# Patient Record
Sex: Male | Born: 1935 | Race: White | Hispanic: No | Marital: Married | Smoking: Former smoker
Health system: Southern US, Community
[De-identification: ages and names within clinical notes are randomized; demographics above are authoritative.]

## PROBLEM LIST (undated history)

## (undated) DIAGNOSIS — F418 Other specified anxiety disorders: Secondary | ICD-10-CM

## (undated) DIAGNOSIS — H9190 Unspecified hearing loss, unspecified ear: Secondary | ICD-10-CM

## (undated) DIAGNOSIS — F410 Panic disorder [episodic paroxysmal anxiety] without agoraphobia: Secondary | ICD-10-CM

## (undated) DIAGNOSIS — K922 Gastrointestinal hemorrhage, unspecified: Secondary | ICD-10-CM

## (undated) DIAGNOSIS — F1020 Alcohol dependence, uncomplicated: Secondary | ICD-10-CM

## (undated) DIAGNOSIS — I2510 Atherosclerotic heart disease of native coronary artery without angina pectoris: Secondary | ICD-10-CM

## (undated) DIAGNOSIS — K315 Obstruction of duodenum: Secondary | ICD-10-CM

## (undated) DIAGNOSIS — I719 Aortic aneurysm of unspecified site, without rupture: Secondary | ICD-10-CM

## (undated) DIAGNOSIS — K5730 Diverticulosis of large intestine without perforation or abscess without bleeding: Secondary | ICD-10-CM

## (undated) DIAGNOSIS — M1990 Unspecified osteoarthritis, unspecified site: Secondary | ICD-10-CM

## (undated) DIAGNOSIS — K219 Gastro-esophageal reflux disease without esophagitis: Secondary | ICD-10-CM

## (undated) DIAGNOSIS — L570 Actinic keratosis: Secondary | ICD-10-CM

## (undated) DIAGNOSIS — N200 Calculus of kidney: Secondary | ICD-10-CM

## (undated) DIAGNOSIS — D126 Benign neoplasm of colon, unspecified: Secondary | ICD-10-CM

## (undated) DIAGNOSIS — F419 Anxiety disorder, unspecified: Secondary | ICD-10-CM

## (undated) DIAGNOSIS — I714 Abdominal aortic aneurysm, without rupture, unspecified: Secondary | ICD-10-CM

## (undated) DIAGNOSIS — I219 Acute myocardial infarction, unspecified: Secondary | ICD-10-CM

## (undated) DIAGNOSIS — R760 Raised antibody titer: Secondary | ICD-10-CM

## (undated) DIAGNOSIS — R571 Hypovolemic shock: Secondary | ICD-10-CM

## (undated) DIAGNOSIS — I2109 ST elevation (STEMI) myocardial infarction involving other coronary artery of anterior wall: Secondary | ICD-10-CM

## (undated) DIAGNOSIS — I5020 Unspecified systolic (congestive) heart failure: Secondary | ICD-10-CM

## (undated) DIAGNOSIS — K2970 Gastritis, unspecified, without bleeding: Secondary | ICD-10-CM

## (undated) DIAGNOSIS — E785 Hyperlipidemia, unspecified: Secondary | ICD-10-CM

## (undated) DIAGNOSIS — I10 Essential (primary) hypertension: Secondary | ICD-10-CM

## (undated) HISTORY — DX: Obstruction of duodenum: K31.5

## (undated) HISTORY — DX: Essential (primary) hypertension: I10

## (undated) HISTORY — PX: CORONARY ANGIOPLASTY: SHX604

## (undated) HISTORY — DX: Gastritis, unspecified, without bleeding: K29.70

## (undated) HISTORY — DX: ST elevation (STEMI) myocardial infarction involving other coronary artery of anterior wall: I21.09

## (undated) HISTORY — DX: Alcohol dependence, uncomplicated: F10.20

## (undated) HISTORY — DX: Unspecified osteoarthritis, unspecified site: M19.90

## (undated) HISTORY — DX: Acute myocardial infarction, unspecified: I21.9

## (undated) HISTORY — DX: Anxiety disorder, unspecified: F41.9

## (undated) HISTORY — DX: Actinic keratosis: L57.0

## (undated) HISTORY — DX: Hyperlipidemia, unspecified: E78.5

## (undated) HISTORY — DX: Abdominal aortic aneurysm, without rupture: I71.4

## (undated) HISTORY — DX: Hypovolemic shock: R57.1

## (undated) HISTORY — DX: Calculus of kidney: N20.0

## (undated) HISTORY — DX: Gastrointestinal hemorrhage, unspecified: K92.2

## (undated) HISTORY — DX: Abdominal aortic aneurysm, without rupture, unspecified: I71.40

---

## 2006-07-24 ENCOUNTER — Ambulatory Visit: Payer: Self-pay | Admitting: Gastroenterology

## 2007-10-13 ENCOUNTER — Other Ambulatory Visit: Payer: Self-pay

## 2007-10-13 ENCOUNTER — Inpatient Hospital Stay: Payer: Self-pay | Admitting: Internal Medicine

## 2008-08-20 ENCOUNTER — Inpatient Hospital Stay: Payer: Self-pay | Admitting: Psychiatry

## 2009-10-13 DIAGNOSIS — K5730 Diverticulosis of large intestine without perforation or abscess without bleeding: Secondary | ICD-10-CM

## 2009-10-13 HISTORY — DX: Diverticulosis of large intestine without perforation or abscess without bleeding: K57.30

## 2009-10-18 ENCOUNTER — Ambulatory Visit: Payer: Self-pay | Admitting: Gastroenterology

## 2011-04-13 ENCOUNTER — Inpatient Hospital Stay: Payer: Self-pay | Admitting: Psychiatry

## 2011-08-09 ENCOUNTER — Inpatient Hospital Stay: Payer: Self-pay | Admitting: Psychiatry

## 2011-08-15 ENCOUNTER — Ambulatory Visit: Payer: Self-pay | Admitting: Unknown Physician Specialty

## 2011-09-14 ENCOUNTER — Ambulatory Visit: Payer: Self-pay | Admitting: Unknown Physician Specialty

## 2011-12-13 ENCOUNTER — Ambulatory Visit: Payer: Self-pay

## 2012-01-30 ENCOUNTER — Encounter: Payer: Self-pay | Admitting: Internal Medicine

## 2012-02-15 ENCOUNTER — Encounter: Payer: Self-pay | Admitting: Internal Medicine

## 2012-02-16 ENCOUNTER — Encounter: Payer: Self-pay | Admitting: Internal Medicine

## 2012-02-21 ENCOUNTER — Ambulatory Visit (INDEPENDENT_AMBULATORY_CARE_PROVIDER_SITE_OTHER): Payer: Medicare Other | Admitting: Internal Medicine

## 2012-02-21 ENCOUNTER — Encounter: Payer: Self-pay | Admitting: Internal Medicine

## 2012-02-21 VITALS — BP 110/72 | HR 110 | Ht 68.0 in | Wt 149.0 lb

## 2012-02-21 DIAGNOSIS — I129 Hypertensive chronic kidney disease with stage 1 through stage 4 chronic kidney disease, or unspecified chronic kidney disease: Secondary | ICD-10-CM | POA: Insufficient documentation

## 2012-02-21 DIAGNOSIS — E7800 Pure hypercholesterolemia, unspecified: Secondary | ICD-10-CM | POA: Insufficient documentation

## 2012-02-21 DIAGNOSIS — R1013 Epigastric pain: Secondary | ICD-10-CM

## 2012-02-21 DIAGNOSIS — Z86010 Personal history of colonic polyps: Secondary | ICD-10-CM

## 2012-02-21 DIAGNOSIS — N200 Calculus of kidney: Secondary | ICD-10-CM | POA: Insufficient documentation

## 2012-02-21 DIAGNOSIS — F329 Major depressive disorder, single episode, unspecified: Secondary | ICD-10-CM | POA: Insufficient documentation

## 2012-02-21 DIAGNOSIS — F419 Anxiety disorder, unspecified: Secondary | ICD-10-CM | POA: Insufficient documentation

## 2012-02-21 DIAGNOSIS — F32A Depression, unspecified: Secondary | ICD-10-CM | POA: Insufficient documentation

## 2012-02-21 DIAGNOSIS — K3189 Other diseases of stomach and duodenum: Secondary | ICD-10-CM

## 2012-02-21 DIAGNOSIS — K219 Gastro-esophageal reflux disease without esophagitis: Secondary | ICD-10-CM | POA: Insufficient documentation

## 2012-02-21 DIAGNOSIS — A048 Other specified bacterial intestinal infections: Secondary | ICD-10-CM | POA: Insufficient documentation

## 2012-02-21 MED ORDER — OMEPRAZOLE 20 MG PO CPDR: 20.0000 mg | DELAYED_RELEASE_CAPSULE | Freq: Every day | ORAL | Status: DC

## 2012-02-21 NOTE — Patient Instructions (Signed)
You have been scheduled for a colonoscopy with propofol. Please follow written instructions given to you at your visit today.  Please pick up your prep kit at the pharmacy within the next 1-3 days.  Once you have completed your H.Pylori therapy, start taking Omepraole 20mg . Daily.  Which a prescription was sent into your pharmacy.

## 2012-02-21 NOTE — Progress Notes (Addendum)
Subjective:    Patient ID: Brian Brian [name], Brian [name]    DOB: 1933/01/04, 78 y.o.   MRN: [id number]  HPI Brian Brian [name] is a 78 yo Brian [name] with PMH of hypertension, hyperlipidemia, adenomatous colon polyps, depression, anxiety, and renal stones who is seen in consultation at the request of Dr. Vear Clock for evaluation of dyspepsia. The patient reports about 4 months of burning epigastric pain. This pain has been intermittent. It hurts most of the day, but seems to resolve with sleep. It seems to be worse in the a.m. It has led to anorexia and he has continued to have a poor appetite. He is trying to drink Ensure to increase his calorie intake. He has tried Alka-Seltzer with some benefit as well as baking soda. He denies heartburn. No dysphagia or odynophagia. Overall weight is stable. He saw his primary care provider for these symptoms, and blood tests revealed elevated Helicobacter pylori antibody. He was started on triple therapy yesterday. He is currently taking omeprazole 20 mg twice a day, clarithromycin 500 twice a day, and metronidazole 500 twice a day. No fevers or chills. He does note increased upper GI belching. He's had some nausea. No vomiting.  No melena or rectal bleeding.  He has had screening colonoscopy twice in the past. Last was approximately 3 years ago. He remembers having several polyps removed on first exam, colonoscopy was repeated 3 years later were no polyps were found. We have requested these records  Review of Systems As per history of present illness  Past Medical History  Diagnosis Date  . Alcoholism   . Anxiety   . Arthritis   . Colon polyps   . Depression   . Hyperlipemia   . Hypertension   . Kidney stones    Past Surgical History  Procedure Date  . None    Current Outpatient Prescriptions  Medication Sig Dispense Refill  . clarithromycin (BIAXIN) 250 MG tablet Take 250 mg by mouth 2 (two) times daily. X 14 days      . diazepam (VALIUM) 5 MG tablet Take 5 mg by mouth  every 8 (eight) hours as needed.      . hydrochlorothiazide (MICROZIDE) 12.5 MG capsule Take 12.5 mg by mouth daily.      . metroNIDAZOLE (FLAGYL) 500 MG tablet Take 500 mg by mouth 3 (three) times daily. X 14 days      . omeprazole (PRILOSEC) 20 MG capsule Take 1 capsule (20 mg total) by mouth daily.  30 capsule  4  . pravastatin (PRAVACHOL) 20 MG tablet Take 20 mg by mouth daily.      Marland Kitchen DISCONTD: omeprazole (PRILOSEC) 20 MG capsule Take 20 mg by mouth daily.       All: NKDA  History  Substance Use Topics  . Smoking status: Current Everyday Smoker    Types: Cigarettes  . Smokeless tobacco: Never Used  . Alcohol Use: No   Family History  Problem Relation Age of Onset  . Ulcers Father     stomach        Objective:   Physical Exam BP 110/72  Pulse 110  Ht 5\' 8"  (1.727 m)  Wt 149 lb (67.586 kg)  BMI 22.66 kg/m2  SpO2 95% Constitutional: Well-developed and well-nourished. No distress. HEENT: Normocephalic and atraumatic. Oropharynx is clear and moist. No oropharyngeal exudate. Conjunctivae are normal. Pupils are equal round and reactive to light. No scleral icterus. Neck: Neck supple. Trachea midline. Cardiovascular: Normal rate, regular rhythm and intact distal pulses. No  M/R/G Pulmonary/chest: Effort normal and breath sounds normal. No wheezing, rales or rhonchi. Abdominal: Soft, epigastric tenderness without rebound or guarding, nondistended. Bowel sounds active throughout. There are no masses palpable. No hepatosplenomegaly. Extremities: no clubbing, cyanosis, or edema Lymphadenopathy: No cervical adenopathy noted. Neurological: Alert and oriented to person place and time. Skin: Skin is warm and dry. No rashes noted. Psychiatric: Normal mood and affect. Behavior is normal.  Labs obtained 02/16/2012 Helicobacter pylori antibody, IgG > 8.0  Labs obtained 11/16/2011 WBC 8.0, hemoglobin 16.5, hematocrit 47.4, MCV 94.0, platelet 2:15 Total bili 0.6, AST 22, ALT 14, albumin  4.4, alkaline phosphatase 58    Assessment & Plan:  78 yo Brian [name] with PMH of hypertension, hyperlipidemia, adenomatous colon polyps, depression, anxiety, and renal stones who is seen in consultation at the request of Dr. Vear Clock for evaluation of dyspepsia  1. H. pylori associated dyspepsia -- the patient's symptoms are very consistent with ulcer-like dyspepsia. His Helicobacter pylori antibody was extremely high, which indicates infection. He has started triple therapy, which is very appropriate. Given his age and symptoms, I would like for him to complete a toy therapy and then undergo upper endoscopy. EGD will be done in about one month, at which time biopsies to be performed to confirm eradication. I would like for him to remain on omeprazole 20 mg after completing triple therapy. We discussed EGD today including the risks and benefits and he is agreeable to proceed.  2. history of adenomatous colon polyp -- we will request the records of his prior colonoscopies. He would like to repeat surveillance colonoscopy here in our practice. We will use his old records to determine when he is due for surveillance  Addendum, records received: Colonoscopy, 10/18/2009, Dr. Lutricia Feil. -- Multiple small mouth diverticula were found in the sigmoid, and descending colon, and the transverse colon and ascending colon. Preparation was fair. Examination was otherwise normal. Recommend repeat December 2015  Colonoscopy, 07/24/2006, Dr. Sharyne Peach -- diverticulosis, one 7 mm sigmoid polyp, one 10 mm sigmoid polyp. Pathology: 7 mm polyp = normal colonic mucosa, negative for dysplasia and malignancy. 10 mm polyp = tubular adenoma.  --I recommend repeat colonoscopy December 2015

## 2012-02-26 ENCOUNTER — Telehealth: Payer: Self-pay | Admitting: Internal Medicine

## 2012-02-26 NOTE — Telephone Encounter (Signed)
Received copies from  [facility].,on 02/26/12. Forwarded  7 pages to Dr. Rhea Belton ,for review.

## 2012-02-27 ENCOUNTER — Encounter: Payer: Self-pay | Admitting: Internal Medicine

## 2012-03-13 DIAGNOSIS — K2970 Gastritis, unspecified, without bleeding: Secondary | ICD-10-CM

## 2012-03-13 DIAGNOSIS — K315 Obstruction of duodenum: Secondary | ICD-10-CM

## 2012-03-13 HISTORY — DX: Gastritis, unspecified, without bleeding: K29.70

## 2012-03-13 HISTORY — DX: Obstruction of duodenum: K31.5

## 2012-03-26 ENCOUNTER — Ambulatory Visit (AMBULATORY_SURGERY_CENTER): Payer: Medicare Other | Admitting: Internal Medicine

## 2012-03-26 ENCOUNTER — Encounter: Payer: Self-pay | Admitting: Internal Medicine

## 2012-03-26 VITALS — BP 158/79 | HR 68 | Temp 97.4°F | Resp 16 | Ht 68.0 in | Wt 149.0 lb

## 2012-03-26 DIAGNOSIS — R1013 Epigastric pain: Secondary | ICD-10-CM

## 2012-03-26 DIAGNOSIS — A048 Other specified bacterial intestinal infections: Secondary | ICD-10-CM

## 2012-03-26 DIAGNOSIS — K2970 Gastritis, unspecified, without bleeding: Secondary | ICD-10-CM

## 2012-03-26 DIAGNOSIS — D1330 Benign neoplasm of unspecified part of small intestine: Secondary | ICD-10-CM

## 2012-03-26 DIAGNOSIS — K315 Obstruction of duodenum: Secondary | ICD-10-CM

## 2012-03-26 DIAGNOSIS — K3189 Other diseases of stomach and duodenum: Secondary | ICD-10-CM

## 2012-03-26 DIAGNOSIS — K2990 Gastroduodenitis, unspecified, without bleeding: Secondary | ICD-10-CM

## 2012-03-26 MED ORDER — SODIUM CHLORIDE 0.9 % IV SOLN: 500.0000 mL | INTRAVENOUS | Status: DC

## 2012-03-26 NOTE — Op Note (Addendum)
[facility] [address] [place], [place]  [address]  ENDOSCOPY PROCEDURE REPORT  PATIENT:  Brian [name], Brian [name]  MR#:  [id number] BIRTHDATE:  06/10/1933, 78 yrs. old  GENDER:  male ENDOSCOPIST:  Carie Caddy. Breea Loncar, MD Referred by:  Loma Sender, M.D. PROCEDURE DATE:  03/26/2012 PROCEDURE:  EGD with biopsy for H. pylori 19147, EGD with biopsy, 43239 ASA CLASS:  Class III INDICATIONS:  dyspepsia, history of treated h. pylori infection (diagnosis by serology) MEDICATIONS:   MAC sedation, administered by CRNA, propofol (Diprivan) 230 mg IV TOPICAL ANESTHETIC:  none  DESCRIPTION OF PROCEDURE:   After the risks benefits and alternatives of the procedure were thoroughly explained, informed consent was obtained.  The LB GIF-H180 [id number] endoscope was introduced through the mouth and advanced to the second portion of the duodenum, without limitations.  The instrument was slowly withdrawn as the mucosa was fully examined. <<PROCEDUREIMAGES>>  The esophagus and gastroesophageal junction were completely normal in appearance.  A small hiatal hernia was found.  Mild gastritis characterized by erythema was found in the body and the antrum of the stomach. Biopsies of the antrum and body of the stomach were obtained and sent to pathology.  A stenosis was found at the junction of the first and second portion of the duodenum. There was mild resistance to passage of the adult upper endoscopy, but this area was traversed. This appeared benign and is likely peptic in nature. Multiple biopsies were obtained from the stenotic area and sent to pathology.  Otherwise normal duodenum.    Retroflexed views revealed findings as previously described.    The scope was then withdrawn from the patient and the procedure completed.  COMPLICATIONS:  None  ENDOSCOPIC IMPRESSION: 1) Normal esophagus 2) Hiatal hernia 3) Mild gastritis in the body and the antrum of the stomach. Multiple biopsies performed  to confirm eradication. 4) Stenosis (felt benign and peptic in nature) at the junction of the first and second portion duodenum, which was able to be traversed.  Multiple biopsies taken at this site. 5) Otherwise normal duodenum  RECOMMENDATIONS: 1) Await pathology results 2) Continue taking your PPI (antiacid medicine) once daily. It is best to be taken 20-30 minutes prior to breakfast meal. 3) You will be contacted by my office once pathology results are available. 4) If symptoms warrant (nausea, vomiting, epigastric pain) then would consider dilation of duodenal stricture.  Carie Caddy. Rhea Belton, MD  CC:  The Patient Loma Sender, MD  n. REVISED:  03/26/2012 09:31 AM eSIGNED:   Carie Caddy. Kahlani Graber at 03/26/2012 09:31 AM  Isidoro Donning, [phone]

## 2012-03-26 NOTE — Patient Instructions (Addendum)
See Endoscopy report for findings and recommendations.  Soft diet given and explained for today.  YOU HAD AN ENDOSCOPIC PROCEDURE TODAY AT THE [facility]: Refer to the procedure report that was given to you for any specific questions about what was found during the examination.  If the procedure report does not answer your questions, please call your gastroenterologist to clarify.  If you requested that your care partner not be given the details of your procedure findings, then the procedure report has been included in a sealed envelope for you to review at your convenience later.  YOU SHOULD EXPECT: Some feelings of bloating in the abdomen. Passage of more gas than usual.  Walking can help get rid of the air that was put into your GI tract during the procedure and reduce the bloating. If you had a lower endoscopy (such as a colonoscopy or flexible sigmoidoscopy) you may notice spotting of blood in your stool or on the toilet paper. If you underwent a bowel prep for your procedure, then you may not have a normal bowel movement for a few days.  DIET: Your first meal following the procedure should be a light meal and then it is ok to progress to your normal diet.  A half-sandwich or bowl of soup is an example of a good first meal.  Heavy or fried foods are harder to digest and may make you feel nauseous or bloated.  Likewise meals heavy in dairy and vegetables can cause extra gas to form and this can also increase the bloating.  Drink plenty of fluids but you should avoid alcoholic beverages for 24 hours.  ACTIVITY: Your care partner should take you home directly after the procedure.  You should plan to take it easy, moving slowly for the rest of the day.  You can resume normal activity the day after the procedure however you should NOT DRIVE or use heavy machinery for 24 hours (because of the sedation medicines used during the test).    SYMPTOMS TO REPORT IMMEDIATELY: A gastroenterologist  can be reached at any hour.  During normal business hours, 8:30 AM to 5:00 PM Monday through Friday, call [phone].  After hours and on weekends, please call the GI answering service at [phone] who will take a message and have the physician on call contact you.   Following lower endoscopy (colonoscopy or flexible sigmoidoscopy):  Excessive amounts of blood in the stool  Significant tenderness or worsening of abdominal pains  Swelling of the abdomen that is new, acute  Fever of 100F or higher  Following upper endoscopy (EGD)  Vomiting of blood or coffee ground material  New chest pain or pain under the shoulder blades  Painful or persistently difficult swallowing  New shortness of breath  Fever of 100F or higher  Black, tarry-looking stools  FOLLOW UP: If any biopsies were taken you will be contacted by phone or by letter within the next 1-3 weeks.  Call your gastroenterologist if you have not heard about the biopsies in 3 weeks.  Our staff will call the home number listed on your records the next business day following your procedure to check on you and address any questions or concerns that you may have at that time regarding the information given to you following your procedure. This is a courtesy call and so if there is no answer at the home number and we have not heard from you through the emergency physician on call, we will assume that  you have returned to your regular daily activities without incident.  SIGNATURES/CONFIDENTIALITY: You and/or your care partner have signed paperwork which will be entered into your electronic medical record.  These signatures attest to the fact that that the information above on your After Visit Summary has been reviewed and is understood.  Full responsibility of the confidentiality of this discharge information lies with you and/or your care-partner.   Please follow all discharge instructions given to you by the recovery room nurse. If you  have any questions or problems after discharge please call one of the numbers listed above. You will receive a phone call in the am to see how you are doing and answer any questions you may have. Thank you for choosing [facility] for your health care needs.

## 2012-03-26 NOTE — Progress Notes (Signed)
Patient did not experience any of the following events: a burn prior to discharge; a fall within the facility; wrong site/side/patient/procedure/implant event; or a hospital transfer or hospital admission upon discharge from the facility. (G8907) Patient did not have preoperative order for IV antibiotic SSI prophylaxis. (G8918)  

## 2012-03-27 ENCOUNTER — Telehealth: Payer: Self-pay | Admitting: *Deleted

## 2012-03-27 NOTE — Telephone Encounter (Signed)
Left message with spouse to have pt. Call office if questions or concerns.  She advised that pt. Had left for work and was having no problems at all.

## 2012-04-03 ENCOUNTER — Encounter: Payer: Self-pay | Admitting: Internal Medicine

## 2012-04-15 ENCOUNTER — Inpatient Hospital Stay: Payer: Self-pay | Admitting: Psychiatry

## 2012-04-15 LAB — COMPREHENSIVE METABOLIC PANEL
Albumin: 3.3 g/dL — ABNORMAL LOW (ref 3.4–5.0)
Alkaline Phosphatase: 59 U/L (ref 50–136)
Anion Gap: 14 (ref 7–16)
BUN: 14 mg/dL (ref 7–18)
Bilirubin,Total: 0.5 mg/dL (ref 0.2–1.0)
Chloride: 105 mmol/L (ref 98–107)
Co2: 24 mmol/L (ref 21–32)
EGFR (African American): >60
EGFR (Non-African Amer.): >60
Osmolality: 284 (ref 275–301)
SGOT(AST): 49 U/L — ABNORMAL HIGH (ref 15–37)
SGPT (ALT): 55 U/L
Sodium: 143 mmol/L (ref 136–145)
Total Protein: 6.7 g/dL (ref 6.4–8.2)

## 2012-04-15 LAB — DRUG SCREEN, URINE
Amphetamines, Ur Screen: NEGATIVE (ref ?–1000)
Benzodiazepine, Ur Scrn: POSITIVE (ref ?–200)
Cannabinoid 50 Ng, Ur ~~LOC~~: NEGATIVE (ref ?–50)
MDMA (Ecstasy)Ur Screen: NEGATIVE (ref ?–500)
Methadone, Ur Screen: NEGATIVE (ref ?–300)
Opiate, Ur Screen: NEGATIVE (ref ?–300)
Phencyclidine (PCP) Ur S: NEGATIVE (ref ?–25)

## 2012-04-15 LAB — CBC
HGB: 15.2 g/dL (ref 13.0–18.0)
MCH: 32.2 pg (ref 26.0–34.0)
MCHC: 33.7 g/dL (ref 32.0–36.0)
MCV: 96 fL (ref 80–100)
RBC: 4.74 10*6/uL (ref 4.40–5.90)
RDW: 13.5 % (ref 11.5–14.5)

## 2012-04-15 LAB — TSH: Thyroid Stimulating Horm: 2.32 u[IU]/mL

## 2012-04-15 LAB — ACETAMINOPHEN LEVEL: Acetaminophen: 8 ug/mL — ABNORMAL LOW

## 2012-04-15 LAB — ETHANOL: Ethanol %: 0.287 % — ABNORMAL HIGH (ref 0.000–0.080)

## 2012-04-15 LAB — SALICYLATE LEVEL: Salicylates, Serum: <1.7 mg/dL

## 2012-04-18 LAB — LIPID PANEL
Triglycerides: 149 mg/dL (ref 0–200)
VLDL Cholesterol, Calc: 30 mg/dL (ref 5–40)

## 2012-04-22 LAB — URINALYSIS, COMPLETE
Bacteria: NONE SEEN
Bilirubin,UR: NEGATIVE
Glucose,UR: NEGATIVE mg/dL (ref 0–75)
Ketone: NEGATIVE
Leukocyte Esterase: NEGATIVE
Nitrite: NEGATIVE
Protein: NEGATIVE
RBC,UR: 5 /HPF (ref 0–5)
Specific Gravity: 1.013 (ref 1.003–1.030)
Squamous Epithelial: NONE SEEN

## 2012-04-23 ENCOUNTER — Ambulatory Visit: Payer: Self-pay | Admitting: Unknown Physician Specialty

## 2012-05-13 ENCOUNTER — Ambulatory Visit: Payer: Self-pay | Admitting: Unknown Physician Specialty

## 2012-11-15 ENCOUNTER — Encounter: Payer: Self-pay | Admitting: Internal Medicine

## 2012-11-15 ENCOUNTER — Ambulatory Visit (INDEPENDENT_AMBULATORY_CARE_PROVIDER_SITE_OTHER): Payer: Medicare Other | Admitting: Internal Medicine

## 2012-11-15 VITALS — BP 120/82 | HR 70 | Ht 68.0 in | Wt 156.8 lb

## 2012-11-15 DIAGNOSIS — K3189 Other diseases of stomach and duodenum: Secondary | ICD-10-CM

## 2012-11-15 DIAGNOSIS — K2990 Gastroduodenitis, unspecified, without bleeding: Secondary | ICD-10-CM

## 2012-11-15 DIAGNOSIS — R1013 Epigastric pain: Secondary | ICD-10-CM

## 2012-11-15 DIAGNOSIS — R110 Nausea: Secondary | ICD-10-CM

## 2012-11-15 DIAGNOSIS — K219 Gastro-esophageal reflux disease without esophagitis: Secondary | ICD-10-CM

## 2012-11-15 DIAGNOSIS — K2970 Gastritis, unspecified, without bleeding: Secondary | ICD-10-CM

## 2012-11-15 MED ORDER — PANTOPRAZOLE SODIUM 40 MG PO TBEC: 40.0000 mg | DELAYED_RELEASE_TABLET | Freq: Two times a day (BID) | ORAL | Status: DC

## 2012-11-15 MED ORDER — OMEPRAZOLE 40 MG PO CPDR: 40.0000 mg | DELAYED_RELEASE_CAPSULE | Freq: Every day | ORAL | Status: DC

## 2012-11-15 MED ORDER — SUCRALFATE 1 G PO TABS: 1.0000 g | ORAL_TABLET | Freq: Four times a day (QID) | ORAL | Status: DC

## 2012-11-15 NOTE — Patient Instructions (Addendum)
We have sent the following medications to your pharmacy for you to pick up at your convenience: Protonix  Twice a day Carafate 1 tablet before every meal and before bedtime  Follow up with Dr. Rhea Belton in 6 weeks

## 2012-11-15 NOTE — Progress Notes (Signed)
Patient ID: Brian [name], male   DOB: [birth year], 79 y.o.   MRN: [id number]  SUBJECTIVE: HPI Brian [name] is a 79 year old male with a past medical history of alcohol use/abuse, colon polyps, depression, hypertension, hyperlipidemia, AAA, H. 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 He was initially seen around May 2013 when he was found to have a very high H. pylori serum antibody. He was treated with triple therapy which helped his epigastric abdominal pain and nausea greatly. He underwent upper endoscopy after completion of therapy on 03/26/2012 which revealed mild antral gastritis and a duodenal stricture which was able to be traversed at the D1-D2 junction.  Biopsies of the duodenal stricture were benign, and gastric biopsies revealed scattered metaplasia without dysplasia or H. Pylori.  Since last being seen at the time of his endoscopy he's had on and off return of his nausea and decreased appetite. He has been maintained on PPI, and is now on Prilosec 20 mg daily. It also sounds like he was retreated for H. pylori empirically. He continues to report on and off "trouble with his stomach". He reports nausea with a decreased appetite. He does not have vomiting. He's been eating very bland foods such as bananas and fruit, and supplementing his diet with ensure. He reports his energy levels wax and wane. He denies chest pain or dyspnea. He reports normal bowel movements which occasionally are dark which she relates to Pepto-Bismol. He is still using Pepto-Bismol, occasional Alka-Seltzer, and sodium bicarbonate for his "stomach symptoms".  No fevers or chills.  He does try to avoid NSAIDs and alcohol  Review of Systems  As per history of present illness, otherwise negative   Past Medical History  Diagnosis Date  . Alcoholism   . Anxiety   . Arthritis   . Colon polyps   . Depression   . Hyperlipemia   . Hypertension   . Kidney stones     . Diverticulosis   . AAA (abdominal aortic aneurysm)   . Gastritis   . Hiatal hernia   . Duodenal stenosis     Current Outpatient Prescriptions  Medication Sig Dispense Refill  . clarithromycin (BIAXIN) 250 MG tablet Take 250 mg by mouth 2 (two) times daily. X 14 days      . diazepam (VALIUM) 5 MG tablet Take 5 mg by mouth every 8 (eight) hours as needed.      . hydrochlorothiazide (MICROZIDE) 12.5 MG capsule Take 12.5 mg by mouth daily.      . metroNIDAZOLE (FLAGYL) 500 MG tablet Take 500 mg by mouth 3 (three) times daily. X 14 days      . pravastatin (PRAVACHOL) 20 MG tablet Take 20 mg by mouth daily.      . pantoprazole (PROTONIX) 40 MG tablet Take 1 tablet (40 mg total) by mouth 2 (two) times daily.  60 tablet  3  . sucralfate (CARAFATE) 1 G tablet Take 1 tablet (1 g total) by mouth 4 (four) times daily.  120 tablet  1    No Known Allergies  Family History  Problem Relation Age of Onset  . Ulcers Father     stomach   . Colon cancer Neg Hx     History  Substance Use Topics  . Smoking status: Current Every Day Smoker -- 0.5 packs/day for 60 years    Types: Cigarettes  . Smokeless tobacco: Never Used  . Alcohol Use: No    OBJECTIVE:  BP 120/82  Pulse 70  Ht 5\' 8"  (1.727 m)  Wt 156 lb 12.8 oz (71.124 kg)  BMI 23.84 kg/m2 Constitutional: Well-developed and well-nourished. No distress. HEENT: Normocephalic and atraumatic. Oropharynx is clear and moist. No oropharyngeal exudate. Conjunctivae are normal. No scleral icterus. Neck: Neck supple. Trachea midline. Cardiovascular: Normal rate, regular rhythm and intact distal pulses. No M/R/G Pulmonary/chest: Effort normal and breath sounds normal. No wheezing, rales or rhonchi. Abdominal: Soft, nontender, nondistended. Bowel sounds active throughout. There are no masses palpable. No hepatosplenomegaly. Extremities: no clubbing, cyanosis, or edema Lymphadenopathy: No cervical adenopathy noted. Neurological: Alert and oriented  to person place and time. Skin: Skin is warm and dry. No rashes noted. Psychiatric: Normal mood and affect. Behavior is normal.  Labs and Imaging -- Stool guaiac test documented in PCP office note , 08/27/2012 = negative  CT scan abdomen and pelvis with contrast dated 12/13/2011 -- impression: Diverticulosis of the visualized colon, borderline infrarenal abdominal aortic aneurysm, slightly increased from prior. Left-sided nephrolithiasis without hydronephrosis  ASSESSMENT AND PLAN:  79 year old male with a past medical history of alcohol use/abuse, colon polyps, depression, hypertension, hyperlipidemia, AAA, H. 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  1. Nausea/dyspepsia -- the patient has 2 potential etiologies to his dyspeptic/upper abdominal symptoms. The most likely is felt to be gastritis, likely H. pylori negative since he has been treated and biopsies confirm eradication as recently as May 2013.  He also was empirically retreated in the fall of 2013 without improvement in symptoms. The other possible etiology is partial outlet obstruction secondary to known benign, peptic duodenal stricture. I think that if the latter were the biggest issue, his symptoms would be more constant/reproducible every day, rather than intermittent. I will try to treat him for a flare of gastritis by increasing and changing his PPI to pantoprazole 40 mg twice daily. He is instructed to take this 30 minutes before his first and last meal of the day. Also will add sucralfate 1 g 3 times a day a.c. and at bedtime.  I will see him back in 6 weeks, and if he is not improved I likely will perform an upper GI series to better evaluate his stricture and consider upper endoscopy for possible dilation of said stricture. He is happy with this plan. I've asked that he continue to avoid alcohol and NSAIDs.

## 2012-11-25 ENCOUNTER — Telehealth: Payer: Self-pay | Admitting: Internal Medicine

## 2012-11-26 ENCOUNTER — Other Ambulatory Visit: Payer: Self-pay | Admitting: Gastroenterology

## 2012-11-26 MED ORDER — ESOMEPRAZOLE MAGNESIUM 40 MG PO PACK: 40.0000 mg | PACK | Freq: Two times a day (BID) | ORAL | Status: DC

## 2012-11-26 NOTE — Telephone Encounter (Signed)
Spoek to pt's wife, I told her we will send in something else for [place], she said he was taking Omeprazole before Dr. Rhea Belton changed him to pantoprazole.  I told her I will talk to Dr. Rhea Belton and see what he suggest, and send that in. She stated understanding

## 2012-11-28 ENCOUNTER — Telehealth: Payer: Self-pay | Admitting: Internal Medicine

## 2012-11-28 MED ORDER — OMEPRAZOLE 40 MG PO CPDR: 40.0000 mg | DELAYED_RELEASE_CAPSULE | Freq: Two times a day (BID) | ORAL | Status: DC

## 2012-11-28 NOTE — Telephone Encounter (Signed)
Informed wife that we ordered another med that should be cheaper.

## 2012-11-28 NOTE — Telephone Encounter (Signed)
Nexium is too expensive, can pt change to Omeprazole 20 mg or 40 mg BID? Thanks.

## 2012-11-28 NOTE — Telephone Encounter (Signed)
Omeprazole 40 mg BID

## 2012-12-17 ENCOUNTER — Ambulatory Visit: Payer: Self-pay

## 2012-12-18 ENCOUNTER — Telehealth: Payer: Self-pay | Admitting: Internal Medicine

## 2012-12-18 DIAGNOSIS — R109 Unspecified abdominal pain: Secondary | ICD-10-CM

## 2012-12-18 NOTE — Telephone Encounter (Signed)
Lanora Manis asked that I call her mom with the appt. Informed wife, Aurea Graff of [facility] at [facility] on 12/20/12, arrive at 0815am and NPO after midnight. Wife stated understanding. He is to continue with the carafate and PPI.

## 2012-12-18 NOTE — Telephone Encounter (Signed)
UGI series 1st to evaluate possible duodenal stenosis/stricture.   EGD likely thereafter. Continue BID PPI and QID sucralfate

## 2012-12-18 NOTE — Telephone Encounter (Signed)
Pt's daughter reports pt is still being bothered by pain in his stomach; when I asked her to be more specific she states he says just his stomach. He was sent home on carafate and a PPI and she thinks he's taking those. Due to the pain, pt has resorted to drinking again. She called pt's family doc and he deferred to [place]. Pt had stopped drinking on 04/12/12 until yesterday ; she doesn't think he's had anything today. Per your last note, you mentioned repeating the EGD; please advise thanks.

## 2012-12-19 ENCOUNTER — Encounter: Payer: Self-pay | Admitting: Internal Medicine

## 2012-12-20 ENCOUNTER — Ambulatory Visit (HOSPITAL_COMMUNITY)
Admission: RE | Admit: 2012-12-20 | Discharge: 2012-12-20 | Disposition: A | Discharge status: Home / Self Care | Payer: Medicare Other | Source: Ambulatory Visit | Attending: Internal Medicine | Admitting: Internal Medicine

## 2012-12-20 DIAGNOSIS — M4120 Other idiopathic scoliosis, site unspecified: Secondary | ICD-10-CM | POA: Insufficient documentation

## 2012-12-20 DIAGNOSIS — R109 Unspecified abdominal pain: Secondary | ICD-10-CM | POA: Insufficient documentation

## 2012-12-24 ENCOUNTER — Ambulatory Visit: Payer: Medicare Other | Admitting: Internal Medicine

## 2012-12-24 ENCOUNTER — Ambulatory Visit (AMBULATORY_SURGERY_CENTER): Payer: Medicare Other | Admitting: *Deleted

## 2012-12-24 VITALS — Ht 68.0 in | Wt 154.2 lb

## 2012-12-24 DIAGNOSIS — K219 Gastro-esophageal reflux disease without esophagitis: Secondary | ICD-10-CM

## 2012-12-24 DIAGNOSIS — R110 Nausea: Secondary | ICD-10-CM

## 2013-01-01 ENCOUNTER — Encounter: Payer: Self-pay | Admitting: Internal Medicine

## 2013-01-01 ENCOUNTER — Ambulatory Visit (AMBULATORY_SURGERY_CENTER): Payer: Medicare Other | Admitting: Internal Medicine

## 2013-01-01 VITALS — BP 120/90 | HR 85 | Temp 97.0°F | Resp 17 | Ht 68.0 in | Wt 154.0 lb

## 2013-01-01 DIAGNOSIS — R1013 Epigastric pain: Secondary | ICD-10-CM

## 2013-01-01 DIAGNOSIS — R110 Nausea: Secondary | ICD-10-CM

## 2013-01-01 DIAGNOSIS — K2940 Chronic atrophic gastritis without bleeding: Secondary | ICD-10-CM

## 2013-01-01 DIAGNOSIS — K219 Gastro-esophageal reflux disease without esophagitis: Secondary | ICD-10-CM

## 2013-01-01 DIAGNOSIS — A048 Other specified bacterial intestinal infections: Secondary | ICD-10-CM

## 2013-01-01 DIAGNOSIS — K2970 Gastritis, unspecified, without bleeding: Secondary | ICD-10-CM

## 2013-01-01 DIAGNOSIS — K3189 Other diseases of stomach and duodenum: Secondary | ICD-10-CM

## 2013-01-01 MED ORDER — SODIUM CHLORIDE 0.9 % IV SOLN: 500.0000 mL | INTRAVENOUS | Status: DC

## 2013-01-01 NOTE — Progress Notes (Signed)
Patient did not experience any of the following events: a burn prior to discharge; a fall within the facility; wrong site/side/patient/procedure/implant event; or a hospital transfer or hospital admission upon discharge from the facility. (G8907) Patient did not have preoperative order for IV antibiotic SSI prophylaxis. (G8918)  

## 2013-01-01 NOTE — Op Note (Signed)
 [facility] [address] [place], [address]   ENDOSCOPY PROCEDURE REPORT  PATIENT: Brian [name], Brian [name]  MR#: [id number] BIRTHDATE: 12/01/1932 , 79  yrs. old GENDER: Male ENDOSCOPIST: Beverley Fiedler, MD PROCEDURE DATE:  01/01/2013 PROCEDURE:  EGD w/ biopsy ASA CLASS:     Class III INDICATIONS:  Epigastric pain.   Nausea. MEDICATIONS: MAC sedation, administered by CRNA and propofol (Diprivan) 100mg  IV TOPICAL ANESTHETIC: Cetacaine Spray  DESCRIPTION OF PROCEDURE: After the risks benefits and alternatives of the procedure were thoroughly explained, informed consent was obtained.  The LB GIF-H180 [id number] endoscope was introduced through the mouth and advanced to the second portion of the duodenum. Without limitations.  The instrument was slowly withdrawn as the mucosa was fully examined.     ESOPHAGUS: There was mild, LA Class A, reflux esophagitis noted. The esophagus was otherwise normal.  STOMACH: Moderate and congested gastritis (inflammation) with scattered adherent heme was found in the entire examined stomach. Multiple biopsies were performed using cold forceps.  Sample sent for histology.  Sample obtained for helicobacter pylori testing.  DUODENUM: Mild duodenal erythema/inflammation was found in the duodenal bulb, significantly improved from previous examination. The previous seen peptic narrowing at the D1 D2 junction was easily passable with significant reduction in inflammation. The duodenal mucosa showed no abnormalities in the 2nd part of the duodenum.  Retroflexed views revealed no abnormalities.     The scope was then withdrawn from the patient and the procedure completed.  COMPLICATIONS: There were no complications.  ENDOSCOPIC IMPRESSION: 1.   There was mild esophagitis noted 2.   The esophagus was otherwise normal. 3.   Gastritis (inflammation) was found in the entire examined stomach; multiple biopsies 4.   Mild duodenal inflammation  was found in the duodenal bulb 5.   The duodenal mucosa showed no abnormalities in the 2nd part of the duodenum     RECOMMENDATIONS: 1.  Await pathology results 2.  Continue current medications, with omeprazole 40 mg twice daily (best taken 30 minutes to one hour before the first and last meal of the day) 3.  Follow-up of helicobacter pylori status, treat if indicated 4.  Avoid NSAIDS and alcohol 5.  Office follow-up in about 1 month  eSigned:  Beverley Fiedler, MD 01/01/2013 10:42 AM   QI:[id number] Vear Clock, MD and The Patient  PATIENT NAME:  Brian [name], Brian [name] MR#: [id number]

## 2013-01-01 NOTE — Progress Notes (Signed)
Called to room to assist during endoscopic procedure.  Patient ID and intended procedure confirmed with present staff. Received instructions for my participation in the procedure from the performing physician.  

## 2013-01-01 NOTE — Patient Instructions (Addendum)

## 2013-01-01 NOTE — Progress Notes (Signed)
Lidocaine-40mg IV prior to Propofol InductionPropofol given over incremental dosages 

## 2013-01-02 ENCOUNTER — Telehealth: Payer: Self-pay | Admitting: *Deleted

## 2013-01-02 NOTE — Telephone Encounter (Signed)
  Follow up Call-  Call back number 01/01/2013 03/26/2012  Post procedure Call Back phone  # [phone]  Permission to leave phone message Yes Yes     No answer and answering machine did not pick up to leave message

## 2013-01-09 ENCOUNTER — Encounter: Payer: Self-pay | Admitting: Internal Medicine

## 2013-01-10 ENCOUNTER — Other Ambulatory Visit: Payer: Self-pay | Admitting: *Deleted

## 2013-01-29 ENCOUNTER — Encounter: Payer: Self-pay | Admitting: Internal Medicine

## 2013-02-03 ENCOUNTER — Encounter: Payer: Self-pay | Admitting: Internal Medicine

## 2013-02-04 ENCOUNTER — Encounter: Payer: Self-pay | Admitting: Internal Medicine

## 2013-02-04 ENCOUNTER — Ambulatory Visit (INDEPENDENT_AMBULATORY_CARE_PROVIDER_SITE_OTHER): Payer: Medicare Other | Admitting: Internal Medicine

## 2013-02-04 ENCOUNTER — Ambulatory Visit: Payer: Medicare Other | Admitting: Internal Medicine

## 2013-02-04 VITALS — BP 124/80 | HR 75 | Ht 68.0 in | Wt 151.0 lb

## 2013-02-04 DIAGNOSIS — K3189 Other diseases of stomach and duodenum: Secondary | ICD-10-CM

## 2013-02-04 DIAGNOSIS — K2990 Gastroduodenitis, unspecified, without bleeding: Secondary | ICD-10-CM

## 2013-02-04 DIAGNOSIS — R1013 Epigastric pain: Secondary | ICD-10-CM

## 2013-02-04 DIAGNOSIS — K2970 Gastritis, unspecified, without bleeding: Secondary | ICD-10-CM

## 2013-02-04 MED ORDER — OMEPRAZOLE 40 MG PO CPDR: 40.0000 mg | DELAYED_RELEASE_CAPSULE | Freq: Every day | ORAL | Status: DC

## 2013-02-04 NOTE — Progress Notes (Signed)
  Subjective:    Patient ID: Brian [name], male    DOB: 11-Apr-1933, 79 y.o.   MRN: [id number]  HPI Brian [name] is a 79 year old male with a past medical history of alcohol use/abuse, colon polyps, depression, hypertension, hyperlipidemia, AAA, H. 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 I saw him last in the office in January 2014 and then he came for upper endoscopy on 01/01/2013.  This endoscopy showed mild esophagitis in the distal esophagus, moderate and congested gastritis with adherent heme in the stomach and mild duodenal inflammation in the bulb which was significantly improved from previous examination. The biopsies taken from the stomach revealed mild chronic inactive atrophic gastritis with intestinal metaplasia, no dysplasia or malignancy. No evidence for H. pylori. The previously seen peptic stricture at the D1 D2 junction was easily passable with dramatically reduced inflammation.  He returns today and is taking omeprazole 40 mg once daily. He reports improvement in his epigastric abdominal pain and nausea. He is eating well without early satiety. He is drinking Ensure once to twice daily to help supplement his diet but is eating a normal size dinner. He denies early satiety. No significant heartburn. No dysphagia or odynophagia. He has avoided alcohol of late. No melena. He reports bowel habits regular without diarrhea or constipation   Review of Systems As per history of present illness, otherwise negative  Current Medications, Allergies, Past Medical History, Past Surgical History, Family History and Social History were reviewed in [employer] record.     Objective:   Physical Exam BP 124/80  Pulse 75  Ht 5\' 8"  (1.727 m)  Wt 151 lb (68.493 kg)  BMI 22.96 kg/m2 Constitutional: Well-developed and well-nourished. No distress.  HEENT: Normocephalic and atraumatic. Oropharynx is clear and moist. No  oropharyngeal exudate. Conjunctivae are normal. No scleral icterus.  Neck: Neck supple. Trachea midline.  Cardiovascular: Normal rate, regular rhythm and intact distal pulses. No M/R/G  Pulmonary/chest: Effort normal and breath sounds normal. No wheezing, rales or rhonchi.  Abdominal: Soft, nontender, nondistended. Bowel sounds active throughout.  Extremities: no clubbing, cyanosis, or edema  Neurological: Alert and oriented to person place and time.  Skin: Skin is warm and dry. No rashes noted.  Psychiatric: Normal mood and affect. Behavior is normal.    Assessment & Plan:  79 year old male with a past medical history of alcohol use/abuse, colon polyps, depression, hypertension, hyperlipidemia, AAA, H. 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  Dyspepsia/gastritis -- his H. pylori has resolved and this is been documented by biopsy. He does have atrophic gastritis and I think some part of his previous gastric inflammation was alcohol-related. He has discontinued alcohol altogether and is eating well without symptoms. I would like for him to continue omeprazole 40 mg daily as it is helping his symptoms. He has discontinued sucralfate and this is okay with me.  I've asked that he continue to avoid NSAIDs and alcohol.  He can be seen on an as-needed basis for this issue.

## 2013-02-04 NOTE — Patient Instructions (Addendum)
We have sent the following medications to your pharmacy for you to pick up at your convenience: Omeprazole, take 40 mg daily  Avoid alcohol  Follow up with Dr.Pyrtle as needed                                               We are excited to introduce MyChart, a new best-in-class service that provides you online access to important information in your electronic medical record. We want to make it easier for you to view your health information - all in one secure location - when and where you need it. We expect MyChart will enhance the quality of care and service we provide.  When you register for MyChart, you can:    View your test results.    Request appointments and receive appointment reminders via email.    Request medication renewals.    View your medical history, allergies, medications and immunizations.    Communicate with your physician's office through a password-protected site.    Conveniently print information such as your medication lists.  To find out if MyChart is right for you, please talk to a member of our clinical staff today. We will gladly answer your questions about this free health and wellness tool.  If you are age 77 or older and want a member of your family to have access to your record, you must provide written consent by completing a proxy form available at our office. Please speak to our clinical staff about guidelines regarding accounts for patients younger than age 63.  As you activate your MyChart account and need any technical assistance, please call the MyChart technical support line at [phone] 4048375372) or email your question to [email or web address]. If you email your question(s), please include your name, a return phone number and the best time to reach you.  If you have non-urgent health-related questions, you can send a message to our office through MyChart at [email or web address]. If you have a medical emergency, call 911.  Thank  you for using MyChart as your new health and wellness resource!   MyChart licensed from [employer],  4540-9811. Patents Pending.

## 2013-11-13 DIAGNOSIS — I219 Acute myocardial infarction, unspecified: Secondary | ICD-10-CM

## 2013-11-13 HISTORY — DX: Acute myocardial infarction, unspecified: I21.9

## 2013-11-13 HISTORY — PX: CARDIAC CATHETERIZATION: SHX172

## 2013-12-16 DIAGNOSIS — I10 Essential (primary) hypertension: Secondary | ICD-10-CM | POA: Diagnosis not present

## 2014-01-02 ENCOUNTER — Ambulatory Visit: Payer: Self-pay

## 2014-01-02 DIAGNOSIS — I5189 Other ill-defined heart diseases: Secondary | ICD-10-CM | POA: Diagnosis not present

## 2014-01-02 DIAGNOSIS — Q619 Cystic kidney disease, unspecified: Secondary | ICD-10-CM | POA: Diagnosis not present

## 2014-01-02 DIAGNOSIS — I714 Abdominal aortic aneurysm, without rupture, unspecified: Secondary | ICD-10-CM | POA: Diagnosis not present

## 2014-02-23 DIAGNOSIS — L821 Other seborrheic keratosis: Secondary | ICD-10-CM | POA: Diagnosis not present

## 2014-02-23 DIAGNOSIS — L723 Sebaceous cyst: Secondary | ICD-10-CM | POA: Diagnosis not present

## 2014-02-23 DIAGNOSIS — Q828 Other specified congenital malformations of skin: Secondary | ICD-10-CM | POA: Diagnosis not present

## 2014-03-24 DIAGNOSIS — K208 Other esophagitis without bleeding: Secondary | ICD-10-CM | POA: Diagnosis not present

## 2014-03-24 DIAGNOSIS — I10 Essential (primary) hypertension: Secondary | ICD-10-CM | POA: Diagnosis not present

## 2014-04-07 DIAGNOSIS — I10 Essential (primary) hypertension: Secondary | ICD-10-CM | POA: Diagnosis not present

## 2014-04-07 DIAGNOSIS — K219 Gastro-esophageal reflux disease without esophagitis: Secondary | ICD-10-CM | POA: Diagnosis not present

## 2014-04-07 DIAGNOSIS — E785 Hyperlipidemia, unspecified: Secondary | ICD-10-CM | POA: Diagnosis not present

## 2014-05-07 DIAGNOSIS — R112 Nausea with vomiting, unspecified: Secondary | ICD-10-CM | POA: Diagnosis not present

## 2014-05-07 DIAGNOSIS — R1084 Generalized abdominal pain: Secondary | ICD-10-CM | POA: Diagnosis not present

## 2014-05-26 DIAGNOSIS — R1084 Generalized abdominal pain: Secondary | ICD-10-CM | POA: Diagnosis not present

## 2014-05-26 DIAGNOSIS — K3184 Gastroparesis: Secondary | ICD-10-CM | POA: Diagnosis not present

## 2014-05-26 DIAGNOSIS — K208 Other esophagitis without bleeding: Secondary | ICD-10-CM | POA: Diagnosis not present

## 2014-05-29 ENCOUNTER — Encounter (HOSPITAL_COMMUNITY): Payer: Self-pay | Admitting: Anesthesiology

## 2014-05-29 ENCOUNTER — Encounter (HOSPITAL_COMMUNITY): Payer: Self-pay | Admitting: Emergency Medicine

## 2014-05-29 ENCOUNTER — Ambulatory Visit (HOSPITAL_COMMUNITY): Admit: 2014-05-29 | Payer: Self-pay | Admitting: Cardiology

## 2014-05-29 ENCOUNTER — Encounter (HOSPITAL_COMMUNITY)
Admission: EM | Disposition: A | Discharge status: Home / Self Care | Payer: Self-pay | Source: Home / Self Care | Attending: Cardiology

## 2014-05-29 ENCOUNTER — Inpatient Hospital Stay (HOSPITAL_COMMUNITY)
Admission: EM | Admit: 2014-05-29 | Discharge: 2014-06-02 | DRG: 246 | Disposition: A | Discharge status: Home / Self Care | Payer: Medicare Other | Attending: Cardiology | Admitting: Cardiology

## 2014-05-29 DIAGNOSIS — F17200 Nicotine dependence, unspecified, uncomplicated: Secondary | ICD-10-CM | POA: Diagnosis present

## 2014-05-29 DIAGNOSIS — I5041 Acute combined systolic (congestive) and diastolic (congestive) heart failure: Secondary | ICD-10-CM | POA: Diagnosis not present

## 2014-05-29 DIAGNOSIS — I10 Essential (primary) hypertension: Secondary | ICD-10-CM | POA: Diagnosis not present

## 2014-05-29 DIAGNOSIS — F411 Generalized anxiety disorder: Secondary | ICD-10-CM | POA: Diagnosis present

## 2014-05-29 DIAGNOSIS — K5730 Diverticulosis of large intestine without perforation or abscess without bleeding: Secondary | ICD-10-CM | POA: Diagnosis present

## 2014-05-29 DIAGNOSIS — I2109 ST elevation (STEMI) myocardial infarction involving other coronary artery of anterior wall: Secondary | ICD-10-CM | POA: Diagnosis present

## 2014-05-29 DIAGNOSIS — I219 Acute myocardial infarction, unspecified: Secondary | ICD-10-CM | POA: Diagnosis not present

## 2014-05-29 DIAGNOSIS — I714 Abdominal aortic aneurysm, without rupture, unspecified: Secondary | ICD-10-CM | POA: Diagnosis present

## 2014-05-29 DIAGNOSIS — F05 Delirium due to known physiological condition: Secondary | ICD-10-CM | POA: Diagnosis not present

## 2014-05-29 DIAGNOSIS — I509 Heart failure, unspecified: Secondary | ICD-10-CM | POA: Diagnosis present

## 2014-05-29 DIAGNOSIS — R079 Chest pain, unspecified: Secondary | ICD-10-CM | POA: Diagnosis not present

## 2014-05-29 DIAGNOSIS — Z860100 Personal history of colon polyps, unspecified: Secondary | ICD-10-CM

## 2014-05-29 DIAGNOSIS — K449 Diaphragmatic hernia without obstruction or gangrene: Secondary | ICD-10-CM | POA: Diagnosis present

## 2014-05-29 DIAGNOSIS — Z955 Presence of coronary angioplasty implant and graft: Secondary | ICD-10-CM

## 2014-05-29 DIAGNOSIS — I5042 Chronic combined systolic (congestive) and diastolic (congestive) heart failure: Secondary | ICD-10-CM | POA: Diagnosis not present

## 2014-05-29 DIAGNOSIS — F1021 Alcohol dependence, in remission: Secondary | ICD-10-CM

## 2014-05-29 DIAGNOSIS — E785 Hyperlipidemia, unspecified: Secondary | ICD-10-CM | POA: Diagnosis present

## 2014-05-29 DIAGNOSIS — I2510 Atherosclerotic heart disease of native coronary artery without angina pectoris: Secondary | ICD-10-CM | POA: Diagnosis not present

## 2014-05-29 DIAGNOSIS — I369 Nonrheumatic tricuspid valve disorder, unspecified: Secondary | ICD-10-CM | POA: Diagnosis not present

## 2014-05-29 DIAGNOSIS — I213 ST elevation (STEMI) myocardial infarction of unspecified site: Secondary | ICD-10-CM

## 2014-05-29 HISTORY — DX: Benign neoplasm of colon, unspecified: D12.6

## 2014-05-29 HISTORY — PX: LEFT HEART CATHETERIZATION WITH CORONARY ANGIOGRAM: SHX5451

## 2014-05-29 HISTORY — PX: PERCUTANEOUS CORONARY STENT INTERVENTION (PCI-S): SHX5485

## 2014-05-29 HISTORY — DX: ST elevation (STEMI) myocardial infarction involving other coronary artery of anterior wall: I21.09

## 2014-05-29 HISTORY — DX: Other specified anxiety disorders: F41.8

## 2014-05-29 HISTORY — DX: Diverticulosis of large intestine without perforation or abscess without bleeding: K57.30

## 2014-05-29 LAB — CBC
HEMATOCRIT: 43.2 % (ref 39.0–52.0)
HEMOGLOBIN: 14.8 g/dL (ref 13.0–17.0)
MCH: 32.4 pg (ref 26.0–34.0)
MCHC: 34.3 g/dL (ref 30.0–36.0)
MCV: 94.5 fL (ref 78.0–100.0)
Platelets: 138 10*3/uL — ABNORMAL LOW (ref 150–400)
RBC: 4.57 MIL/uL (ref 4.22–5.81)
RDW: 13.8 % (ref 11.5–15.5)
WBC: 9.2 10*3/uL (ref 4.0–10.5)

## 2014-05-29 LAB — COMPREHENSIVE METABOLIC PANEL
ALT: 14 U/L (ref 0–53)
AST: 18 U/L (ref 0–37)
Albumin: 3.7 g/dL (ref 3.5–5.2)
Alkaline Phosphatase: 58 U/L (ref 39–117)
Anion gap: 14 (ref 5–15)
BUN: 13 mg/dL (ref 6–23)
CHLORIDE: 100 meq/L (ref 96–112)
CO2: 27 mEq/L (ref 19–32)
CREATININE: 0.66 mg/dL (ref 0.50–1.35)
Calcium: 8.9 mg/dL (ref 8.4–10.5)
GFR calc Af Amer: >90 mL/min (ref >90)
GFR, EST NON AFRICAN AMERICAN: 90 mL/min — AB (ref >90)
GLUCOSE: 129 mg/dL — AB (ref 70–99)
Potassium: 3.7 mEq/L (ref 3.7–5.3)
Sodium: 141 mEq/L (ref 137–147)
Total Bilirubin: 0.3 mg/dL (ref 0.3–1.2)
Total Protein: 7 g/dL (ref 6.0–8.3)

## 2014-05-29 LAB — I-STAT TROPONIN, ED: Troponin i, poc: 0.19 ng/mL (ref 0.00–0.08)

## 2014-05-29 LAB — PROTIME-INR
INR: 1 (ref 0.00–1.49)
Prothrombin Time: 13.2 seconds (ref 11.6–15.2)

## 2014-05-29 LAB — APTT: aPTT: 33 seconds (ref 24–37)

## 2014-05-29 SURGERY — LEFT HEART CATHETERIZATION WITH CORONARY ANGIOGRAM
Anesthesia: Choice | Laterality: Bilateral

## 2014-05-29 MED ORDER — SODIUM CHLORIDE 0.9 % IV SOLN
1.7500 mg/kg/h | INTRAVENOUS | Status: DC
  Filled 2014-05-29: qty 250

## 2014-05-29 MED ORDER — ASPIRIN 81 MG PO CHEW
81.0000 mg | CHEWABLE_TABLET | Freq: Every day | ORAL | Status: DC
  Administered 2014-05-30 – 2014-06-02 (×4): 81 mg via ORAL
  Filled 2014-05-29 (×3): qty 1

## 2014-05-29 MED ORDER — TIROFIBAN HCL IV 5 MG/100ML
INTRAVENOUS | Status: AC
  Filled 2014-05-29: qty 100

## 2014-05-29 MED ORDER — TIROFIBAN HCL IV 12.5 MG/250 ML
0.1500 ug/kg/min | INTRAVENOUS | Status: AC
  Administered 2014-05-30: 0.15 ug/kg/min via INTRAVENOUS
  Filled 2014-05-29 (×2): qty 250

## 2014-05-29 MED ORDER — SODIUM CHLORIDE 0.9 % IV SOLN
INTRAVENOUS | Status: DC
  Administered 2014-05-29: 20 mL/h via INTRAVENOUS

## 2014-05-29 MED ORDER — SODIUM CHLORIDE 0.9 % IJ SOLN
3.0000 mL | Freq: Two times a day (BID) | INTRAMUSCULAR | Status: DC
  Administered 2014-05-30 – 2014-06-01 (×5): 3 mL via INTRAVENOUS

## 2014-05-29 MED ORDER — HEPARIN SODIUM (PORCINE) 5000 UNIT/ML IJ SOLN
5000.0000 [IU] | Freq: Three times a day (TID) | INTRAMUSCULAR | Status: DC
  Administered 2014-05-30 – 2014-06-02 (×11): 5000 [IU] via SUBCUTANEOUS
  Filled 2014-05-29 (×15): qty 1

## 2014-05-29 MED ORDER — PANTOPRAZOLE SODIUM 40 MG PO TBEC
40.0000 mg | DELAYED_RELEASE_TABLET | Freq: Every day | ORAL | Status: DC
  Administered 2014-05-30: 40 mg via ORAL
  Filled 2014-05-29: qty 1

## 2014-05-29 MED ORDER — NITROGLYCERIN 1 MG/10 ML FOR IR/CATH LAB
INTRA_ARTERIAL | Status: AC
  Filled 2014-05-29: qty 10

## 2014-05-29 MED ORDER — BIVALIRUDIN BOLUS VIA INFUSION
0.1000 mg/kg | Freq: Once | INTRAVENOUS | Status: DC
  Filled 2014-05-29: qty 9

## 2014-05-29 MED ORDER — VERAPAMIL HCL 2.5 MG/ML IV SOLN
INTRAVENOUS | Status: AC
  Filled 2014-05-29: qty 2

## 2014-05-29 MED ORDER — ASPIRIN 81 MG PO CHEW
CHEWABLE_TABLET | ORAL | Status: AC
  Filled 2014-05-29: qty 4

## 2014-05-29 MED ORDER — SODIUM CHLORIDE 0.9 % IJ SOLN: 3.0000 mL | INTRAMUSCULAR | Status: DC | PRN

## 2014-05-29 MED ORDER — ATORVASTATIN CALCIUM 80 MG PO TABS
80.0000 mg | ORAL_TABLET | Freq: Every day | ORAL | Status: DC
Start: 2014-05-30 — End: 2014-06-02
  Administered 2014-05-30 – 2014-06-01 (×3): 80 mg via ORAL
  Filled 2014-05-29 (×4): qty 1

## 2014-05-29 MED ORDER — SODIUM CHLORIDE 0.9 % IV SOLN: 250.0000 mL | INTRAVENOUS | Status: DC | PRN

## 2014-05-29 MED ORDER — SUCRALFATE 1 G PO TABS
1.0000 g | ORAL_TABLET | Freq: Four times a day (QID) | ORAL | Status: DC
  Administered 2014-05-30 – 2014-06-02 (×14): 1 g via ORAL
  Filled 2014-05-29 (×17): qty 1

## 2014-05-29 MED ORDER — FENTANYL CITRATE 0.05 MG/ML IJ SOLN
INTRAMUSCULAR | Status: AC
  Filled 2014-05-29: qty 2

## 2014-05-29 MED ORDER — ACETAMINOPHEN 325 MG PO TABS: 650.0000 mg | ORAL_TABLET | ORAL | Status: DC | PRN

## 2014-05-29 MED ORDER — HEPARIN SODIUM (PORCINE) 5000 UNIT/ML IJ SOLN: 60.0000 [IU]/kg | INTRAMUSCULAR | Status: DC

## 2014-05-29 MED ORDER — MORPHINE SULFATE 4 MG/ML IJ SOLN: 4.0000 mg | INTRAMUSCULAR | Status: DC | PRN

## 2014-05-29 MED ORDER — TICAGRELOR 90 MG PO TABS
90.0000 mg | ORAL_TABLET | Freq: Two times a day (BID) | ORAL | Status: DC
  Administered 2014-05-30 – 2014-06-02 (×8): 90 mg via ORAL
  Filled 2014-05-29 (×10): qty 1

## 2014-05-29 MED ORDER — DIAZEPAM 5 MG PO TABS
5.0000 mg | ORAL_TABLET | ORAL | Status: DC | PRN
  Administered 2014-05-30 – 2014-06-02 (×10): 5 mg via ORAL
  Filled 2014-05-29 (×10): qty 1

## 2014-05-29 MED ORDER — LIDOCAINE HCL (PF) 1 % IJ SOLN
INTRAMUSCULAR | Status: AC
  Filled 2014-05-29: qty 30

## 2014-05-29 MED ORDER — HEPARIN SODIUM (PORCINE) 5000 UNIT/ML IJ SOLN
4000.0000 [IU] | INTRAMUSCULAR | Status: AC
Start: 2014-05-29 — End: 2014-05-29
  Administered 2014-05-29: 4000 [IU] via INTRAVENOUS
  Filled 2014-05-29: qty 1

## 2014-05-29 MED ORDER — ZOLPIDEM TARTRATE 5 MG PO TABS
5.0000 mg | ORAL_TABLET | Freq: Every evening | ORAL | Status: DC | PRN
  Administered 2014-06-01: 5 mg via ORAL
  Filled 2014-05-29: qty 1

## 2014-05-29 MED ORDER — ONDANSETRON HCL 4 MG/2ML IJ SOLN: 4.0000 mg | Freq: Four times a day (QID) | INTRAMUSCULAR | Status: DC | PRN

## 2014-05-29 MED ORDER — SODIUM CHLORIDE 0.9 % IV SOLN: 1.0000 mL/kg/h | INTRAVENOUS | Status: AC

## 2014-05-29 MED ORDER — HEPARIN (PORCINE) IN NACL 2-0.9 UNIT/ML-% IJ SOLN
INTRAMUSCULAR | Status: AC
  Filled 2014-05-29: qty 1000

## 2014-05-29 MED ORDER — METOPROLOL TARTRATE 25 MG PO TABS
25.0000 mg | ORAL_TABLET | Freq: Two times a day (BID) | ORAL | Status: DC
  Administered 2014-05-30 – 2014-06-02 (×8): 25 mg via ORAL
  Filled 2014-05-29 (×9): qty 1

## 2014-05-29 MED ORDER — ASPIRIN 81 MG PO CHEW
324.0000 mg | CHEWABLE_TABLET | Freq: Once | ORAL | Status: AC
  Administered 2014-05-29: 324 mg via ORAL

## 2014-05-29 MED ORDER — HEPARIN (PORCINE) IN NACL 2-0.9 UNIT/ML-% IJ SOLN
INTRAMUSCULAR | Status: AC
  Filled 2014-05-29: qty 500

## 2014-05-29 NOTE — ED Notes (Signed)
Per medics pt took 4 baby aspirin for total 324 mg pta and medics gave ntg x3 subl [pta

## 2014-05-29 NOTE — H&P (Signed)
Brian [name] is an 80 y.o. male.   Chief Complaint: Chest pain HPI: Patient is a 80 year old Caucasian male with history of hypertension, hyperlipidemia, chronic tobacco use disorder, history of alcohol abuse, abstinent for the past 2 years who presents to [place] for evaluation of chest pain he still works Geneticist, molecular, after he returned home from work, he had chest tightness on 12:30 PM.  This was associated with nausea and also diaphoresis. Chest pain lingered on and got again worse at 3:30. Eventually he Waxing Waning and He Decided to Call the EMS. He was found to have ST elevation anteriorly with ongoing chest pain hence he'll be taken emergently to the cardiac catheterization lab.  He denies any symptoms to suggest TIA or claudication. No prior history of stroke. He denies symptoms of PAD. There is prior history of GI bleed when he was alcoholic but he remained and for the past 2 years.  Past Medical History  Diagnosis Date  . Alcoholism   . Anxiety   . Arthritis   . Colon polyps   . Depression   . Hyperlipemia   . Hypertension   . Kidney stones   . Diverticulosis   . AAA (abdominal aortic aneurysm)   . Gastritis   . Hiatal hernia   . Duodenal stenosis     Past Surgical History  Procedure Laterality Date  . None      Family History  Problem Relation Age of Onset  . Ulcers Father     stomach   . Colon cancer Neg Hx    Social History:  reports that he has been smoking Cigarettes.  He has a 30 pack-year smoking history. He has never used smokeless tobacco. He reports that he does not drink alcohol or use illicit drugs.  Allergies: No Known Allergies   (Not in a hospital admission)    Review of Systems - no recent weight changes, no palpitations, no dizziness or syncope. No dark stools or bloody stools. No history to suggest stroke or neurologic deficits not a diabetic. Smokes about half a pack of cigarettes other systems negative.   Blood pressure 135/86, pulse  60, temperature 98.4 F (36.9 C), temperature source Oral, resp. rate 19, height 5\' 8"  (1.727 m), weight 83.915 kg (185 lb), SpO2 94.00%. General appearance: alert, cooperative, appears stated age and no distress Eyes: negative findings: lids and lashes normal and conjunctivae and sclerae normal Neck: no carotid bruit, no JVD, supple, symmetrical, trachea midline and thyroid not enlarged, symmetric, no tenderness/mass/nodules Neck: JVP - normal, carotids 2+= without bruits Resp: rhonchi bibasilar Chest wall: no tenderness Cardio: regular rate and rhythm, S1, S2 normal, no murmur, click, rub or gallop and Distant heart sounds. GI: soft, non-tender; bowel sounds normal; no masses,  no organomegaly Extremities: extremities normal, atraumatic, no cyanosis or edema Pulses: 2+ and symmetric Skin: Skin color, texture, turgor normal. No rashes or lesions Neurologic: Grossly normal  Results for orders placed during the hospital encounter of 05/29/14 (from the past 48 hour(s))  CBC     Status: Abnormal   Collection Time    05/29/14  8:04 PM      Result Value Ref Range   WBC 9.2  4.0 - 10.5 K/uL   RBC 4.57  4.22 - 5.81 MIL/uL   Hemoglobin 14.8  13.0 - 17.0 g/dL   HCT 43.2  39.0 - 52.0 %   MCV 94.5  78.0 - 100.0 fL   MCH 32.4  26.0 - 34.0 pg  MCHC 34.3  30.0 - 36.0 g/dL   RDW 13.8  11.5 - 15.5 %   Platelets 138 (*) 150 - 400 K/uL   No results found.  Labs:   Lab Results  Component Value Date   WBC 9.2 05/29/2014   HGB 14.8 05/29/2014   HCT 43.2 05/29/2014   MCV 94.5 05/29/2014   PLT 138* 05/29/2014   No results found for this basename: NA, K, CL, CO2, BUN, CREATININE, CALCIUM, LABALBU, PROT, BILITOT, ALKPHOS, ALT, AST, GLUCOSE,  in the last 168 hours No results found for this basename: CKTOTAL, CKMB, CKMBINDEX, TROPONINI    Lipid Panel  No results found for this basename: chol, trig, hdl, cholhdl, vldl, ldlcalc    EKG: Normal sinus rhythm, normal intervals, anterior infarct, with Q  waves in V1 to V3 with ST elevations in V1 to V4 suggestive of acute anterior injury pattern.  Assessment/Plan 1. Acute anterior ST elevation myocardial infarction, probably late presentation with onset of chest pain at 12:30 PM 2. Hypertension 3. Hyperlipidemia 4. Tobacco use disorder. 5. History of abdominal aortic aneurysm, told to be small, recent aneurysm screening a month ago 6. Peptic ulcer disease, resolved, no further GI issues, patient abstinence from tobacco use. Patient was made p.m. and by GI in March of 2014.  Recommendation: Patient will be taken emergently to the cardiac catheterization lab to evaluate his coronary anatomy. Patient is of risks, benefits and alternatives.   Laverda Page, MD 05/29/2014, 8:18 PM [facility]. [place] Pager: [phone] Office: [phone] If no answer: Cell:  [phone]

## 2014-05-29 NOTE — ED Provider Notes (Addendum)
CSN: [id number]     Arrival date & time 05/29/14  1955 History   None    Chief Complaint  Patient presents with  . Chest Pain      The history is provided by the patient.  presents to ER with waxing and waning chest and back pain since noon today. ASA pta, nitro x 3 in route. Still with pain and pressure in his back and chest at this time. No syncope. No palpitations. No hx of CAD. +HTN, +HLD, +Tobacco, -DM. Pain is moderate in severity at this time. No fever or chills.    Past Medical History  Diagnosis Date  . Alcoholism   . Anxiety   . Arthritis   . Colon polyps   . Depression   . Hyperlipemia   . Hypertension   . Kidney stones   . Diverticulosis   . AAA (abdominal aortic aneurysm)   . Gastritis   . Hiatal hernia   . Duodenal stenosis    Past Surgical History  Procedure Laterality Date  . None     Family History  Problem Relation Age of Onset  . Ulcers Father     stomach   . Colon cancer Neg Hx    History  Substance Use Topics  . Smoking status: Current Every Day Smoker -- 0.50 packs/day for 60 years    Types: Cigarettes  . Smokeless tobacco: Never Used  . Alcohol Use: No    Review of Systems  Cardiovascular: Positive for chest pain.  All other systems reviewed and are negative.     Allergies  Review of patient's allergies indicates no known allergies.  Home Medications   Prior to Admission medications   Medication Sig Start Date End Date Taking? Authorizing Provider  diazepam (VALIUM) 5 MG tablet Take 5 mg by mouth every 8 (eight) hours as needed.    Historical Provider, MD  hydrochlorothiazide (MICROZIDE) 12.5 MG capsule Take 12.5 mg by mouth daily.    Historical Provider, MD  omeprazole (PRILOSEC) 40 MG capsule Take 1 capsule (40 mg total) by mouth daily. 02/04/13   Jerene Bears, MD  pravastatin (PRAVACHOL) 20 MG tablet Take 20 mg by mouth daily.    Historical Provider, MD  sucralfate (CARAFATE) 1 G tablet Take 1 tablet (1 g total) by mouth 4  (four) times daily. 11/15/12 11/15/13  Jerene Bears, MD   BP 143/77  Pulse 70  Temp(Src) 97.8 F (36.6 C) (Oral)  Resp 17  Ht 5\' 8"  (1.727 m)  Wt 185 lb (83.915 kg)  BMI 28.14 kg/m2  SpO2 96% Physical Exam  Nursing note and vitals reviewed. Constitutional: He is oriented to person, place, and time. He appears well-developed and well-nourished.  HENT:  Head: Normocephalic and atraumatic.  Eyes: EOM are normal.  Neck: Normal range of motion.  Cardiovascular: Normal rate, regular rhythm, normal heart sounds and intact distal pulses.   Pulmonary/Chest: Effort normal and breath sounds normal. No respiratory distress.  Abdominal: Soft. He exhibits no distension. There is no tenderness.  Musculoskeletal: Normal range of motion.  Neurological: He is alert and oriented to person, place, and time.  Skin: Skin is warm and dry.  Psychiatric: He has a normal mood and affect. Judgment normal.    ED Course  Procedures (including critical care time)  CRITICAL CARE Performed by: Hoy Morn Total critical care time: 32 Critical care time was exclusive of separately billable procedures and treating other patients. Critical care was necessary to treat or prevent  imminent or life-threatening deterioration. Critical care was time spent personally by me on the following activities: development of treatment plan with patient and/or surrogate as well as nursing, discussions with consultants, evaluation of patient's response to treatment, examination of patient, obtaining history from patient or surrogate, ordering and performing treatments and interventions, ordering and review of laboratory studies, ordering and review of radiographic studies, pulse oximetry and re-evaluation of patient's condition.  Labs Review Labs Reviewed  APTT  CBC  COMPREHENSIVE METABOLIC PANEL  PROTIME-INR  I-STAT TROPOININ, ED    Imaging Review No results found.   EKG Interpretation   Date/Time:  Friday May 29 2014 20:02:11 EDT Ventricular Rate:  63 PR Interval:  174 QRS Duration: 89 QT Interval:  444 QTC Calculation: 454 R Axis:   -56 Text Interpretation:  Sinus rhythm anterior STEMI with questionable q  waves Lateral leads are also involved no prior ecg available Confirmed by  Firman Petrow  MD, Lennette Bihari ([id number]) on 05/29/2014 8:11:58 PM      MDM   Final diagnoses:  None   To the cath lab for acute anterior STEMI. Heparin bolus now. Cardiology asking for 3 more 81mg  chewable aspirins now. Will initiate angiomax per cardiology  Cards: Dr Wylene Simmer, MD 05/29/14 2014  Hoy Morn, MD 05/29/14 2015  Hoy Morn, MD 05/29/14 2016

## 2014-05-29 NOTE — Progress Notes (Signed)
Chaplain responded to Code Stemi, escorted pt's wife to catch lab waiting area and notified physician and medical team of her location. Pt's daughter, Suanne Marker, also arrived. 3 other daughters are out of town. Chaplain provided emotional support, empathic listening, pastoral presence, hospitality, and prayer. Family aware of chaplain services and availability. Please page if needed.   Ethelene Browns [phone]

## 2014-05-29 NOTE — ED Notes (Signed)
Sudden onset pain between scapula then began sweating pain pain to chest into both shoulders since noon today

## 2014-05-29 NOTE — ED Notes (Signed)
Belonging went with pt to cath lab pt agreed all belongings there family not present

## 2014-05-29 NOTE — ED Notes (Signed)
Assisted in patient care and packaging to go to the cath lab.

## 2014-05-29 NOTE — CV Procedure (Signed)
Procedure performed:  Left heart catheterization including hemodynamic monitoring of the left ventricle, LV gram, selective right and left coronary arteriography.   Performed: Femoral arteriogram and closure of the femoral arterial access with   Perclose.  Indication patient is a 80 year-old Caucasian male with history of hypertension (), hyperlipidemia (),   who presents with anterior wall ST elevation myocardial infarction with ongoing chest discomfort.  Hence is brought to the cardiac catheterization lab to evaluate his coronary anatomy in an emergent fashion. Patient has been having ongoing chest pain since 12:30 PM this morning.  Hemodynamic data:  Left ventricular pressure was 121/15with LVEDP of 23 mm mercury. Aortic pressure was 141/89 with a mean of 113 mm mercury. There was no pressure gradient across the aortic valve.   Left ventricle: Performed in the RAO projection revealed LVEF of 30%. Mid to distal anterior anteroapical and inferoapical akinesis, mitral regurgitation could not be evaluated due to hand contrast injection.  Right coronary artery: Dominant vessel, mild diffuse disease, mild ectasia evident in the mid to distal segments. Large PL branch and a moderate size PDA. No high-grade lesion evident. He'll branch also has mild luminal irregularity. Midsegment of the right carotid has 20-30% stenosis in the PL branch has 10-15% stenosis. Left main coronary artery is moderate caliber vessel and bifurcates into circumflex and LAD.  Circumflex coronary artery: Moderate caliber vessel with midsegment again showing the anterior cruciate ligament similar to RCA. Proximal circumflex shows a 30-40% stenosis, midsegment after the ectatic segment shows a 30-40% stenosis. Small AV groove branch present. No high-grade stenosis was evident.  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 Several small septal perforators,  midsegment of the LAD has a large septal perforator which is subtotally occluded. There is a small to moderate-sized D1 and a moderate-sized D2. D2 arises from the aneurysmal dilatation in the midsegment, and has thrombus in the proximal segment in the ostium and as a 2 through 3 flow. Its a small calibered vessel. The ostial LAD has a high-grade 80% stenosis followed by aneurysm formation, diffuse decreased in the midsegment followed by a tandem aneurysm formation. After the aneurysm formation in the midsegment, the LAD subtotally occluded with a string sign and TIMI 1 flow distally.    Interventional data: Successful but suboptimal PTCA and stenting of the ostium of the LAD with implantation of a 3.0 x 12 mm promos premier deployed at 12 atmospheric pressure for 45 seconds. The proximal end and the midsegment was dilated with the same balloon at 14 atmospheric pressure to inflate and flair the distal end into the aneurysmal sac.  PTCA and stenting of the mid LAD with implantation of a 2.5 x 28 mm promos premier DES deployed at 11 atmospheric pressure for 50 seconds followed by dilating the inflow at the aneurysmal sac at 14 atmospheric pressure for 30 seconds. TIMI 3 flow was established however the TIMI-3 flow was phasic.  The tirofiban bolus was administered intracoronary due to thrombus burden in the aneurysmal sac. Small portion of the distal/Apical LAD showed distal embolization with TIMI 1 flow.  Technique: Initial attempt at obtaining arterial access through radial artery approach was extremely difficult due to severe tortuosity and inability to manipulate the diagnostic catheters in the ascending aorta. Hence the procedure was abandoned and right femoral arterial access procedure was utilized.  Under sterile precautions using a 6 French right femoral arterial access, a 6 French sheath was introduced into  the right femoral artery under fluoroscopy guidance. A 5 [place] multipurpose B2 catheter was  advanced into the ascending aorta and then into the left ventricle. Hemodynamics are analysed. LV angiogram   performed in RAO projection. Catheter pulled into the ascending aorta and right coronary artery was cannulated, then left coronary artery was cannulated using a 5 [place] JL 4 diagnostic catheter.   Catheter exchanged out of the body over J-Wire. NO immediate complications noted. Patient tolerated the procedure well.   Technique of intervention: Using a 6 [place] XB LAD 3.5 guide catheter, cougar XT 0.014 by 190 cm guidewire was utilized to cross the LAD with difficulty. A 2.5 x 12 mm Emerge balloon was utilized for backup and with moderate amount of difficulty and was able to cross the subtotally occluded mid LAD. Balloon angioplasty was performed at 14 atmospheric pressure, multiple balloon inflations were performed throughout the midsegment and also in the ostium between 30-50 seconds. After multiple inflations due to thrombus and also ulceration in the midsegment felt that the best option would be to stent this.  The anatomy was extremely difficult and aware of potential suboptimal results. We then stented the midsegment and the ostium of the LAD with implantation of the above-stated stents. Due to clot burden and significant aneurysm formation the midsegment of the LAD which could potentially have adverse outcome including acute and/or subacute stent thrombosis, I utilized Aggrastat intravenous bolus dose was changed to intracoronary bolus dose. Patient also received BRILINTA bolus dosing. Right femoral arterial access was closed with Perclose, there was evidence of small hematoma, hence prophylactically utilized a from stop that'll be used for 2 hours. 205 cc of contrast was utilized for diagnostic and interventional procedure.

## 2014-05-30 DIAGNOSIS — I369 Nonrheumatic tricuspid valve disorder, unspecified: Secondary | ICD-10-CM

## 2014-05-30 LAB — CBC
HCT: 43.5 % (ref 39.0–52.0)
Hemoglobin: 14.6 g/dL (ref 13.0–17.0)
MCH: 31.9 pg (ref 26.0–34.0)
MCHC: 33.6 g/dL (ref 30.0–36.0)
MCV: 95.2 fL (ref 78.0–100.0)
PLATELETS: 146 10*3/uL — AB (ref 150–400)
RBC: 4.57 MIL/uL (ref 4.22–5.81)
RDW: 13.7 % (ref 11.5–15.5)
WBC: 10.8 10*3/uL — AB (ref 4.0–10.5)

## 2014-05-30 LAB — BASIC METABOLIC PANEL
Anion gap: 13 (ref 5–15)
BUN: 11 mg/dL (ref 6–23)
CHLORIDE: 104 meq/L (ref 96–112)
CO2: 27 meq/L (ref 19–32)
CREATININE: 0.61 mg/dL (ref 0.50–1.35)
Calcium: 9 mg/dL (ref 8.4–10.5)
GFR calc Af Amer: >90 mL/min (ref >90)
GFR calc non Af Amer: >90 mL/min (ref >90)
GLUCOSE: 107 mg/dL — AB (ref 70–99)
Potassium: 4 mEq/L (ref 3.7–5.3)
Sodium: 144 mEq/L (ref 137–147)

## 2014-05-30 LAB — TROPONIN I
TROPONIN I: 11.05 ng/mL — AB (ref <0.30)
Troponin I: 14.01 ng/mL (ref <0.30)
Troponin I: >20 ng/mL (ref <0.30)
Troponin I: 10.79 ng/mL (ref <0.30)

## 2014-05-30 MED ORDER — BISMUTH SUBSALICYLATE 262 MG/15ML PO SUSP
30.0000 mL | ORAL | Status: DC | PRN
Start: 2014-05-30 — End: 2014-06-02
  Administered 2014-05-30: 30 mL via ORAL
  Filled 2014-05-30: qty 236

## 2014-05-30 MED ORDER — PNEUMOCOCCAL VAC POLYVALENT 25 MCG/0.5ML IJ INJ: 0.5000 mL | INJECTION | INTRAMUSCULAR | Status: DC | PRN

## 2014-05-30 MED ORDER — BIOTENE DRY MOUTH MT LIQD
15.0000 mL | Freq: Two times a day (BID) | OROMUCOSAL | Status: DC
  Administered 2014-05-30: 15 mL via OROMUCOSAL

## 2014-05-30 MED ORDER — PANTOPRAZOLE SODIUM 40 MG PO TBEC
80.0000 mg | DELAYED_RELEASE_TABLET | Freq: Every day | ORAL | Status: DC
  Administered 2014-05-31 – 2014-06-02 (×3): 80 mg via ORAL
  Filled 2014-05-30 (×3): qty 2

## 2014-05-30 NOTE — Progress Notes (Signed)
Echocardiogram 2D Echocardiogram has been performed.  Brian [name] M 05/30/2014, 4:12 PM

## 2014-05-30 NOTE — Progress Notes (Signed)
Pt is c/o upset stomach and requesting pepto. Also pt is becoming increasingly more confused and trying to get OOB. Bed alarm is set and sitter requested. MD Einar Gip made aware. New orders received.   Lorre Munroe

## 2014-05-30 NOTE — Progress Notes (Signed)
CRITICAL VALUE ALERT  Critical value received:  Troponin i = 14.01  Date of notification:  05/30/2014  Time of notification:  2:12 AM  Critical value read back:Yes.    Nurse who received alert:  Cassell Smiles, RN  MD notified (1st page):  Dr. Einar Gip  Time of first page:  2:15 AM  MD notified (2nd page):  Time of second page:  Responding MD:  [id number]  Time MD responded:  [id number]  No orders at this time.  Value expected for patient status post PCI.

## 2014-05-30 NOTE — Progress Notes (Signed)
Patient sat on side of bed because he "just had to pee" and fully empty his bladder.  Patient instructed repeatedly prior to this event to notify RN staff if he had needs.  RN in room 10 minutes prior to event and specifically asked about patient toileting.  Bed alarm on, but did not trigger sound with patient movement.   R groin vascular access site level 1 without evidence of bleeding, or new ecchymosis or hematoma.  Patient assisted back to supine position with HOB flat.  Instructed again on importance of staying in bed until bedrest complete at 0745 when he may have bathroom privileges.  Bed alarm on.   Patient expressed his fear with the situation stating, "I'm just afraid to die and leave my family."  Emotional support given.  Diazepam prn given (see MAR) per pt request and MD order.  Patient currently resting in bed and apologetic.

## 2014-05-31 LAB — LIPID PANEL
Cholesterol: 201 mg/dL — ABNORMAL HIGH (ref 0–200)
HDL: 70 mg/dL (ref >39)
LDL Cholesterol: 104 mg/dL — ABNORMAL HIGH (ref 0–99)
Total CHOL/HDL Ratio: 2.9 RATIO
Triglycerides: 134 mg/dL (ref <150)
VLDL: 27 mg/dL (ref 0–40)

## 2014-05-31 MED ORDER — ISOSORBIDE MONONITRATE ER 30 MG PO TB24
30.0000 mg | ORAL_TABLET | Freq: Every day | ORAL | Status: DC
  Administered 2014-05-31 – 2014-06-02 (×3): 30 mg via ORAL
  Filled 2014-05-31 (×3): qty 1

## 2014-05-31 NOTE — Progress Notes (Signed)
Subjective:  Patient is presently doing well.  Last night had mild confusional activity,  Was kept in the ICU overnight.  His family present at the bedside with multiple questions.  Objective:  Vital Signs in the last 24 hours: Temp:  [97.7 F (36.5 C)-98.2 F (36.8 C)] 98.2 F (36.8 C) (07/19 0743) Pulse Rate:  [70-122] 87 (07/19 1235) Resp:  [17-31] 18 (07/19 1235) BP: (85-144)/(45-98) 114/79 mmHg (07/19 1235) SpO2:  [94 %-100 %] 95 % (07/19 1235) Weight:  [87.3 kg (192 lb 7.4 oz)] 87.3 kg (192 lb 7.4 oz) (07/19 0600)  Intake/Output from previous day: 07/18 0701 - 07/19 0700 In: 30.2 [I.V.:30.2] Out: 1826 [Urine:1825; Stool:1]  Physical Exam: General appearance: alert, cooperative, appears stated age and no distress  Eyes: negative findings: lids and lashes normal and conjunctivae and sclerae normal  Neck: no carotid bruit, no JVD, supple, symmetrical, trachea midline and thyroid not enlarged, symmetric, no tenderness/mass/nodules  Neck: JVP - normal, carotids 2+= without bruits  Resp: Clear Chest wall: no tenderness  Cardio: regular rate and rhythm, S1, S2 normal, no murmur, click, rub or gallop and Distant heart sounds.  GI: soft, non-tender; bowel sounds normal; no masses, no organomegaly  Extremities: extremities normal, atraumatic, no edema, right groin ecchymosis noted. NO bruit Pulses: 2+ and symmetric  Skin: Skin color, texture, turgor normal. No rashes or lesions  Neurologic: Grossly normal   Lab Results: BMP  Recent Labs  05/29/14 2004 05/30/14 0530  NA 141 144  K 3.7 4.0  CL 100 104  CO2 27 27  GLUCOSE 129* 107*  BUN 13 11  CREATININE 0.66 0.61  CALCIUM 8.9 9.0  GFRNONAA 90* >90  GFRAA >90 >90    CBC  Recent Labs Lab 05/30/14 0530  WBC 10.8*  RBC 4.57  HGB 14.6  HCT 43.5  PLT 146*  MCV 95.2  MCH 31.9  MCHC 33.6  RDW 13.7    HEMOGLOBIN A1C No results found for this basename: HGBA1C, MPG    Cardiac Panel (last 3 results)  Recent  Labs  05/30/14 0530 05/30/14 1102 05/30/14 1628  TROPONINI >20.00* 10.79* 11.05*    BNP (last 3 results) No results found for this basename: PROBNP,  in the last 8760 hours  TSH No results found for this basename: TSH,  in the last 8760 hours  CHOLESTEROL  Recent Labs  05/31/14 1135  CHOL 201*    Hepatic Function Panel  Recent Labs  05/29/14 2004  PROT 7.0  ALBUMIN 3.7  AST 18  ALT 14  ALKPHOS 58  BILITOT 0.3    Imaging: Imaging results have been reviewed  Cardiac Studies:  EKG: 05/30/2014: Normal sinus rhythm, inferior infarct old.  Poor R progression.  No evidence of ischemia. Echocardiogram: 7/18/205: Markedly depressed left ventricular systolic function, ejection fraction 10-15% with mid to distal anterior and anteroapical, inferoapical akinesis.  No significant valvular abnormalities.  Grade 1 diastolic dysfunction.  Scheduled Meds: . aspirin  81 mg Oral Daily  . atorvastatin  80 mg Oral q1800  . heparin  5,000 Units Subcutaneous 3 times per day  . metoprolol tartrate  25 mg Oral BID  . pantoprazole  80 mg Oral Q1200  . sodium chloride  3 mL Intravenous Q12H  . sucralfate  1 g Oral QID  . ticagrelor  90 mg Oral BID   Continuous Infusions:  PRN Meds:.sodium chloride, acetaminophen, bismuth subsalicylate, diazepam, morphine injection, ondansetron (ZOFRAN) IV, pneumococcal 23 valent vaccine, sodium chloride, zolpidem  Assessment/Plan:  1. Anterior wall myocardial infarction, status post complex PTCA and stenting of the ostial and mid LAD on 05/29/2014. Successful but suboptimal PTCA and stenting of the ostium of the LAD with implantation of a 3.0 x 12 mm promos premier deployed at 12 atmospheric pressure for 45 seconds. The proximal end and the midsegment was dilated with the same balloon at 14 atmospheric pressure to inflate and flair the distal end into the aneurysmal sac 2.  Hyperlipidemia 3.  Acute systolic and diastolic heart failure due to anti-wall  myocardial infarction. 4.  Tobacco use disorder  Recommendation: Patient had mild confusional state at night due to ICU psychosis.  The patient can be transferred to telemetry today.  We will watch him for another 24 hours prior to discharge given the complexity of his coronary anatomy.  He'll benefit from cardiac rehabilitation.    Laverda Page, M.D. 05/31/2014, 12:40 PM [facility], [place] Pager: [phone] Office: [phone] If no answer: [phone]

## 2014-05-31 NOTE — Progress Notes (Signed)
Pt t/x to 2W12 on monitor without event. Pt's wife and daughter present during t/x. Jeneen Rinks RN present for tele hookup and bedside report  Lorre Munroe

## 2014-06-01 LAB — TSH: TSH: 3.43 u[IU]/mL (ref 0.350–4.500)

## 2014-06-01 LAB — POCT ACTIVATED CLOTTING TIME: Activated Clotting Time: 456 seconds

## 2014-06-01 NOTE — Progress Notes (Signed)
[phone] Many questions from pt and family. Very interested in education. Reviewed MI restrictions, stent/brilinta, risk factors, CHF booklet with zones and when to call MD. Importance of daily weights and family to buy scales. Discussed low sodium and gave handouts. Discussed smoking cessation. Pt is going to have difficult time quitting. Encouraged pt to call 1800quitnow if needed and told him about free classes at [facility]. Discussed CRP 2 and pt gave permission to refer to [employer] instruction on how to view life vest video. Daughter to put video on later. Gave brilinta packet. Understanding voiced. Graylon Good RN BSN 06/01/2014 12:17 PM

## 2014-06-01 NOTE — Progress Notes (Signed)
Subjective:  Patient is presently doing well. Due to confusion, had to have a sitter, but doing well during the day.  Objective:  Vital Signs in the last 24 hours: Temp:  [98.3 F (36.8 C)-99.6 F (37.6 C)] 98.3 F (36.8 C) (07/20 0956) Pulse Rate:  [79-104] 79 (07/20 0956) Resp:  [18-21] 18 (07/20 0956) BP: (97-104)/(62-69) 100/66 mmHg (07/20 0956) SpO2:  [94 %-96 %] 94 % (07/20 0956) Weight:  [70 kg (154 lb 5.2 oz)] 70 kg (154 lb 5.2 oz) (07/20 0500)  Intake/Output from previous day: 07/19 0701 - 07/20 0700 In: 380 [P.O.:360; I.V.:20] Out: -   Physical Exam: General appearance: alert, cooperative, appears stated age and no distress  Eyes: negative findings: lids and lashes normal and conjunctivae and sclerae normal  Neck: no carotid bruit, no JVD, supple, symmetrical, trachea midline and thyroid not enlarged, symmetric, no tenderness/mass/nodules  Neck: JVP - normal, carotids 2+= without bruits  Resp: Clear Chest wall: no tenderness  Cardio: regular rate and rhythm, S1, S2 normal, no murmur, click, rub or gallop and Distant heart sounds.  GI: soft, non-tender; bowel sounds normal; no masses, no organomegaly  Extremities: extremities normal, atraumatic, no edema, right groin ecchymosis noted. NO bruit Pulses: 2+ and symmetric  Skin: Skin color, texture, turgor normal. No rashes or lesions  Neurologic: Grossly normal   Lab Results: BMP  Recent Labs  05/29/14 2004 05/30/14 0530  NA 141 144  K 3.7 4.0  CL 100 104  CO2 27 27  GLUCOSE 129* 107*  BUN 13 11  CREATININE 0.66 0.61  CALCIUM 8.9 9.0  GFRNONAA 90* >90  GFRAA >90 >90    CBC  Recent Labs Lab 05/30/14 0530  WBC 10.8*  RBC 4.57  HGB 14.6  HCT 43.5  PLT 146*  MCV 95.2  MCH 31.9  MCHC 33.6  RDW 13.7    HEMOGLOBIN A1C No results found for this basename: HGBA1C,  MPG    Cardiac Panel (last 3 results)  Recent Labs  05/30/14 0530 05/30/14 1102 05/30/14 1628  TROPONINI >20.00* 10.79* 11.05*     BNP (last 3 results) No results found for this basename: PROBNP,  in the last 8760 hours  TSH  Recent Labs  06/01/14 0020  TSH 3.430    CHOLESTEROL  Recent Labs  05/31/14 1135  CHOL 201*    Hepatic Function Panel  Recent Labs  05/29/14 2004  PROT 7.0  ALBUMIN 3.7  AST 18  ALT 14  ALKPHOS 58  BILITOT 0.3    Imaging: Imaging results have been reviewed  Cardiac Studies:  EKG: 05/30/2014: Normal sinus rhythm, inferior infarct old.  Poor R progression.  No evidence of ischemia. Echocardiogram: 7/18/205: Markedly depressed left ventricular systolic function, ejection fraction 10-15% with mid to distal anterior and anteroapical, inferoapical akinesis.  No significant valvular abnormalities.  Grade 1 diastolic dysfunction.  Scheduled Meds: . aspirin  81 mg Oral Daily  . atorvastatin  80 mg Oral q1800  . heparin  5,000 Units Subcutaneous 3 times per day  . isosorbide mononitrate  30 mg Oral Daily  . metoprolol tartrate  25 mg Oral BID  . pantoprazole  80 mg Oral Q1200  . sodium chloride  3 mL Intravenous Q12H  . sucralfate  1 g Oral QID  . ticagrelor  90 mg Oral BID   Continuous Infusions:  PRN Meds:.sodium chloride, acetaminophen, bismuth subsalicylate, diazepam, morphine injection, ondansetron (ZOFRAN) IV, pneumococcal 23 valent vaccine, sodium chloride, zolpidem  Assessment/Plan:  1. Anterior  wall myocardial infarction, status post complex PTCA and stenting of the ostial and mid LAD on 05/29/2014. Successful but suboptimal PTCA and stenting of the ostium of the LAD with implantation of a 3.0 x 12 mm promos premier deployed at 12 atmospheric pressure for 45 seconds. The proximal end and the midsegment was dilated with the same balloon at 14 atmospheric pressure to inflate and flair the distal end into the aneurysmal sac 2.  Hyperlipidemia 3.  Acute systolic and diastolic heart failure due to anti-wall myocardial infarction. 4.  Tobacco use  disorder  Recommendation: Would like home RN visits to evaluate his medical status and to reduce readmission. Extremely high risk for readmission and sub-optimal PTCA due to his anatomy. I have discussed wiuh his family extensively regarding this.  Home in AM and would like to set prescribe LifeVest.  Laverda Page, M.D. 06/01/2014, 8:18 PM [facility] Pager: [phone] Office: [phone] If no answer: [phone]

## 2014-06-01 NOTE — Progress Notes (Signed)
CARDIAC REHAB PHASE I   PRE:  Rate/Rhythm: 101 ST  BP:  Supine: 98/70  Sitting:   Standing:    SaO2: 96%RA  MODE:  Ambulation: 760 ft   POST:  Rate/Rhythm: 107 ST, 91 SR with rest  BP:  Supine:   Sitting: 104/70  Standing:    SaO2: 92%RA 0835-0900 Pt walked 760 ft with hand held asst. Gait fairly steady. Tolerated well. No CP. To recliner after walk. Will educate when family here. Sitter in room.   Graylon Good, RN BSN  06/01/2014 8:58 AM

## 2014-06-01 NOTE — Progress Notes (Signed)
CARE MANAGEMENT NOTE 06/01/2014  Patient:  Brian [name], Brian [name]   Account Number:  [id number]  Date Initiated:  06/01/2014  Documentation initiated by:  [facility]  Subjective/Objective Assessment:   STEMI     Action/Plan:   Anticipated DC Date:  06/02/2014   Anticipated DC Plan:  [place]  CM consult      [facility] Choice  HOME HEALTH   Choice offered to / List presented to:  C-4 Adult Children        HH arranged  HH-1 RN      [place].   Status of service:  Completed, signed off Medicare Important Message given?  YES (If response is "NO", the following Medicare IM given date fields will be blank) Date Medicare IM given:  06/01/2014 Medicare IM given by:  [facility] Date Additional Medicare IM given:   Additional Medicare IM given by:    Discharge Disposition:  [place]  Per UR Regulation:    If discussed at Long Length of Stay Meetings, dates discussed:    Comments:  06/01/2014 1430 Faxed order form to Dr. Irven Shelling office. Requested [place] RN for Disease Mgmt and Medication compliance. Offered choice to pt and gave permission to speak to dtr. Dtr requested [place]. Notified [facility] of new referral for [facility] RN. [employer] rep's at MD office to obtain signed Rx. Wife signed Medicare IM, placed on chart. Provided wife with copy. Jonnie Finner RN CCM Case Mgmt [phone]  06/01/2014 1400 NCM received call from [place] rep requesting paperwork faxed to [phone]. [employer] rep spoke to Dr Irven Shelling office. Jonnie Finner RN CCM Case Mgmt phone [phone]  06/01/2014 1030 Called referral into [place] for set up for scheduled dc today. Jonnie Finner RN CCM Case Mgmt phone [phone]

## 2014-06-02 MED ORDER — TICAGRELOR 90 MG PO TABS: 90.0000 mg | ORAL_TABLET | Freq: Two times a day (BID) | ORAL | Status: DC

## 2014-06-02 MED ORDER — ATORVASTATIN CALCIUM 80 MG PO TABS: 80.0000 mg | ORAL_TABLET | Freq: Every day | ORAL | Status: DC

## 2014-06-02 MED ORDER — METOPROLOL TARTRATE 25 MG PO TABS: 25.0000 mg | ORAL_TABLET | Freq: Two times a day (BID) | ORAL | Status: DC

## 2014-06-02 MED ORDER — ASPIRIN 81 MG PO CHEW: 81.0000 mg | CHEWABLE_TABLET | Freq: Every day | ORAL | Status: DC

## 2014-06-02 NOTE — Progress Notes (Signed)
CARDIAC REHAB PHASE I   PRE:  Rate/Rhythm: 60 SR  BP:  Supine:   Sitting: 96/62  Standing:    SaO2:   MODE:  Ambulation: 900 ft   POST:  Rate/Rhythm: 104 ST  BP:  Supine:   Sitting: 110/80  Standing:    SaO2: 97%RA 2010-0712 Pt walked 900 ft on RA with hand held asst and steady gait. Tolerated well. No CP. To recliner after walk. Call bell in reach.   Graylon Good, RN BSN  06/02/2014 9:30 AM

## 2014-06-02 NOTE — Discharge Summary (Signed)
Physician Discharge Summary  Patient ID: Brian [name] MRN: [id number] DOB/AGE: 11-21-1932 80 y.o.  Admit date: 05/29/2014 Discharge date: 06/02/2014  Primary Discharge Diagnosis 1. Anterior wall myocardial infarction, status post complex PTCA and stenting of the ostial and mid LAD on 05/29/2014.  Successful but suboptimal PTCA and stenting of the ostium of the LAD with implantation of a 3.0 x 12 mm promos premier deployed at 12 atmospheric pressure for 45 seconds. The proximal end and the midsegment was dilated with the same balloon at 14 atmospheric pressure to inflate and flair the distal end into the aneurysmal sac   Secondary Discharge Diagnosis 2. Hyperlipidemia  3. Acute systolic and diastolic heart failure due to anti-wall myocardial infarction.  4. Tobacco use disorder 5. Night time confusion.  Significant Diagnostic Studies: 05/29/2014: Hemodynamic data:  Left ventricular pressure was 121/15with LVEDP of 23 mm mercury. Aortic pressure was 141/89 with a mean of 113 mm mercury. There was no pressure gradient across the aortic valve.  Left ventricle: Performed in the RAO projection revealed LVEF of 30%. Mid to distal anterior anteroapical and inferoapical akinesis, mitral regurgitation could not be evaluated due to hand contrast injection.  Right coronary artery: Dominant vessel, mild diffuse disease, mild ectasia evident in the mid to distal segments. Large PL branch and a moderate size PDA. No high-grade lesion evident. He'll branch also has mild luminal irregularity. Midsegment of the right carotid has 20-30% stenosis in the PL branch has 10-15% stenosis.  Left main coronary artery is moderate caliber vessel and bifurcates into circumflex and LAD.  Circumflex coronary artery: Moderate caliber vessel with midsegment again showing the anterior cruciate ligament similar to RCA. Proximal circumflex shows a 30-40% stenosis, midsegment after the ectatic segment shows a 30-40% stenosis. Small  AV groove branch present. No high-grade stenosis was evident.  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 Several small septal perforators, midsegment of the LAD has a large septal perforator which is subtotally occluded. There is a small to moderate-sized D1 and a moderate-sized D2. D2 arises from the aneurysmal dilatation in the midsegment, and has thrombus in the proximal segment in the ostium and as a 2 through 3 flow. Its a small calibered vessel. The ostial LAD has a high-grade 80% stenosis followed by aneurysm formation, diffuse decreased in the midsegment followed by a tandem aneurysm formation. After the aneurysm formation in the midsegment, the LAD subtotally occluded with a string sign and TIMI 1 flow distally.  Interventional data: Successful but suboptimal PTCA and stenting due to severe ectasia and mid LAD aneurysmal dilatation. PTCA of the ostium of the LAD with implantation of a 3.0 x 12 mm promos premier deployed at 12 atmospheric pressure for 45 seconds. The proximal end and the midsegment was dilated with the same balloon at 14 atmospheric pressure to inflate and flair the distal end into the aneurysmal sac.  PTCA and stenting of the mid LAD with implantation of a 2.5 x 28 mm promos premier DES deployed at 11 atmospheric pressure for 50 seconds followed by dilating the inflow at the aneurysmal sac at 14 atmospheric pressure for 30 seconds. TIMI 3 flow was established however the TIMI-3 flow was phasic.  The tirofiban bolus was administered intracoronary due to thrombus burden in the aneurysmal sac. Small portion of the distal/Apical LAD showed distal embolization with TIMI 1 flow.   Hospital Course:  Patient presented late on 7/17/015, patient's chest pain started at least  8-10 hours prior to presentation to the hospital, found to have ST elevation anterior leads, was urgently taken to the catheterization lab and  underwent successful but suboptimal angioplasty to severely ectatic and diffusely diseased LAD.  He had no post procedure complication, although he had sundowning phenomena needing a sitter during the night. Otherwise doing well during the daytime, no significant arrhythmias. There was no obvious congestive heart failure, PND or orthopnea.  He was watched for 4 days in the hospital due to his age, underlying comorbidities and eventually felt stable for discharge.   Recommendations on discharge: Patient will need LiveVest, until reevaluation with echocardiogram in 3 months. His ejection fraction needs to be reevaluated. He also needs dual antiplatelet therapy, probably long-term do to high possibility that the stent struts may not be well opposed to the vessel wall due to severe ectasia. I have clearly explained this to the patient and his family, discussed regarding smoking cessation.  Discharge Exam: Blood pressure 108/71, pulse 87, temperature 98.8 F (37.1 C), temperature source Oral, resp. rate 17, height 5\' 8"  (1.727 m), weight 70.2 kg (154 lb 12.2 oz), SpO2 96.00%.   General appearance: alert, cooperative, appears stated age and no distress  Eyes: negative findings: lids and lashes normal and conjunctivae and sclerae normal  Neck: no carotid bruit, no JVD, supple, symmetrical, trachea midline and thyroid not enlarged, symmetric, no tenderness/mass/nodules  Neck: JVP - normal, carotids 2+= without bruits  Resp: Clear  Chest wall: no tenderness  Cardio: regular rate and rhythm, S1, S2 normal, no murmur, click, rub or gallop and Distant heart sounds.  GI: soft, non-tender; bowel sounds normal; no masses, no organomegaly  Extremities: extremities normal, atraumatic, no edema, right groin ecchymosis noted. NO bruit  Pulses: 2+ and symmetric  Labs:   Lab Results  Component Value Date   WBC 10.8* 05/30/2014   HGB 14.6 05/30/2014   HCT 43.5 05/30/2014   MCV 95.2 05/30/2014   PLT 146*  05/30/2014    Recent Labs Lab 05/29/14 2004 05/30/14 0530  NA 141 144  K 3.7 4.0  CL 100 104  CO2 27 27  BUN 13 11  CREATININE 0.66 0.61  CALCIUM 8.9 9.0  PROT 7.0  --   BILITOT 0.3  --   ALKPHOS 58  --   ALT 14  --   AST 18  --   GLUCOSE 129* 107*   Lab Results  Component Value Date   TROPONINI 11.05* 05/30/2014   peak troponin of greater than 20, improved after angioplasty. Lipid Panel     Component Value Date/Time   CHOL 201* 05/31/2014 1135   TRIG 134 05/31/2014 1135   HDL 70 05/31/2014 1135   CHOLHDL 2.9 05/31/2014 1135   VLDL 27 05/31/2014 1135   LDLCALC 104* 05/31/2014 1135   EKG: 05/30/2014: Normal sinus rhythm, inferior infarct old. Poor R progression. No evidence of ischemia.   Echocardiogram: 7/18/205: Markedly depressed left ventricular systolic function, ejection fraction 10-15% with mid to distal anterior and anteroapical, inferoapical akinesis. No significant valvular abnormalities. Grade 1 diastolic dysfunction.    Radiology: No results found.  FOLLOW UP PLANS AND APPOINTMENTS Discharge Instructions   Amb Referral to Cardiac Rehabilitation    Complete by:  As directed   Referring to [place] Phase 2            Medication List    STOP taking these medications       hydrochlorothiazide 12.5 MG capsule  Commonly known as:  MICROZIDE  pravastatin 20 MG tablet  Commonly known as:  PRAVACHOL      TAKE these medications       aspirin 81 MG chewable tablet  Chew 1 tablet (81 mg total) by mouth daily.     atorvastatin 80 MG tablet  Commonly known as:  LIPITOR  Take 1 tablet (80 mg total) by mouth daily at 6 PM.     diazepam 5 MG tablet  Commonly known as:  VALIUM  Take 5 mg by mouth every 8 (eight) hours as needed.     metoprolol tartrate 25 MG tablet  Commonly known as:  LOPRESSOR  Take 1 tablet (25 mg total) by mouth 2 (two) times daily.     omeprazole 40 MG capsule  Commonly known as:  PRILOSEC  Take 1 capsule (40 mg total) by mouth  daily.     ticagrelor 90 MG Tabs tablet  Commonly known as:  BRILINTA  Take 1 tablet (90 mg total) by mouth 2 (two) times daily.           Follow-up Information   Follow up with [place]. [facility] RN )    Contact information:   [address] [place] [address] [phone]       Follow up with Despina Hick, MD On 06/17/2014. (Arrive at 1:30 for 2 PM appointment.. Bring all medications.)    Specialty:  Cardiology   Contact information:   [facility], [place]. [place] [address] [phone]      Laverda Page, MD 06/02/2014, 12:07 PM  Pager: [phone] Office: [phone] If no answer: [phone]

## 2014-06-02 NOTE — Discharge Instructions (Addendum)
Acute Coronary Syndrome  Acute coronary syndrome (ACS) is an urgent problem in which the blood and oxygen supply to the heart is critically deficient. ACS requires hospitalization because one or more coronary arteries may be blocked.  ACS represents a range of conditions including:  · Previous angina that is now unstable, lasts longer, happens at rest, or is more intense.  · A heart attack, with heart muscle cell injury and death.  There are three vital coronary arteries that supply the heart muscle with blood and oxygen so that it can pump blood effectively. If blockages to these arteries develop, blood flow to the heart muscle is reduced. If the heart does not get enough blood, angina may occur as the first warning sign.  SYMPTOMS   · The most common signs of angina include:  ¨ Tightness or squeezing in the chest.  ¨ Feeling of heaviness on the chest.  ¨ Discomfort in the arms, neck, back, or jaw.  ¨ Shortness of breath and nausea.  ¨ Cold, wet skin.  · Angina is usually brought on by physical effort or excitement which increase the oxygen needs of the heart. These states increase the blood flow needs of the heart beyond what can be delivered.  · Other symptoms that are not as common include:  ¨ Fatigue  ¨ Unexplained feelings of nervousness or anxiety  ¨ Weakness  ¨ Diarrhea  · Sometimes, you may not have noticed any symptoms at all but still suffered a cardiac injury.  TREATMENT   · Medicines to help discomfort may include nitroglycerin (nitro) in the form of tablets or a spray for rapid relief, or longer-acting forms such as cream, patches, or capsules. (Be aware that there are many side effects and possible interactions with other drugs).  · Other medicines may be used to help the heart pump better.  · Procedures to open blocked arteries including angioplasty or stent placement to keep the arteries open.  · Open heart surgery may be needed when there are many blockages or they are in critical locations that  are best treated with surgery.  HOME CARE INSTRUCTIONS   · Do not use any tobacco products including cigarettes, chewing tobacco, or electronic cigarettes.  · Take one baby or adult aspirin daily, if your health care provider advises. This helps reduce the risk of a heart attack.  · It is very important that you follow the angina treatment prescribed by your health care provider. Make arrangements for proper follow-up care.  · Eat a heart healthy diet with salt and fat restrictions as advised.  · Regular exercise is good for you as long as it does not cause discomfort. Do not begin any new type of exercise until you check with your health care provider.  · If you are overweight, you should lose weight.  · Try to maintain normal blood lipid levels.  · Keep your blood pressure under control as recommended by your health care provider.  · You should tell your health care provider right away about any increase in the severity or frequency of your chest discomfort or angina attacks. When you have angina, you should stop what you are doing and sit down. This may bring relief in 3 to 5 minutes. If your health care provider has prescribed nitro, take it as directed.  · If your health care provider has given you a follow-up appointment, it is very important to keep that appointment. Not keeping the appointment could result in a chronic or   of breath.  You feel faint, lightheaded, or pass out.  Your chest discomfort gets worse.  You are sweating or experience sudden profound fatigue.  You do not get relief of your chest pain after 3 doses of nitro.  Your discomfort lasts longer than 15 minutes. MAKE SURE YOU:   Understand these instructions.  Will watch your condition.  Will get help right  away if you are not doing well or get worse.  Take all medicines as directed by your health care provider. Document Released: 10/30/2005 Document Revised: 11/04/2013 Document Reviewed: 06/02/2008 [facility] Patient Information 2015 [place], [place]. This information is not intended to replace advice given to you by your health care provider. Make sure you discuss any questions you have with your health care provider.

## 2014-06-03 DIAGNOSIS — E785 Hyperlipidemia, unspecified: Secondary | ICD-10-CM | POA: Diagnosis not present

## 2014-06-03 DIAGNOSIS — F17200 Nicotine dependence, unspecified, uncomplicated: Secondary | ICD-10-CM | POA: Diagnosis not present

## 2014-06-03 DIAGNOSIS — M159 Polyosteoarthritis, unspecified: Secondary | ICD-10-CM | POA: Diagnosis not present

## 2014-06-03 DIAGNOSIS — I10 Essential (primary) hypertension: Secondary | ICD-10-CM | POA: Diagnosis not present

## 2014-06-03 DIAGNOSIS — I2109 ST elevation (STEMI) myocardial infarction involving other coronary artery of anterior wall: Secondary | ICD-10-CM | POA: Diagnosis not present

## 2014-06-03 DIAGNOSIS — F1021 Alcohol dependence, in remission: Secondary | ICD-10-CM | POA: Diagnosis not present

## 2014-06-03 DIAGNOSIS — I509 Heart failure, unspecified: Secondary | ICD-10-CM | POA: Diagnosis not present

## 2014-06-03 DIAGNOSIS — I2510 Atherosclerotic heart disease of native coronary artery without angina pectoris: Secondary | ICD-10-CM | POA: Diagnosis not present

## 2014-06-05 DIAGNOSIS — M159 Polyosteoarthritis, unspecified: Secondary | ICD-10-CM | POA: Diagnosis not present

## 2014-06-05 DIAGNOSIS — I2109 ST elevation (STEMI) myocardial infarction involving other coronary artery of anterior wall: Secondary | ICD-10-CM | POA: Diagnosis not present

## 2014-06-05 DIAGNOSIS — I2510 Atherosclerotic heart disease of native coronary artery without angina pectoris: Secondary | ICD-10-CM | POA: Diagnosis not present

## 2014-06-05 DIAGNOSIS — I509 Heart failure, unspecified: Secondary | ICD-10-CM | POA: Diagnosis not present

## 2014-06-05 DIAGNOSIS — E785 Hyperlipidemia, unspecified: Secondary | ICD-10-CM | POA: Diagnosis not present

## 2014-06-05 DIAGNOSIS — I10 Essential (primary) hypertension: Secondary | ICD-10-CM | POA: Diagnosis not present

## 2014-06-08 DIAGNOSIS — I509 Heart failure, unspecified: Secondary | ICD-10-CM | POA: Diagnosis not present

## 2014-06-08 DIAGNOSIS — I2510 Atherosclerotic heart disease of native coronary artery without angina pectoris: Secondary | ICD-10-CM | POA: Diagnosis not present

## 2014-06-08 DIAGNOSIS — E785 Hyperlipidemia, unspecified: Secondary | ICD-10-CM | POA: Diagnosis not present

## 2014-06-08 DIAGNOSIS — M159 Polyosteoarthritis, unspecified: Secondary | ICD-10-CM | POA: Diagnosis not present

## 2014-06-08 DIAGNOSIS — I2109 ST elevation (STEMI) myocardial infarction involving other coronary artery of anterior wall: Secondary | ICD-10-CM | POA: Diagnosis not present

## 2014-06-08 DIAGNOSIS — I10 Essential (primary) hypertension: Secondary | ICD-10-CM | POA: Diagnosis not present

## 2014-06-11 DIAGNOSIS — I2510 Atherosclerotic heart disease of native coronary artery without angina pectoris: Secondary | ICD-10-CM | POA: Diagnosis not present

## 2014-06-11 DIAGNOSIS — E785 Hyperlipidemia, unspecified: Secondary | ICD-10-CM | POA: Diagnosis not present

## 2014-06-11 DIAGNOSIS — I10 Essential (primary) hypertension: Secondary | ICD-10-CM | POA: Diagnosis not present

## 2014-06-11 DIAGNOSIS — I509 Heart failure, unspecified: Secondary | ICD-10-CM | POA: Diagnosis not present

## 2014-06-11 DIAGNOSIS — I2109 ST elevation (STEMI) myocardial infarction involving other coronary artery of anterior wall: Secondary | ICD-10-CM | POA: Diagnosis not present

## 2014-06-11 DIAGNOSIS — M159 Polyosteoarthritis, unspecified: Secondary | ICD-10-CM | POA: Diagnosis not present

## 2014-06-16 DIAGNOSIS — M159 Polyosteoarthritis, unspecified: Secondary | ICD-10-CM | POA: Diagnosis not present

## 2014-06-16 DIAGNOSIS — I509 Heart failure, unspecified: Secondary | ICD-10-CM | POA: Diagnosis not present

## 2014-06-16 DIAGNOSIS — I2510 Atherosclerotic heart disease of native coronary artery without angina pectoris: Secondary | ICD-10-CM | POA: Diagnosis not present

## 2014-06-16 DIAGNOSIS — I10 Essential (primary) hypertension: Secondary | ICD-10-CM | POA: Diagnosis not present

## 2014-06-16 DIAGNOSIS — I2109 ST elevation (STEMI) myocardial infarction involving other coronary artery of anterior wall: Secondary | ICD-10-CM | POA: Diagnosis not present

## 2014-06-16 DIAGNOSIS — E785 Hyperlipidemia, unspecified: Secondary | ICD-10-CM | POA: Diagnosis not present

## 2014-06-17 DIAGNOSIS — E785 Hyperlipidemia, unspecified: Secondary | ICD-10-CM | POA: Diagnosis not present

## 2014-06-17 DIAGNOSIS — I2510 Atherosclerotic heart disease of native coronary artery without angina pectoris: Secondary | ICD-10-CM | POA: Diagnosis not present

## 2014-06-17 DIAGNOSIS — I10 Essential (primary) hypertension: Secondary | ICD-10-CM | POA: Diagnosis not present

## 2014-06-25 DIAGNOSIS — I2109 ST elevation (STEMI) myocardial infarction involving other coronary artery of anterior wall: Secondary | ICD-10-CM | POA: Diagnosis not present

## 2014-06-25 DIAGNOSIS — I509 Heart failure, unspecified: Secondary | ICD-10-CM | POA: Diagnosis not present

## 2014-06-25 DIAGNOSIS — I10 Essential (primary) hypertension: Secondary | ICD-10-CM | POA: Diagnosis not present

## 2014-06-25 DIAGNOSIS — I2510 Atherosclerotic heart disease of native coronary artery without angina pectoris: Secondary | ICD-10-CM | POA: Diagnosis not present

## 2014-06-25 DIAGNOSIS — M159 Polyosteoarthritis, unspecified: Secondary | ICD-10-CM | POA: Diagnosis not present

## 2014-06-25 DIAGNOSIS — E785 Hyperlipidemia, unspecified: Secondary | ICD-10-CM | POA: Diagnosis not present

## 2014-07-21 DIAGNOSIS — E785 Hyperlipidemia, unspecified: Secondary | ICD-10-CM | POA: Diagnosis not present

## 2014-07-21 DIAGNOSIS — I2510 Atherosclerotic heart disease of native coronary artery without angina pectoris: Secondary | ICD-10-CM | POA: Diagnosis not present

## 2014-07-21 DIAGNOSIS — I10 Essential (primary) hypertension: Secondary | ICD-10-CM | POA: Diagnosis not present

## 2014-07-24 ENCOUNTER — Encounter: Payer: Self-pay | Admitting: *Deleted

## 2014-08-13 DIAGNOSIS — I10 Essential (primary) hypertension: Secondary | ICD-10-CM | POA: Diagnosis not present

## 2014-08-13 DIAGNOSIS — Z955 Presence of coronary angioplasty implant and graft: Secondary | ICD-10-CM | POA: Diagnosis not present

## 2014-08-13 DIAGNOSIS — I2510 Atherosclerotic heart disease of native coronary artery without angina pectoris: Secondary | ICD-10-CM | POA: Diagnosis not present

## 2014-09-08 ENCOUNTER — Encounter: Payer: Self-pay | Admitting: Internal Medicine

## 2014-09-28 DIAGNOSIS — I10 Essential (primary) hypertension: Secondary | ICD-10-CM | POA: Diagnosis not present

## 2014-09-28 DIAGNOSIS — I25119 Atherosclerotic heart disease of native coronary artery with unspecified angina pectoris: Secondary | ICD-10-CM | POA: Diagnosis not present

## 2014-09-28 DIAGNOSIS — E785 Hyperlipidemia, unspecified: Secondary | ICD-10-CM | POA: Diagnosis not present

## 2014-10-01 DIAGNOSIS — E785 Hyperlipidemia, unspecified: Secondary | ICD-10-CM | POA: Diagnosis not present

## 2014-10-01 DIAGNOSIS — I10 Essential (primary) hypertension: Secondary | ICD-10-CM | POA: Diagnosis not present

## 2014-10-01 DIAGNOSIS — Z23 Encounter for immunization: Secondary | ICD-10-CM | POA: Diagnosis not present

## 2014-10-05 DIAGNOSIS — S7490XA Injury of unspecified nerve at hip and thigh level, unspecified leg, initial encounter: Secondary | ICD-10-CM | POA: Diagnosis not present

## 2014-10-22 ENCOUNTER — Encounter (HOSPITAL_COMMUNITY): Payer: Self-pay | Admitting: Cardiology

## 2014-11-30 DIAGNOSIS — E785 Hyperlipidemia, unspecified: Secondary | ICD-10-CM | POA: Diagnosis not present

## 2014-11-30 DIAGNOSIS — I10 Essential (primary) hypertension: Secondary | ICD-10-CM | POA: Diagnosis not present

## 2015-01-27 DIAGNOSIS — I10 Essential (primary) hypertension: Secondary | ICD-10-CM | POA: Diagnosis not present

## 2015-01-27 DIAGNOSIS — I2510 Atherosclerotic heart disease of native coronary artery without angina pectoris: Secondary | ICD-10-CM | POA: Diagnosis not present

## 2015-01-27 DIAGNOSIS — E785 Hyperlipidemia, unspecified: Secondary | ICD-10-CM | POA: Diagnosis not present

## 2015-03-01 DIAGNOSIS — I10 Essential (primary) hypertension: Secondary | ICD-10-CM | POA: Diagnosis not present

## 2015-03-07 NOTE — Consult Note (Signed)
Met with patient at request of attending MD to explore treatment in the [facility] after this inpatient treatment. He was oriented x4, talkative about plans to return to home with wife as well as return to work. He expressed plans to remain abstinence of alcoholic beverage and was also able to express desires of returning  to treatment in the [facility]. He was positive for past treatment in the [facility] and was able to report that he was successful at recovery until this recent return to alcoholic beverage consumption which resulted in the need for this level of inpatient  treatment and that he believes that recovery is possible again. PLANS: Return to [facility] at this discharge. Assigned Nurse informed of plans to return to [facility].   Electronic Signatures: Laqueta Due (PsyD)  (Signed on 10-Jun-13 09:54)  Authored  Last Updated: 10-Jun-13 09:54 by Laqueta Due (PsyD)

## 2015-03-07 NOTE — H&P (Signed)
PATIENT NAME:  Brian [name], Brian [name] MR#:  [id number] DATE OF BIRTH:  05-14-1933  DATE OF ADMISSION:  04/15/2012  REFERRING PHYSICIAN: Billey Gosling, MD   ATTENDING PHYSICIAN: Orson Slick, MD    IDENTIFYING DATA: Mr. Aprea is a 81 year old man with history of alcoholism and recurrent depression.   CHIEF COMPLAINT: "I need help".  HISTORY OF PRESENT ILLNESS: Mr. Edler relapsed on alcohol and has been drinking daily. For the past three days he has been consuming a larger amount of alcohol, although we were unable to find out how much, that was provided to him by his brother. He stopped eating and became confused and behaving rather poorly. His wife who gave him Valium in an attempt to calm him down. The patient started falling and was brought to the Emergency Room where he fell again. He reports good compliance with medication prescribed by Dr. Nicolasa Ducking but regrets relapsing on alcohol. He feels that it's time to take treatment again. He denies symptoms of depression or anxiety on his current regimen. There is no history of psychosis. No symptoms suggestive of bipolar mania. No other than alcohol substance use.   PAST PSYCHIATRIC HISTORY: He is a patient of Dr. Nicolasa Ducking. He has been treated with Celexa and Abilify lately. He did take Remeron in the past. He has multiple admissions to [facility] for alcohol dependence. He also received treatment at the [facility] and intensive outpatient program with Dr. Marcello Moores here.   FAMILY PSYCHIATRIC HISTORY: His mother has depression.   PAST MEDICAL HISTORY:  1. Hypertension.  2. Dyslipidemia.  3. Frequent falls including one in the Emergency Room yesterday.   ALLERGIES: No known drug allergies.   MEDICATIONS ON ADMISSION:  1. Hydrochlorothiazide 12.5 mg daily. 2. Pravastatin 20 mg at bedtime. 3. Celexa 20 mg daily.  4. Trazodone 50 mg at bedtime.  5. Abilify 2 mg daily. 6. Prilosec 40 mg daily.   SOCIAL HISTORY: The patient  has been married for 56 years. He lives with his wife. He does not drive anymore. The family, mostly his brother, provides his alcohol although the patient says that he can walk to store to pick it up. In the past he had several DUI's. No current legal charges pending.   REVIEW OF SYSTEMS: CONSTITUTIONAL: No fevers or chills. Positive for some weight loss. EYES: No double or blurred vision. ENT: Positive for hearing loss. RESPIRATORY: No shortness of breath or cough. CARDIOVASCULAR: No chest pain or orthopnea. GASTROINTESTINAL: No abdominal pain, nausea, vomiting, or diarrhea. GU: No incontinence or frequency. ENDOCRINE: No heat or cold intolerance. LYMPHATIC: No anemia or easy bruising. INTEGUMENTARY: No acne or rash. MUSCULOSKELETAL: No muscle or joint pain. NEUROLOGIC: No tingling or weakness. PSYCHIATRIC: See history of present illness for details.   PHYSICAL EXAMINATION:   VITAL SIGNS: Blood pressure 154/97, pulse 72, respirations 16, temperature 98.   GENERAL: This is a slender male in no acute distress.   HEENT: The pupils are equal, round, and reactive to light. Sclerae nonicteric.   NECK: Supple. No thyromegaly.   LUNGS: Clear to auscultation. No dullness to percussion.   HEART: Regular rhythm and rate. No murmurs, rubs, or gallops.   ABDOMEN: Soft, nontender, nondistended. Positive bowel sounds.   MUSCULOSKELETAL: Normal muscle strength in all extremities.   SKIN: No rashes or bruises.   LYMPHATIC: No cervical adenopathy.   NEUROLOGIC: Cranial nerves II through XII are intact.   LABORATORY DATA: Chemistries are within normal limits. Blood alcohol level 0.287. LFTs  within normal limits except for AST of 49. TSH 2.32. Urine tox screen positive for benzodiazepines. CBC within normal limits with low platelets of 149. Serum acetaminophen and salicylates are low.   MENTAL STATUS EXAMINATION ON ADMISSION: The patient is assessed in the Emergency Room. He is pleasant, polite, and  cooperative. Short of hearing, very difficult to communicate with. I'm not certain if he uses a hearing aid. He is on the stretcher wearing hospital scrubs and a yellow shirt. He maintains good eye contact. His speech is of normal rhythm, rate, and volume. His mood is okay but his daughter reports that he made statements at home as if he was tired of living. There are no homicidal ideation. There are no delusions or paranoia. No auditory or visual hallucinations. His cognition is difficult to assess due to hearing problems. His insight and judgment are poor.   SUICIDE RISK ASSESSMENT: This is a patient with a long history of alcoholism and recurrent depression who came to the hospital for alcohol detox but did frighten his family talking about suicide prior to admission.   DIAGNOSES: AXIS I:  1. Alcohol dependence.  2. Mood disorder, not otherwise specified.   AXIS II: Deferred.   AXIS III:  1. Dyslipidemia.  2. Hypertension.  3. Frequent falls.   AXIS IV: Substance abuse.  AXIS V: GAF on admission 25.   PLAN: The patient was admitted to [facility] for safety, stabilization, and medication management. He was initially placed on suicide precautions and was closely monitored for any unsafe behaviors. He underwent full psychiatric and risk assessment. He received pharmacotherapy, individual and group psychotherapy, substance abuse counseling, and support from therapeutic milieu.  1. Suicidal ideation. The patient denies. 2. Alcohol detox. He is already on standard CIWA protocol. Will be monitored for symptoms of alcohol withdrawal.  3. Mood. We will continue medications of Celexa and Abilify as prescribed by Dr. Nicolasa Ducking, his primary psychiatrist.  4. Medical. We will continue treatment of hypertension and dyslipidemia.  5. Risk of falls. It is unclear whether this is completely related to drunkenness. The family was giving him Valium. This certainly  did not help his problem. We will ask physical therapy for assessment.  6. Substance abuse. The patient is unable to discuss further treatment at the moment.  7. Disposition. He will be most likely discharged to home with his wife.    ____________________________ Wardell Honour. Bary Leriche, MD [id number] D: 04/15/2012 16:04:26 ET T: 04/15/2012 16:26:40 ET JOB#: [id number]  cc: Margrete Delude B. Bary Leriche, MD, <Dictator> Clovis Fredrickson MD ELECTRONICALLY SIGNED 04/16/2012 22:00

## 2015-03-19 ENCOUNTER — Encounter: Payer: Self-pay | Admitting: Internal Medicine

## 2015-05-03 ENCOUNTER — Telehealth: Payer: Self-pay | Admitting: Internal Medicine

## 2015-05-03 ENCOUNTER — Encounter: Payer: Self-pay | Admitting: *Deleted

## 2015-05-03 ENCOUNTER — Inpatient Hospital Stay
Admission: EM | Admit: 2015-05-03 | Discharge: 2015-05-04 | DRG: 378 | Disposition: A | Discharge status: Home / Self Care | Payer: Medicare Other | Attending: Internal Medicine | Admitting: Internal Medicine

## 2015-05-03 ENCOUNTER — Encounter (HOSPITAL_COMMUNITY): Payer: Self-pay | Admitting: Physician Assistant

## 2015-05-03 DIAGNOSIS — Z79899 Other long term (current) drug therapy: Secondary | ICD-10-CM

## 2015-05-03 DIAGNOSIS — Z87442 Personal history of urinary calculi: Secondary | ICD-10-CM | POA: Diagnosis not present

## 2015-05-03 DIAGNOSIS — I252 Old myocardial infarction: Secondary | ICD-10-CM | POA: Diagnosis not present

## 2015-05-03 DIAGNOSIS — I10 Essential (primary) hypertension: Secondary | ICD-10-CM | POA: Diagnosis not present

## 2015-05-03 DIAGNOSIS — Z800 Family history of malignant neoplasm of digestive organs: Secondary | ICD-10-CM

## 2015-05-03 DIAGNOSIS — K921 Melena: Secondary | ICD-10-CM | POA: Diagnosis not present

## 2015-05-03 DIAGNOSIS — M1990 Unspecified osteoarthritis, unspecified site: Secondary | ICD-10-CM | POA: Diagnosis present

## 2015-05-03 DIAGNOSIS — K922 Gastrointestinal hemorrhage, unspecified: Secondary | ICD-10-CM | POA: Diagnosis not present

## 2015-05-03 DIAGNOSIS — E785 Hyperlipidemia, unspecified: Secondary | ICD-10-CM | POA: Diagnosis present

## 2015-05-03 DIAGNOSIS — Z955 Presence of coronary angioplasty implant and graft: Secondary | ICD-10-CM | POA: Diagnosis not present

## 2015-05-03 DIAGNOSIS — F17210 Nicotine dependence, cigarettes, uncomplicated: Secondary | ICD-10-CM | POA: Diagnosis present

## 2015-05-03 DIAGNOSIS — I5022 Chronic systolic (congestive) heart failure: Secondary | ICD-10-CM | POA: Diagnosis present

## 2015-05-03 DIAGNOSIS — F418 Other specified anxiety disorders: Secondary | ICD-10-CM | POA: Diagnosis present

## 2015-05-03 DIAGNOSIS — K625 Hemorrhage of anus and rectum: Secondary | ICD-10-CM | POA: Diagnosis not present

## 2015-05-03 DIAGNOSIS — K5730 Diverticulosis of large intestine without perforation or abscess without bleeding: Secondary | ICD-10-CM | POA: Diagnosis not present

## 2015-05-03 DIAGNOSIS — K5731 Diverticulosis of large intestine without perforation or abscess with bleeding: Secondary | ICD-10-CM | POA: Diagnosis not present

## 2015-05-03 DIAGNOSIS — I2510 Atherosclerotic heart disease of native coronary artery without angina pectoris: Secondary | ICD-10-CM | POA: Diagnosis not present

## 2015-05-03 DIAGNOSIS — Z7982 Long term (current) use of aspirin: Secondary | ICD-10-CM

## 2015-05-03 DIAGNOSIS — Z8719 Personal history of other diseases of the digestive system: Secondary | ICD-10-CM

## 2015-05-03 DIAGNOSIS — Z66 Do not resuscitate: Secondary | ICD-10-CM | POA: Diagnosis present

## 2015-05-03 HISTORY — DX: Raised antibody titer: R76.0

## 2015-05-03 LAB — CBC
HCT: 42.2 % (ref 40.0–52.0)
Hemoglobin: 13.9 g/dL (ref 13.0–18.0)
MCH: 32.6 pg (ref 26.0–34.0)
MCHC: 33 g/dL (ref 32.0–36.0)
MCV: 98.9 fL (ref 80.0–100.0)
PLATELETS: 167 10*3/uL (ref 150–440)
RBC: 4.26 MIL/uL — AB (ref 4.40–5.90)
RDW: 13.9 % (ref 11.5–14.5)
WBC: 7.7 10*3/uL (ref 3.8–10.6)

## 2015-05-03 LAB — COMPREHENSIVE METABOLIC PANEL
ALBUMIN: 3.9 g/dL (ref 3.5–5.0)
ALT: 18 U/L (ref 17–63)
AST: 20 U/L (ref 15–41)
Alkaline Phosphatase: 86 U/L (ref 38–126)
Anion gap: 8 (ref 5–15)
BILIRUBIN TOTAL: 0.3 mg/dL (ref 0.3–1.2)
BUN: 14 mg/dL (ref 6–20)
CALCIUM: 9.3 mg/dL (ref 8.9–10.3)
CO2: 27 mmol/L (ref 22–32)
Chloride: 106 mmol/L (ref 101–111)
Creatinine, Ser: 0.7 mg/dL (ref 0.61–1.24)
GLUCOSE: 100 mg/dL — AB (ref 65–99)
Potassium: 4.1 mmol/L (ref 3.5–5.1)
Sodium: 141 mmol/L (ref 135–145)
TOTAL PROTEIN: 7.3 g/dL (ref 6.5–8.1)

## 2015-05-03 LAB — HEMOGLOBIN AND HEMATOCRIT, BLOOD
HCT: 37.5 % — ABNORMAL LOW (ref 40.0–52.0)
Hemoglobin: 12.6 g/dL — ABNORMAL LOW (ref 13.0–18.0)

## 2015-05-03 LAB — TYPE AND SCREEN
ABO/RH(D): A POS
Antibody Screen: NEGATIVE

## 2015-05-03 MED ORDER — ACETAMINOPHEN 325 MG PO TABS: 650.0000 mg | ORAL_TABLET | Freq: Four times a day (QID) | ORAL | Status: DC | PRN

## 2015-05-03 MED ORDER — DIAZEPAM 5 MG PO TABS
5.0000 mg | ORAL_TABLET | Freq: Three times a day (TID) | ORAL | Status: DC | PRN
  Administered 2015-05-03 – 2015-05-04 (×2): 5 mg via ORAL
  Filled 2015-05-03 (×2): qty 1

## 2015-05-03 MED ORDER — ATORVASTATIN CALCIUM 20 MG PO TABS: 80.0000 mg | ORAL_TABLET | Freq: Every day | ORAL | Status: DC

## 2015-05-03 MED ORDER — ACETAMINOPHEN 650 MG RE SUPP: 650.0000 mg | Freq: Four times a day (QID) | RECTAL | Status: DC | PRN

## 2015-05-03 MED ORDER — METOPROLOL TARTRATE 25 MG PO TABS
25.0000 mg | ORAL_TABLET | Freq: Two times a day (BID) | ORAL | Status: DC
  Administered 2015-05-03 – 2015-05-04 (×2): 25 mg via ORAL
  Filled 2015-05-03 (×2): qty 1

## 2015-05-03 MED ORDER — ONDANSETRON HCL 4 MG/2ML IJ SOLN: 4.0000 mg | Freq: Four times a day (QID) | INTRAMUSCULAR | Status: DC | PRN

## 2015-05-03 MED ORDER — ONDANSETRON HCL 4 MG PO TABS: 4.0000 mg | ORAL_TABLET | Freq: Four times a day (QID) | ORAL | Status: DC | PRN

## 2015-05-03 MED ORDER — SODIUM CHLORIDE 0.9 % IV SOLN
INTRAVENOUS | Status: DC
  Administered 2015-05-03 – 2015-05-04 (×2): via INTRAVENOUS

## 2015-05-03 MED ORDER — ALUM & MAG HYDROXIDE-SIMETH 200-200-20 MG/5ML PO SUSP: 30.0000 mL | Freq: Four times a day (QID) | ORAL | Status: DC | PRN

## 2015-05-03 MED ORDER — PANTOPRAZOLE SODIUM 40 MG IV SOLR
40.0000 mg | Freq: Two times a day (BID) | INTRAVENOUS | Status: DC
  Administered 2015-05-03 – 2015-05-04 (×2): 40 mg via INTRAVENOUS
  Filled 2015-05-03 (×2): qty 40

## 2015-05-03 NOTE — Telephone Encounter (Signed)
Patient's wife answered.  She does not have many details besides he has had blood in his stool since yesterday.  "he goes right much".  She does not know if he is passing blood independent of stool. Her husband is not there he "is at his place of business".  I asked that she have him call back to answer some more questions.    Husband called back.  He had bleeding about once every hour yesterday.  "blood is really dark".  He did sleep through the night without having to get up.  He reports that this am he was going about every 1/2 hour and then the blood was bright red.  He has not had a BM for almost an hour now he passed some clots the last BM.  He was dizzy and light headed this am "but I feel much better now".  He questioned if he should go to work today and decided it was ok and drove himself.  He reports he was "very tired" for a while and rested at his desk and "I feel much better".  He denies lower abdominal pain, cramping, nausea or vomiting.  I directed that he go to the ER now.  He would "like to wait and see if it stops".  I explained that if this is an active lower GI bleed he may have another large episode and it could become an emergency situation.  He wants to go home, he said I could call him there.  I instructed him not to drive himself and that I would call him back once Dr. [name] has a chance to review.

## 2015-05-03 NOTE — H&P (Signed)
Brian [name] at [place] NAME: Brian [name]    MR#:  [id number]  DATE OF BIRTH:  08-Oct-1933  DATE OF ADMISSION:  05/03/2015  PRIMARY CARE PHYSICIAN: Morton Peters, MD   REQUESTING/REFERRING PHYSICIAN: Dr. Archie Balboa  CHIEF COMPLAINT:   Rectal bleeding HISTORY OF PRESENT ILLNESS:  Brian [name]  is a 81 y.o. male with a known history of CAD and hypertension who presents with above complaint. Patient reports since yesterday's had several episodes of bloody stools with lower abdominal pain. Patient denies nausea or vomiting. In the emergency room his hemoglobin is stable. ER physician performed a rectal exam chest was positive. Patient is on blood thinners for his coronary artery disease. Patient's last colonoscopy was probably 6 years ago. He reports that this colonoscopy was normal and without cancerous polyps. PAST MEDICAL HISTORY:   Past Medical History  Diagnosis Date  . Alcoholism   . Arthritis   . Depression with anxiety   . Hyperlipemia   . Hypertension   . Kidney stones   . AAA (abdominal aortic aneurysm)     2.9 CM infrarenal AAA on CT 11/2011  . Gastritis 03/2012  . 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   . Tubular adenoma of colon 07/2006    at sigmoid.   . Diverticulosis of colon 10/2009  . Positive H. pylori titer ?, before 2010    treated with Abx.     PAST SURGICAL HISTORY:   Past Surgical History  Procedure Laterality Date  . None    . Left heart catheterization with coronary angiogram Bilateral 05/29/2014    Procedure: LEFT HEART CATHETERIZATION WITH CORONARY ANGIOGRAM;  Surgeon: Laverda Page, MD;  Location: [facility] CATH LAB;  Service: Cardiovascular;  Laterality: Bilateral;  . Percutaneous coronary stent intervention (pci-s)  05/29/2014    Procedure: PERCUTANEOUS CORONARY STENT INTERVENTION (PCI-S);  Surgeon: Laverda Page, MD;  Location: [facility] CATH LAB;  Service: Cardiovascular;;  DES x2 Prox and Mid  LAD     SOCIAL HISTORY:   History  Substance Use Topics  . Smoking status: Current Every Day Smoker -- 0.50 packs/day for 60 years    Types: Cigarettes  . Smokeless tobacco: Never Used  . Alcohol Use: No    FAMILY HISTORY:   Family History  Problem Relation Age of Onset  . Ulcers Father     stomach   . Colon cancer Neg Hx     DRUG ALLERGIES:  No Known Allergies   REVIEW OF SYSTEMS:  CONSTITUTIONAL: No fever, fatigue or weakness.  EYES: No blurred or double vision.  EARS, NOSE, AND THROAT: No tinnitus or ear pain.  RESPIRATORY: No cough, shortness of breath, wheezing or hemoptysis.  CARDIOVASCULAR: No chest pain, orthopnea, edema.  GASTROINTESTINAL: No nausea, vomiting, diarrhea or abdominal pain. Positive rectal bleeding GENITOURINARY: No dysuria, hematuria.  ENDOCRINE: No polyuria, nocturia,  HEMATOLOGY: No anemia, easy bruising or bleeding SKIN: No rash or lesion. MUSCULOSKELETAL: No joint pain or arthritis.   NEUROLOGIC: No tingling, numbness, weakness.  PSYCHIATRY: No anxiety or depression.   MEDICATIONS AT HOME:   Prior to Admission medications   Medication Sig Start Date End Date Taking? Authorizing Provider  aspirin 81 MG chewable tablet Chew 1 tablet (81 mg total) by mouth daily. 06/02/14  Yes Adrian Prows, MD  atorvastatin (LIPITOR) 80 MG tablet Take 1 tablet (80 mg total) by mouth daily at 6 PM. Patient taking differently: Take 80 mg by mouth  every evening.  06/02/14  Yes Adrian Prows, MD  diazepam (VALIUM) 5 MG tablet Take 5 mg by mouth every 8 (eight) hours as needed for anxiety.    Yes Historical Provider, MD  metoprolol tartrate (LOPRESSOR) 25 MG tablet Take 1 tablet (25 mg total) by mouth 2 (two) times daily. 06/02/14  Yes Adrian Prows, MD  omeprazole (PRILOSEC) 40 MG capsule Take 1 capsule (40 mg total) by mouth daily. 02/04/13  Yes Jerene Bears, MD  ticagrelor (BRILINTA) 90 MG TABS tablet Take 1 tablet (90 mg total) by mouth 2 (two) times daily. 06/02/14  Yes  Adrian Prows, MD  traMADol (ULTRAM) 50 MG tablet Take 50 mg by mouth every 6 (six) hours as needed for moderate pain.   Yes Historical Provider, MD      VITAL SIGNS:  Blood pressure 133/71, pulse 74, temperature 98.2 F (36.8 C), temperature source Oral, resp. rate 18, height 5\' 8"  (1.727 m), weight 68.04 kg (150 lb), SpO2 97 %.  PHYSICAL EXAMINATION:  GENERAL:  81 y.o.-year-old patient lying in the bed with no acute distress.  EYES: Pupils equal, round, reactive to light and accommodation. No scleral icterus. Extraocular muscles intact.  HEENT: Head atraumatic, normocephalic. Oropharynx and nasopharynx clear.  NECK:  Supple, no jugular venous distention. No thyroid enlargement, no tenderness.  LUNGS: Normal breath sounds bilaterally, no wheezing, rales,rhonchi or crepitation. No use of accessory muscles of respiration.  CARDIOVASCULAR: S1, S2 normal. No murmurs, rubs, or gallops.  ABDOMEN: Soft, nontender, nondistended. Bowel sounds present. No organomegaly or mass.  EXTREMITIES: No pedal edema, cyanosis, or clubbing.  NEUROLOGIC: Cranial nerves II through XII are intact. Muscle strength 5/5 in all extremities. Sensation intact. Gait not checked.  PSYCHIATRIC: The patient is alert and oriented x 3.  SKIN: No obvious rash, lesion, or ulcer.   LABORATORY PANEL:   CBC  Recent Labs Lab 05/03/15 1720  WBC 7.7  HGB 13.9  HCT 42.2  PLT 167   ------------------------------------------------------------------------------------------------------------------  Chemistries   Recent Labs Lab 05/03/15 1720  NA 141  K 4.1  CL 106  CO2 27  GLUCOSE 100*  BUN 14  CREATININE 0.70  CALCIUM 9.3  AST 20  ALT 18  ALKPHOS 86  BILITOT 0.3   ------------------------------------------------------------------------------------------------------------------  Cardiac Enzymes No results for input(s): TROPONINI in the last 168  hours. ------------------------------------------------------------------------------------------------------------------  RADIOLOGY:  No results found.  EKG:  EKG is pending  IMPRESSION AND PLAN:  This is a 81 year old male with history of CAD on Reglan time and hypertension who presents with rectal bleeding.  1. Rectal bleeding: Patient will be admitted to the hospital service. I will obtain a GI consultation. If he has further bleeding we should obtain a GI bleeding scan. We will continue to cycle his hemoglobin. Next hemoglobin level is 4 2200. I will start the patient on Protonix. I will hold blood thinners for now.  2. CAD status post 2 stents: I will continue his outpatient medications with the exception of his blood thinners due to problem #1. If needed he does see the [facility] cardiology which we can consult. Patient is currently chest pain-free and stable from a CAD standpoint.  3. Essential hypertension: Patient's blood pressure seems to be controlled on metoprolol which I will continue.  Or. Hyperlipidemia: I will continue Lipitor.    All the records are reviewed and case discussed with ED provider. Management plans discussed with the patient and he is in agreement.  CODE STATUS: DO NOT RESUSCITATE  TOTAL  TIME TAKING CARE OF THIS PATIENT: 45 minutes.    Thalia Turkington M.D on 05/03/2015 at 7:03 PM  Between 7am to 6pm - Pager - [phone] After 6pm go to [email or web address] - password [email or web address] [place] [facility]  Office  [phone]  CC: Primary care physician; Morton Peters, MD

## 2015-05-03 NOTE — ED Provider Notes (Signed)
[facility] Emergency Department Provider Note   ____________________________________________  Time seen: 1700  I have reviewed the triage vital signs and the nursing notes.   HISTORY  Chief Complaint Rectal Bleeding   History limited by: Not Limited   HPI KORBEN CARCIONE is a 81 y.o. male who presents to the emergency department today because of rectal bleeding. The patient states that he first noticed some bright red blood per rectum yesterday. This morning he also had some bright red blood however now has more darker stool. He states that he has had some leakage into his underwear and that he has had blood with every bowel movement. Had been having bloody bowel movements every hour. Has had some associated minimal abdominal discomfort. States he had rectal bleeding roughly 4-5 years ago and was told he had an irritated stomach. Had some weakness this morning but that resolved. Denies any fevers.    Past Medical History  Diagnosis Date  . Alcoholism   . Arthritis   . Depression with anxiety   . Hyperlipemia   . Hypertension   . Kidney stones   . AAA (abdominal aortic aneurysm)     2.9 CM infrarenal AAA on CT 11/2011  . Gastritis 03/2012  . 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   . Tubular adenoma of colon 07/2006    at sigmoid.   . Diverticulosis of colon 10/2009    Patient Active Problem List   Diagnosis Date Noted  . Acute anterior wall MI 05/29/2014  . H. pylori infection 02/21/2012  . Dyspepsia 02/21/2012  . Hx of adenomatous colonic polyps 02/21/2012  . Anxiety and depression 02/21/2012  . HTN (hypertension) 02/21/2012  . Hyperlipidemia 02/21/2012  . Renal stones 02/21/2012    Past Surgical History  Procedure Laterality Date  . None    . Left heart catheterization with coronary angiogram Bilateral 05/29/2014    Procedure: LEFT HEART CATHETERIZATION WITH CORONARY ANGIOGRAM;  Surgeon: Laverda Page, MD;  Location: [facility] CATH  LAB;  Service: Cardiovascular;  Laterality: Bilateral;  . Percutaneous coronary stent intervention (pci-s)  05/29/2014    Procedure: PERCUTANEOUS CORONARY STENT INTERVENTION (PCI-S);  Surgeon: Laverda Page, MD;  Location: [facility] CATH LAB;  Service: Cardiovascular;;  DES x2 Prox and Mid LAD     Current Outpatient Rx  Name  Route  Sig  Dispense  Refill  . aspirin 81 MG chewable tablet   Oral   Chew 1 tablet (81 mg total) by mouth daily.         Marland Kitchen atorvastatin (LIPITOR) 80 MG tablet   Oral   Take 1 tablet (80 mg total) by mouth daily at 6 PM.   30 tablet   1   . diazepam (VALIUM) 5 MG tablet   Oral   Take 5 mg by mouth every 8 (eight) hours as needed.         . metoprolol tartrate (LOPRESSOR) 25 MG tablet   Oral   Take 1 tablet (25 mg total) by mouth 2 (two) times daily.   30 tablet   1   . omeprazole (PRILOSEC) 40 MG capsule   Oral   Take 1 capsule (40 mg total) by mouth daily.   60 capsule   6   . ticagrelor (BRILINTA) 90 MG TABS tablet   Oral   Take 1 tablet (90 mg total) by mouth 2 (two) times daily.   60 tablet   0     Additional refills  after coupon use     Allergies Review of patient's allergies indicates no known allergies.  Family History  Problem Relation Age of Onset  . Ulcers Father     stomach   . Colon cancer Neg Hx     Social History History  Substance Use Topics  . Smoking status: Current Every Day Smoker -- 0.50 packs/day for 60 years    Types: Cigarettes  . Smokeless tobacco: Never Used  . Alcohol Use: No    Review of Systems  Constitutional: Negative for fever. Cardiovascular: Negative for chest pain. Respiratory: Negative for shortness of breath. Gastrointestinal: Negative for abdominal pain, vomiting and diarrhea. Positive for GI bleed.  Genitourinary: Negative for dysuria. Musculoskeletal: Negative for back pain. Skin: Negative for rash. Neurological: Negative for headaches, focal weakness or numbness.  10-point ROS  otherwise negative.  ____________________________________________   PHYSICAL EXAM:  VITAL SIGNS: ED Triage Vitals  Enc Vitals Group     BP 05/03/15 1618 120/81 mmHg     Pulse Rate 05/03/15 1618 86     Resp 05/03/15 1618 20     Temp 05/03/15 1618 98.2 F (36.8 C)     Temp Source 05/03/15 1618 Oral     SpO2 05/03/15 1618 95 %     Weight 05/03/15 1618 150 lb (68.04 kg)     Height 05/03/15 1618 5\' 8"  (1.727 m)     Head Cir --      Peak Flow --      Pain Score 05/03/15 1619 2   Constitutional: Alert and oriented. Well appearing and in no distress. Eyes: Conjunctivae are normal. PERRL. Normal extraocular movements. ENT   Head: Normocephalic and atraumatic.   Nose: No congestion/rhinnorhea.   Mouth/Throat: Mucous membranes are moist.   Neck: No stridor. Hematological/Lymphatic/Immunilogical: No cervical lymphadenopathy. Cardiovascular: Normal rate, regular rhythm.  No murmurs, rubs, or gallops. Respiratory: Normal respiratory effort without tachypnea nor retractions. Breath sounds are clear and equal bilaterally. No wheezes/rales/rhonchi. Gastrointestinal: Soft and nontender. No distention.  Rectal: Small amount of red blood on glove Genitourinary: Deferred Musculoskeletal: Normal range of motion in all extremities. No joint effusions.  No lower extremity tenderness nor edema. Neurologic:  Normal speech and language. No gross focal neurologic deficits are appreciated. Speech is normal.  Skin:  Skin is warm, dry and intact. No rash noted. Psychiatric: Mood and affect are normal. Speech and behavior are normal. Patient exhibits appropriate insight and judgment.  ____________________________________________    LABS (pertinent positives/negatives)  Labs Reviewed  CBC - Abnormal; Notable for the following:    RBC 4.26 (*)    All other components within normal limits  COMPREHENSIVE METABOLIC PANEL - Abnormal; Notable for the following:    Glucose, Bld 100 (*)    All  other components within normal limits  POC OCCULT BLOOD, ED     ____________________________________________   EKG  None  ____________________________________________    RADIOLOGY  None  ____________________________________________   PROCEDURES  Procedure(s) performed: None  Critical Care performed: No  ____________________________________________   INITIAL IMPRESSION / ASSESSMENT AND PLAN / ED COURSE  Pertinent labs & imaging results that were available during my care of the patient were reviewed by me and considered in my medical decision making (see chart for details).  She presents to the emergency department with GI bleeding that started yesterday. On exam patient did have some bright red blood on the glove. Patient does have a history of diverticulosis. Additionally patient on blood thinner. Given the high risk  for severe bleeding from diverticular bleeds in fact the patient is on blood thinner will plan on admission to the hospital for observation and further management.   ____________________________________________   FINAL CLINICAL IMPRESSION(S) / ED DIAGNOSES  Final diagnoses:  Gastrointestinal hemorrhage, unspecified gastritis, unspecified gastrointestinal hemorrhage type     Nance Pear, MD 05/03/15 2060

## 2015-05-03 NOTE — Telephone Encounter (Signed)
Patient and his wife notified to go to the ER and that again patient is not to drive himself. Azucena Freed, PA notified patient will go to [facility].

## 2015-05-03 NOTE — Telephone Encounter (Signed)
Agree with ER evaluation of likely lower GI bleeding

## 2015-05-04 DIAGNOSIS — Z8719 Personal history of other diseases of the digestive system: Secondary | ICD-10-CM

## 2015-05-04 LAB — CBC
HCT: 37.6 % — ABNORMAL LOW (ref 40.0–52.0)
Hemoglobin: 12.4 g/dL — ABNORMAL LOW (ref 13.0–18.0)
MCH: 32.5 pg (ref 26.0–34.0)
MCHC: 33.1 g/dL (ref 32.0–36.0)
MCV: 98.3 fL (ref 80.0–100.0)
Platelets: 138 10*3/uL — ABNORMAL LOW (ref 150–440)
RBC: 3.82 MIL/uL — ABNORMAL LOW (ref 4.40–5.90)
RDW: 13.8 % (ref 11.5–14.5)
WBC: 7.8 10*3/uL (ref 3.8–10.6)

## 2015-05-04 LAB — ABO/RH: ABO/RH(D): A POS

## 2015-05-04 MED ORDER — ENSURE ENLIVE PO LIQD: 237.0000 mL | Freq: Two times a day (BID) | ORAL | Status: DC

## 2015-05-04 NOTE — Progress Notes (Signed)
Pt admitted to [facility] from ED. VSS. Alert and oriented. No distress noted. Pt resting quietly throughout night. No c/o pain. No n/v noted. No stools noted this shift. Will cont to monitor.

## 2015-05-04 NOTE — Progress Notes (Signed)
[facility] [place] NAME: Brian [name]    MR#:  [id number]  DATE OF BIRTH:  [birth year]  SUBJECTIVE:  CHIEF COMPLAINT:   Chief Complaint  Patient presents with  . Rectal Bleeding    has blood in stool, black stools, some abd pain, started yesterday am  - admitted for rectal bleeding yesterday, stopped today. - Hemoglobin is stable this morning. -Feels better and wants to go home. REVIEW OF SYSTEMS:  Review of Systems  Constitutional: Negative for fever and chills.  Respiratory: Negative for cough, shortness of breath and wheezing.   Cardiovascular: Negative for chest pain and palpitations.  Gastrointestinal: Negative for nausea, vomiting, abdominal pain, diarrhea, constipation and blood in stool.  Genitourinary: Negative for dysuria.  Neurological: Negative for dizziness, seizures and headaches.    DRUG ALLERGIES:  No Known Allergies  VITALS:  Blood pressure 114/73, pulse 97, temperature 97.9 F (36.6 C), temperature source Oral, resp. rate 17, height 5\' 8"  (1.727 m), weight 63.413 kg (139 lb 12.8 oz), SpO2 96 %.  PHYSICAL EXAMINATION:  Physical Exam  GENERAL:  81 y.o.-year-old patient lying in the bed with no acute distress.  EYES: Pupils equal, round, reactive to light and accommodation. No scleral icterus. Extraocular muscles intact.  HEENT: Head atraumatic, normocephalic. Oropharynx and nasopharynx clear.  NECK:  Supple, no jugular venous distention. No thyroid enlargement, no tenderness.  LUNGS: Normal breath sounds bilaterally, no wheezing, rales,rhonchi or crepitation. No use of accessory muscles of respiration.  CARDIOVASCULAR: S1, S2 normal. No murmurs, rubs, or gallops.  ABDOMEN: Soft, nontender, nondistended. Bowel sounds present. No organomegaly or mass.  EXTREMITIES: No pedal edema, cyanosis, or clubbing.  NEUROLOGIC: Cranial nerves II through XII are intact. Muscle strength 5/5 in all extremities. Sensation  intact. Gait not checked.  PSYCHIATRIC: The patient is alert and oriented x 3.  SKIN: No obvious rash, lesion, or ulcer.    LABORATORY PANEL:   CBC  Recent Labs Lab 05/04/15 0456  WBC 7.8  HGB 12.4*  HCT 37.6*  PLT 138*   ------------------------------------------------------------------------------------------------------------------  Chemistries   Recent Labs Lab 05/03/15 1720  NA 141  K 4.1  CL 106  CO2 27  GLUCOSE 100*  BUN 14  CREATININE 0.70  CALCIUM 9.3  AST 20  ALT 18  ALKPHOS 86  BILITOT 0.3   ------------------------------------------------------------------------------------------------------------------  Cardiac Enzymes No results for input(s): TROPONINI in the last 168 hours. ------------------------------------------------------------------------------------------------------------------  RADIOLOGY:  No results found.  EKG:   Orders placed or performed during the hospital encounter of 05/29/14  . EKG 12-Lead  . EKG 12-Lead  . ED EKG  . ED EKG  . EKG 12-Lead  . EKG 12-Lead    ASSESSMENT AND PLAN:   81 year old male with past medical history significant for coronary artery disease since status post TENS placement in July 2015, hypertension, diverticulosis comes to the hospital secondary to rectal bleed  #1 rectal bleed-likely diverticular bleed.  resolved at this time. -Hemoglobin with slight drop likely dilutional and then stable since last evening. -No further bleeding. -Last colonoscopy was in 2010 that showed significant diverticulosis. GI has been consulted. -Advance diet as no nausea or vomiting. -Likely colonoscopy as outpatient. -In's no further bleeding, we will need to restart his aspirin and present at the time of discharge based on cardiology notes from 2015 as patient had a complicated PCI.  #2 coronary artery disease-no active chest pain, stable, -Follows with labile or cardiology. Will need to restart Ritalin  time and  aspirin at the time of discharge. Continue his statin and metoprolol.  #3 chronic systolic congestive heart failure-from his last MI, his EF was 15% but plan was to repeat echo as outpatient to see improvement in ejection fraction with treatment. He will follow up with cardiology as suggested.  #4 hypertension-continue home medications.  #5 DVT prophylaxis-continue Ted's and SCDs. Patient also ambulatory.    All the records are reviewed and case discussed with Care Management/Social Workerr. Management plans discussed with the patient, family and they are in agreement.  CODE STATUS: Full code  TOTAL TIME TAKING CARE OF THIS PATIENT: 38 minutes.   POSSIBLE D/C today, DEPENDING ON CLINICAL CONDITION.   Gladstone Lighter M.D on 05/04/2015 at 11:11 AM  Between 7am to 6pm - Pager - [phone]  After 6pm go to [email or web address] - password [email or web address] [facility]  Office  [phone]  CC: Primary care physician; Morton Peters, MD

## 2015-05-04 NOTE — Consult Note (Signed)
GI Inpatient Consult Note  Reason for Consult:    Attending Requesting Consult:  History of Present Illness: Brian [name] is a 81 y.o. male presenting for evaluation of hematochezia. Brian [name] reports he was in his usual state of health until 2 days prior to presentation. He developed sudden onset of bright red rectal bleeding. He had several episodes 2 days prior to admission which then continued until 1 day prior to admission. The continued bleeding prompted presentation to the emergency room. He was admitted for evaluation of an management of hematochezia. He has not had any abdominal pain associated with the rectal bleeding. He has not had any further bleeding since yesterday afternoon. The blood did turn slightly dark yesterday afternoon and then has resolved. His hemoglobin is down slightly from his baseline but is stable in the mid 12's. He is overall feeling quite well.  Brian [name] reports a several similar presentation many years ago. He believes he was told that likely source was diverticulosis. His last colonoscopy was 2010 and he did have diverticulosis noted on that study. He has had a recent upper endoscopy with [facility].    Past Medical History:  Past Medical History  Diagnosis Date  . Alcoholism   . Arthritis   . Depression with anxiety   . Hyperlipemia   . Hypertension   . Kidney stones   . AAA (abdominal aortic aneurysm)     2.9 CM infrarenal AAA on CT 11/2011  . Gastritis 03/2012  . 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   . Tubular adenoma of colon 07/2006    at sigmoid.   . Diverticulosis of colon 10/2009  . Positive H. pylori titer ?, before 2010    treated with Abx.     Problem List: Patient Active Problem List   Diagnosis Date Noted  . History of GI diverticular bleed 05/04/2015  . GIB (gastrointestinal bleeding) 05/03/2015  . Acute anterior wall MI 05/29/2014  . H. pylori infection 02/21/2012  . Dyspepsia 02/21/2012  . Hx of adenomatous colonic  polyps 02/21/2012  . Anxiety and depression 02/21/2012  . HTN (hypertension) 02/21/2012  . Hyperlipidemia 02/21/2012  . Renal stones 02/21/2012    Past Surgical History: Past Surgical History  Procedure Laterality Date  . None    . Left heart catheterization with coronary angiogram Bilateral 05/29/2014    Procedure: LEFT HEART CATHETERIZATION WITH CORONARY ANGIOGRAM;  Surgeon: Laverda Page, MD;  Location: [facility] CATH LAB;  Service: Cardiovascular;  Laterality: Bilateral;  . Percutaneous coronary stent intervention (pci-s)  05/29/2014    Procedure: PERCUTANEOUS CORONARY STENT INTERVENTION (PCI-S);  Surgeon: Laverda Page, MD;  Location: [facility] CATH LAB;  Service: Cardiovascular;;  DES x2 Prox and Mid LAD     Allergies: No Known Allergies  Home Medications: No prescriptions prior to admission   Home medication reconciliation was completed with the patient.   Scheduled Inpatient Medications:   . atorvastatin  80 mg Oral q1800  . metoprolol tartrate  25 mg Oral BID  . pantoprazole (PROTONIX) IV  40 mg Intravenous Q12H    Continuous Inpatient Infusions:   . sodium chloride 75 mL/hr at 05/04/15 1054    PRN Inpatient Medications:  acetaminophen **OR** acetaminophen, alum & mag hydroxide-simeth, diazepam, ondansetron **OR** ondansetron (ZOFRAN) IV  Family History: family history includes Ulcers in his father. There is no history of Colon cancer.  The patient's family history is negative for inflammatory bowel disorders, GI malignancy, or solid organ transplantation.  Social History:   reports that he has been smoking Cigarettes.  He has a 30 pack-year smoking history. He has never used smokeless tobacco. He reports that he does not drink alcohol or use illicit drugs. The patient denies ETOH, tobacco, or drug use.   Review of Systems: Constitutional: Weight is stable.  Eyes: No changes in vision. ENT: No oral lesions, sore throat.  GI: see HPI.  Heme/Lymph: No easy bruising.   CV: No chest pain.  GU: No hematuria.  Integumentary: No rashes.  Neuro: No headaches.  Psych: No depression/anxiety.  Endocrine: No heat/cold intolerance.  Allergic/Immunologic: No urticaria.  Resp: No cough, SOB.  Musculoskeletal: No joint swelling.    Physical Examination: BP 114/73 mmHg  Pulse 97  Temp(Src) 97.9 F (36.6 C) (Oral)  Resp 17  Ht 5\' 8"  (1.727 m)  Wt 139 lb 12.8 oz (63.413 kg)  BMI 21.26 kg/m2  SpO2 96% Gen: NAD, alert and oriented x 4 HEENT: PEERLA, EOMI, Neck: supple, no JVD or thyromegaly Chest: CTA bilaterally, no wheezes, crackles, or other adventitious sounds CV: RRR, no m/g/c/r Abd: soft, NT, ND, +BS in all four quadrants; no HSM, guarding, ridigity, or rebound tenderness Ext: no edema, well perfused with 2+ pulses, Skin: no rash or lesions noted Lymph: no LAD  Data: Lab Results  Component Value Date   WBC 7.8 05/04/2015   HGB 12.4* 05/04/2015   HCT 37.6* 05/04/2015   MCV 98.3 05/04/2015   PLT 138* 05/04/2015    Recent Labs Lab 05/03/15 1720 05/03/15 2234 05/04/15 0456  HGB 13.9 12.6* 12.4*   Lab Results  Component Value Date   NA 141 05/03/2015   K 4.1 05/03/2015   CL 106 05/03/2015   CO2 27 05/03/2015   BUN 14 05/03/2015   CREATININE 0.70 05/03/2015   Lab Results  Component Value Date   ALT 18 05/03/2015   AST 20 05/03/2015   ALKPHOS 86 05/03/2015   BILITOT 0.3 05/03/2015   No results for input(s): APTT, INR, PTT in the last 168 hours.   Assessment/Plan: Brian [name] is a 81 y.o. male admitted for evaluation of hematochezia. The bleeding has resolved at this time and his hemoglobin is stable. He feels well and would like to go home. Likely source for the bleeding was diverticular.  Recommendations: - okay to restart Brillinta - follow-up with [facility] regarding possible colonoscopy to f/u the rectal bleeding.  - return to ED for any further rectal bleeding - Safe for discharge since hemoglobin is stable and no  bleeding since yesterday afternoon.  Thank you for the consult. Please call with questions or concerns.  REIN, Grace Blight, MD

## 2015-05-04 NOTE — Progress Notes (Signed)
Initial Nutrition Assessment  INTERVENTION: Meals and Snacks: Cater to patient preferences Medical Food Supplement Therapy: will recommend sending Ensure BID as pt drinks BID PTA Ensure Enlive (each supplement provides 350kcal and 20 grams of protein)  NUTRITION DIAGNOSIS:  Inadequate oral intake related to acute illness as evidenced by  (CL diet order since admission)  GOAL:  Patient will meet greater than or equal to 90% of their needs  MONITOR:   (Energy Intake, Digestive System, Electrolyte and renal Profile)  REASON FOR ASSESSMENT:  Malnutrition Screening Tool    ASSESSMENT:  Pt admitted with rectal bleeding. Diet order just advanced, GI consult pending. PMHx:  Past Medical History  Diagnosis Date  . Alcoholism   . Arthritis   . Depression with anxiety   . Hyperlipemia   . Hypertension   . Kidney stones   . AAA (abdominal aortic aneurysm)     2.9 CM infrarenal AAA on CT 11/2011  . Gastritis 03/2012  . 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   . Tubular adenoma of colon 07/2006    at sigmoid.   . Diverticulosis of colon 10/2009  . Positive H. pylori titer ?, before 2010    treated with Abx.    Diet Order: 2g Sodium Diet order with Thin Liquids  Current Nutrition: Pt on CL since admission, reports eating all of jello this am with bites of broth.  Food/Nutrition-Related History: Pt reports usually eating a banana at breakfast, hotdog for lunch with a big dinner prepared by his wife. Pt reports drinking 2-3 Ensures a day PTA.  Medications: NS at 79mL/hr, Protonix  Electrolyte/Renal Profile and Glucose Profile:   Recent Labs Lab 05/03/15 1720  NA 141  K 4.1  CL 106  CO2 27  BUN 14  CREATININE 0.70  CALCIUM 9.3  GLUCOSE 100*   Protein Profile:  Recent Labs Lab 05/03/15 1720  ALBUMIN 3.9    Gastrointestinal Profile: Last BM 6/20  Intake/Output Summary (Last 24 hours) at 05/04/15 1255 Last data filed at 05/04/15 0900  Gross per 24 hour   Intake 1841.15 ml  Output    775 ml  Net 1066.15 ml    Nutrition-Focused Physical Exam Findings: Nutrition-Focused physical exam completed. Findings are WDL for fat depletion, muscle depletion, and edema.    Weight Change: Pt reports UBW of 150lbs, weighing that 6 months ago (7% weight loss in 6 months) Anthropometrics: Height:  Ht Readings from Last 1 Encounters:  05/03/15 5\' 8"  (1.727 m)    Weight:  Wt Readings from Last 1 Encounters:  05/03/15 139 lb 12.8 oz (63.413 kg)    Wt Readings from Last 10 Encounters:  05/03/15 139 lb 12.8 oz (63.413 kg)  06/02/14 154 lb 12.2 oz (70.2 kg)  02/04/13 151 lb (68.493 kg)  01/01/13 154 lb (69.854 kg)  12/24/12 154 lb 3.2 oz (69.945 kg)  11/15/12 156 lb 12.8 oz (71.124 kg)  03/26/12 149 lb (67.586 kg)  02/21/12 149 lb (67.586 kg)    BMI:  Body mass index is 21.26 kg/(m^2).   Skin:  Reviewed, no issues  Diet Order:  Diet 2 gram sodium Room service appropriate?: Yes; Fluid consistency:: Thin Diet - low sodium heart healthy  EDUCATION NEEDS:  No education needs identified at this time   [place], RD, LDN Pager [phone]

## 2015-05-04 NOTE — Discharge Instructions (Signed)

## 2015-05-04 NOTE — Discharge Summary (Signed)
[facility] NAME: Aquilla Voiles    MR#:  [id number]  DATE OF BIRTH:  05/25/1933  DATE OF ADMISSION:  05/03/2015 ADMITTING PHYSICIAN: Bettey Costa, MD  DATE OF DISCHARGE: 05/04/2015  PRIMARY CARE PHYSICIAN: Morton Peters, MD    ADMISSION DIAGNOSIS:  Gastrointestinal hemorrhage, unspecified gastritis, unspecified gastrointestinal hemorrhage type [K92.2]  DISCHARGE DIAGNOSIS:  Active Problems:   GIB (gastrointestinal bleeding)   History of GI diverticular bleed   SECONDARY DIAGNOSIS:   Past Medical History  Diagnosis Date  . Alcoholism   . Arthritis   . Depression with anxiety   . Hyperlipemia   . Hypertension   . Kidney stones   . AAA (abdominal aortic aneurysm)     2.9 CM infrarenal AAA on CT 11/2011  . Gastritis 03/2012  . 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   . Tubular adenoma of colon 07/2006    at sigmoid.   . Diverticulosis of colon 10/2009  . Positive H. pylori titer ?, before 2010    treated with Abx.     HOSPITAL COURSE:   81 year old male with past medical history significant for coronary artery disease since status post TENS placement in July 2015, hypertension, diverticulosis comes to the hospital secondary to rectal bleed  #1 rectal bleed-likely diverticular bleed. resolved at this time. -Hemoglobin with slight drop likely dilutional and then stable since last evening. -No further bleeding. -Last colonoscopy was in 2010 that showed significant diverticulosis. GI has been consulted. -Advance diet as no nausea or vomiting. -Likely colonoscopy as outpatient. -If no further bleeding, we will need to restart his aspirin and brilinta at the time of discharge based on cardiology notes from 2015 as patient had a complicated PCI.  #2 coronary artery disease-no active chest pain, stable, -Follows with [facility] cardiology. Will need to restart Brilinta and aspirin at the time of discharge.  Continue his statin and metoprolol.  #3 chronic systolic congestive heart failure-from his last MI, his EF was 15% but plan was to repeat echo as outpatient to see improvement in ejection fraction with treatment. He will follow up with cardiology as suggested.  #4 hypertension-continue home medications.  DISCHARGE CONDITIONS:   Stable  CONSULTS OBTAINED:  Treatment Team:  Josefine Class, MD  DRUG ALLERGIES:  No Known Allergies  DISCHARGE MEDICATIONS:   Current Discharge Medication List    CONTINUE these medications which have NOT CHANGED   Details  aspirin 81 MG chewable tablet Chew 1 tablet (81 mg total) by mouth daily.    atorvastatin (LIPITOR) 80 MG tablet Take 1 tablet (80 mg total) by mouth daily at 6 PM. Qty: 30 tablet, Refills: 1    diazepam (VALIUM) 5 MG tablet Take 5 mg by mouth every 8 (eight) hours as needed for anxiety.     metoprolol tartrate (LOPRESSOR) 25 MG tablet Take 1 tablet (25 mg total) by mouth 2 (two) times daily. Qty: 30 tablet, Refills: 1    omeprazole (PRILOSEC) 40 MG capsule Take 1 capsule (40 mg total) by mouth daily. Qty: 60 capsule, Refills: 6    ticagrelor (BRILINTA) 90 MG TABS tablet Take 1 tablet (90 mg total) by mouth 2 (two) times daily. Qty: 60 tablet, Refills: 0    traMADol (ULTRAM) 50 MG tablet Take 50 mg by mouth every 6 (six) hours as needed for moderate pain.         DISCHARGE INSTRUCTIONS:   1. PCP f/u in  1 week 2. GI f/u in 1-2 weeks 3. Cardiology f/u in 2 weeks   If you experience worsening of your admission symptoms, develop shortness of breath, life threatening emergency, suicidal or homicidal thoughts you must seek medical attention immediately by calling 911 or calling your MD immediately  if symptoms less severe.  You Must read complete instructions/literature along with all the possible adverse reactions/side effects for all the Medicines you take and that have been prescribed to you. Take any new Medicines  after you have completely understood and accept all the possible adverse reactions/side effects.   Please note  You were cared for by a hospitalist during your hospital stay. If you have any questions about your discharge medications or the care you received while you were in the hospital after you are discharged, you can call the unit and asked to speak with the hospitalist on call if the hospitalist that took care of you is not available. Once you are discharged, your primary care physician will handle any further medical issues. Please note that NO REFILLS for any discharge medications will be authorized once you are discharged, as it is imperative that you return to your primary care physician (or establish a relationship with a primary care physician if you do not have one) for your aftercare needs so that they can reassess your need for medications and monitor your lab values.    Today   CHIEF COMPLAINT:   Chief Complaint  Patient presents with  . Rectal Bleeding    has blood in stool, black stools, some abd pain, started yesterday am    VITAL SIGNS:  Blood pressure 114/73, pulse 97, temperature 97.9 F (36.6 C), temperature source Oral, resp. rate 17, height 5\' 8"  (1.727 m), weight 63.413 kg (139 lb 12.8 oz), SpO2 96 %.  I/O:   Intake/Output Summary (Last 24 hours) at 05/04/15 1352 Last data filed at 05/04/15 0900  Gross per 24 hour  Intake 1841.15 ml  Output    775 ml  Net 1066.15 ml    PHYSICAL EXAMINATION:   Physical Exam  GENERAL: 81 y.o.-year-old patient lying in the bed with no acute distress.  EYES: Pupils equal, round, reactive to light and accommodation. No scleral icterus. Extraocular muscles intact.  HEENT: Head atraumatic, normocephalic. Oropharynx and nasopharynx clear.  NECK: Supple, no jugular venous distention. No thyroid enlargement, no tenderness.  LUNGS: Normal breath sounds bilaterally, no wheezing, rales,rhonchi or crepitation. No use of  accessory muscles of respiration.  CARDIOVASCULAR: S1, S2 normal. No murmurs, rubs, or gallops.  ABDOMEN: Soft, nontender, nondistended. Bowel sounds present. No organomegaly or mass.  EXTREMITIES: No pedal edema, cyanosis, or clubbing.  NEUROLOGIC: Cranial nerves II through XII are intact. Muscle strength 5/5 in all extremities. Sensation intact. Gait not checked.  PSYCHIATRIC: The patient is alert and oriented x 3.  SKIN: No obvious rash, lesion, or ulcer.   DATA REVIEW:   CBC  Recent Labs Lab 05/04/15 0456  WBC 7.8  HGB 12.4*  HCT 37.6*  PLT 138*    Chemistries   Recent Labs Lab 05/03/15 1720  NA 141  K 4.1  CL 106  CO2 27  GLUCOSE 100*  BUN 14  CREATININE 0.70  CALCIUM 9.3  AST 20  ALT 18  ALKPHOS 86  BILITOT 0.3    Cardiac Enzymes No results for input(s): TROPONINI in the last 168 hours.  Microbiology Results  No results found for this or any previous visit.  RADIOLOGY:  No results  found.  EKG:   Orders placed or performed during the hospital encounter of 05/29/14  . EKG 12-Lead  . EKG 12-Lead  . ED EKG  . ED EKG  . EKG 12-Lead  . EKG 12-Lead      Management plans discussed with the patient, family and they are in agreement.  CODE STATUS:     Code Status Orders        Start     Ordered   05/03/15 2011  Do not attempt resuscitation (DNR)   Continuous    Question Answer Comment  In the event of cardiac or respiratory ARREST Do not call a "code blue"   In the event of cardiac or respiratory ARREST Do not perform Intubation, CPR, defibrillation or ACLS   In the event of cardiac or respiratory ARREST Use medication by any route, position, wound care, and other measures to relive pain and suffering. May use oxygen, suction and manual treatment of airway obstruction as needed for comfort.      05/03/15 2010    Advance Directive Documentation        Most Recent Value   Type of Advance Directive  Healthcare Power of Attorney    Pre-existing out of facility DNR order (yellow form or pink MOST form)     "MOST" Form in Place?        TOTAL TIME TAKING CARE OF THIS PATIENT: 40 minutes.    Gladstone Lighter M.D on 05/04/2015 at 1:52 PM  Between 7am to 6pm - Pager - [phone]  After 6pm go to [email or web address] - password [email or web address] [facility]  Office  [phone]  CC: Primary care physician; Morton Peters, MD

## 2015-05-14 ENCOUNTER — Encounter: Payer: Self-pay | Admitting: Gastroenterology

## 2015-05-14 ENCOUNTER — Ambulatory Visit (INDEPENDENT_AMBULATORY_CARE_PROVIDER_SITE_OTHER): Payer: Medicare Other | Admitting: Gastroenterology

## 2015-05-14 ENCOUNTER — Telehealth: Payer: Self-pay

## 2015-05-14 VITALS — BP 116/60 | HR 76 | Ht 66.5 in | Wt 144.5 lb

## 2015-05-14 DIAGNOSIS — Z86010 Personal history of colonic polyps: Secondary | ICD-10-CM

## 2015-05-14 DIAGNOSIS — Z7902 Long term (current) use of antithrombotics/antiplatelets: Secondary | ICD-10-CM

## 2015-05-14 DIAGNOSIS — K625 Hemorrhage of anus and rectum: Secondary | ICD-10-CM | POA: Insufficient documentation

## 2015-05-14 MED ORDER — NA SULFATE-K SULFATE-MG SULF 17.5-3.13-1.6 GM/177ML PO SOLN: ORAL | Status: DC

## 2015-05-14 NOTE — Telephone Encounter (Signed)
05/14/2015   RE: Brian [name] DOB: 13-Sep-1933 MRN: [id number]   Dear Dr. Einar Gip,    We have scheduled the above patient for an endoscopic procedure. Our records show that he is on anticoagulation therapy.   Please advise as to how long the patient may come off his therapy of Brilinta prior to the procedure, which is scheduled for 06/01/2015.  Please fax back/ or route the completed form to [employer] at [phone].   Sincerely,    Margreta Journey

## 2015-05-14 NOTE — Progress Notes (Addendum)
     05/14/2015 Brian [name] [id number] 05/06/1933   History of Present Illness:  This is a 81 year old male who is known to Dr. [name] for treatment of his nausea/dyspepsia/gastritis.  He was last seen 01/2013 for those issues.  He presents to our office today to discuss a colonoscopy.    Colonoscopy, 10/18/2009, Dr. Verdie Shire. -- Multiple small mouth diverticula were found in the sigmoid, and descending colon, and the transverse colon and ascending colon. Preparation was fair. Examination was otherwise normal. Recommend repeat December 2015  Colonoscopy, 07/24/2006, Dr. Rhona Leavens -- diverticulosis, one 7 mm sigmoid polyp, one 10 mm sigmoid polyp. Pathology: 7 mm polyp = normal colonic mucosa, negative for dysplasia and malignancy. 10 mm polyp = tubular adenoma.  After reviewing those records, Dr. [name] also recommended repeat colonoscopy in 10/2014.  Patient had an episode of rectal bleeding recently as well.  On 6/19 patient started having rectal bleeding that continue multiple times throughout the day both with BMs and sometimes he would pass blood without stool.  It continued to the next day so he went to the ED on 6/20 and was admitted for observation overnight.  He is on Brilinita and that was held while at the hospital.  The bleeding was presumed to be diverticular and resolved while at the hospital.  GI consult by Dr. Rayann Heman said it was ok to resume Brilinta, which he takes since he had a heart attack with 2 DES placed 05/2014.  The patient has not experienced any further bleeding since that time.    He follows with Dr. Einar Gip for his cardiology issues.  He actually sees Dr. Einar Gip next week.   Current Medications, Allergies, Past Medical History, Past Surgical History, Family History and Social History were reviewed in [employer] record.   Physical Exam: BP 116/60 mmHg  Pulse 76  Ht 5' 6.5" (1.689 m)  Wt 144 lb 8 oz (65.545 kg)  BMI 22.98 kg/m2 General: Well  developed white male in no acute distress Head: Normocephalic and atraumatic Eyes:  Sclerae anicteric, conjunctiva pink  Ears: Normal auditory acuity Lungs: Clear throughout to auscultation Heart: Regular rate and rhythm Abdomen: Soft, non-distended.  Normal bowel sounds.  Non-tender. Rectal:  Will be done at the time of colonoscopy. Musculoskeletal: Symmetrical with no gross deformities  Extremities: No edema  Neurological: Alert oriented x 4, grossly non-focal Psychological:  Alert and cooperative. Normal mood and affect  Assessment and Recommendations: -Personal history of colon polyps:  Was due for colonoscopy 10/2014. -Recent LGIB:  Likely diverticular in origin, resolved spontaneously.   -Chronic anti-platelet agent:  On Brilinta for 2 DES placed 05/2014.  Hold Brilinta for 5 days before procedure - will instruct when and how to resume after procedure. Risks and benefits of procedure including bleeding, perforation, infection, missed lesions, medication reactions and possible hospitalization or surgery if complications occur explained. Additional rare but real risk of cardiovascular event such as heart attack or ischemia/infarct of other organs off Brilinta explained and need to seek urgent help if this occurs. Will communicate by phone or EMR with patient's prescribing provider, Dr. Einar Gip, to confirm holding Brilinta is reasonable in this case.   *Will schedule colonoscopy with Dr. [name].  The risks, benefits, and alternatives were discussed with the patient and he consents to proceed.     Addendum: Reviewed and agree with management. Jerene Bears, MD

## 2015-05-14 NOTE — Patient Instructions (Signed)
You have been scheduled for a colonoscopy. Please follow written instructions given to you at your visit today.  Please pick up your prep supplies at the pharmacy within the next 1-3 days. If you use inhalers (even only as needed), please bring them with you on the day of your procedure. Your physician has requested that you go to www.startemmi.com and enter the access code given to you at your visit today. This web site gives a general overview about your procedure. However, you should still follow specific instructions given to you by our office regarding your preparation for the procedure.  We will contact you with information on holding Brilinta before your procedure. If you do not hear from [place] one week before your procedure please contact our office.   We have sent medications to your pharmacy for you to pick up at your convenience.

## 2015-05-18 DIAGNOSIS — I2510 Atherosclerotic heart disease of native coronary artery without angina pectoris: Secondary | ICD-10-CM | POA: Diagnosis not present

## 2015-05-18 DIAGNOSIS — K5289 Other specified noninfective gastroenteritis and colitis: Secondary | ICD-10-CM | POA: Diagnosis not present

## 2015-05-18 DIAGNOSIS — K621 Rectal polyp: Secondary | ICD-10-CM | POA: Diagnosis not present

## 2015-05-18 DIAGNOSIS — I10 Essential (primary) hypertension: Secondary | ICD-10-CM | POA: Diagnosis not present

## 2015-05-18 DIAGNOSIS — E785 Hyperlipidemia, unspecified: Secondary | ICD-10-CM | POA: Diagnosis not present

## 2015-05-19 NOTE — Telephone Encounter (Signed)
Called patient and informed that he is able to hold Brilinta 5 days before procedure. Patient understands.

## 2015-06-01 ENCOUNTER — Encounter: Payer: Self-pay | Admitting: Internal Medicine

## 2015-06-01 ENCOUNTER — Ambulatory Visit (AMBULATORY_SURGERY_CENTER): Payer: Medicare Other | Admitting: Internal Medicine

## 2015-06-01 VITALS — BP 114/68 | HR 52 | Temp 96.8°F | Resp 19 | Ht 66.0 in | Wt 144.0 lb

## 2015-06-01 DIAGNOSIS — D125 Benign neoplasm of sigmoid colon: Secondary | ICD-10-CM

## 2015-06-01 DIAGNOSIS — I10 Essential (primary) hypertension: Secondary | ICD-10-CM | POA: Diagnosis not present

## 2015-06-01 DIAGNOSIS — Z860100 Personal history of colon polyps, unspecified: Secondary | ICD-10-CM

## 2015-06-01 DIAGNOSIS — D120 Benign neoplasm of cecum: Secondary | ICD-10-CM | POA: Diagnosis not present

## 2015-06-01 DIAGNOSIS — K621 Rectal polyp: Secondary | ICD-10-CM | POA: Diagnosis not present

## 2015-06-01 DIAGNOSIS — K625 Hemorrhage of anus and rectum: Secondary | ICD-10-CM

## 2015-06-01 DIAGNOSIS — D122 Benign neoplasm of ascending colon: Secondary | ICD-10-CM | POA: Diagnosis not present

## 2015-06-01 MED ORDER — SODIUM CHLORIDE 0.9 % IV SOLN: 500.0000 mL | INTRAVENOUS | Status: DC

## 2015-06-01 NOTE — Patient Instructions (Signed)
YOU HAD AN ENDOSCOPIC PROCEDURE TODAY AT [facility]:   Refer to the procedure report that was given to you for any specific questions about what was found during the examination.  If the procedure report does not answer your questions, please call your gastroenterologist to clarify.  If you requested that your care partner not be given the details of your procedure findings, then the procedure report has been included in a sealed envelope for you to review at your convenience later.  YOU SHOULD EXPECT: Some feelings of bloating in the abdomen. Passage of more gas than usual.  Walking can help get rid of the air that was put into your GI tract during the procedure and reduce the bloating. If you had a lower endoscopy (such as a colonoscopy or flexible sigmoidoscopy) you may notice spotting of blood in your stool or on the toilet paper. If you underwent a bowel prep for your procedure, you may not have a normal bowel movement for a few days.  Please Note:  You might notice some irritation and congestion in your nose or some drainage.  This is from the oxygen used during your procedure.  There is no need for concern and it should clear up in a day or so.  SYMPTOMS TO REPORT IMMEDIATELY:   Following lower endoscopy (colonoscopy or flexible sigmoidoscopy):  Excessive amounts of blood in the stool  Significant tenderness or worsening of abdominal pains  Swelling of the abdomen that is new, acute  Fever of 100F or higher    For urgent or emergent issues, a gastroenterologist can be reached at any hour by calling [phone].   DIET: Your first meal following the procedure should be a small meal and then it is ok to progress to your normal diet. Heavy or fried foods are harder to digest and may make you feel nauseous or bloated.  Likewise, meals heavy in dairy and vegetables can increase bloating.  Drink plenty of fluids but you should avoid alcoholic beverages for 24  hours.  ACTIVITY:  You should plan to take it easy for the rest of today and you should NOT DRIVE or use heavy machinery until tomorrow (because of the sedation medicines used during the test).    FOLLOW UP: Our staff will call the number listed on your records the next business day following your procedure to check on you and address any questions or concerns that you may have regarding the information given to you following your procedure. If we do not reach you, we will leave a message.  However, if you are feeling well and you are not experiencing any problems, there is no need to return our call.  We will assume that you have returned to your regular daily activities without incident.  If any biopsies were taken you will be contacted by phone or by letter within the next 1-3 weeks.  Please call us at [phone] if you have not heard about the biopsies in 3 weeks.    SIGNATURES/CONFIDENTIALITY: You and/or your care partner have signed paperwork which will be entered into your electronic medical record.  These signatures attest to the fact that that the information above on your After Visit Summary has been reviewed and is understood.  Full responsibility of the confidentiality of this discharge information lies with you and/or your care-partner.   AVOID ALL NSAIDS FOR 3 WEEKS. HOLD BRILINTA FOR 3 ADDITIONAL. CALL OFFICE TO SCHEDULE FOLLOW UP APPOINTMENT FOR NEXT AVAILABLE.  INFORMATION GIVEN ON POLYPS,DIVERTICULOSIS AND HIGH FIBER DIET.

## 2015-06-01 NOTE — Progress Notes (Signed)
Called to room to assist during endoscopic procedure.  Patient ID and intended procedure confirmed with present staff. Received instructions for my participation in the procedure from the performing physician.  

## 2015-06-01 NOTE — Op Note (Signed)
[place]  [address] [place], [address]   COLONOSCOPY PROCEDURE REPORT  PATIENT: Brian [name], Brian [name]  MR#: [id number] BIRTHDATE: 1933-06-10 , 82  yrs. old GENDER: male ENDOSCOPIST: Jerene Bears, MD PROCEDURE DATE:  06/01/2015 PROCEDURE:   Colonoscopy, surveillance , Submucosal injection, any substance, and Colonoscopy with snare polypectomy First Screening Colonoscopy - Avg.  risk and is 50 yrs.  old or older - No.  Prior Negative Screening - Now for repeat screening. N/A  History of Adenoma - Now for follow-up colonoscopy & has been > or = to 3 yrs.  Yes hx of adenoma.  Has been 3 or more years since last colonoscopy.  Polyps removed today? Yes ASA CLASS:   Class III INDICATIONS:Surveillance due to prior colonic neoplasia, PH Colon Adenoma, and rectal bleeding. MEDICATIONS: Monitored anesthesia care and Propofol 250 mg IV  DESCRIPTION OF PROCEDURE:   After the risks benefits and alternatives of the procedure were thoroughly explained, informed consent was obtained.  The digital rectal exam revealed no rectal mass.   The [id number]  endoscope was introduced through the anus and advanced to the cecum, which was identified by both the appendix and ileocecal valve. No adverse events experienced. The quality of the prep was fair.  (Suprep was used)  The instrument was then slowly withdrawn as the colon was fully examined. Estimated blood loss is zero unless otherwise noted in this procedure report.      COLON FINDINGS: A sessile polyp measuring 25 mm in size was found at the cecum.  Endoscopic mucosal resection was performed in a piecemeal fashion by injecting saline and methylene blue into the submucosa to raise the lesion.  Resection was then performed with snare cautery.  The polyp lifted well after submucosal injection There was minimal blood loss from the polypectomy which ceased spontaneously.  Due to the need for restart of  chronic anticoagulation, the polypectomy base was closed prophylactically with hemoclips.  Four (4) placements were made.   Three sessile polyps ranging from 4 to 65mm in size were found at the cecum. Polypectomies were performed with a cold snare.  The resection was complete, the polyp tissue was completely retrieved and sent to histology.   Three sessile polyps ranging from 4 to 9mm in size were found in the ascending colon.  Polypectomies were performed with a cold snare.  The resection was complete, the polyp tissue was completely retrieved and sent to histology.   A sessile polyp measuring 5 mm in size was found in the sigmoid colon.  A polypectomy was performed with a cold snare.  The resection was complete, the polyp tissue was completely retrieved and sent to histology.   There was severe diverticulosis noted in the ascending colon, transverse colon, and left colon.  Retroflexed views revealed internal hemorrhoids and Retroflexed views revealed external hemorrhoids. The time to cecum = 2.1 Withdrawal time = 30.2   The scope was withdrawn and the procedure completed.  COMPLICATIONS: There were no immediate complications.  ENDOSCOPIC IMPRESSION: 1.   Sessile polyp was found at the cecum; endoscopic mucosal resection was performed; prophylactic polypectomy base closure using hemoclips 2.   Three sessile polyps ranging from 4 to 65mm in size were found at the cecum; polypectomies were performed with a cold snare 3.   Three sessile polyps ranging from 4 to 106mm in size were found in the ascending colon; polypectomies were performed with a cold snare 4.   Sessile polyp was found  in the sigmoid colon; polypectomy was performed with a cold snare 5.   Severe diverticulosis was noted in the ascending colon, transverse colon, and left colon  RECOMMENDATIONS: 1.  Avoid all NSAIDs for the next 3 weeks. 2.  Await pathology results 3.  High fiber diet 4.  Timing of repeat colonoscopy  will be determined by pathology findings. 5.  You will receive a letter within 1-2 weeks with the results of your biopsy as well as final recommendations.  Please call my office if you have not received a letter after 3 weeks. 6.  Office follow-up with me next available 7.  Would hold Brilinta for 3 additional days due to polypectomies performed today  eSigned:  Jerene Bears, MD 06/01/2015 8:48 AM   cc: Laurian Brim, MD and The Patient   PATIENT NAME:  Brian [name], Brian [name] MR#: [id number]

## 2015-06-01 NOTE — Progress Notes (Signed)
Transferred to recovery room. A/O x3, pleased with MAC.  VSS.  Report to Shelia, RN. 

## 2015-06-02 ENCOUNTER — Telehealth: Payer: Self-pay | Admitting: *Deleted

## 2015-06-02 NOTE — Telephone Encounter (Signed)
  Follow up Call-  Call back number 06/01/2015 01/01/2013  Post procedure Call Back phone  # [phone]  Permission to leave phone message Yes Yes     Patient questions:  Do you have a fever, pain , or abdominal swelling? No. Pain Score  0 *  Have you tolerated food without any problems? Yes.    Have you been able to return to your normal activities? Yes.    Do you have any questions about your discharge instructions: Diet   No. Medications  No. Follow up visit  No.  Do you have questions or concerns about your Care? No.  Actions: * If pain score is 4 or above: No action needed, pain <4.

## 2015-06-05 ENCOUNTER — Inpatient Hospital Stay
Admission: EM | Admit: 2015-06-05 | Discharge: 2015-06-08 | DRG: 919 | Disposition: A | Discharge status: Home / Self Care | Payer: Medicare Other | Attending: Internal Medicine | Admitting: Internal Medicine

## 2015-06-05 ENCOUNTER — Inpatient Hospital Stay: Payer: Medicare Other

## 2015-06-05 ENCOUNTER — Encounter: Payer: Self-pay | Admitting: Emergency Medicine

## 2015-06-05 ENCOUNTER — Emergency Department (HOSPITAL_COMMUNITY)
Admission: EM | Admit: 2015-06-05 | Discharge: 2015-06-05 | Discharge status: Against Medical Advice | Payer: Medicare Other | Attending: Emergency Medicine | Admitting: Emergency Medicine

## 2015-06-05 DIAGNOSIS — I10 Essential (primary) hypertension: Secondary | ICD-10-CM | POA: Diagnosis present

## 2015-06-05 DIAGNOSIS — Z66 Do not resuscitate: Secondary | ICD-10-CM | POA: Diagnosis present

## 2015-06-05 DIAGNOSIS — F418 Other specified anxiety disorders: Secondary | ICD-10-CM | POA: Diagnosis present

## 2015-06-05 DIAGNOSIS — K633 Ulcer of intestine: Secondary | ICD-10-CM | POA: Diagnosis not present

## 2015-06-05 DIAGNOSIS — I252 Old myocardial infarction: Secondary | ICD-10-CM

## 2015-06-05 DIAGNOSIS — R571 Hypovolemic shock: Secondary | ICD-10-CM | POA: Diagnosis not present

## 2015-06-05 DIAGNOSIS — K91840 Postprocedural hemorrhage and hematoma of a digestive system organ or structure following a digestive system procedure: Principal | ICD-10-CM | POA: Diagnosis present

## 2015-06-05 DIAGNOSIS — K922 Gastrointestinal hemorrhage, unspecified: Secondary | ICD-10-CM

## 2015-06-05 DIAGNOSIS — Z7982 Long term (current) use of aspirin: Secondary | ICD-10-CM

## 2015-06-05 DIAGNOSIS — Z87442 Personal history of urinary calculi: Secondary | ICD-10-CM | POA: Diagnosis not present

## 2015-06-05 DIAGNOSIS — Y92239 Unspecified place in hospital as the place of occurrence of the external cause: Secondary | ICD-10-CM

## 2015-06-05 DIAGNOSIS — I714 Abdominal aortic aneurysm, without rupture: Secondary | ICD-10-CM | POA: Diagnosis present

## 2015-06-05 DIAGNOSIS — I509 Heart failure, unspecified: Secondary | ICD-10-CM | POA: Diagnosis present

## 2015-06-05 DIAGNOSIS — D62 Acute posthemorrhagic anemia: Secondary | ICD-10-CM | POA: Diagnosis present

## 2015-06-05 DIAGNOSIS — M1990 Unspecified osteoarthritis, unspecified site: Secondary | ICD-10-CM | POA: Diagnosis present

## 2015-06-05 DIAGNOSIS — Z79899 Other long term (current) drug therapy: Secondary | ICD-10-CM

## 2015-06-05 DIAGNOSIS — I2510 Atherosclerotic heart disease of native coronary artery without angina pectoris: Secondary | ICD-10-CM | POA: Diagnosis present

## 2015-06-05 DIAGNOSIS — K5730 Diverticulosis of large intestine without perforation or abscess without bleeding: Secondary | ICD-10-CM | POA: Diagnosis not present

## 2015-06-05 DIAGNOSIS — D124 Benign neoplasm of descending colon: Secondary | ICD-10-CM | POA: Diagnosis present

## 2015-06-05 DIAGNOSIS — K921 Melena: Secondary | ICD-10-CM

## 2015-06-05 DIAGNOSIS — D500 Iron deficiency anemia secondary to blood loss (chronic): Secondary | ICD-10-CM | POA: Diagnosis present

## 2015-06-05 DIAGNOSIS — Z955 Presence of coronary angioplasty implant and graft: Secondary | ICD-10-CM | POA: Diagnosis not present

## 2015-06-05 DIAGNOSIS — K625 Hemorrhage of anus and rectum: Secondary | ICD-10-CM | POA: Diagnosis not present

## 2015-06-05 DIAGNOSIS — Z85038 Personal history of other malignant neoplasm of large intestine: Secondary | ICD-10-CM

## 2015-06-05 DIAGNOSIS — T45515A Adverse effect of anticoagulants, initial encounter: Secondary | ICD-10-CM | POA: Diagnosis present

## 2015-06-05 DIAGNOSIS — Z87891 Personal history of nicotine dependence: Secondary | ICD-10-CM | POA: Diagnosis not present

## 2015-06-05 DIAGNOSIS — Z95820 Peripheral vascular angioplasty status with implants and grafts: Secondary | ICD-10-CM | POA: Diagnosis not present

## 2015-06-05 DIAGNOSIS — K2970 Gastritis, unspecified, without bleeding: Secondary | ICD-10-CM | POA: Diagnosis present

## 2015-06-05 DIAGNOSIS — Z86010 Personal history of colonic polyps: Secondary | ICD-10-CM | POA: Diagnosis not present

## 2015-06-05 DIAGNOSIS — I255 Ischemic cardiomyopathy: Secondary | ICD-10-CM | POA: Diagnosis not present

## 2015-06-05 DIAGNOSIS — T45525A Adverse effect of antithrombotic drugs, initial encounter: Secondary | ICD-10-CM | POA: Diagnosis not present

## 2015-06-05 DIAGNOSIS — E785 Hyperlipidemia, unspecified: Secondary | ICD-10-CM | POA: Diagnosis not present

## 2015-06-05 DIAGNOSIS — Z7902 Long term (current) use of antithrombotics/antiplatelets: Secondary | ICD-10-CM | POA: Diagnosis not present

## 2015-06-05 DIAGNOSIS — E440 Moderate protein-calorie malnutrition: Secondary | ICD-10-CM | POA: Insufficient documentation

## 2015-06-05 DIAGNOSIS — Y848 Other medical procedures as the cause of abnormal reaction of the patient, or of later complication, without mention of misadventure at the time of the procedure: Secondary | ICD-10-CM

## 2015-06-05 DIAGNOSIS — K635 Polyp of colon: Secondary | ICD-10-CM | POA: Diagnosis not present

## 2015-06-05 HISTORY — DX: Atherosclerotic heart disease of native coronary artery without angina pectoris: I25.10

## 2015-06-05 LAB — CBC WITH DIFFERENTIAL/PLATELET
Basophils Absolute: 0 10*3/uL (ref 0–0.1)
EOS ABS: 0 10*3/uL (ref 0–0.7)
Eosinophils Relative: 0 %
HCT: 37.4 % — ABNORMAL LOW (ref 40.0–52.0)
Hemoglobin: 12.4 g/dL — ABNORMAL LOW (ref 13.0–18.0)
Lymphocytes Relative: 8 %
Lymphs Abs: 0.8 10*3/uL — ABNORMAL LOW (ref 1.0–3.6)
MCH: 31.6 pg (ref 26.0–34.0)
MCHC: 33.2 g/dL (ref 32.0–36.0)
MCV: 94.9 fL (ref 80.0–100.0)
Monocytes Absolute: 0.3 10*3/uL (ref 0.2–1.0)
Monocytes Relative: 3 %
Neutro Abs: 9.9 10*3/uL — ABNORMAL HIGH (ref 1.4–6.5)
PLATELETS: 89 10*3/uL — AB (ref 150–400)
RBC: 3.94 MIL/uL — ABNORMAL LOW (ref 4.40–5.90)
RDW: 14.8 % — ABNORMAL HIGH (ref 11.5–14.5)
WBC: 11.1 10*3/uL — ABNORMAL HIGH (ref 3.8–10.6)

## 2015-06-05 LAB — CBC
HCT: 36.6 % — ABNORMAL LOW (ref 40.0–52.0)
HCT: 37.3 % — ABNORMAL LOW (ref 40.0–52.0)
HEMOGLOBIN: 12.2 g/dL — AB (ref 13.0–18.0)
Hemoglobin: 12.7 g/dL — ABNORMAL LOW (ref 13.0–18.0)
MCH: 32.7 pg (ref 26.0–34.0)
MCH: 31.8 pg (ref 26.0–34.0)
MCHC: 33.3 g/dL (ref 32.0–36.0)
MCHC: 34 g/dL (ref 32.0–36.0)
MCV: 98.3 fL (ref 80.0–100.0)
MCV: 93.5 fL (ref 80.0–100.0)
PLATELETS: 100 10*3/uL — AB (ref 150–440)
Platelets: 172 10*3/uL (ref 150–440)
RBC: 3.73 MIL/uL — ABNORMAL LOW (ref 4.40–5.90)
RBC: 3.99 MIL/uL — ABNORMAL LOW (ref 4.40–5.90)
RDW: 13.6 % (ref 11.5–14.5)
RDW: 14.5 % (ref 11.5–14.5)
WBC: 9.5 10*3/uL (ref 3.8–10.6)
WBC: 10.8 10*3/uL — ABNORMAL HIGH (ref 3.8–10.6)

## 2015-06-05 LAB — COMPREHENSIVE METABOLIC PANEL
ALK PHOS: 66 U/L (ref 38–126)
ALT: 13 U/L — ABNORMAL LOW (ref 17–63)
ANION GAP: 8 (ref 5–15)
AST: 21 U/L (ref 15–41)
Albumin: 3.4 g/dL — ABNORMAL LOW (ref 3.5–5.0)
BUN: 12 mg/dL (ref 6–20)
CHLORIDE: 110 mmol/L (ref 101–111)
CO2: 22 mmol/L (ref 22–32)
Calcium: 8.6 mg/dL — ABNORMAL LOW (ref 8.9–10.3)
Creatinine, Ser: 0.84 mg/dL (ref 0.61–1.24)
GFR calc Af Amer: >60 mL/min (ref >60)
GFR calc non Af Amer: >60 mL/min (ref >60)
Glucose, Bld: 203 mg/dL — ABNORMAL HIGH (ref 65–99)
Potassium: 3.7 mmol/L (ref 3.5–5.1)
Sodium: 140 mmol/L (ref 135–145)
TOTAL PROTEIN: 6.3 g/dL — AB (ref 6.5–8.1)
Total Bilirubin: 0.5 mg/dL (ref 0.3–1.2)

## 2015-06-05 LAB — PROTIME-INR
INR: 1.03
Prothrombin Time: 13.7 seconds (ref 11.4–15.0)

## 2015-06-05 LAB — MRSA PCR SCREENING: MRSA by PCR: NEGATIVE

## 2015-06-05 MED ORDER — SODIUM CHLORIDE 0.9 % IV SOLN: 10.0000 mL/h | Freq: Once | INTRAVENOUS | Status: DC

## 2015-06-05 MED ORDER — BISACODYL 5 MG PO TBEC
10.0000 mg | DELAYED_RELEASE_TABLET | Freq: Once | ORAL | Status: AC
  Administered 2015-06-05: 10 mg via ORAL
  Filled 2015-06-05: qty 2

## 2015-06-05 MED ORDER — SODIUM CHLORIDE 0.9 % IV SOLN
10.0000 mL/h | Freq: Once | INTRAVENOUS | Status: AC
  Administered 2015-06-05: 10 mL/h via INTRAVENOUS

## 2015-06-05 MED ORDER — TECHNETIUM TC 99M-LABELED RED BLOOD CELLS IV KIT
21.5000 | PACK | Freq: Once | INTRAVENOUS | Status: AC | PRN
Start: 2015-06-05 — End: 2015-06-05
  Administered 2015-06-05: 21.5 via INTRAVENOUS

## 2015-06-05 MED ORDER — PANTOPRAZOLE SODIUM 40 MG IV SOLR
40.0000 mg | Freq: Two times a day (BID) | INTRAVENOUS | Status: DC
  Administered 2015-06-05 – 2015-06-08 (×7): 40 mg via INTRAVENOUS
  Filled 2015-06-05 (×7): qty 40

## 2015-06-05 MED ORDER — ATORVASTATIN CALCIUM 20 MG PO TABS
80.0000 mg | ORAL_TABLET | Freq: Every evening | ORAL | Status: DC
  Administered 2015-06-05 – 2015-06-07 (×3): 80 mg via ORAL
  Filled 2015-06-05 (×3): qty 4

## 2015-06-05 MED ORDER — SODIUM CHLORIDE 0.9 % IV SOLN
Freq: Once | INTRAVENOUS | Status: AC
  Administered 2015-06-05: 16:00:00 via INTRAVENOUS

## 2015-06-05 MED ORDER — SODIUM CHLORIDE 0.9 % IV SOLN
INTRAVENOUS | Status: DC
  Administered 2015-06-05: 10 mL/h via INTRAVENOUS

## 2015-06-05 NOTE — ED Notes (Signed)
Pt has few minutes left in scan; monitor battery is dead. Pt is alert & in NAD. Denies pain, nausea, and no visible signs of distress. Continue to monitor and then take to ICU.

## 2015-06-05 NOTE — Progress Notes (Signed)
Patient received total of 4 units of PRBC, and running now first FFP. Platelets are arriving from [facility] as per the blood bank. His blood pressure is now stable around 130/70.  He is more alert on exam, and oriented.  On warming blankets.  Assessment and plan  * Hypovolemic shock due to acute no GI bleeding    We are currently resuscitating with fluids, his receiving PRBCs, we will check his CBC stat.  As he took 3 doses of Brilanta since yesterday, we need to get platelets, which will be started soon.   I again called Dr.Oh and discussed with him about the case. As per him and patient is acutely bleeding, although he might see his blood in colon and not able to pinpoint the source. And he'll need to be stabilized first.  I also spoke to Dr. Ronalee Belts the vascular surgeon , he suggested to do a bleeding scan- which we are arranging for now. If it shows a localized source of bleeding , he might help : Otherwise patient might need general surgeon.  Meanwhile patient's daughter arrived, she is a Marine scientist and works part time in Dr. [employer] office. I explained to her about the critical situation, and our management. She agreed with the plan. Agents wife is also present.  Patient is DO NOT RESUSCITATE. Additional critical care time spent 45 minutes.

## 2015-06-05 NOTE — ED Provider Notes (Addendum)
[facility] Emergency Department Provider Note  ____________________________________________  Time seen: Approximately 11:46 AM  I have reviewed the triage vital signs and the nursing notes.   HISTORY  Chief Complaint Rectal Bleeding    HPI Brian [name] is a 81 y.o. male patient woke up this morning with a lot of blood per rectum and clots. On arrival here patient was pale and very weak. Patient wife reported he had an endoscopy done 4 days ago. This showed gastritis. Wife reports they put 2 clamps in. I cannot find a report of this on the paperwork she brought with her. Patient was on Dilantin for his heart attack from last year and his 2 stents. Patient had stop prolonged time and restarted it after 3 days as he had been instructed. The GI bleeding started today after he started restarted Brilinta last night. Patient has a past history of alcoholism he is not currently drinking.   Past Medical History  Diagnosis Date  . Alcoholism   . Arthritis   . Depression with anxiety   . Hyperlipemia   . Hypertension   . Kidney stones   . AAA (abdominal aortic aneurysm)     2.9 CM infrarenal AAA on CT 11/2011  . Gastritis 03/2012  . 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   . Tubular adenoma of colon 07/2006    at sigmoid.   . Diverticulosis of colon 10/2009  . Positive H. pylori titer ?, before 2010    treated with Abx.     Patient Active Problem List   Diagnosis Date Noted  . History of colonic polyps 05/14/2015  . Rectal bleeding 05/14/2015  . Encounter for long-term (current) use of antiplatelets/antithrombotics 05/14/2015  . History of GI diverticular bleed 05/04/2015  . GIB (gastrointestinal bleeding) 05/03/2015  . Acute anterior wall MI 05/29/2014  . H. pylori infection 02/21/2012  . Dyspepsia 02/21/2012  . Hx of adenomatous colonic polyps 02/21/2012  . Anxiety and depression 02/21/2012  . HTN (hypertension) 02/21/2012  . Hyperlipidemia  02/21/2012  . Renal stones 02/21/2012    Past Surgical History  Procedure Laterality Date  . None    . Left heart catheterization with coronary angiogram Bilateral 05/29/2014    Procedure: LEFT HEART CATHETERIZATION WITH CORONARY ANGIOGRAM;  Surgeon: Laverda Page, MD;  Location: [facility] CATH LAB;  Service: Cardiovascular;  Laterality: Bilateral;  . Percutaneous coronary stent intervention (pci-s)  05/29/2014    Procedure: PERCUTANEOUS CORONARY STENT INTERVENTION (PCI-S);  Surgeon: Laverda Page, MD;  Location: [facility] CATH LAB;  Service: Cardiovascular;;  DES x2 Prox and Mid LAD     Current Outpatient Rx  Name  Route  Sig  Dispense  Refill  . lisinopril (PRINIVIL,ZESTRIL) 2.5 MG tablet   Oral   Take 2.5 mg by mouth daily.         Marland Kitchen aspirin 81 MG chewable tablet   Oral   Chew 1 tablet (81 mg total) by mouth daily.         Marland Kitchen atorvastatin (LIPITOR) 80 MG tablet   Oral   Take 1 tablet (80 mg total) by mouth daily at 6 PM. Patient taking differently: Take 80 mg by mouth every evening.    30 tablet   1   . diazepam (VALIUM) 5 MG tablet   Oral   Take 5 mg by mouth every 8 (eight) hours as needed for anxiety.          . metoprolol tartrate (LOPRESSOR) 25  MG tablet   Oral   Take 1 tablet (25 mg total) by mouth 2 (two) times daily.   30 tablet   1   . ticagrelor (BRILINTA) 90 MG TABS tablet   Oral   Take 1 tablet (90 mg total) by mouth 2 (two) times daily.   60 tablet   0     Additional refills after coupon use   . traMADol (ULTRAM) 50 MG tablet   Oral   Take 50 mg by mouth every 6 (six) hours as needed for moderate pain.           Allergies Review of patient's allergies indicates no known allergies.  Family History  Problem Relation Age of Onset  . Ulcers Father     stomach   . Colon cancer Neg Hx     Social History History  Substance Use Topics  . Smoking status: Former Smoker -- 0.50 packs/day for 60 years    Types: Cigarettes  . Smokeless tobacco:  Never Used  . Alcohol Use: No    Review of Systems Constitutional: No fever/chills Eyes: No visual changes. ENT: No sore throat. Cardiovascular: Denies chest pain. Respiratory: Denies shortness of breath. Gastrointestinal: No abdominal pain.  No nausea, no vomiting.  No diarrhea.  No constipation. Genitourinary: Negative for dysuria. Musculoskeletal: Negative for back pain. Skin: Negative for rash. Neurological: Negative for headaches, focal weakness or numbness.  10-point ROS otherwise negative.  ____________________________________________   PHYSICAL EXAM:  VITAL SIGNS: ED Triage Vitals  Enc Vitals Group     BP 06/05/15 1138 133/87 mmHg     Pulse Rate 06/05/15 1138 68     Resp 06/05/15 1138 17     Temp 06/05/15 1138 97.7 F (36.5 C)     Temp Source 06/05/15 1138 Oral     SpO2 06/05/15 1138 100 %     Weight 06/05/15 1138 150 lb (68.04 kg)     Height 06/05/15 1138 5\' 6"  (1.676 m)     Head Cir --      Peak Flow --      Pain Score --      Pain Loc --      Pain Edu? --      Excl. in [place]? --     Constitutional: Alert and oriented. Well appearing and in no acute distress. Eyes: Conjunctivae are normal. PERRL. EOMI. Head: Atraumatic. Nose: No congestion/rhinnorhea. Mouth/Throat: Mucous membranes are moist.  Oropharynx non-erythematous. Neck: No stridor.   Cardiovascular: Normal rate, regular rhythm. Grossly normal heart sounds.  Good peripheral circulation. Respiratory: Normal respiratory effort.  No retractions. Lungs CTAB. Gastrointestinal: Soft and nontender. No distention. No abdominal bruits. No CVA tenderness. Rectal exam nontender however there is a lot of maroon blood in there Musculoskeletal: No lower extremity tenderness nor edema.  No joint effusions. Neurologic:  Normal speech and language. No gross focal neurologic deficits are appreciated. No gait instability. Skin:  Skin is warm, dry and intact. No rash noted. Psychiatric: Mood and affect are normal.  Speech and behavior are normal.  ____________________________________________   LABS (all labs ordered are listed, but only abnormal results are displayed)  Labs Reviewed  COMPREHENSIVE METABOLIC PANEL  CBC  PROTIME-INR  POC OCCULT BLOOD, ED  TYPE AND SCREEN   ____________________________________________  EKG  EKG read and interpreted by me shows normal sinus rhythm rhythm at a rate of 73. Left axis. There is poor R-wave progression. An Q's in III and F. There are no acute findings. ____________________________________________  RADIOLOGY   ____________________________________________   PROCEDURES    ____________________________________________   INITIAL IMPRESSION / ASSESSMENT AND PLAN / ED COURSE  Pertinent labs & imaging results that were available during my care of the patient were reviewed by me and considered in my medical decision making (see chart for details).  Discussed patient with Dr. Candace Cruise. He will have the hospitalist admit the patient in consult. We're currently awaiting the results of the blood work. Patient's blood pressures come up with fluid. ____________________________________________   FINAL CLINICAL IMPRESSION(S) / ED DIAGNOSES  Final diagnoses:  Gastrointestinal hemorrhage with melena      Nena Polio, MD 06/05/15 1151  After patient signed at the hospitals becomes acutely hypotensive and has large amount of the GI rectal bleeding with blood clots. I obtain packed red cells and initiate transfusion we asked for emergency release blood but that turned out that the packed red cells and ordered earlier were ready. We then got FFP ordered and platelets ordered. Patient ended up getting 3 units of packed red cells transfused stat under my direction as well as 1 unit of FFP. I then be signed out the patient to the hospitalist after initiating fourth unit of packed red cells. Hospitals had discussed with the gastrologist Dr. Candace Cruise and then Dr.  Hinton Lovely the vascular surgeon the patient's course in the plan is now to get him to a bleeding scan and then if one or 2 bleeding spots Dr. Hinton Lovely may be of emboli's disease otherwise general surgery will have to do surgery and do a colectomy otherwise limited to bleeding. We will use a mask blood transfusion protocol and transfusing with 1 unit of packed red cells 1 unit of FFP and 1 pack of platelets we will need to get the platelets from [facility] 1 unit has been ordered and will be available shortly another unit has been ordered and will be available soon the hospitalist is ordering 2 more units of platelets. FFP is available here and we will give the patient more FFP  Critical care time for this patient 45 minutes including a supervising a transfusion with blood pressure bags starting an external jugular vein IV and watching hypo-to his hypotension   Nena Polio, MD 06/05/15 1551

## 2015-06-05 NOTE — Progress Notes (Signed)
Paged Dr. Candace Cruise and spoke with him concerning orders placed.  An order was placed to do a tap water enema by Dr.Oh.  I spoke with him and per Dr. Candace Cruise, do not do enema until 7:00am.  Per Dr. Candace Cruise, discontinue hemoglobin lab checks and continue to have lab draw CBC's q6hr.  Dr. Candace Cruise is aware of current hemoglobin level.  Will continue to monitor.

## 2015-06-05 NOTE — ED Notes (Signed)
Pt to continue scan at nuclear medicine for 2nd hour. VS stable; RN at bedside.

## 2015-06-05 NOTE — Progress Notes (Signed)
   06/05/15 1400  Clinical Encounter Type  Visited With Patient and family together  Visit Type ED  Referral From Other (Comment)  Consult/Referral To Chaplain  Spiritual Encounters  Spiritual Needs Prayer;Emotional  Stress Factors  Patient Stress Factors Health changes  Family Stress Factors Health changes  Chaplain provided pastoral care to patient and family.   [place] [phone]

## 2015-06-05 NOTE — ED Notes (Addendum)
Pt to nuclear medicine for scan with RN at bedside

## 2015-06-05 NOTE — ED Notes (Signed)
Pt voided an additional 400 mls of bright red blood with many clots, some golf-ball sized.

## 2015-06-05 NOTE — Progress Notes (Signed)
ER nurse reported Drop in BP. He had large amount bleed again.   I went in room again. Pt is drowsy. BP- 60/40, Conjunctiva looks pale. Large amount Blood in Bed pan.   Assessment & Plan:  * Hypovolemic shock from Blood loss.  2 units PRBC stat - ordered by ER. 1 ltr NS bolus stat. I spoke to Dr. Candace Cruise on Phone about change in condition. Pt only received 1 day of Brilinta after 8 days of break. Likely source is polyp bleed.   Discussed with wife about worsening in condition and critical situation. She understands that and she is calling his daughter. Pt have a Living Will and he is DNR- confirmed with wife. We will admit and monitor in CCU.  Additional critical care time spent 40 min in care of the pt.

## 2015-06-05 NOTE — H&P (Signed)
[place] [facility] [place] NAME: Brian [name]    MR#:  [id number]  DATE OF BIRTH:  09/05/1933  DATE OF ADMISSION:  06/05/2015  PRIMARY CARE PHYSICIAN: Morton Peters, MD   REQUESTING/REFERRING PHYSICIAN: Conni Slipper  CHIEF COMPLAINT:   Chief Complaint  Patient presents with  . Rectal Bleeding    HISTORY OF PRESENT ILLNESS: Brian [name]  is a 81 y.o. male with a known history of coronary artery disease with stent placement in July 2015, was taking Brilanta - had a GI bleed in June 2016, when he was discharged after observation and advised to have procedure as outpatient. After consulting with his cardiologist- he was taken off of [place] for 5 days and colonoscopy was done on July 19 at [facility]. They saw some gastritis, diverticulosis, and removed some colonic polyps clamping. Advised him to restart Brilanta after 3 days- which he started yesterday, and today morning he had multiple episodes of bright red blood per rectum, with some clots. He also felt somewhat dizzy, and in the ER noted to have borderline low blood pressure. His hemoglobin is stable.   PAST MEDICAL HISTORY:   Past Medical History  Diagnosis Date  . Alcoholism   . Arthritis   . Depression with anxiety   . Hyperlipemia   . Hypertension   . Kidney stones   . AAA (abdominal aortic aneurysm)     2.9 CM infrarenal AAA on CT 11/2011  . Gastritis 03/2012  . 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   . Tubular adenoma of colon 07/2006    at sigmoid.   . Diverticulosis of colon 10/2009  . Positive H. pylori titer ?, before 2010    treated with Abx.   . Coronary artery disease     2 stents in july 2015    PAST SURGICAL HISTORY:  Past Surgical History  Procedure Laterality Date  . None    . Left heart catheterization with coronary angiogram Bilateral 05/29/2014    Procedure: LEFT HEART CATHETERIZATION WITH CORONARY ANGIOGRAM;  Surgeon: Laverda Page,  MD;  Location: [facility] CATH LAB;  Service: Cardiovascular;  Laterality: Bilateral;  . Percutaneous coronary stent intervention (pci-s)  05/29/2014    Procedure: PERCUTANEOUS CORONARY STENT INTERVENTION (PCI-S);  Surgeon: Laverda Page, MD;  Location: [facility] CATH LAB;  Service: Cardiovascular;;  DES x2 Prox and Mid LAD     SOCIAL HISTORY:  History  Substance Use Topics  . Smoking status: Former Smoker -- 0.50 packs/day for 60 years    Types: Cigarettes  . Smokeless tobacco: Never Used  . Alcohol Use: No    FAMILY HISTORY:  Family History  Problem Relation Age of Onset  . Ulcers Father     stomach   . Colon cancer Neg Hx     DRUG ALLERGIES: No Known Allergies  REVIEW OF SYSTEMS:   CONSTITUTIONAL: No fever, fatigue or weakness. Feels dizzi. EYES: No blurred or double vision.  EARS, NOSE, AND THROAT: No tinnitus or ear pain.  RESPIRATORY: No cough, shortness of breath, wheezing or hemoptysis.  CARDIOVASCULAR: No chest pain, orthopnea, edema.  GASTROINTESTINAL: No nausea, vomiting, diarrhea or abdominal pain. Bright red blood in stool. GENITOURINARY: No dysuria, hematuria.  ENDOCRINE: No polyuria, nocturia,  HEMATOLOGY: No anemia, easy bruising or bleeding SKIN: No rash or lesion. MUSCULOSKELETAL: No joint pain or arthritis.   NEUROLOGIC: No tingling, numbness, weakness.  PSYCHIATRY: No anxiety or depression.   MEDICATIONS AT  HOME:  Prior to Admission medications   Medication Sig Start Date End Date Taking? Authorizing Provider  aspirin 81 MG chewable tablet Chew 1 tablet (81 mg total) by mouth daily. 06/02/14  Yes Adrian Prows, MD  atorvastatin (LIPITOR) 80 MG tablet Take 1 tablet (80 mg total) by mouth daily at 6 PM. Patient taking differently: Take 80 mg by mouth every evening.  06/02/14  Yes Adrian Prows, MD  diazepam (VALIUM) 5 MG tablet Take 5 mg by mouth every 8 (eight) hours as needed for anxiety.    Yes Historical Provider, MD  lisinopril (PRINIVIL,ZESTRIL) 5 MG tablet Take 5 mg  by mouth daily. 06/03/15  Yes Historical Provider, MD  metoprolol succinate (TOPROL-XL) 25 MG 24 hr tablet Take 25 mg by mouth daily. 05/10/15  Yes Historical Provider, MD  ticagrelor (BRILINTA) 90 MG TABS tablet Take 1 tablet (90 mg total) by mouth 2 (two) times daily. 06/02/14  Yes Adrian Prows, MD  metoprolol tartrate (LOPRESSOR) 25 MG tablet Take 1 tablet (25 mg total) by mouth 2 (two) times daily. 06/02/14   Adrian Prows, MD  traMADol (ULTRAM) 50 MG tablet Take 50 mg by mouth every 6 (six) hours as needed for moderate pain.    Historical Provider, MD      PHYSICAL EXAMINATION:   VITAL SIGNS: Blood pressure 89/67, pulse 78, temperature 97.7 F (36.5 C), temperature source Oral, resp. rate 20, height 5\' 6"  (1.676 m), weight 68.04 kg (150 lb), SpO2 100 %.  GENERAL:  81 y.o.-year-old patient lying in the bed with no acute distress.  EYES: Pupils equal, round, reactive to light and accommodation. No scleral icterus. Extraocular muscles intact.  HEENT: Head atraumatic, normocephalic. Oropharynx and nasopharynx clear.  NECK:  Supple, no jugular venous distention. No thyroid enlargement, no tenderness.  LUNGS: Normal breath sounds bilaterally, no wheezing, rales,rhonchi or crepitation. No use of accessory muscles of respiration.  CARDIOVASCULAR: S1, S2 normal. No murmurs, rubs, or gallops.  ABDOMEN: Soft, nontender, nondistended. Bowel sounds present. No organomegaly or mass.  EXTREMITIES: No pedal edema, cyanosis, or clubbing.  NEUROLOGIC: Cranial nerves II through XII are intact. Muscle strength 5/5 in all extremities. Sensation intact. Gait not checked.  PSYCHIATRIC: The patient is alert and oriented x 3.  SKIN: No obvious rash, lesion, or ulcer.   LABORATORY PANEL:   CBC  Recent Labs Lab 06/05/15 1146  WBC 9.5  HGB 12.2*  HCT 36.6*  PLT 172  MCV 98.3  MCH 32.7  MCHC 33.3  RDW 13.6    ------------------------------------------------------------------------------------------------------------------  Chemistries   Recent Labs Lab 06/05/15 1146  NA 140  K 3.7  CL 110  CO2 22  GLUCOSE 203*  BUN 12  CREATININE 0.84  CALCIUM 8.6*  AST 21  ALT 13*  ALKPHOS 66  BILITOT 0.5   ------------------------------------------------------------------------------------------------------------------ estimated creatinine clearance is 63.3 mL/min (by C-G formula based on Cr of 0.84). ------------------------------------------------------------------------------------------------------------------ No results for input(s): TSH, T4TOTAL, T3FREE, THYROIDAB in the last 72 hours.  Invalid input(s): FREET3   Coagulation profile  Recent Labs Lab 06/05/15 1146  INR 1.03   ------------------------------------------------------------------------------------------------------------------- No results for input(s): DDIMER in the last 72 hours. -------------------------------------------------------------------------------------------------------------------  Cardiac Enzymes No results for input(s): CKMB, TROPONINI, MYOGLOBIN in the last 168 hours.  Invalid input(s): CK ------------------------------------------------------------------------------------------------------------------ Invalid input(s): POCBNP  ---------------------------------------------------------------------------------------------------------------  Urinalysis    Component Value Date/Time   COLORURINE Yellow 04/22/2012 0930   APPEARANCEUR Clear 04/22/2012 0930   LABSPEC 1.013 04/22/2012 0930   PHURINE 7.0 04/22/2012 0930  GLUCOSEU Negative 04/22/2012 0930   HGBUR Negative 04/22/2012 0930   BILIRUBINUR Negative 04/22/2012 0930   KETONESUR Negative 04/22/2012 0930   PROTEINUR Negative 04/22/2012 0930   NITRITE Negative 04/22/2012 0930   LEUKOCYTESUR Negative 04/22/2012 0930     IMPRESSION AND  PLAN:  * GI bleed  Most likely this is lower, from his polyps.  He had colonoscopy done on 19th of July, probably. Walls and clamping.  Restarted his Brilanta yesterday, we will stop that for now.  His PCI was one year ago , so I will call cardiologist to decide the need to [email or web address] therapy as he is having GI bleed.  For now we'll give IV PPI, and gastrology consult.  Hemoglobin is stable no need for transfusion at this point.  * Borderline hypertension  I will hold his hypertension medications for now, and will give some IV fluids.  * Coronary artery disease   Hold antiplatelets for now because of GI bleed, cardiology to decide the date for the starting this therapy.  *  Hyperlipidemia  Continue statin.  All the records are reviewed and case discussed with ED provider. Management plans discussed with the patient, family and they are in agreement.  CODE STATUS: Full.   TOTAL TIME TAKING CARE OF THIS PATIENT: 50 minutes.  More than 50% of the visit was spent in counseling/coordination of care: Ilean Skill M.D on 06/05/2015   Between 7am to 6pm - Pager - [phone]  After 6pm go to [email or web address] - password [email or web address] [place] [facility]  Office  [phone]  CC: Primary care physician; Morton Peters, MD

## 2015-06-05 NOTE — ED Notes (Signed)
Pt presents from urgent care with rectal bleed after a colonoscopy Tuesday. Pt has lost large amounts of blood this morning.

## 2015-06-05 NOTE — ED Notes (Addendum)
At 1415 hours, pt's BP dropped to 61/51, and he became nauseous and diaphoretic. MD was notified, pt put in Trendelenburg position, and fluids started while awaiting emergency blood products from blood bank. When blood products arrived, Dr Cinda Quest ordered them run in quickly using pressure bags. Two units were started and completed, and then 2 more started, as well as 1 unit of FFP.  Vital signs were monitored, and pt was placed under a bair hugger when his temperature dropped to 94.3. Pt tolerated procedure well, with moments of reduced LOC, but he was able to be awakened with voice commands. Chaplain was called to be with pt's wife outside the room, and she informed staff that the pt is a DNR. Pt asked for urinal several times and was able to urinate several times. Pt's color returned after about 45 minutes and he was more alert & aware of what was happening.

## 2015-06-05 NOTE — ED Notes (Signed)
Pt from home with rectal bleeding this am after colonoscopy on Tuesday. Pt states that he has had "large amount" of bright red blood and clots from his rectum. Pt pale upon arrival.

## 2015-06-05 NOTE — ED Notes (Signed)
Pt requested toileting; voided 150 cc of bright red blood.

## 2015-06-06 ENCOUNTER — Inpatient Hospital Stay: Payer: Medicare Other | Admitting: Anesthesiology

## 2015-06-06 ENCOUNTER — Encounter
Admission: EM | Disposition: A | Discharge status: Home / Self Care | Payer: Self-pay | Source: Home / Self Care | Attending: Internal Medicine

## 2015-06-06 ENCOUNTER — Encounter: Payer: Self-pay | Admitting: Gastroenterology

## 2015-06-06 DIAGNOSIS — I255 Ischemic cardiomyopathy: Secondary | ICD-10-CM

## 2015-06-06 HISTORY — PX: COLONOSCOPY WITH PROPOFOL: SHX5780

## 2015-06-06 LAB — BASIC METABOLIC PANEL
ANION GAP: 4 — AB (ref 5–15)
BUN: 10 mg/dL (ref 6–20)
CALCIUM: 7.6 mg/dL — AB (ref 8.9–10.3)
CO2: 25 mmol/L (ref 22–32)
Chloride: 115 mmol/L — ABNORMAL HIGH (ref 101–111)
Creatinine, Ser: 0.55 mg/dL — ABNORMAL LOW (ref 0.61–1.24)
GFR calc Af Amer: >60 mL/min (ref >60)
GFR calc non Af Amer: >60 mL/min (ref >60)
Glucose, Bld: 91 mg/dL (ref 65–99)
POTASSIUM: 3.4 mmol/L — AB (ref 3.5–5.1)
SODIUM: 144 mmol/L (ref 135–145)

## 2015-06-06 LAB — CBC
HCT: 35.1 % — ABNORMAL LOW (ref 40.0–52.0)
HCT: 37 % — ABNORMAL LOW (ref 40.0–52.0)
HEMOGLOBIN: 12 g/dL — AB (ref 13.0–18.0)
HEMOGLOBIN: 12.9 g/dL — AB (ref 13.0–18.0)
MCH: 31.8 pg (ref 26.0–34.0)
MCH: 31.8 pg (ref 26.0–34.0)
MCHC: 34.3 g/dL (ref 32.0–36.0)
MCHC: 34.9 g/dL (ref 32.0–36.0)
MCV: 92.8 fL (ref 80.0–100.0)
MCV: 91.2 fL (ref 80.0–100.0)
Platelets: 135 10*3/uL — ABNORMAL LOW (ref 150–440)
Platelets: 140 10*3/uL — ABNORMAL LOW (ref 150–440)
RBC: 3.78 MIL/uL — AB (ref 4.40–5.90)
RBC: 4.06 MIL/uL — ABNORMAL LOW (ref 4.40–5.90)
RDW: 14.6 % — ABNORMAL HIGH (ref 11.5–14.5)
RDW: 14.5 % (ref 11.5–14.5)
WBC: 10 10*3/uL (ref 3.8–10.6)
WBC: 8.4 10*3/uL (ref 3.8–10.6)

## 2015-06-06 LAB — PREPARE FRESH FROZEN PLASMA
UNIT DIVISION: 0
Unit division: 0
Unit division: 0
Unit division: 0

## 2015-06-06 LAB — PREPARE PLATELET PHERESIS
UNIT DIVISION: 0
Unit division: 0

## 2015-06-06 SURGERY — COLONOSCOPY WITH PROPOFOL
Anesthesia: General | Laterality: Left

## 2015-06-06 MED ORDER — FENTANYL CITRATE (PF) 100 MCG/2ML IJ SOLN: 25.0000 ug | INTRAMUSCULAR | Status: DC | PRN

## 2015-06-06 MED ORDER — PROPOFOL 10 MG/ML IV BOLUS
INTRAVENOUS | Status: DC | PRN
  Administered 2015-06-06 (×4): 50 mg via INTRAVENOUS

## 2015-06-06 MED ORDER — PHENYLEPHRINE HCL 10 MG/ML IJ SOLN
INTRAMUSCULAR | Status: DC | PRN
  Administered 2015-06-06: 200 ug via INTRAVENOUS

## 2015-06-06 MED ORDER — ONDANSETRON HCL 4 MG/2ML IJ SOLN: 4.0000 mg | Freq: Once | INTRAMUSCULAR | Status: DC | PRN

## 2015-06-06 NOTE — Op Note (Signed)
Blood everywhere but no active bleeding. Diffuse tics but no obvious bleeding from any one of the tics. Cecal area ulcerated from previous polypectomy. NO active bleeding. Placed 2 more hemoclips at exposed ulcerated areas. Start clears. Watch for any rebleeding. Will follow. thanks

## 2015-06-06 NOTE — Progress Notes (Addendum)
° °  [facility]  Daily Progress Note  Patient getting endoscopy / colonoscopy - unable to examine.   Objective: Filed Vitals:   06/06/15 0800 06/06/15 0900 06/06/15 1115 06/06/15 1151  BP: 128/80 119/79 128/71 150/95  Pulse: 99 85  63  Temp:   97.3 F (36.3 C)   TempSrc:      Resp: 19 24  14   Height:      Weight:      SpO2: 93% 92%  100%    Intake/Output Summary (Last 24 hours) at 06/06/15 1200 Last data filed at 06/06/15 1148  Gross per 24 hour  Intake 1358.17 ml  Output   1770 ml  Net -411.83 ml   Laboratory: CBC    Component Value Date/Time   WBC 8.4 06/06/2015 0857   WBC 8.3 04/15/2012 0210   HGB 12.9* 06/06/2015 0857   HGB 15.2 04/15/2012 0210   HCT 37.0* 06/06/2015 0857   HCT 45.2 04/15/2012 0210   PLT 140* 06/06/2015 0857   PLT 149* 04/15/2012 0210    BMET    Component Value Date/Time   NA 144 06/06/2015 0503   NA 143 04/15/2012 0210   K 3.4* 06/06/2015 0503   K 3.6 04/15/2012 0210   CL 115* 06/06/2015 0503   CL 105 04/15/2012 0210   CO2 25 06/06/2015 0503   CO2 24 04/15/2012 0210   GLUCOSE 91 06/06/2015 0503   GLUCOSE 75 04/15/2012 0210   BUN 10 06/06/2015 0503   BUN 14 04/15/2012 0210   CREATININE 0.55* 06/06/2015 0503   CREATININE 0.80 04/15/2012 0210   CALCIUM 7.6* 06/06/2015 0503   CALCIUM 7.8* 04/15/2012 0210   GFRNONAA >60 06/06/2015 0503   GFRNONAA >60 04/15/2012 0210   GFRAA >60 06/06/2015 0503   GFRAA >60 04/15/2012 0210    Assessment/Planning: The patient is a 81 y.o. male hospitalized with LGI bleeding. Had an episode of bleeding last month with subsequent EGD/colon on 7/16 in [place]. Diffuse tics. Multiple polyps seen. Large cecal polyp removed with 4 hemoclips placed. Was off Brilinta for 5 days prior to colonoscopy and then another three days before restarting Brilinta. Initially admitted for observation however experienced significant bleeding with drop in BP. Patient received 6 units of PRBC, 2 units  of FFP, and 2 units of PLTC. Bleeding scan last night neg. Still passing old blood. This AM BP stable and HGB above 12. Patient in endoscopy this AM - awaiting results.   1) Bleeding scan negative at one hour then three hours. No indication for embolization.  2) Agree with EDG  / Colonoscopy to assess for bleeding site. 3) Will continue to follow. 4) Discussed with Dr. Delana Meyer.   Marcelle Overlie PA-C 06/06/2015 12:00 PM

## 2015-06-06 NOTE — Consult Note (Addendum)
GI Inpatient Consult Note  Reason for Consult:   Attending Requesting Consult:  History of Present Illness: Brian [name] is a 81 y.o. male with severe LGI bleeding. Had an episode of bleeding last month. Had EGD/colon on 7/16 in [place]. Diffuse tics. Multiple polyps seen. Large cecal polyp removed with 4 hemoclips placed. Was off brilinta x 5 days prior to colon and then another 3 days before restarting brilinta. Took 2nd dose yesterday AM before having hematochezia. Initially admitted for observation. Then, signif bleeding with drop in BP. Ended up getting 6 units of PRBC, 2 units of FFP, and 2 units of PLTC. Bleeding scan last night neg. Still passing old blood. Takes bASA daily as well. This AM, BP stable. HGB above 12. Some abdominal cramping. No CP. Had 2 cardiac stents placed over a year ago.  Past Medical History:  Past Medical History  Diagnosis Date  . Alcoholism   . Arthritis   . Depression with anxiety   . Hyperlipemia   . Hypertension   . Kidney stones   . AAA (abdominal aortic aneurysm)     2.9 CM infrarenal AAA on CT 11/2011  . Gastritis 03/2012  . 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   . Tubular adenoma of colon 07/2006    at sigmoid.   . Diverticulosis of colon 10/2009  . Positive H. pylori titer ?, before 2010    treated with Abx.   . Coronary artery disease     2 stents in july 2015    Problem List: Patient Active Problem List   Diagnosis Date Noted  . GI bleed 06/05/2015  . Hypovolemic shock 06/05/2015  . History of colonic polyps 05/14/2015  . Rectal bleeding 05/14/2015  . Encounter for long-term (current) use of antiplatelets/antithrombotics 05/14/2015  . History of GI diverticular bleed 05/04/2015  . GIB (gastrointestinal bleeding) 05/03/2015  . Acute anterior wall MI 05/29/2014  . H. pylori infection 02/21/2012  . Dyspepsia 02/21/2012  . Hx of adenomatous colonic polyps 02/21/2012  . Anxiety and depression 02/21/2012  . HTN  (hypertension) 02/21/2012  . Hyperlipidemia 02/21/2012  . Renal stones 02/21/2012    Past Surgical History: Past Surgical History  Procedure Laterality Date  . None    . Left heart catheterization with coronary angiogram Bilateral 05/29/2014    Procedure: LEFT HEART CATHETERIZATION WITH CORONARY ANGIOGRAM;  Surgeon: Laverda Page, MD;  Location: [facility] CATH LAB;  Service: Cardiovascular;  Laterality: Bilateral;  . Percutaneous coronary stent intervention (pci-s)  05/29/2014    Procedure: PERCUTANEOUS CORONARY STENT INTERVENTION (PCI-S);  Surgeon: Laverda Page, MD;  Location: [facility] CATH LAB;  Service: Cardiovascular;;  DES x2 Prox and Mid LAD     Allergies: No Known Allergies  Home Medications: Prescriptions prior to admission  Medication Sig Dispense Refill Last Dose  . aspirin 81 MG chewable tablet Chew 1 tablet (81 mg total) by mouth daily.   06/05/2015 at Unknown time  . atorvastatin (LIPITOR) 80 MG tablet Take 1 tablet (80 mg total) by mouth daily at 6 PM. (Patient taking differently: Take 80 mg by mouth every evening. ) 30 tablet 1 06/04/2015 at Unknown time  . diazepam (VALIUM) 5 MG tablet Take 5 mg by mouth every 8 (eight) hours as needed for anxiety.    06/04/2015 at Unknown time  . lisinopril (PRINIVIL,ZESTRIL) 5 MG tablet Take 5 mg by mouth daily.   06/05/2015 at Unknown time  . metoprolol succinate (TOPROL-XL) 25 MG 24 hr tablet Take  25 mg by mouth daily.   06/05/2015 at 0600  . ticagrelor (BRILINTA) 90 MG TABS tablet Take 1 tablet (90 mg total) by mouth 2 (two) times daily. 60 tablet 0 06/05/2015 at Unknown time  . metoprolol tartrate (LOPRESSOR) 25 MG tablet Take 1 tablet (25 mg total) by mouth 2 (two) times daily. 30 tablet 1 06/01/2015  . traMADol (ULTRAM) 50 MG tablet Take 50 mg by mouth every 6 (six) hours as needed for moderate pain.   06/03/2015   Home medication reconciliation was completed with the patient.   Scheduled Inpatient Medications:   . atorvastatin  80 mg Oral  QPM  . pantoprazole (PROTONIX) IV  40 mg Intravenous Q12H    Continuous Inpatient Infusions:   . sodium chloride 10 mL/hr (06/05/15 2241)    PRN Inpatient Medications:    Family History: family history includes Ulcers in his father. There is no history of Colon cancer.  The patient's family history is negative for inflammatory bowel disorders, GI malignancy, or solid organ transplantation.  Social History:   reports that he has quit smoking. His smoking use included Cigarettes. He has a 30 pack-year smoking history. He has never used smokeless tobacco. He reports that he does not drink alcohol or use illicit drugs. The patient denies ETOH, tobacco, or drug use.   Review of Systems: Constitutional: Weight is stable.  Eyes: No changes in vision. ENT: No oral lesions, sore throat.  GI: see HPI.  Heme/Lymph: No easy bruising.  CV: No chest pain.  GU: No hematuria.  Integumentary: No rashes.  Neuro: No headaches.  Psych: No depression/anxiety.  Endocrine: No heat/cold intolerance.  Allergic/Immunologic: No urticaria.  Resp: No cough, SOB.  Musculoskeletal: No joint swelling.    Physical Examination: BP 141/81 mmHg  Pulse 90  Temp(Src) 98 F (36.7 C) (Axillary)  Resp 16  Ht 5\' 6"  (1.676 m)  Wt 68.4 kg (150 lb 12.7 oz)  BMI 24.35 kg/m2  SpO2 95% Gen: NAD, alert and oriented x 4 HEENT: PEERLA, EOMI, Neck: supple, no JVD or thyromegaly Chest: CTA bilaterally, no wheezes, crackles, or other adventitious sounds CV: RRR, no m/g/c/r Abd: soft, NT, ND, +BS in all four quadrants; no HSM, guarding, ridigity, or rebound tenderness Ext: no edema, well perfused with 2+ pulses, Skin: no rash or lesions noted Lymph: no LAD  Data: Lab Results  Component Value Date   WBC 10.0 06/06/2015   HGB 12.0* 06/06/2015   HCT 35.1* 06/06/2015   MCV 92.8 06/06/2015   PLT 135* 06/06/2015    Recent Labs Lab 06/05/15 1537 06/05/15 2104 06/06/15 0503  HGB 12.4* 12.7* 12.0*   Lab  Results  Component Value Date   NA 144 06/06/2015   K 3.4* 06/06/2015   CL 115* 06/06/2015   CO2 25 06/06/2015   BUN 10 06/06/2015   CREATININE 0.55* 06/06/2015   Lab Results  Component Value Date   ALT 13* 06/05/2015   AST 21 06/05/2015   ALKPHOS 66 06/05/2015   BILITOT 0.5 06/05/2015    Recent Labs Lab 06/05/15 1146  INR 1.03   Assessment/Plan: Brian [name] is a 81 y.o. male with LGI bleeding, exacerbated by anticoagulant. Likely bled from cecal polypectomy site though diverticular bleeding also possible.   Recommendations: Tap enemas this AM. Plan colonoscopy this AM. If bleeding from cecum, more clips will be placed. If bleeding from tics, then embolization more beneficial IF bleeding site can be found. Thank you for the consult. Please call with questions or  concerns.  Anjel Perfetti, Lupita Dawn, MD

## 2015-06-06 NOTE — Progress Notes (Addendum)
[place] [facility] [place] NAME: Brian [name]    MR#:  [id number]  DATE OF BIRTH:  Mar 27, 1933  SUBJECTIVE:  Patient briefly seen in ENDO suite going for c/s. Nurse reports ongoing bloody BM,  REVIEW OF SYSTEMS:    Review of Systems  Unable to perform ROS   Tolerating Diet:NPO      DRUG ALLERGIES:  No Known Allergies  VITALS:  Blood pressure 119/79, pulse 85, temperature 98 F (36.7 C), temperature source Axillary, resp. rate 24, height 5\' 6"  (1.676 m), weight 68.4 kg (150 lb 12.7 oz), SpO2 92 %.  PHYSICAL EXAMINATION:   Physical Exam  Constitutional: He is well-developed, well-nourished, and in no distress.  Cardiovascular: Normal rate.   Pulmonary/Chest: Effort normal.  Neurological:  drowsy      LABORATORY PANEL:   CBC  Recent Labs Lab 06/06/15 0857  WBC 8.4  HGB 12.9*  HCT 37.0*  PLT 140*   ------------------------------------------------------------------------------------------------------------------  Chemistries   Recent Labs Lab 06/05/15 1146 06/06/15 0503  NA 140 144  K 3.7 3.4*  CL 110 115*  CO2 22 25  GLUCOSE 203* 91  BUN 12 10  CREATININE 0.84 0.55*  CALCIUM 8.6* 7.6*  AST 21  --   ALT 13*  --   ALKPHOS 66  --   BILITOT 0.5  --    ------------------------------------------------------------------------------------------------------------------  Cardiac Enzymes No results for input(s): TROPONINI in the last 168 hours. ------------------------------------------------------------------------------------------------------------------  RADIOLOGY:  Nm Gi Blood Loss  06/05/2015     IMPRESSION: No source of active gastrointestinal bleeding is identified.  Results were telephoned to Dr. Ella Jubilee at the time of interpretation on 06/05/2015 at 2030 hr.   Electronically Signed   By: Evangeline Dakin M.D.   On: 06/05/2015 20:35     ASSESSMENT AND PLAN:   81 y.o. male presented with  LGI bleeding, exacerbated by anticoagulant.   1. GIB: Patient likley has a bleed from cecal polypectomy. He has received 6 units total PRBC. He is going for colonscopy now. GIB scan negative.  2. Acute blood loss anemia : Patient had 6 units of blood. His Hgb is stable at 12.  3. Hypovolemic shock from LGIB: Continue transfusion as needed  4. HLD: on statin  5. CAD: holding AntiPlt agent for now due to above issues     Management plans discussed with the nurse  CODE STATUS: DNR  TOTAL TIME TAKING CARE OF THIS PATIENT: 35 minutes.     Greater than 50% counseling and coordination of care  POSSIBLE D/C 3-4 days , DEPENDING ON CLINICAL CONDITION.   Apple Dearmas M.D on 06/06/2015 at 11:12 AM  Between 7am to 6pm - Pager - [phone] After 6pm go to [email or web address] - password [email or web address] [facility]  Office  [phone]  CC: Primary care physician; Morton Peters, MD

## 2015-06-06 NOTE — Consult Note (Signed)
Reason for Consult:GI bleeding in a patient with CAD, s/p stenting  Referring Physician: Dr. Lars Pinks Brian [name] is an 81 y.o. male.   HPI: The patient is an 81 year old man with a history of coronary artery disease, status post anterior myocardial infarction 1 year ago.  At that time he underwent stenting of the left anterior descending coronary artery.  Postprocedure, he had severe left ventricular dysfunction with an ejection fraction of 15% by echo.  The patient presented to the hospital with gastrointestinal bleeding and anemia.  He is undergone transfusion and GI evaluation.  Colonoscopy has just been performed, and I do not yet have the results.  His heart failure has been fairly well controlled.  PMH: Past Medical History  Diagnosis Date  . Alcoholism   . Arthritis   . Depression with anxiety   . Hyperlipemia   . Hypertension   . Kidney stones   . AAA (abdominal aortic aneurysm)     2.9 CM infrarenal AAA on CT 11/2011  . Gastritis 03/2012  . 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   . Tubular adenoma of colon 07/2006    at sigmoid.   . Diverticulosis of colon 10/2009  . Positive H. pylori titer ?, before 2010    treated with Abx.   . Coronary artery disease     2 stents in july 2015    PSHX: Past Surgical History  Procedure Laterality Date  . None    . Left heart catheterization with coronary angiogram Bilateral 05/29/2014    Procedure: LEFT HEART CATHETERIZATION WITH CORONARY ANGIOGRAM;  Surgeon: Laverda Page, MD;  Location: [facility] CATH LAB;  Service: Cardiovascular;  Laterality: Bilateral;  . Percutaneous coronary stent intervention (pci-s)  05/29/2014    Procedure: PERCUTANEOUS CORONARY STENT INTERVENTION (PCI-S);  Surgeon: Laverda Page, MD;  Location: [facility] CATH LAB;  Service: Cardiovascular;;  DES x2 Prox and Mid LAD     FAMHX: Family History  Problem Relation Age of Onset  . Ulcers Father     stomach   . Colon cancer Neg Hx     Social History:   reports that he has quit smoking. His smoking use included Cigarettes. He has a 30 pack-year smoking history. He has never used smokeless tobacco. He reports that he does not drink alcohol or use illicit drugs.  Allergies: No Known Allergies  Medications: I have reviewed the patient's current medications.  Nm Gi Blood Loss  06/05/2015   CLINICAL DATA:  81 year old male with coronary artery disease and stent placement in July, 2015 for which he was placed on anticoagulation. He presented with GI bleeding June, 2016, and was discharged after observation. He was taken off of his anticoagulation for 5 days and colonoscopy was performed 06/01/2015 which revealed diverticulosis and colonic polyps, some of which were removed. Patient restarted his anticoagulation yesterday and today he had multiple episodes of bright red blood per rectum with clots.  EXAM: NUCLEAR MEDICINE GASTROINTESTINAL BLEEDING SCAN  TECHNIQUE: Sequential abdominal images were obtained following intravenous administration of Tc-15m labeled red blood cells.  RADIOPHARMACEUTICALS:  21.5 mCi Tc-74m in-vitro labeled red cells.  COMPARISON:  None.  FINDINGS: Imaging in the AP projection was carried out to 2 hours and lateral images were obtained at 1 hour and 2 hr. Midline low pelvic activity on the images up to 1 hour were performed. The lateral image shows that the activity is anterior, and the technologist states that the patient had multiple episodes of  urination during the procedure with a urinal in place, accounting for the activity. No bowel activity is identified on the images up to 1 hour.  The 2 hour AP images demonstrate activity within the urinary bladder. The activity within the urinal is again noted. The lateral to our image demonstrates emptying of the urinary bladder. Again, no bowel activity is identified on the images up to 2 hr.  Expected blood pool activity and excretion into the urinary tract is present on all images.   IMPRESSION: No source of active gastrointestinal bleeding is identified.  Results were telephoned to Dr. Ella Jubilee at the time of interpretation on 06/05/2015 at 2030 hr.   Electronically Signed   By: Evangeline Dakin M.D.   On: 06/05/2015 20:35    ROS  As stated in the HPI and negative for all other systems.  Physical Exam  Vitals:Blood pressure 119/79, pulse 85, temperature 98 F (36.7 C), temperature source Axillary, resp. rate 24, height 5\' 6"  (1.676 m), weight 150 lb 12.7 oz (68.4 kg), SpO2 92 %.  Somnolent appearing 81 year old man, NAD HEENT: Unremarkable Neck:  7 cm JVD, no thyromegally Back:  No CVA tenderness Lungs:  Clear, with no wheezes, rales, or rhonchi. HEART:  Regular rate rhythm, no murmurs, no rubs, no clicks Abd:  Flat, positive bowel sounds, no organomegally, no rebound, no guarding Ext:  2 plus pulses, no edema, no cyanosis, no clubbing Skin:  No rashes no nodules Neuro:  CN II through XII intact, motor grossly intact  ECG - normal sinus rhythm  Assessment/Plan:  1.  Acute lower GI bleeding 2.  Coronary artery disease, status post prior anterior MI, status post stenting, on Brilinta 3.  Anemia secondary to #1 Recommendation: Stop Brilinta and do not restart. Restart ASA when felt to be safe by GI service. He is a year out from his stent. Once stable, he will need a beta blocker and ACE inhibitor/ARB and statin.   Carleene Overlie TaylorMD 06/06/2015, 11:11 AM

## 2015-06-06 NOTE — Progress Notes (Signed)
Pt and family wished to change code status from DNR to full code.  Dr. Margaretmary Eddy was paged and made aware.  Pt changed to full code.

## 2015-06-06 NOTE — Anesthesia Preprocedure Evaluation (Addendum)
Anesthesia Evaluation  Patient identified by MRN, date of birth, ID band Patient awake    Reviewed: Allergy & Precautions, NPO status , Patient's Chart, lab work & pertinent test results, reviewed documented beta blocker date and time   Airway Mallampati: II  TM Distance: >3 FB     Dental  (+) Chipped   Pulmonary former smoker,          Cardiovascular hypertension, + CAD, + Past MI and + Peripheral Vascular Disease     Neuro/Psych PSYCHIATRIC DISORDERS    GI/Hepatic   Endo/Other    Renal/GU Renal disease     Musculoskeletal  (+) Arthritis -,   Abdominal   Peds  Hematology   Anesthesia Other Findings   Reproductive/Obstetrics                           Anesthesia Physical Anesthesia Plan  ASA: III  Anesthesia Plan: General   Post-op Pain Management:    Induction: Intravenous  Airway Management Planned: Nasal Cannula  Additional Equipment:   Intra-op Plan:   Post-operative Plan:   Informed Consent: I have reviewed the patients History and Physical, chart, labs and discussed the procedure including the risks, benefits and alternatives for the proposed anesthesia with the patient or authorized representative who has indicated his/her understanding and acceptance.     Plan Discussed with: CRNA  Anesthesia Plan Comments:         Anesthesia Quick Evaluation

## 2015-06-06 NOTE — Transfer of Care (Addendum)
Immediate Anesthesia Transfer of Care Note  Patient: Brian [name]  Procedure(s) Performed: Procedure(s): COLONOSCOPY WITH PROPOFOL (Left)  Patient Location: PACU  Anesthesia Type:General  Level of Consciousness: sedated  Airway & Oxygen Therapy: Patient Spontanous Breathing and Patient connected to nasal cannula oxygen  Post-op Assessment: Report given to RN and Post -op Vital signs reviewed and stable  Post vital signs: Reviewed and stable  Last Vitals:  Filed Vitals:   06/06/15 0900  BP: 119/79  Pulse: 85  Temp:   Resp: 24    Complications: No apparent anesthesia complications

## 2015-06-06 NOTE — Progress Notes (Signed)
A total of 6 units of prbc's, 2 units of ffp and 2 units of platelets were given to pt.

## 2015-06-06 NOTE — Anesthesia Postprocedure Evaluation (Signed)
  Anesthesia Post-op Note  Patient: Brian [name]  Procedure(s) Performed: Procedure(s): COLONOSCOPY WITH PROPOFOL (Left)  Anesthesia type:General  Patient location: PACU  Post pain: Pain level controlled  Post assessment: Post-op Vital signs reviewed, Patient's Cardiovascular Status Stable, Respiratory Function Stable, Patent Airway and No signs of Nausea or vomiting  Post vital signs: Reviewed and stable  Last Vitals:  Filed Vitals:   06/06/15 1151  BP: 150/95  Pulse: 63  Temp:   Resp: 14    Level of consciousness: awake, alert  and patient cooperative  Complications: No apparent anesthesia complications

## 2015-06-07 ENCOUNTER — Encounter: Payer: Self-pay | Admitting: Gastroenterology

## 2015-06-07 DIAGNOSIS — R571 Hypovolemic shock: Secondary | ICD-10-CM

## 2015-06-07 DIAGNOSIS — I2510 Atherosclerotic heart disease of native coronary artery without angina pectoris: Secondary | ICD-10-CM

## 2015-06-07 DIAGNOSIS — E440 Moderate protein-calorie malnutrition: Secondary | ICD-10-CM | POA: Insufficient documentation

## 2015-06-07 LAB — CBC WITH DIFFERENTIAL/PLATELET
Basophils Absolute: 0.2 10*3/uL — ABNORMAL HIGH (ref 0–0.1)
Basophils Relative: 3 %
Eosinophils Absolute: 0.4 10*3/uL (ref 0–0.7)
Eosinophils Relative: 5 %
HCT: 35.8 % — ABNORMAL LOW (ref 40.0–52.0)
HEMOGLOBIN: 12.4 g/dL — AB (ref 13.0–18.0)
LYMPHS PCT: 17 %
Lymphs Abs: 1.3 10*3/uL (ref 1.0–3.6)
MCH: 32.1 pg (ref 26.0–34.0)
MCHC: 34.7 g/dL (ref 32.0–36.0)
MCV: 92.7 fL (ref 80.0–100.0)
MONO ABS: 0.5 10*3/uL (ref 0.2–1.0)
Monocytes Relative: 7 %
Neutro Abs: 5.3 10*3/uL (ref 1.4–6.5)
Neutrophils Relative %: 68 %
PLATELETS: 135 10*3/uL — AB (ref 150–440)
RBC: 3.86 MIL/uL — ABNORMAL LOW (ref 4.40–5.90)
RDW: 14.1 % (ref 11.5–14.5)
WBC: 7.7 10*3/uL (ref 3.8–10.6)

## 2015-06-07 MED ORDER — ENSURE ENLIVE PO LIQD
237.0000 mL | Freq: Three times a day (TID) | ORAL | Status: DC
  Administered 2015-06-07 – 2015-06-08 (×2): 237 mL via ORAL

## 2015-06-07 NOTE — Progress Notes (Signed)
Initial Nutrition Assessment  DOCUMENTATION CODES:   Non-severe (moderate) malnutrition in context of chronic illness  INTERVENTION:   Medical Food Supplement Therapy: recommend Ensure Enlive po TID, each supplement provides 350 kcal and 20 grams of protein Meals and Snacks: Cater to patient preferences   NUTRITION DIAGNOSIS:   Inadequate oral intake related to poor appetite as evidenced by mild depletion of body fat, mild depletion of muscle mass, moderate depletion of body fat, per patient/family report.  GOAL:   Patient will meet greater than or equal to 90% of their needs  MONITOR:    (Energy Intake, Anthropometrics, Digestive System, Electrolyte/Renal Profile)  REASON FOR ASSESSMENT:   Malnutrition Screening Tool    ASSESSMENT:    Pt admitted with GI bleed, hypovolemic shock; received 6 units of blood along with FFP; s/p EGD with 2 hemoclips placed at ulcerated areas  Past Medical History  Diagnosis Date  . Alcoholism   . Arthritis   . Depression with anxiety   . Hyperlipemia   . Hypertension   . Kidney stones   . AAA (abdominal aortic aneurysm)     2.9 CM infrarenal AAA on CT 11/2011  . Gastritis 03/2012  . 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   . Tubular adenoma of colon 07/2006    at sigmoid.   . Diverticulosis of colon 10/2009  . Positive H. pylori titer ?, before 2010    treated with Abx.   . Coronary artery disease     2 stents in july 2015     Diet Order:  DIET SOFT Room service appropriate?: Yes; Fluid consistency:: Thin   Energy Intake: pt tolerated CL tray this AM, appetite fair  Food and nutrition related history: report appetite has been down for at least 1 year; pt typically eats 1 good meal per day (dinner time), drinks 2 Ensure per day  Skin:  Reviewed, no issues  Digestive System: no problems chewing or swallowing, no N/V  Electrolyte and Renal Profile:  Recent Labs Lab 06/05/15 1146 06/06/15 0503  BUN 12 10   CREATININE 0.84 0.55*  NA 140 144  K 3.7 3.4*   Glucose Profile: No results for input(s): GLUCAP in the last 72 hours. Protein Profile:   Recent Labs Lab 06/05/15 1146  ALBUMIN 3.4*    Nutrition Focused Physical Exam: Nutrition-Focused physical exam completed. Findings are mild fat depletion, mild to moderate muscle depletion, and no edema.    Height:   Ht Readings from Last 1 Encounters:  06/05/15 5\' 6"  (1.676 m)    Weight: pt reports weight loss but reports UBW around 150 pounds  Wt Readings from Last 1 Encounters:  06/05/15 150 lb 12.7 oz (68.4 kg)    Filed Weights   06/05/15 1138 06/05/15 2043  Weight: 150 lb (68.04 kg) 150 lb 12.7 oz (68.4 kg)    Wt Readings from Last 10 Encounters:  06/05/15 150 lb 12.7 oz (68.4 kg)  06/01/15 144 lb (65.318 kg)  05/14/15 144 lb 8 oz (65.545 kg)  05/03/15 139 lb 12.8 oz (63.413 kg)  06/02/14 154 lb 12.2 oz (70.2 kg)  02/04/13 151 lb (68.493 kg)  01/01/13 154 lb (69.854 kg)  12/24/12 154 lb 3.2 oz (69.945 kg)  11/15/12 156 lb 12.8 oz (71.124 kg)  03/26/12 149 lb (67.586 kg)    BMI:  Body mass index is 24.35 kg/(m^2).  Estimated Nutritional Needs:   Kcal:  1732-2076 kcals (BEE 1331, 1.3 AF, 1.0-1.2 IF) using wt of 68.4  kg  Protein:  68-82 g (1.0-1.2 g/kg)   Fluid:  2040-2380 mL (30-35 ml/kg)      MODERATE Care Level  Kerman Passey MS, RD, LDN [phone] Pager

## 2015-06-07 NOTE — Progress Notes (Signed)
Pt lying in bed resting quietly . Pt has had no active bleeding no c/o pain or distress noted.

## 2015-06-07 NOTE — Care Management Note (Signed)
Case Management Note  Patient Details  Name: Brian [name] MRN: [id number] Date of Birth: [birth year]  Subjective/Objective:    Anticipate that Brian [name] will be transferred to [name] medical floor bed today. Discussed Discharge planning with Brian [name] and his daughter, and they chose Brian [name] to be Brian [name] home health provider. However, daughter stated that if she changed her mind about choosing Brian [name] that she would let us know. Brian [name] states "I have no home equipment, I gave it all away thinking that I would not need it."   Pharmacy=[facility] on [address]. PCP=Brian [name] in [place].   Anticipate home with home health.            Action/Plan:   Expected Discharge Date:                  Expected Discharge Plan:     In-House Referral:     Discharge planning Services     Post Acute Care Choice:    Choice offered to:     DME Arranged:    DME Agency:     HH Arranged:    [place] Agency:     Status of Service:     Medicare Important Message Given:  Yes-second notification given Date Medicare IM Given:    Medicare IM give by:    Date Additional Medicare IM Given:    Additional Medicare Important Message give by:     If discussed at [place] of Stay Meetings, dates discussed:    Additional Comments:  Brian Aaron A, RN 06/07/2015, 1:24 PM

## 2015-06-07 NOTE — Progress Notes (Signed)
Patient: Brian [name] / Admit Date: 06/05/2015 / Date of Encounter: 06/07/2015, 7:51 AM   Subjective: He feels well this morning. Colonoscopy pending. HGB stable. No BM since colonoscopy. No chest pain, SOB, or palpitations. Up sitting in bed watching TV.   Review of Systems: Review of Systems  Constitutional: Positive for malaise/fatigue. Negative for fever, chills, weight loss and diaphoresis.  HENT: Negative for congestion.   Eyes: Negative for blurred vision, discharge and redness.  Respiratory: Negative for cough, hemoptysis, sputum production, shortness of breath and wheezing.   Cardiovascular: Negative for chest pain, palpitations, orthopnea, claudication, leg swelling and PND.  Gastrointestinal: Negative for heartburn, nausea, vomiting, abdominal pain, diarrhea, constipation, blood in stool and melena.  Genitourinary: Negative for hematuria.  Musculoskeletal: Negative for falls.  Skin: Negative for rash.  Neurological: Positive for weakness. Negative for speech change and focal weakness.  Endo/Heme/Allergies: Does not bruise/bleed easily.  Psychiatric/Behavioral: The patient is not nervous/anxious.   All other systems reviewed and are negative.    Objective: Telemetry: NSR, 60's, occasional PAC's, PVC's ventricular bigeminy, atrial tachycardia early afternoon on 7/24, none since  Physical Exam: Blood pressure 100/67, pulse 78, temperature 97.8 F (36.6 C), temperature source Oral, resp. rate 21, height 5\' 6"  (1.676 m), weight 150 lb 12.7 oz (68.4 kg), SpO2 93 %. Body mass index is 24.35 kg/(m^2). General: Well developed, well nourished, in no acute distress. Head: Normocephalic, atraumatic, sclera non-icteric, no xanthomas, nares are without discharge. Neck: Negative for carotid bruits. JVP not elevated. Lungs: Clear bilaterally to auscultation without wheezes, rales, or rhonchi. Breathing is unlabored. Heart: RRR S1 S2 without murmurs, rubs, or gallops.  Abdomen: Soft,  non-tender, non-distended with normoactive bowel sounds. No rebound/guarding. Extremities: No clubbing or cyanosis. No edema. Distal pedal pulses are 2+ and equal bilaterally. Neuro: Alert and oriented X 3. Moves all extremities spontaneously. Psych:  Responds to questions appropriately with a normal affect.   Intake/Output Summary (Last 24 hours) at 06/07/15 0751 Last data filed at 06/07/15 0500  Gross per 24 hour  Intake    700 ml  Output   2540 ml  Net  -1840 ml    Inpatient Medications:  . atorvastatin  80 mg Oral QPM  . pantoprazole (PROTONIX) IV  40 mg Intravenous Q12H   Infusions:  . sodium chloride 10 mL/hr (06/05/15 2241)    Labs:  Recent Labs  06/05/15 1146 06/06/15 0503  NA 140 144  K 3.7 3.4*  CL 110 115*  CO2 22 25  GLUCOSE 203* 91  BUN 12 10  CREATININE 0.84 0.55*  CALCIUM 8.6* 7.6*    Recent Labs  06/05/15 1146  AST 21  ALT 13*  ALKPHOS 66  BILITOT 0.5  PROT 6.3*  ALBUMIN 3.4*    Recent Labs  06/05/15 1537  06/06/15 0857 06/07/15 0508  WBC 11.1*  < > 8.4 7.7  NEUTROABS 9.9*  --   --  5.3  HGB 12.4*  < > 12.9* 12.4*  HCT 37.4*  < > 37.0* 35.8*  MCV 94.9  < > 91.2 92.7  PLT 89*  < > 140* 135*  < > = values in this interval not displayed. No results for input(s): CKTOTAL, CKMB, TROPONINI in the last 72 hours. Invalid input(s): POCBNP No results for input(s): HGBA1C in the last 72 hours.   Weights: Filed Weights   06/05/15 1138 06/05/15 2043  Weight: 150 lb (68.04 kg) 150 lb 12.7 oz (68.4 kg)     Radiology/Studies:  Nm Gi Blood Loss  06/05/2015   CLINICAL DATA:  81 year old male with coronary artery disease and stent placement in July, 2015 for which he was placed on anticoagulation. He presented with GI bleeding June, 2016, and was discharged after observation. He was taken off of his anticoagulation for 5 days and colonoscopy was performed 06/01/2015 which revealed diverticulosis and colonic polyps, some of which were removed.  Patient restarted his anticoagulation yesterday and today he had multiple episodes of bright red blood per rectum with clots.  EXAM: NUCLEAR MEDICINE GASTROINTESTINAL BLEEDING SCAN  TECHNIQUE: Sequential abdominal images were obtained following intravenous administration of Tc-57m labeled red blood cells.  RADIOPHARMACEUTICALS:  21.5 mCi Tc-98m in-vitro labeled red cells.  COMPARISON:  None.  FINDINGS: Imaging in the AP projection was carried out to 2 hours and lateral images were obtained at 1 hour and 2 hr. Midline low pelvic activity on the images up to 1 hour were performed. The lateral image shows that the activity is anterior, and the technologist states that the patient had multiple episodes of urination during the procedure with a urinal in place, accounting for the activity. No bowel activity is identified on the images up to 1 hour.  The 2 hour AP images demonstrate activity within the urinary bladder. The activity within the urinal is again noted. The lateral to our image demonstrates emptying of the urinary bladder. Again, no bowel activity is identified on the images up to 2 hr.  Expected blood pool activity and excretion into the urinary tract is present on all images.  IMPRESSION: No source of active gastrointestinal bleeding is identified.  Results were telephoned to Dr. Ella Jubilee at the time of interpretation on 06/05/2015 at 2030 hr.   Electronically Signed   By: Evangeline Dakin M.D.   On: 06/05/2015 20:35     Assessment and Plan  1. Acute lower GI bleeding: -Likely from cecal polypectomy s/p 2 hemoclips -Bleeding scan negative -Status post 6 units of pRBC -HGB stable at 12.4  2. Coronary artery disease, status post prior anterior MI, status post stenting, on Brilinta -Brilinta stopped given it has been 1 year from most recent stenting (05/29/2014) -Restart aspirin 81 mg when felt safe by GI -Continue Lipitor 80 mg daily -Restart metoprolol and ACEi as below when stable   3.  Ischemic cardiomyopathy: -Echo from 05/29/2014 showed EF 10-15% -Patient reports having had an echo at Dr. Irven Shelling office  Months out from his MI in July 2015 that showed improved LV function. Unfortunately, we do not have access to this report -Once stable he will need beta blocker titrated to max tolerated dose, ACEi, and statin -Needs repeat echo -If LV function remains <35% consider ICD placement (per primary cardiologist Dr. Einar Gip, MD)  4. Hypovolemic shock: -Secondary to #1 -Transfuse as needed   Signed, Christell Faith, PA-C Pager: [phone] 06/07/2015, 7:51 AM

## 2015-06-07 NOTE — Consult Note (Signed)
  GI Inpatient Follow-up Note  Patient Identification: Brian [name] is a 81 y.o. male  Subjective: No further bleeding since yesterday. Hgb stable. Just had solid lunch.  Scheduled Inpatient Medications:  . atorvastatin  80 mg Oral QPM  . feeding supplement (ENSURE ENLIVE)  237 mL Oral TID WC  . pantoprazole (PROTONIX) IV  40 mg Intravenous Q12H    Continuous Inpatient Infusions:   . sodium chloride 10 mL/hr (06/05/15 2241)    PRN Inpatient Medications:    Review of Systems: Constitutional: Weight is stable.  Eyes: No changes in vision. ENT: No oral lesions, sore throat.  GI: see HPI.  Heme/Lymph: No easy bruising.  CV: No chest pain.  GU: No hematuria.  Integumentary: No rashes.  Neuro: No headaches.  Psych: No depression/anxiety.  Endocrine: No heat/cold intolerance.  Allergic/Immunologic: No urticaria.  Resp: No cough, SOB.  Musculoskeletal: No joint swelling.    Physical Examination: BP 124/95 mmHg  Pulse 65  Temp(Src) 97.7 F (36.5 C) (Axillary)  Resp 19  Ht 5\' 6"  (1.676 m)  Wt 68.4 kg (150 lb 12.7 oz)  BMI 24.35 kg/m2  SpO2 96% Gen: NAD, alert and oriented x 4 HEENT: PEERLA, EOMI, Neck: supple, no JVD or thyromegaly Chest: CTA bilaterally, no wheezes, crackles, or other adventitious sounds CV: RRR, no m/g/c/r Abd: soft, NT, ND, +BS in all four quadrants; no HSM, guarding, ridigity, or rebound tenderness Ext: no edema, well perfused with 2+ pulses, Skin: no rash or lesions noted Lymph: no LAD  Data: Lab Results  Component Value Date   WBC 7.7 06/07/2015   HGB 12.4* 06/07/2015   HCT 35.8* 06/07/2015   MCV 92.7 06/07/2015   PLT 135* 06/07/2015    Recent Labs Lab 06/06/15 0503 06/06/15 0857 06/07/15 0508  HGB 12.0* 12.9* 12.4*   Lab Results  Component Value Date   NA 144 06/06/2015   K 3.4* 06/06/2015   CL 115* 06/06/2015   CO2 25 06/06/2015   BUN 10 06/06/2015   CREATININE 0.55* 06/06/2015   Lab Results  Component Value Date   ALT 13* 06/05/2015   AST 21 06/05/2015   ALKPHOS 66 06/05/2015   BILITOT 0.5 06/05/2015    Recent Labs Lab 06/05/15 1146  INR 1.03   Assessment/Plan: Mr. Elizondo is a 81 y.o. male  With massive lower GI bleeding, exacerbated by anticoagulation. No bleeding.  Recommendations: Pt may pass some old blood seen in colon yesterday. Ok for transfer to floor. If no further bleeding, then ok for discharge tomorrow. thanks Please call with questions or concerns.  Nickole Adamek, Lupita Dawn, MD

## 2015-06-07 NOTE — Progress Notes (Signed)
   06/07/15 1800  Clinical Encounter Type  Visited With Patient and family together  Visit Type Follow-up  Spiritual Encounters  Spiritual Needs Prayer  Stress Factors  Patient Stress Factors None identified  Family Stress Factors None identified  Chaplain followed up with patient and wife. Provided prayer and support.  Chaplain Bernhardt Riemenschneider [phone]

## 2015-06-07 NOTE — Op Note (Signed)
[facility] Patient Name: Brian [name] Procedure Date: 06/06/2015 10:24 AM MRN: [id number] Account #: [id number] Date of Birth: Sep 05, 1933 Admit Type: Inpatient Age: 81 Room: [facility] ENDO ROOM 4 Gender: Male Note Status: Finalized Procedure:         Colonoscopy Indications:       Rectal bleeding, Massive LGI bleeding post polypectomy and                     pt back on brilinta. Providers:         Lupita Dawn. Candace Cruise, MD Referring MD:      Forest Gleason Md, MD (Referring MD) Medicines:         Monitored Anesthesia Care Complications:     No immediate complications. Procedure:         Pre-Anesthesia Assessment:                    - Prior to the procedure, a History and Physical was                     performed, and patient medications, allergies and                     sensitivities were reviewed. The patient's tolerance of                     previous anesthesia was reviewed.                    - The risks and benefits of the procedure and the sedation                     options and risks were discussed with the patient. All                     questions were answered and informed consent was obtained.                    - After reviewing the risks and benefits, the patient was                     deemed in satisfactory condition to undergo the procedure.                    After obtaining informed consent, the colonoscope was                     passed under direct vision. Throughout the procedure, the                     patient's blood pressure, pulse, and oxygen saturations                     were monitored continuously. The Olympus PCF-H180AL                     colonoscope ( S#: [id number] ) was introduced through the                     anus and advanced to the the cecum, identified by                     appendiceal orifice and ileocecal valve. The colonoscopy  was performed without difficulty. The patient tolerated   the procedure well. The quality of the bowel preparation                     was fair. Findings:      Multiple small and large-mouthed diverticula were found in the sigmoid       colon, in the descending colon, in the transverse colon and in the       ascending colon.      Clotted blood was found in the entire colon. No active bleeding.      A small polyp was found in the descending colon. The polyp was sessile.       Not removed due to recent anticoagulant use. Large ulcerated area with 4       hemoclips intact. No active bleeding. However, 2 more hemoclips placed       over exposed ulcerated area.      The exam was otherwise without abnormality. Impression:        - Diverticulosis in the sigmoid colon, in the descending                     colon, in the transverse colon and in the ascending colon.                    - Blood in the entire examined colon.                    - One small polyp in the descending colon.                    - Ulcerated area from prior polypectomy. 2 more hemoclips                     placed.                    - The examination was otherwise normal.                    - No specimens collected. Recommendation:    - Clear liquid diet today.                    - The findings and recommendations were discussed with the                     patient's family.                    - Moniter for more bleeding. Procedure Code(s): --- Professional ---                    [phone], Colonoscopy, flexible; diagnostic, including                     collection of specimen(s) by brushing or washing, when                     performed (separate procedure) Diagnosis Code(s): --- Professional ---                    K92.2, Gastrointestinal hemorrhage, unspecified                    D12.4, Benign neoplasm of descending colon  K62.5, Hemorrhage of anus and rectum                    K57.30, Diverticulosis of large intestine without                     perforation or  abscess without bleeding CPT copyright 2014 [employer]. All rights reserved. The codes documented in this report are preliminary and upon coder review may  be revised to meet current compliance requirements. Hulen Luster, MD 06/06/2015 11:17:18 AM This report has been signed electronically. Number of Addenda: 0 Note Initiated On: 06/06/2015 10:24 AM Scope Withdrawal Time: 0 hours 11 minutes 39 seconds  Total Procedure Duration: 0 hours 15 minutes 28 seconds       [facility]

## 2015-06-07 NOTE — Care Management Important Message (Signed)
Important Message  Patient Details  Name: Brian [name] MRN: [id number] Date of Birth: [birth year]   Medicare Important Message Given:  Yes-second notification given    Juliann Pulse A Allmond 06/07/2015, 12:03 PM

## 2015-06-07 NOTE — Progress Notes (Addendum)
Removed a 22g IV from right wrist that was not documented. IV was hard to flush, leaked at insertion site, and infiltrated when flushed. Catheter was intact upon removal. Skin was clean, dry and intact. Small brusing noted around insertion site. Kathi Simpers, RN

## 2015-06-07 NOTE — Progress Notes (Signed)
Patient transferred from CCU. No complaints of pain. No signs of bleeding noted. Wife at bedside. Madlyn Frankel, RN

## 2015-06-07 NOTE — Progress Notes (Signed)
[place] [facility] [place] NAME: Kala Gassmann    MR#:  [id number]  DATE OF BIRTH:  July 14, 1933  SUBJECTIVE:  Patient reports no bloody bowel movements this morning. REVIEW OF SYSTEMS:    Review of Systems  Constitutional: Negative for fever, chills and malaise/fatigue.  HENT: Negative for sore throat.   Eyes: Negative for blurred vision.  Respiratory: Negative for cough, hemoptysis, shortness of breath and wheezing.   Cardiovascular: Negative for chest pain, palpitations and leg swelling.  Gastrointestinal: Negative for nausea, vomiting, abdominal pain, diarrhea and blood in stool.  Genitourinary: Negative for dysuria.  Musculoskeletal: Negative for back pain.  Neurological: Negative for dizziness, tremors and headaches.  Endo/Heme/Allergies: Does not bruise/bleed easily.    Tolerating Diet: Yes clear liquid     DRUG ALLERGIES:  No Known Allergies  VITALS:  Blood pressure 124/95, pulse 65, temperature 97.7 F (36.5 C), temperature source Axillary, resp. rate 19, height 5\' 6"  (1.676 m), weight 68.4 kg (150 lb 12.7 oz), SpO2 96 %.  PHYSICAL EXAMINATION:   Physical Exam  Constitutional: He is oriented to person, place, and time and well-developed, well-nourished, and in no distress. No distress.  HENT:  Head: Normocephalic.  Eyes: No scleral icterus.  Neck: Normal range of motion. Neck supple. No JVD present. No tracheal deviation present.  Cardiovascular: Normal rate, regular rhythm and normal heart sounds.  Exam reveals no gallop and no friction rub.   No murmur heard. Pulmonary/Chest: Effort normal and breath sounds normal. No respiratory distress. He has no wheezes. He has no rales. He exhibits no tenderness.  Abdominal: Soft. Bowel sounds are normal. He exhibits no distension and no mass. There is no tenderness. There is no rebound and no guarding.  Musculoskeletal: Normal range of motion. He exhibits no edema.   Neurological: He is alert and oriented to person, place, and time.  Skin: Skin is warm. No rash noted. No erythema.  Psychiatric: Affect and judgment normal.      LABORATORY PANEL:   CBC  Recent Labs Lab 06/07/15 0508  WBC 7.7  HGB 12.4*  HCT 35.8*  PLT 135*   ------------------------------------------------------------------------------------------------------------------  Chemistries   Recent Labs Lab 06/05/15 1146 06/06/15 0503  NA 140 144  K 3.7 3.4*  CL 110 115*  CO2 22 25  GLUCOSE 203* 91  BUN 12 10  CREATININE 0.84 0.55*  CALCIUM 8.6* 7.6*  AST 21  --   ALT 13*  --   ALKPHOS 66  --   BILITOT 0.5  --    ------------------------------------------------------------------------------------------------------------------  Cardiac Enzymes No results for input(s): TROPONINI in the last 168 hours. ------------------------------------------------------------------------------------------------------------------  RADIOLOGY:  Nm Gi Blood Loss  06/05/2015     IMPRESSION: No source of active gastrointestinal bleeding is identified.  Results were telephoned to Dr. Ella Jubilee at the time of interpretation on 06/05/2015 at 2030 hr.   Electronically Signed   By: Evangeline Dakin M.D.   On: 06/05/2015 20:35     ASSESSMENT AND PLAN:   81 y.o. male presented with LGI bleeding, exacerbated by anticoagulant.   1. GIB: Patient underwent EGD on 06/01/2013 and 16. There was blood everywhere but no active bleeding as per GI note. 2 hemoclips were placed at exposed ulcerated area in the cecal area from previous polypectomy. Hemoglobin remained stable. Continue to follow vitals and hemoglobin. I appreciate GI consultation.  2. Acute blood loss anemia : Patient had 6 units of blood along with FFP.Marland Kitchen His Hgb  is stable at 12.  3. Hypovolemic shock from LGIB: Hypovolemic shock has improved with blood transfusion. Vitals are stable.   4. HLD: on statin  5. CAD: I appreciate  cardiology consultation.BRILLANTA has been stopped due to problem #1. Aspirin will be restarted once okay with GI. Patient will need a follow-up with his primary care cardiologist as an outpatient.   Patient may be transferred to medical surgical floor. I will try soft diet.  Management plans discussed with the nurse and family And they are in agreement. CODE STATUS: DNR  TOTAL TIME TAKING CARE OF THIS PATIENT: 35 minutes.     Greater than 50% counseling and coordination of care  POSSIBLE D/C 2-3 days , DEPENDING ON CLINICAL CONDITION.   Elizabella Nolet M.D on 06/07/2015 at 11:37 AM  Between 7am to 6pm - Pager - [phone] After 6pm go to [email or web address] - password [email or web address] [facility]  Office  [phone]  CC: Primary care physician; Morton Peters, MD

## 2015-06-08 LAB — TYPE AND SCREEN
ABO/RH(D): A POS
Antibody Screen: NEGATIVE
UNIT DIVISION: 0
UNIT DIVISION: 0
UNIT DIVISION: 0
UNIT DIVISION: 0
UNIT DIVISION: 0
UNIT DIVISION: 0
UNIT DIVISION: 0
Unit division: 0
Unit division: 0
Unit division: 0
Unit division: 0
Unit division: 0

## 2015-06-08 NOTE — Progress Notes (Addendum)
Patient alert and oriented, no s/s of distress, patient has slept thru out the night, no complaints. Nurse removed saline lock from left wrist, patient complained that it was causing pain.

## 2015-06-08 NOTE — Discharge Summary (Signed)
Brian [name] at [facility] NAME: Brian [name]    MR#:  [id number]  DATE OF BIRTH:  1932/12/29  DATE OF ADMISSION:  06/05/2015 ADMITTING PHYSICIAN: Vaughan Basta, MD  DATE OF DISCHARGE: 7.26.2016  PRIMARY CARE PHYSICIAN: Morton Peters, MD    ADMISSION DIAGNOSIS:  Hypovolemic shock [R57.1] Gastrointestinal hemorrhage with melena [K92.1]  DISCHARGE DIAGNOSIS:  Principal Problem:   GI bleed Active Problems:   Hypovolemic shock   Malnutrition of moderate degree   SECONDARY DIAGNOSIS:   Past Medical History  Diagnosis Date  . Alcoholism   . Arthritis   . Depression with anxiety   . Hyperlipemia   . Hypertension   . Kidney stones   . AAA (abdominal aortic aneurysm)     2.9 CM infrarenal AAA on CT 11/2011  . Gastritis 03/2012  . 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   . Tubular adenoma of colon 07/2006    at sigmoid.   . Diverticulosis of colon 10/2009  . Positive H. pylori titer ?, before 2010    treated with Abx.   . Coronary artery disease     2 stents in july 2015    HOSPITAL COURSE:  81 y.o. male presented with LGI bleeding, exacerbated by anticoagulant.   1. GIB: Patient underwent EGD on 06/06/2015. There was blood but no active bleeding as per GI note. 2 hemoclips were placed at exposed ulcerated area in the cecal area from previous polypectomy. Hemoglobin has remained stable. Patient should stop Brillanta and continue ASA in one week after visit to Cardiologist.  2. Acute blood loss anemia : Patient had 6 units of blood along with FFP. His Hgb is stable at 12.  3. Hypovolemic shock from LGIB: Hypovolemic shock has improved with blood transfusion. Vitals are stable.   4. HLD: on statin  5. CAD: I appreciate cardiology consultation.BRILLANTA has been stopped due to problem #1. Aspirin will be restarted once okay with GI and follow up with his Cardiologist. Patient will need a follow-up with his  primary care cardiologist as an outpatient   DISCHARGE CONDITIONS AND DIET:  Stable condition. Heart Healthy Diet  CONSULTS OBTAINED:  Treatment Team:  Katha Cabal, MD Hulen Luster, MD Evans Lance, MD  DRUG ALLERGIES:  No Known Allergies  DISCHARGE MEDICATIONS:   Current Discharge Medication List    CONTINUE these medications which have NOT CHANGED   Details  atorvastatin (LIPITOR) 80 MG tablet Take 1 tablet (80 mg total) by mouth daily at 6 PM. Qty: 30 tablet, Refills: 1    diazepam (VALIUM) 5 MG tablet Take 5 mg by mouth every 8 (eight) hours as needed for anxiety.     lisinopril (PRINIVIL,ZESTRIL) 5 MG tablet Take 5 mg by mouth daily.    metoprolol succinate (TOPROL-XL) 25 MG 24 hr tablet Take 25 mg by mouth daily.    metoprolol tartrate (LOPRESSOR) 25 MG tablet Take 1 tablet (25 mg total) by mouth 2 (two) times daily. Qty: 30 tablet, Refills: 1      STOP taking these medications     aspirin 81 MG chewable tablet      ticagrelor (BRILINTA) 90 MG TABS tablet      traMADol (ULTRAM) 50 MG tablet               Today   CHIEF COMPLAINT:  Doing well this am. NO bleeding. No abdominal pain. No SOB or chest pain  VITAL SIGNS:  Blood pressure 120/74, pulse 56, temperature 98 F (36.7 C), temperature source Oral, resp. rate 18, height 5\' 6"  (1.676 m), weight 68.4 kg (150 lb 12.7 oz), SpO2 95 %.   REVIEW OF SYSTEMS:  Review of Systems  Constitutional: Negative for fever, chills and malaise/fatigue.  HENT: Negative for sore throat.   Eyes: Negative for blurred vision.  Respiratory: Negative for cough, hemoptysis, shortness of breath and wheezing.   Cardiovascular: Negative for chest pain, palpitations and leg swelling.  Gastrointestinal: Negative for nausea, vomiting, abdominal pain, diarrhea and blood in stool.  Genitourinary: Negative for dysuria.  Musculoskeletal: Negative for back pain.  Neurological: Negative for dizziness, tremors and  headaches.  Endo/Heme/Allergies: Does not bruise/bleed easily.     PHYSICAL EXAMINATION:  GENERAL:  81 y.o.-year-old patient lying in the bed with no acute distress.  NECK:  Supple, no jugular venous distention. No thyroid enlargement, no tenderness.  LUNGS: Normal breath sounds bilaterally, no wheezing, rales,rhonchi  No use of accessory muscles of respiration.  CARDIOVASCULAR: S1, S2 normal. No murmurs, rubs, or gallops.  ABDOMEN: Soft, non-tender, non-distended. Bowel sounds present. No organomegaly or mass.  EXTREMITIES: No pedal edema, cyanosis, or clubbing.  PSYCHIATRIC: The patient is alert and oriented x 3.  SKIN: No obvious rash, lesion, or ulcer.   DATA REVIEW:   CBC  Recent Labs Lab 06/07/15 0508  WBC 7.7  HGB 12.4*  HCT 35.8*  PLT 135*    Chemistries   Recent Labs Lab 06/05/15 1146 06/06/15 0503  NA 140 144  K 3.7 3.4*  CL 110 115*  CO2 22 25  GLUCOSE 203* 91  BUN 12 10  CREATININE 0.84 0.55*  CALCIUM 8.6* 7.6*  AST 21  --   ALT 13*  --   ALKPHOS 66  --   BILITOT 0.5  --     Cardiac Enzymes No results for input(s): TROPONINI in the last 168 hours.  Microbiology Results  @[email or web address] @  RADIOLOGY:  No results found.    Management plans discussed with the patient and he is in agreement. Stable for discharge home  Patient should follow up with   Dr. Skip Estimable in one week and PCP in one week.  CODE STATUS:     Code Status Orders        Start     Ordered   06/05/15 2108  Full code   Continuous     06/05/15 2107      TOTAL TIME TAKING CARE OF THIS PATIENT: 35 minutes.    [name] M.D on 06/08/2015 at 10:24 AM  Between 7am to 6pm - Pager - [phone] After 6pm go to [email or web address] - password [email or web address] [facility]  Office  [phone]  CC: Primary care physician; Morton Peters, MD

## 2015-06-08 NOTE — Discharge Instructions (Signed)
Gastrointestinal Bleeding °Gastrointestinal bleeding is bleeding somewhere along the path that food travels through the body (digestive tract). This path is anywhere between the mouth and the opening of the butt (anus). You may have blood in your throw up (vomit) or in your poop (stools). If there is a lot of bleeding, you may need to stay in the hospital. °HOME CARE °· Only take medicine as told by your doctor. °· Eat foods with fiber such as whole grains, fruits, and vegetables. You can also try eating 1 to 3 prunes a day. °· Drink enough fluids to keep your pee (urine) clear or pale yellow. °GET HELP RIGHT AWAY IF:  °· Your bleeding gets worse. °· You feel dizzy, weak, or you pass out (faint). °· You have bad cramps in your back or belly (abdomen). °· You have large blood clumps (clots) in your poop. °· Your problems are getting worse. °MAKE SURE YOU:  °· Understand these instructions. °· Will watch your condition. °· Will get help right away if you are not doing well or get worse. °Document Released: 08/08/2008 Document Revised: 10/16/2012 Document Reviewed: 10/09/2011 °ExitCare® Patient Information ©2015 [employer]. This information is not intended to replace advice given to you by your health care provider. Make sure you discuss any questions you have with your health care provider. ° ° °

## 2015-06-08 NOTE — Plan of Care (Signed)
Problem: Consults Goal: GI Bleeding Patient Education See Patient Education Module for education specifics.  Outcome: Progressing Plan of care progress to goal: patient without s/s of bleeding, hgb. 12.4.  Patient without complaints of pain or discomfort.  Patient has good appetite. Patient hopes to go home today.

## 2015-06-14 DIAGNOSIS — E785 Hyperlipidemia, unspecified: Secondary | ICD-10-CM | POA: Diagnosis not present

## 2015-06-14 DIAGNOSIS — I2510 Atherosclerotic heart disease of native coronary artery without angina pectoris: Secondary | ICD-10-CM | POA: Diagnosis not present

## 2015-06-14 DIAGNOSIS — I10 Essential (primary) hypertension: Secondary | ICD-10-CM | POA: Diagnosis not present

## 2015-06-21 DIAGNOSIS — K208 Other esophagitis: Secondary | ICD-10-CM | POA: Diagnosis not present

## 2015-06-23 ENCOUNTER — Encounter: Payer: Self-pay | Admitting: *Deleted

## 2015-07-22 ENCOUNTER — Encounter: Payer: Self-pay | Admitting: Internal Medicine

## 2015-07-22 ENCOUNTER — Other Ambulatory Visit (INDEPENDENT_AMBULATORY_CARE_PROVIDER_SITE_OTHER): Payer: Medicare Other

## 2015-07-22 ENCOUNTER — Ambulatory Visit (INDEPENDENT_AMBULATORY_CARE_PROVIDER_SITE_OTHER): Payer: Medicare Other | Admitting: Internal Medicine

## 2015-07-22 VITALS — BP 100/70 | HR 68 | Ht 66.0 in | Wt 146.8 lb

## 2015-07-22 DIAGNOSIS — Z9889 Other specified postprocedural states: Secondary | ICD-10-CM

## 2015-07-22 DIAGNOSIS — K922 Gastrointestinal hemorrhage, unspecified: Secondary | ICD-10-CM

## 2015-07-22 DIAGNOSIS — R1013 Epigastric pain: Secondary | ICD-10-CM | POA: Diagnosis not present

## 2015-07-22 DIAGNOSIS — Z860100 Personal history of colon polyps, unspecified: Secondary | ICD-10-CM

## 2015-07-22 LAB — CBC WITH DIFFERENTIAL/PLATELET
BASOS PCT: 0.5 % (ref 0.0–3.0)
Basophils Absolute: 0 10*3/uL (ref 0.0–0.1)
EOS PCT: 2.1 % (ref 0.0–5.0)
Eosinophils Absolute: 0.1 10*3/uL (ref 0.0–0.7)
HEMATOCRIT: 41.9 % (ref 39.0–52.0)
HEMOGLOBIN: 14 g/dL (ref 13.0–17.0)
LYMPHS PCT: 34.4 % (ref 12.0–46.0)
Lymphs Abs: 2.3 10*3/uL (ref 0.7–4.0)
MCHC: 33.5 g/dL (ref 30.0–36.0)
MCV: 95.5 fl (ref 78.0–100.0)
Monocytes Absolute: 0.6 10*3/uL (ref 0.1–1.0)
Monocytes Relative: 8.5 % (ref 3.0–12.0)
Neutro Abs: 3.6 10*3/uL (ref 1.4–7.7)
Neutrophils Relative %: 54.5 % (ref 43.0–77.0)
Platelets: 144 10*3/uL — ABNORMAL LOW (ref 150.0–400.0)
RBC: 4.39 Mil/uL (ref 4.22–5.81)
RDW: 14.8 % (ref 11.5–15.5)
WBC: 6.6 10*3/uL (ref 4.0–10.5)

## 2015-07-22 LAB — IBC PANEL
Iron: 81 ug/dL (ref 42–165)
Saturation Ratios: 19.4 % — ABNORMAL LOW (ref 20.0–50.0)
TRANSFERRIN: 299 mg/dL (ref 212.0–360.0)

## 2015-07-22 NOTE — Patient Instructions (Signed)
Your physician has requested that you go to the basement for the following lab work before leaving today: CBC, IBC  Please purchase the following medications over the counter and take as directed: IBGuard- 1 capsule every morning as needed

## 2015-07-22 NOTE — Progress Notes (Signed)
Subjective:    Patient ID: Brian [name], male    DOB: 11-Nov-1933, 81 y.o.   MRN: [id number]  HPI Brian [name] is an 81 year old male with a past medical history of adenomatous colon polyps status post recent colonoscopy with polypectomy complicated by post-polypectomy hemorrhage who seen for follow-up. He also has a history of alcohol use/abuse, H. 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 He is here today with his wife. He had a colonoscopy on 06/01/2015. This revealed a 2.5 cm cecal polyp which was removed by EMR. The polypectomy base was clipped 4 because he was on Brilinta. He also had 7 other polyps which were removed. He had severe diverticulosis throughout the colon. We held Liberal for 3 days. When he resumed it the next day he had large-volume hematochezia, hypertension and presyncope. He was seen in the emergency room and admitted to [facility]. He required blood transfusion 4 units along with FFP and platelets. Repeat colonoscopy was performed by Dr. Verdie Shire on 06/06/2015. This revealed a large ulcerated area with 4 hemoclips intact. There was no active bleeding at that time. 2 hemoclips were placed over exposed ulcerated areas. He was discharged thereafter without further bleeding  He reports that he has been feeling well. He is walking half a mile per day. Bowel habits have returned to normal without blood in his stool or melena. Energy levels have been good. He has not resumed Brilinta or aspirin. He admits to being scared to do so. Has cardiology follow-up coming up early next month. Reports good appetite. Is not using alcohol in any longer. Does occasionally have dyspeptic feeling to his stomach with waking. This resolves after about an hour. His been drinking aloe vera which she thinks helps. Denies dysphagia or odynophagia   Review of Systems As per history of present illness, otherwise negative  Current Medications,  Allergies, Past Medical History, Past Surgical History, Family History and Social History were reviewed in [employer] record.     Objective:   Physical Exam BP 100/70 mmHg  Pulse 68  Ht 5\' 6"  (1.676 m)  Wt 146 lb 12.8 oz (66.588 kg)  BMI 23.71 kg/m2 Constitutional: Well-developed and well-nourished. No distress. HEENT: Normocephalic and atraumatic. Oropharynx is clear and moist. No oropharyngeal exudate. Conjunctivae are normal.  No scleral icterus. Neck: Neck supple. Trachea midline. Cardiovascular: Normal rate, regular rhythm and intact distal pulses. No M/R/G Pulmonary/chest: Effort normal and breath sounds normal. No wheezing, rales or rhonchi. Abdominal: Soft, nontender, nondistended. Bowel sounds active throughout. There are no masses palpable. . Extremities: no clubbing, cyanosis, or edema Neurological: Alert and oriented to person place and time. Skin: Skin is warm and dry. No rashes noted. Psychiatric: Normal mood and affect. Behavior is normal.  [place] admission records reviewed    Assessment & Plan:   81 year old male with a past medical history of adenomatous colon polyps status post recent colonoscopy with polypectomy complicated by post-polypectomy hemorrhage who seen for follow-up.   1. Adenomatous colon polyps removed by EMR/post-polypectomy hemorrhage -- he had a very hemodynamically significant post-polypectomy hemorrhage in the setting of Brilinta. Fortunately he has recovered from this event. He is doing well and states he is glad he had the colonoscopy given the precancerous polyps that were removed. At this point I feel it is safe to resume anticoagulation as deemed necessary by his prescribing providers. I've advised he can restart baby aspirin daily at this time. He voices understanding.  Based on his age, he will likely not require further polyp surveillance colonoscopies. We discussed this today and he fully agrees with this  recommendation. --Repeat CBC and iron studies today  2. Dyspepsia -- history of gastritis. No great response to PPI in the past. He is not having heartburn. Has had good response with aloe which he can continue. I've also suggested he try OTC IBgard 2 capsules each morning. Call if this worsens or fails to improve. He is advised to continue to avoid alcohol as alcohol was felt responsible for prior gastritis  Follow-up on an as-needed basis 25 minutes spent with patient today

## 2015-08-18 DIAGNOSIS — I10 Essential (primary) hypertension: Secondary | ICD-10-CM | POA: Diagnosis not present

## 2015-08-18 DIAGNOSIS — E785 Hyperlipidemia, unspecified: Secondary | ICD-10-CM | POA: Diagnosis not present

## 2015-08-18 DIAGNOSIS — I2510 Atherosclerotic heart disease of native coronary artery without angina pectoris: Secondary | ICD-10-CM | POA: Diagnosis not present

## 2015-08-19 DIAGNOSIS — Z23 Encounter for immunization: Secondary | ICD-10-CM | POA: Diagnosis not present

## 2015-09-07 DIAGNOSIS — K208 Other esophagitis: Secondary | ICD-10-CM | POA: Diagnosis not present

## 2015-09-07 DIAGNOSIS — I10 Essential (primary) hypertension: Secondary | ICD-10-CM | POA: Diagnosis not present

## 2015-09-21 ENCOUNTER — Other Ambulatory Visit: Payer: Self-pay

## 2015-09-21 DIAGNOSIS — I714 Abdominal aortic aneurysm, without rupture, unspecified: Secondary | ICD-10-CM

## 2015-09-24 ENCOUNTER — Ambulatory Visit
Admission: RE | Admit: 2015-09-24 | Discharge: 2015-09-24 | Disposition: A | Discharge status: Home / Self Care | Payer: Medicare Other | Source: Ambulatory Visit

## 2015-09-24 DIAGNOSIS — N281 Cyst of kidney, acquired: Secondary | ICD-10-CM | POA: Diagnosis not present

## 2015-09-24 DIAGNOSIS — I714 Abdominal aortic aneurysm, without rupture, unspecified: Secondary | ICD-10-CM

## 2015-12-22 DIAGNOSIS — E785 Hyperlipidemia, unspecified: Secondary | ICD-10-CM | POA: Diagnosis not present

## 2015-12-22 DIAGNOSIS — I2510 Atherosclerotic heart disease of native coronary artery without angina pectoris: Secondary | ICD-10-CM | POA: Diagnosis not present

## 2015-12-22 DIAGNOSIS — I10 Essential (primary) hypertension: Secondary | ICD-10-CM | POA: Diagnosis not present

## 2015-12-27 DIAGNOSIS — E785 Hyperlipidemia, unspecified: Secondary | ICD-10-CM | POA: Diagnosis not present

## 2015-12-27 DIAGNOSIS — I2510 Atherosclerotic heart disease of native coronary artery without angina pectoris: Secondary | ICD-10-CM | POA: Diagnosis not present

## 2016-01-14 ENCOUNTER — Ambulatory Visit (INDEPENDENT_AMBULATORY_CARE_PROVIDER_SITE_OTHER): Payer: Medicare Other | Admitting: Family Medicine

## 2016-01-14 ENCOUNTER — Encounter: Payer: Self-pay | Admitting: Family Medicine

## 2016-01-14 VITALS — BP 122/77 | HR 69 | Temp 97.0°F | Ht 66.5 in | Wt 151.0 lb

## 2016-01-14 DIAGNOSIS — E785 Hyperlipidemia, unspecified: Secondary | ICD-10-CM

## 2016-01-14 DIAGNOSIS — K625 Hemorrhage of anus and rectum: Secondary | ICD-10-CM | POA: Diagnosis not present

## 2016-01-14 DIAGNOSIS — Z8719 Personal history of other diseases of the digestive system: Secondary | ICD-10-CM

## 2016-01-14 DIAGNOSIS — F418 Other specified anxiety disorders: Secondary | ICD-10-CM | POA: Diagnosis not present

## 2016-01-14 DIAGNOSIS — I714 Abdominal aortic aneurysm, without rupture, unspecified: Secondary | ICD-10-CM | POA: Insufficient documentation

## 2016-01-14 DIAGNOSIS — M1990 Unspecified osteoarthritis, unspecified site: Secondary | ICD-10-CM

## 2016-01-14 DIAGNOSIS — I10 Essential (primary) hypertension: Secondary | ICD-10-CM

## 2016-01-14 DIAGNOSIS — F329 Major depressive disorder, single episode, unspecified: Secondary | ICD-10-CM

## 2016-01-14 DIAGNOSIS — F419 Anxiety disorder, unspecified: Secondary | ICD-10-CM

## 2016-01-14 DIAGNOSIS — F32A Depression, unspecified: Secondary | ICD-10-CM

## 2016-01-14 MED ORDER — SERTRALINE HCL 50 MG PO TABS: 50.0000 mg | ORAL_TABLET | Freq: Every day | ORAL | Status: DC

## 2016-01-14 MED ORDER — DIAZEPAM 2 MG PO TABS: 4.0000 mg | ORAL_TABLET | Freq: Three times a day (TID) | ORAL | Status: DC | PRN

## 2016-01-14 NOTE — Assessment & Plan Note (Signed)
Not under great control. On TID valium. No preventative medicine. Discussed the dangers of using valium in someone over the age of 9. As he has been on it for 55 years, we will decrease very slowly. Decreasing from 15mg  daily to 12mg  daily this month. Will get him started on zoloft for preventative. He only has 2 pills of his valium left. Significant concern about withdrawal. Will prescribe today. PMP reviewed and appropriate. Controlled substance agreement to be signed next visit.

## 2016-01-14 NOTE — Assessment & Plan Note (Signed)
On PRN tramadol. 60 pills lasting him 1-2 months. Will get him refill on 2nd visit.

## 2016-01-14 NOTE — Assessment & Plan Note (Signed)
Under good control. Continue current regimen. Continue to monitor. Continue to follow with cardiology 

## 2016-01-14 NOTE — Progress Notes (Signed)
BP 122/77 mmHg  Pulse 69  Temp(Src) 97 F (36.1 C)  Ht 5' 6.5" (1.689 m)  Wt 151 lb (68.493 kg)  BMI 24.01 kg/m2  SpO2 96%   Subjective:    Patient ID: Brian [name], male    DOB: [birth year], 82 y.o.   MRN: [id number]  HPI: Brian [name] is a 82 y.o. male who presents today to establish care as his former PCP is no longer in practice.   Chief Complaint  Patient presents with  . Establish Care  . URI  . Hypertension  . Hyperlipidemia  . Anxiety   AAA- has been monitoring it annually, due in November  UPPER RESPIRATORY TRACT INFECTION Duration: 2 weeks Worst symptom: feeling tired and run down Fever: no Cough: yes Shortness of breath: yes Wheezing: yes Chest pain: no Chest tightness: no Chest congestion: no Nasal congestion: yes Runny nose: yes Post nasal drip: yes Sneezing: yes Sore throat: yes Swollen glands: no Sinus pressure: no Headache: no Face pain: no Toothache: no Ear pain: no  Ear pressure: no  Eyes red/itching:no Eye drainage/crusting: no  Vomiting: no Rash: no Fatigue: yes Sick contacts: no Strep contacts: no  Context: stable Recurrent sinusitis: no Relief with OTC cold/cough medications: no  Treatments attempted: anti-histamine   HYPERTENSION / HYPERLIPIDEMIA Satisfied with current treatment? yes Duration of hypertension: chronic BP monitoring frequency: not checking BP medication side effects: no Duration of hyperlipidemia: chronic Cholesterol medication side effects: no Cholesterol supplements: none Medication compliance: excellent compliance Aspirin: no Recent stressors: no Recurrent headaches: no Visual changes: no Palpitations: no Dyspnea: no Chest pain: no Lower extremity edema: no Dizzy/lightheaded: no  ANXIETY/STRESS- has been taking the valium for 55 years. Has been up tight in the past. Was on xanax in the past, but then got taken off of that.  Duration:stable Anxious mood: yes  Excessive worrying:  yes Irritability: no  Sweating: no Nausea: yes Palpitations:no Hyperventilation: no Panic attacks: yes Agoraphobia: no  Obscessions/compulsions: yes Depressed mood: no Depression screen [facility] 2/9 01/14/2016 01/14/2016  Decreased Interest 1 1  Down, Depressed, Hopeless 1 0  PHQ - 2 Score 2 1  Altered sleeping 0 -  Tired, decreased energy 2 -  Change in appetite 1 -  Feeling bad or failure about yourself  0 -  Trouble concentrating 0 -  Moving slowly or fidgety/restless 1 -  PHQ-9 Score 6 -  Difficult doing work/chores Not difficult at all -  Anhedonia: no Weight changes: no Insomnia: no   Hypersomnia: no Fatigue/loss of energy: yes Feelings of worthlessness: no Feelings of guilt: no Impaired concentration/indecisiveness: no Suicidal ideations: no  Crying spells: no Recent Stressors/Life Changes: no  Has been taking tramadol for arthritis in his legs, has been on it for about a year, taking it occasionally, few times a week. Using it appropriately.   Active Ambulatory Problems    Diagnosis Date Noted  . Dyspepsia 02/21/2012  . Hx of adenomatous colonic polyps 02/21/2012  . Anxiety and depression 02/21/2012  . HTN (hypertension) 02/21/2012  . Hyperlipidemia 02/21/2012  . Renal stones 02/21/2012  . Acute anterior wall MI ([place]) 05/29/2014  . History of GI diverticular bleed 05/04/2015  . History of colonic polyps 05/14/2015  . Encounter for long-term (current) use of antiplatelets/antithrombotics 05/14/2015  . Malnutrition of moderate degree ([place]) 06/07/2015  . Arthritis 01/14/2016  . AAA (abdominal aortic aneurysm) ([place]) 01/14/2016   Resolved Ambulatory Problems    Diagnosis Date Noted  . H. pylori infection 02/21/2012  .  GIB (gastrointestinal bleeding) 05/03/2015  . Rectal bleeding 05/14/2015  . GI bleed 06/05/2015  . Hypovolemic shock ([place]) 06/05/2015   Past Medical History  Diagnosis Date  . Alcoholism ([place])   . Depression with anxiety   . Hyperlipemia   .  Hypertension   . Kidney stones   . Gastritis 03/2012  . 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 Tubular adenoma of colon   . Diverticulosis of colon 10/2009  . Positive H. pylori titer ?, before 2010  . Coronary artery disease   . Myocardial infarction [facility]) 2015   Past Surgical History  Procedure Laterality Date  . None    . Left heart catheterization with coronary angiogram Bilateral 05/29/2014    Procedure: LEFT HEART CATHETERIZATION WITH CORONARY ANGIOGRAM;  Surgeon: Laverda Page, MD;  Location: [facility] CATH LAB;  Service: Cardiovascular;  Laterality: Bilateral;  . Percutaneous coronary stent intervention (pci-s)  05/29/2014    Procedure: PERCUTANEOUS CORONARY STENT INTERVENTION (PCI-S);  Surgeon: Laverda Page, MD;  Location: [facility] CATH LAB;  Service: Cardiovascular;;  DES x2 Prox and Mid LAD   . Colonoscopy with propofol Left 06/06/2015    Procedure: COLONOSCOPY WITH PROPOFOL;  Surgeon: Hulen Luster, MD;  Location[facility] ENDOSCOPY;  Service: Endoscopy;  Laterality: Left;   No Known Allergies  Family History  Problem Relation Age of Onset  . Ulcers Father     stomach   . Heart disease Father   . Colon cancer Neg Hx   . Heart disease Mother   . Anuerysm Brother   . Hypertension Daughter   . Heart disease Paternal Grandmother   . Heart attack Paternal Grandmother   . Heart disease Paternal Grandfather    Social History   Social History  . Marital Status: Married    Spouse Name: N/A  . Number of Children: N/A  . Years of Education: N/A   Social History Main Topics  . Smoking status: Current Some Day Smoker -- 0.50 packs/day for 60 years    Types: Cigarettes  . Smokeless tobacco: Never Used  . Alcohol Use: No  . Drug Use: No  . Sexual Activity: Not Asked   Other Topics Concern  . None   Social History Narrative    Review of Systems  Constitutional: Negative.   HENT: Negative.   Respiratory: Negative.   Cardiovascular: Negative.   Psychiatric/Behavioral: Negative.      Per HPI unless specifically indicated above     Objective:    BP 122/77 mmHg  Pulse 69  Temp(Src) 97 F (36.1 C)  Ht 5' 6.5" (1.689 m)  Wt 151 lb (68.493 kg)  BMI 24.01 kg/m2  SpO2 96%  Wt Readings from Last 3 Encounters:  01/14/16 151 lb (68.493 kg)  07/22/15 146 lb 12.8 oz (66.588 kg)  06/05/15 150 lb 12.7 oz (68.4 kg)    Physical Exam  Constitutional: He is oriented to person, place, and time. He appears well-developed and well-nourished. No distress.  HENT:  Head: Normocephalic and atraumatic.  Right Ear: Hearing and external ear normal.  Left Ear: Hearing and external ear normal.  Nose: Nose normal.  Mouth/Throat: Oropharynx is clear and moist. No oropharyngeal exudate.  Eyes: Conjunctivae, EOM and lids are normal. Pupils are equal, round, and reactive to light. Right eye exhibits no discharge. Left eye exhibits no discharge. No scleral icterus.  Neck: Normal range of motion. Neck supple. No JVD present. No tracheal deviation present. No thyromegaly present.  Cardiovascular: Normal rate, regular rhythm, normal heart  sounds and intact distal pulses.  Exam reveals no gallop and no friction rub.   No murmur heard. Pulmonary/Chest: Effort normal and breath sounds normal. No stridor. No respiratory distress. He has no wheezes. He has no rales. He exhibits no tenderness.  Abdominal: Soft. Bowel sounds are normal. He exhibits no distension and no mass. There is no tenderness. There is no rebound and no guarding.  Musculoskeletal: Normal range of motion.  Lymphadenopathy:    He has no cervical adenopathy.  Neurological: He is alert and oriented to person, place, and time.  Skin: Skin is warm, dry and intact. No rash noted. He is not diaphoretic. No erythema. No pallor.  Psychiatric: He has a normal mood and affect. His speech is normal and behavior is normal. Judgment and thought content normal. Cognition and memory are normal.  Nursing note and vitals reviewed.   Results  for orders placed or performed in visit on 07/22/15  CBC with Differential/Platelet  Result Value Ref Range   WBC 6.6 4.0 - 10.5 K/uL   RBC 4.39 4.22 - 5.81 Mil/uL   Hemoglobin 14.0 13.0 - 17.0 g/dL   HCT 41.9 39.0 - 52.0 %   MCV 95.5 78.0 - 100.0 fl   MCHC 33.5 30.0 - 36.0 g/dL   RDW 14.8 11.5 - 15.5 %   Platelets 144.0 (L) 150.0 - 400.0 K/uL   Neutrophils Relative % 54.5 43.0 - 77.0 %   Lymphocytes Relative 34.4 12.0 - 46.0 %   Monocytes Relative 8.5 3.0 - 12.0 %   Eosinophils Relative 2.1 0.0 - 5.0 %   Basophils Relative 0.5 0.0 - 3.0 %   Neutro Abs 3.6 1.4 - 7.7 K/uL   Lymphs Abs 2.3 0.7 - 4.0 K/uL   Monocytes Absolute 0.6 0.1 - 1.0 K/uL   Eosinophils Absolute 0.1 0.0 - 0.7 K/uL   Basophils Absolute 0.0 0.0 - 0.1 K/uL  IBC panel  Result Value Ref Range   Iron 81 42 - 165 ug/dL   Transferrin 299.0 212.0 - 360.0 mg/dL   Saturation Ratios 19.4 (L) 20.0 - 50.0 %      Assessment & Plan:   Problem List Items Addressed This Visit      Cardiovascular and Mediastinum   HTN (hypertension) - Primary    Under good control. Continue current regimen. Continue to monitor. Continue to follow with cardiology.      Relevant Medications   aspirin 81 MG tablet   Other Relevant Orders   Comprehensive metabolic panel   Microalbumin, Urine Waived   AAA (abdominal aortic aneurysm) (HCC)   Relevant Medications   aspirin 81 MG tablet     Digestive   RESOLVED: Rectal bleeding     Musculoskeletal and Integument   Arthritis    On PRN tramadol. 60 pills lasting him 1-2 months. Will get him refill on 2nd visit.       Relevant Medications   aspirin 81 MG tablet     Other   Anxiety and depression    Not under great control. On TID valium. No preventative medicine. Discussed the dangers of using valium in someone over the age of 65. As he has been on it for 55 years, we will decrease very slowly. Decreasing from 15mg  daily to 12mg  daily this month. Will get him started on zoloft for  preventative. He only has 2 pills of his valium left. Significant concern about withdrawal. Will prescribe today. PMP reviewed and appropriate. Controlled substance agreement to be signed next  visit.       Relevant Orders   Comprehensive metabolic panel   TSH   Hyperlipidemia    Under good control. Continue current regimen. Continue to monitor. Continue to follow with cardiology.      Relevant Medications   aspirin 81 MG tablet   Other Relevant Orders   Comprehensive metabolic panel   Lipid Panel w/o Chol/HDL Ratio   History of GI diverticular bleed    Will check CBC next visit. No problems at this time. Continue to monitor.       Relevant Orders   Comprehensive metabolic panel   CBC with Differential/Platelet       Follow up plan: Return 1-2 weeks for arthritis, 4-6 weeks for mood.

## 2016-01-14 NOTE — Assessment & Plan Note (Signed)
Will check CBC next visit. No problems at this time. Continue to monitor.

## 2016-01-20 ENCOUNTER — Ambulatory Visit (INDEPENDENT_AMBULATORY_CARE_PROVIDER_SITE_OTHER): Payer: Medicare Other | Admitting: Family Medicine

## 2016-01-20 ENCOUNTER — Encounter: Payer: Self-pay | Admitting: Family Medicine

## 2016-01-20 VITALS — BP 138/88 | HR 92 | Temp 97.8°F | Wt 153.0 lb

## 2016-01-20 DIAGNOSIS — M1990 Unspecified osteoarthritis, unspecified site: Secondary | ICD-10-CM

## 2016-01-20 DIAGNOSIS — F418 Other specified anxiety disorders: Secondary | ICD-10-CM

## 2016-01-20 DIAGNOSIS — F32A Depression, unspecified: Secondary | ICD-10-CM

## 2016-01-20 DIAGNOSIS — F329 Major depressive disorder, single episode, unspecified: Secondary | ICD-10-CM

## 2016-01-20 DIAGNOSIS — Z79899 Other long term (current) drug therapy: Secondary | ICD-10-CM | POA: Insufficient documentation

## 2016-01-20 DIAGNOSIS — F419 Anxiety disorder, unspecified: Secondary | ICD-10-CM

## 2016-01-20 MED ORDER — TRAMADOL HCL 50 MG PO TABS: 50.0000 mg | ORAL_TABLET | ORAL | Status: DC | PRN

## 2016-01-20 NOTE — Assessment & Plan Note (Addendum)
Does not want to take the zoloft, states that it made his stomach upset. OK to stop it and is aware that we are planning on decreasing his valium slowly. Continue to monitor and follow up in 1 month as discussed at previous appointment.

## 2016-01-20 NOTE — Assessment & Plan Note (Signed)
For valium and tramadol. See scanned document.

## 2016-01-20 NOTE — Progress Notes (Signed)
BP 138/88 mmHg  Pulse 92  Temp(Src) 97.8 F (36.6 C)  Wt 153 lb (69.4 kg)  SpO2 96%   Subjective:    Patient ID: Brian [name], male    DOB: 1933-09-16, 82 y.o.   MRN: [id number]  HPI: Brian [name] is a 82 y.o. male  Chief Complaint  Patient presents with  . Arthritis  . controlled substance agreement   OSTEOARTHRITIS Pain control status: stable Satisfied with current treatment?: yes Severity: moderate to severe Quality: aching  Decreased function/range of motion: yes Erythema: No Swelling: No Heat or warmth: No Morning stiffness: Yes Aggravating factors: rest Alleviating factors: tramadol Relief with NSAIDs?: None taken Treatments attempted: tramadol Involved Joints:    Hands:  no    Wrists:  no    Elbows:  no    Shoulders:  no    Back: yes       Hips: yes, both    Knees: yes, both    Ankles: no    Feet: no  Is not taking his zoloft, felt nauseous on it. Stopped it. Doesn't want to take any medication. Refuses any anti-depressants at this time. OK to wean off the valium.   Relevant past medical, surgical, family and social history reviewed and updated as indicated. Interim medical history since our last visit reviewed. Allergies and medications reviewed and updated.  Review of Systems  Constitutional: Negative.  Negative for fever, chills, diaphoresis, activity change, appetite change, fatigue and unexpected weight change.  HENT: Negative.  Negative for congestion, dental problem, drooling, ear discharge, ear pain, facial swelling, hearing loss, mouth sores, nosebleeds, postnasal drip, rhinorrhea, sinus pressure, sneezing, sore throat, tinnitus, trouble swallowing and voice change.   Respiratory: Negative.  Negative for apnea, cough, choking, chest tightness, shortness of breath, wheezing and stridor.   Cardiovascular: Negative.   Musculoskeletal: Positive for myalgias, back pain, arthralgias and gait problem. Negative for joint swelling, neck pain and neck  stiffness.  Skin: Negative.   Psychiatric/Behavioral: Negative.     Per HPI unless specifically indicated above     Objective:    BP 138/88 mmHg  Pulse 92  Temp(Src) 97.8 F (36.6 C)  Wt 153 lb (69.4 kg)  SpO2 96%  Wt Readings from Last 3 Encounters:  01/20/16 153 lb (69.4 kg)  01/14/16 151 lb (68.493 kg)  07/22/15 146 lb 12.8 oz (66.588 kg)    Physical Exam  Constitutional: He is oriented to person, place, and time. He appears well-developed and well-nourished. No distress.  HENT:  Head: Normocephalic and atraumatic.  Right Ear: Hearing and external ear normal.  Left Ear: Hearing and external ear normal.  Nose: Nose normal.  Mouth/Throat: Oropharynx is clear and moist. No oropharyngeal exudate.  Eyes: Conjunctivae, EOM and lids are normal. Pupils are equal, round, and reactive to light. Right eye exhibits no discharge. Left eye exhibits no discharge. No scleral icterus.  Neck: Normal range of motion. Neck supple. No JVD present. No tracheal deviation present. No thyromegaly present.  Cardiovascular: Normal rate, regular rhythm, normal heart sounds and intact distal pulses.  Exam reveals no gallop and no friction rub.   No murmur heard. Pulmonary/Chest: Effort normal and breath sounds normal. No stridor. No respiratory distress. He has no wheezes. He has no rales. He exhibits no tenderness.  Musculoskeletal: Normal range of motion.  Lymphadenopathy:    He has no cervical adenopathy.  Neurological: He is alert and oriented to person, place, and time.  Skin: Skin is warm, dry and intact.  No rash noted. He is not diaphoretic. No erythema. No pallor.  Psychiatric: He has a normal mood and affect. His speech is normal and behavior is normal. Judgment and thought content normal. Cognition and memory are normal.  Nursing note and vitals reviewed.   Results for orders placed or performed in visit on 07/22/15  CBC with Differential/Platelet  Result Value Ref Range   WBC 6.6 4.0 -  10.5 K/uL   RBC 4.39 4.22 - 5.81 Mil/uL   Hemoglobin 14.0 13.0 - 17.0 g/dL   HCT 41.9 39.0 - 52.0 %   MCV 95.5 78.0 - 100.0 fl   MCHC 33.5 30.0 - 36.0 g/dL   RDW 14.8 11.5 - 15.5 %   Platelets 144.0 (L) 150.0 - 400.0 K/uL   Neutrophils Relative % 54.5 43.0 - 77.0 %   Lymphocytes Relative 34.4 12.0 - 46.0 %   Monocytes Relative 8.5 3.0 - 12.0 %   Eosinophils Relative 2.1 0.0 - 5.0 %   Basophils Relative 0.5 0.0 - 3.0 %   Neutro Abs 3.6 1.4 - 7.7 K/uL   Lymphs Abs 2.3 0.7 - 4.0 K/uL   Monocytes Absolute 0.6 0.1 - 1.0 K/uL   Eosinophils Absolute 0.1 0.0 - 0.7 K/uL   Basophils Absolute 0.0 0.0 - 0.1 K/uL  IBC panel  Result Value Ref Range   Iron 81 42 - 165 ug/dL   Transferrin 299.0 212.0 - 360.0 mg/dL   Saturation Ratios 19.4 (L) 20.0 - 50.0 %      Assessment & Plan:   Problem List Items Addressed This Visit      Musculoskeletal and Integument   Arthritis - Primary    Using tramadol appropriately, 60 pills lasting him about 3 months. PMP reviewed and appropriate. Controlled substance agreement signed. Call with any concerns. Refill of tramadol given. Recheck 3 months.      Relevant Medications   traMADol (ULTRAM) 50 MG tablet     Other   Anxiety and depression    Does not want to take the zoloft, states that it made his stomach upset. OK to stop it and is aware that we are planning on decreasing his valium slowly. Continue to monitor and follow up in 1 month as discussed at previous appointment.       Controlled substance agreement signed    For valium and tramadol. See scanned document.           Follow up plan: Return in about 4 weeks (around 02/17/2016) for follow up anxiety.

## 2016-01-20 NOTE — Assessment & Plan Note (Signed)
Using tramadol appropriately, 60 pills lasting him about 3 months. PMP reviewed and appropriate. Controlled substance agreement signed. Call with any concerns. Refill of tramadol given. Recheck 3 months.

## 2016-02-11 ENCOUNTER — Ambulatory Visit (INDEPENDENT_AMBULATORY_CARE_PROVIDER_SITE_OTHER): Payer: Medicare Other | Admitting: Family Medicine

## 2016-02-11 ENCOUNTER — Encounter: Payer: Self-pay | Admitting: Family Medicine

## 2016-02-11 VITALS — BP 119/80 | HR 96 | Temp 97.5°F | Ht 66.5 in | Wt 152.0 lb

## 2016-02-11 DIAGNOSIS — F418 Other specified anxiety disorders: Secondary | ICD-10-CM | POA: Diagnosis not present

## 2016-02-11 DIAGNOSIS — F329 Major depressive disorder, single episode, unspecified: Secondary | ICD-10-CM

## 2016-02-11 DIAGNOSIS — F419 Anxiety disorder, unspecified: Principal | ICD-10-CM

## 2016-02-11 DIAGNOSIS — E785 Hyperlipidemia, unspecified: Secondary | ICD-10-CM

## 2016-02-11 DIAGNOSIS — Z8719 Personal history of other diseases of the digestive system: Secondary | ICD-10-CM | POA: Diagnosis not present

## 2016-02-11 DIAGNOSIS — I10 Essential (primary) hypertension: Secondary | ICD-10-CM | POA: Diagnosis not present

## 2016-02-11 DIAGNOSIS — F32A Depression, unspecified: Secondary | ICD-10-CM

## 2016-02-11 MED ORDER — DIAZEPAM 2 MG PO TABS: 4.0000 mg | ORAL_TABLET | Freq: Three times a day (TID) | ORAL | Status: DC | PRN

## 2016-02-11 NOTE — Assessment & Plan Note (Signed)
Long conversation had today with both the patient and his wife about goals of treatment. He is not happy that we are weaning him off his valium. Again discussed increased risks of fall, dementia and even death. Discussed options, that he can go see psychiatry for evaluation for treatment of his anxiety that may or may not include valium or that he could find another physician if he would like, but that our goal is to slowly wean him off of it. Will keep him on same dose again today, and possibly next month, but will likely decrease again the following month. He is aware of the plan and will discuss his options with his wife and try to decide what to do. Will call with any concerns. Refills given today.

## 2016-02-11 NOTE — Progress Notes (Signed)
BP 119/80 mmHg  Pulse 96  Temp(Src) 97.5 F (36.4 C)  Ht 5' 6.5" (1.689 m)  Wt 152 lb (68.947 kg)  BMI 24.17 kg/m2  SpO2 96%   Subjective:    Patient ID: Brian [name], male    DOB: 1933/07/10, 82 y.o.   MRN: [id number]  HPI: Brian [name] is a 82 y.o. male  Chief Complaint  Patient presents with  . Anxiety   ANXIETY/STRESS- has not been feeling nervous, had 3-4 times where he got really anxious since his last visit.  Duration:stable Anxious mood: yes  Excessive worrying: yes Irritability: no  Sweating: no Nausea: no Palpitations:no Hyperventilation: no Panic attacks: no Agoraphobia: no  Obscessions/compulsions: no Depressed mood: no Depression screen [facility] 2/9 01/14/2016 01/14/2016  Decreased Interest 1 1  Down, Depressed, Hopeless 1 0  PHQ - 2 Score 2 1  Altered sleeping 0 -  Tired, decreased energy 2 -  Change in appetite 1 -  Feeling bad or failure about yourself  0 -  Trouble concentrating 0 -  Moving slowly or fidgety/restless 1 -  PHQ-9 Score 6 -  Difficult doing work/chores Not difficult at all -  Anhedonia: no Weight changes: no Insomnia: no   Hypersomnia: no Fatigue/loss of energy: no Feelings of worthlessness: no Feelings of guilt: no Impaired concentration/indecisiveness: no Suicidal ideations: no  Crying spells: no Recent Stressors/Life Changes: no  Relevant past medical, surgical, family and social history reviewed and updated as indicated. Interim medical history since our last visit reviewed. Allergies and medications reviewed and updated.  Review of Systems  Constitutional: Negative.   Respiratory: Negative.   Cardiovascular: Negative.   Psychiatric/Behavioral: Negative for suicidal ideas, hallucinations, behavioral problems, confusion, sleep disturbance, self-injury, dysphoric mood, decreased concentration and agitation. The patient is nervous/anxious. The patient is not hyperactive.     Per HPI unless specifically indicated above      Objective:    BP 119/80 mmHg  Pulse 96  Temp(Src) 97.5 F (36.4 C)  Ht 5' 6.5" (1.689 m)  Wt 152 lb (68.947 kg)  BMI 24.17 kg/m2  SpO2 96%  Wt Readings from Last 3 Encounters:  02/11/16 152 lb (68.947 kg)  01/20/16 153 lb (69.4 kg)  01/14/16 151 lb (68.493 kg)    Physical Exam  Constitutional: He is oriented to person, place, and time. He appears well-developed and well-nourished. No distress.  HENT:  Head: Normocephalic and atraumatic.  Right Ear: Hearing normal.  Left Ear: Hearing normal.  Nose: Nose normal.  Eyes: Conjunctivae and lids are normal. Right eye exhibits no discharge. Left eye exhibits no discharge. No scleral icterus.  Pulmonary/Chest: Effort normal. No respiratory distress.  Musculoskeletal: Normal range of motion.  Neurological: He is alert and oriented to person, place, and time.  Skin: Skin is warm, dry and intact. No rash noted. He is not diaphoretic. No erythema. No pallor.  Psychiatric: He has a normal mood and affect. His speech is normal and behavior is normal. Judgment and thought content normal. Cognition and memory are normal.  Nursing note and vitals reviewed.   Results for orders placed or performed in visit on 07/22/15  CBC with Differential/Platelet  Result Value Ref Range   WBC 6.6 4.0 - 10.5 K/uL   RBC 4.39 4.22 - 5.81 Mil/uL   Hemoglobin 14.0 13.0 - 17.0 g/dL   HCT 41.9 39.0 - 52.0 %   MCV 95.5 78.0 - 100.0 fl   MCHC 33.5 30.0 - 36.0 g/dL   RDW 14.8 11.5 -  15.5 %   Platelets 144.0 (L) 150.0 - 400.0 K/uL   Neutrophils Relative % 54.5 43.0 - 77.0 %   Lymphocytes Relative 34.4 12.0 - 46.0 %   Monocytes Relative 8.5 3.0 - 12.0 %   Eosinophils Relative 2.1 0.0 - 5.0 %   Basophils Relative 0.5 0.0 - 3.0 %   Neutro Abs 3.6 1.4 - 7.7 K/uL   Lymphs Abs 2.3 0.7 - 4.0 K/uL   Monocytes Absolute 0.6 0.1 - 1.0 K/uL   Eosinophils Absolute 0.1 0.0 - 0.7 K/uL   Basophils Absolute 0.0 0.0 - 0.1 K/uL  IBC panel  Result Value Ref Range   Iron 81  42 - 165 ug/dL   Transferrin 299.0 212.0 - 360.0 mg/dL   Saturation Ratios 19.4 (L) 20.0 - 50.0 %      Assessment & Plan:   Problem List Items Addressed This Visit      Cardiovascular and Mediastinum   HTN (hypertension)    Under good control today. Continue to follow with cardiology. Continue to monitor. Call with any problems.         Other   Anxiety and depression - Primary    Long conversation had today with both the patient and his wife about goals of treatment. He is not happy that we are weaning him off his valium. Again discussed increased risks of fall, dementia and even death. Discussed options, that he can go see psychiatry for evaluation for treatment of his anxiety that may or may not include valium or that he could find another physician if he would like, but that our goal is to slowly wean him off of it. Will keep him on same dose again today, and possibly next month, but will likely decrease again the following month. He is aware of the plan and will discuss his options with his wife and try to decide what to do. Will call with any concerns. Refills given today.      Hyperlipidemia    Labs checked today. Await results. Continue to monitor. Continue to follow with cardiology.      History of GI diverticular bleed    No problems recently. Will check CBC. Await results.           Follow up plan: Return in about 4 weeks (around 03/10/2016) for follow up mood.

## 2016-02-11 NOTE — Assessment & Plan Note (Signed)
Labs checked today. Await results. Continue to monitor. Continue to follow with cardiology.

## 2016-02-11 NOTE — Assessment & Plan Note (Signed)
No problems recently. Will check CBC. Await results.

## 2016-02-11 NOTE — Assessment & Plan Note (Signed)
Under good control today. Continue to follow with cardiology. Continue to monitor. Call with any problems.

## 2016-02-12 LAB — COMPREHENSIVE METABOLIC PANEL
ALK PHOS: 89 IU/L (ref 39–117)
ALT: 17 IU/L (ref 0–44)
AST: 19 IU/L (ref 0–40)
Albumin/Globulin Ratio: 1.7 (ref 1.2–2.2)
Albumin: 4.5 g/dL (ref 3.5–4.7)
BILIRUBIN TOTAL: 0.4 mg/dL (ref 0.0–1.2)
BUN/Creatinine Ratio: 14 (ref 10–22)
BUN: 11 mg/dL (ref 8–27)
CO2: 23 mmol/L (ref 18–29)
Calcium: 9.6 mg/dL (ref 8.6–10.2)
Chloride: 104 mmol/L (ref 96–106)
Creatinine, Ser: 0.79 mg/dL (ref 0.76–1.27)
GFR calc Af Amer: 98 mL/min/{1.73_m2} (ref >59)
GFR calc non Af Amer: 85 mL/min/{1.73_m2} (ref >59)
GLOBULIN, TOTAL: 2.7 g/dL (ref 1.5–4.5)
Glucose: 127 mg/dL — ABNORMAL HIGH (ref 65–99)
Potassium: 4 mmol/L (ref 3.5–5.2)
SODIUM: 146 mmol/L — AB (ref 134–144)
Total Protein: 7.2 g/dL (ref 6.0–8.5)

## 2016-02-12 LAB — CBC WITH DIFFERENTIAL/PLATELET
BASOS ABS: 0 10*3/uL (ref 0.0–0.2)
Basos: 0 %
EOS (ABSOLUTE): 0.1 10*3/uL (ref 0.0–0.4)
EOS: 1 %
Hematocrit: 45.9 % (ref 37.5–51.0)
Hemoglobin: 15.7 g/dL (ref 12.6–17.7)
IMMATURE GRANULOCYTES: 0 %
Immature Grans (Abs): 0 10*3/uL (ref 0.0–0.1)
LYMPHS: 19 %
Lymphocytes Absolute: 1.5 10*3/uL (ref 0.7–3.1)
MCH: 32.6 pg (ref 26.6–33.0)
MCHC: 34.2 g/dL (ref 31.5–35.7)
MCV: 95 fL (ref 79–97)
MONOCYTES: 9 %
Monocytes Absolute: 0.7 10*3/uL (ref 0.1–0.9)
NEUTROS PCT: 71 %
Neutrophils Absolute: 5.7 10*3/uL (ref 1.4–7.0)
Platelets: 174 10*3/uL (ref 150–379)
RBC: 4.82 x10E6/uL (ref 4.14–5.80)
RDW: 13.6 % (ref 12.3–15.4)
WBC: 8.1 10*3/uL (ref 3.4–10.8)

## 2016-02-12 LAB — LIPID PANEL W/O CHOL/HDL RATIO
Cholesterol, Total: 138 mg/dL (ref 100–199)
HDL: 74 mg/dL (ref >39)
LDL Calculated: 49 mg/dL (ref 0–99)
TRIGLYCERIDES: 75 mg/dL (ref 0–149)
VLDL Cholesterol Cal: 15 mg/dL (ref 5–40)

## 2016-02-12 LAB — TSH: TSH: 2.27 u[IU]/mL (ref 0.450–4.500)

## 2016-02-14 ENCOUNTER — Encounter: Payer: Self-pay | Admitting: Family Medicine

## 2016-02-15 ENCOUNTER — Telehealth: Payer: Self-pay | Admitting: Family Medicine

## 2016-02-15 NOTE — Telephone Encounter (Signed)
Forward to provider

## 2016-02-15 NOTE — Telephone Encounter (Signed)
Dane from [employer] called and said that the limit is 120 on diazepam (VALIUM) 2 MG tablet and the refill request was for 180.

## 2016-02-15 NOTE — Telephone Encounter (Signed)
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 He can pay for the extra out of pocket if he'd like. It's generally an inexpensive medicine.

## 2016-02-15 NOTE — Telephone Encounter (Signed)
Left message for patient to return my call so that I can notify him that he will need to pay out of pocket for the rest of his medication.

## 2016-02-16 NOTE — Telephone Encounter (Signed)
Spoke with patient's wife and explained it was HIS choice to pay or not pay for full Rx.  Insurance would only pay for 120 pills

## 2016-02-28 ENCOUNTER — Ambulatory Visit: Payer: Self-pay | Admitting: Family Medicine

## 2016-03-15 ENCOUNTER — Ambulatory Visit (INDEPENDENT_AMBULATORY_CARE_PROVIDER_SITE_OTHER): Payer: Medicare Other | Admitting: Family Medicine

## 2016-03-15 ENCOUNTER — Encounter: Payer: Self-pay | Admitting: Family Medicine

## 2016-03-15 VITALS — BP 143/82 | HR 70 | Temp 98.5°F | Wt 153.0 lb

## 2016-03-15 DIAGNOSIS — F418 Other specified anxiety disorders: Secondary | ICD-10-CM | POA: Diagnosis not present

## 2016-03-15 DIAGNOSIS — F329 Major depressive disorder, single episode, unspecified: Secondary | ICD-10-CM

## 2016-03-15 DIAGNOSIS — I10 Essential (primary) hypertension: Secondary | ICD-10-CM

## 2016-03-15 DIAGNOSIS — F419 Anxiety disorder, unspecified: Principal | ICD-10-CM

## 2016-03-15 DIAGNOSIS — F32A Depression, unspecified: Secondary | ICD-10-CM

## 2016-03-15 LAB — MICROALBUMIN, URINE WAIVED
Creatinine, Urine Waived: 10 mg/dL (ref 10–300)
MICROALB, UR WAIVED: 10 mg/L (ref 0–19)

## 2016-03-15 MED ORDER — DIAZEPAM 2 MG PO TABS: 4.0000 mg | ORAL_TABLET | Freq: Three times a day (TID) | ORAL | Status: DC | PRN

## 2016-03-15 MED ORDER — TRAMADOL HCL 50 MG PO TABS: 50.0000 mg | ORAL_TABLET | ORAL | Status: AC | PRN

## 2016-03-15 NOTE — Assessment & Plan Note (Signed)
Better on recheck. Continue to monitor. Call with any concerns.  

## 2016-03-15 NOTE — Assessment & Plan Note (Signed)
Patient would like to see Dr. Stephanie Coup, who he and his daughter talked to and would be willing to see him for his anxiety and pain. He needs a referral to see them today, which was generated. We will continue him on his current regimen for now and will refill his Rx if not able to see Dr. Humphrey Rolls in the next month, otherwise Dr. Humphrey Rolls to take over and we will see him of the remainder of his medical conditions. Both he and his daughter are aware.

## 2016-03-15 NOTE — Patient Instructions (Signed)
Lidocaine patches.

## 2016-03-15 NOTE — Progress Notes (Signed)
BP 143/82 mmHg  Pulse 70  Temp(Src) 98.5 F (36.9 C)  Wt 153 lb (69.4 kg)  SpO2 94%   Subjective:    Patient ID: Brian [name], male    DOB: 1933/04/28, 82 y.o.   MRN: [id number]  HPI: Brian [name] is a 82 y.o. male  Chief Complaint  Patient presents with  . Anxiety   ANXIETY/STRESS Duration:stable Anxious mood: yes  Excessive worrying: yes Irritability: no  Sweating: no Nausea: no Palpitations:no Hyperventilation: no Panic attacks: no Agoraphobia: no  Obscessions/compulsions: no Depressed mood: no Depression screen [facility] 2/9 01/14/2016 01/14/2016  Decreased Interest 1 1  Down, Depressed, Hopeless 1 0  PHQ - 2 Score 2 1  Altered sleeping 0 -  Tired, decreased energy 2 -  Change in appetite 1 -  Feeling bad or failure about yourself  0 -  Trouble concentrating 0 -  Moving slowly or fidgety/restless 1 -  PHQ-9 Score 6 -  Difficult doing work/chores Not difficult at all -   GAD 7 : Generalized Anxiety Score 03/15/2016 02/11/2016 01/14/2016  Nervous, Anxious, on Edge 2 2 1   Control/stop worrying 2 0 1  Worry too much - different things 2 0 1  Trouble relaxing 2 2 1   Restless 0 2 0  Easily annoyed or irritable 0 0 1  Afraid - awful might happen 2 2 1   Total GAD 7 Score 10 8 6   Anxiety Difficulty Somewhat difficult - Not difficult at all   Anhedonia: no Weight changes: no Insomnia: no hard to stay asleep  Hypersomnia: no Fatigue/loss of energy: no Feelings of worthlessness: no Feelings of guilt: no Impaired concentration/indecisiveness: no Suicidal ideations: no  Crying spells: no Recent Stressors/Life Changes: no   Relationship problems: no   Family stress: no     Financial stress: no    Job stress: no    Recent death/loss: no  Relevant past medical, surgical, family and social history reviewed and updated as indicated. Interim medical history since our last visit reviewed. Allergies and medications reviewed and updated.  Review of Systems  Constitutional:  Negative.   Respiratory: Negative.   Cardiovascular: Negative.   Psychiatric/Behavioral: Negative for suicidal ideas, hallucinations, behavioral problems, confusion, sleep disturbance, self-injury, dysphoric mood, decreased concentration and agitation. The patient is nervous/anxious. The patient is not hyperactive.     Per HPI unless specifically indicated above     Objective:    BP 143/82 mmHg  Pulse 70  Temp(Src) 98.5 F (36.9 C)  Wt 153 lb (69.4 kg)  SpO2 94%  Wt Readings from Last 3 Encounters:  03/15/16 153 lb (69.4 kg)  02/11/16 152 lb (68.947 kg)  01/20/16 153 lb (69.4 kg)    Physical Exam  Constitutional: He is oriented to person, place, and time. He appears well-developed and well-nourished. No distress.  HENT:  Head: Normocephalic and atraumatic.  Right Ear: Hearing normal.  Left Ear: Hearing normal.  Nose: Nose normal.  Eyes: Conjunctivae and lids are normal. Right eye exhibits no discharge. Left eye exhibits no discharge. No scleral icterus.  Cardiovascular: Normal rate, regular rhythm, normal heart sounds and intact distal pulses.  Exam reveals no gallop and no friction rub.   No murmur heard. Pulmonary/Chest: Effort normal and breath sounds normal. No respiratory distress. He has no wheezes. He has no rales. He exhibits no tenderness.  Musculoskeletal: Normal range of motion.  Neurological: He is alert and oriented to person, place, and time.  Skin: Skin is warm, dry and intact.  No rash noted. No erythema. No pallor.  Psychiatric: He has a normal mood and affect. His speech is normal and behavior is normal. Judgment and thought content normal. Cognition and memory are normal.  Nursing note and vitals reviewed.   Results for orders placed or performed in visit on 02/11/16  Comprehensive metabolic panel  Result Value Ref Range   Glucose 127 (H) 65 - 99 mg/dL   BUN 11 8 - 27 mg/dL   Creatinine, Ser 0.79 0.76 - 1.27 mg/dL   GFR calc non Af Amer 85 >59  mL/min/1.73   GFR calc Af Amer 98 >59 mL/min/1.73   BUN/Creatinine Ratio 14 10 - 22   Sodium 146 (H) 134 - 144 mmol/L   Potassium 4.0 3.5 - 5.2 mmol/L   Chloride 104 96 - 106 mmol/L   CO2 23 18 - 29 mmol/L   Calcium 9.6 8.6 - 10.2 mg/dL   Total Protein 7.2 6.0 - 8.5 g/dL   Albumin 4.5 3.5 - 4.7 g/dL   Globulin, Total 2.7 1.5 - 4.5 g/dL   Albumin/Globulin Ratio 1.7 1.2 - 2.2   Bilirubin Total 0.4 0.0 - 1.2 mg/dL   Alkaline Phosphatase 89 39 - 117 IU/L   AST 19 0 - 40 IU/L   ALT 17 0 - 44 IU/L  CBC with Differential/Platelet  Result Value Ref Range   WBC 8.1 3.4 - 10.8 x10E3/uL   RBC 4.82 4.14 - 5.80 x10E6/uL   Hemoglobin 15.7 12.6 - 17.7 g/dL   Hematocrit 45.9 37.5 - 51.0 %   MCV 95 79 - 97 fL   MCH 32.6 26.6 - 33.0 pg   MCHC 34.2 31.5 - 35.7 g/dL   RDW 13.6 12.3 - 15.4 %   Platelets 174 150 - 379 x10E3/uL   Neutrophils 71 %   Lymphs 19 %   Monocytes 9 %   Eos 1 %   Basos 0 %   Neutrophils Absolute 5.7 1.4 - 7.0 x10E3/uL   Lymphocytes Absolute 1.5 0.7 - 3.1 x10E3/uL   Monocytes Absolute 0.7 0.1 - 0.9 x10E3/uL   EOS (ABSOLUTE) 0.1 0.0 - 0.4 x10E3/uL   Basophils Absolute 0.0 0.0 - 0.2 x10E3/uL   Immature Granulocytes 0 %   Immature Grans (Abs) 0.0 0.0 - 0.1 x10E3/uL  Lipid Panel w/o Chol/HDL Ratio  Result Value Ref Range   Cholesterol, Total 138 100 - 199 mg/dL   Triglycerides 75 0 - 149 mg/dL   HDL 74 >39 mg/dL   VLDL Cholesterol Cal 15 5 - 40 mg/dL   LDL Calculated 49 0 - 99 mg/dL  TSH  Result Value Ref Range   TSH 2.270 0.450 - 4.500 uIU/mL      Assessment & Plan:   Problem List Items Addressed This Visit      Cardiovascular and Mediastinum   HTN (hypertension)    Better on recheck. Continue to monitor. Call with any concerns.      Relevant Orders   Microalbumin, Urine Waived     Other   Anxiety and depression - Primary    Patient would like to see Dr. Stephanie Coup, who he and his daughter talked to and would be willing to see him for his anxiety and pain.  He needs a referral to see them today, which was generated. We will continue him on his current regimen for now and will refill his Rx if not able to see Dr. Humphrey Rolls in the next month, otherwise Dr. Humphrey Rolls to take over and we  will see him of the remainder of his medical conditions. Both he and his daughter are aware.       Relevant Orders   Ambulatory referral to Pain Clinic       Follow up plan: Return in about 3 months (around 06/15/2016) for Follow up.

## 2016-05-04 DIAGNOSIS — I2510 Atherosclerotic heart disease of native coronary artery without angina pectoris: Secondary | ICD-10-CM | POA: Diagnosis not present

## 2016-05-04 DIAGNOSIS — I10 Essential (primary) hypertension: Secondary | ICD-10-CM | POA: Diagnosis not present

## 2016-05-04 DIAGNOSIS — E785 Hyperlipidemia, unspecified: Secondary | ICD-10-CM | POA: Diagnosis not present

## 2016-05-04 IMAGING — NM NM GI BLOOD LOSS
6 series · 17 of 17 positions shown · non-contrast
Comparison: None.

CLINICAL DATA: 82-year-old male with coronary artery disease and
stent placement in May 2014 for which he was placed on
anticoagulation. He presented with GI bleeding April 2015, and was
discharged after observation. He was taken off of his
anticoagulation for 5 days and colonoscopy was performed 06/01/2015
which revealed diverticulosis and colonic polyps, some of which were
removed. Patient restarted his anticoagulation yesterday and today
he had multiple episodes of bright red blood per rectum with clots.

EXAM:
NUCLEAR MEDICINE GASTROINTESTINAL BLEEDING SCAN
TECHNIQUE: Sequential abdominal images were obtained following intravenous
administration of 4c-33m labeled red blood cells.
RADIOPHARMACEUTICALS:  21.5 mCi 4c-33m in-vitro labeled red cells.

[Series 1000: hour 1 gi bleed · 4.80mm/px · 6 of 60 frames shown]
[frame 6/60]
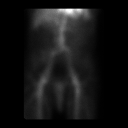
[frame 16/60]
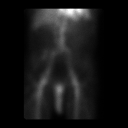
[frame 26/60]
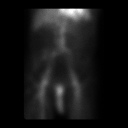
[frame 36/60]
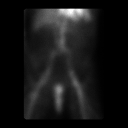
[frame 46/60]
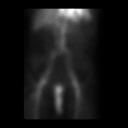
[frame 56/60]
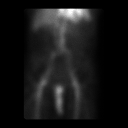

[Series 1000: gi bleed static · 2.40mm/px · 2 of 2 frames shown (1 of 2)]
[frame 1/2]
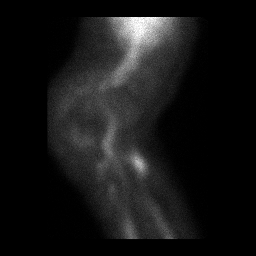
[frame 2/2]
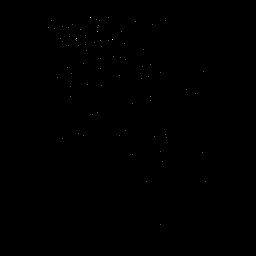

[Series 1000: gi bleed static save_screens · 1 of 1 slices shown (1 of 2)]
[im 1/1]
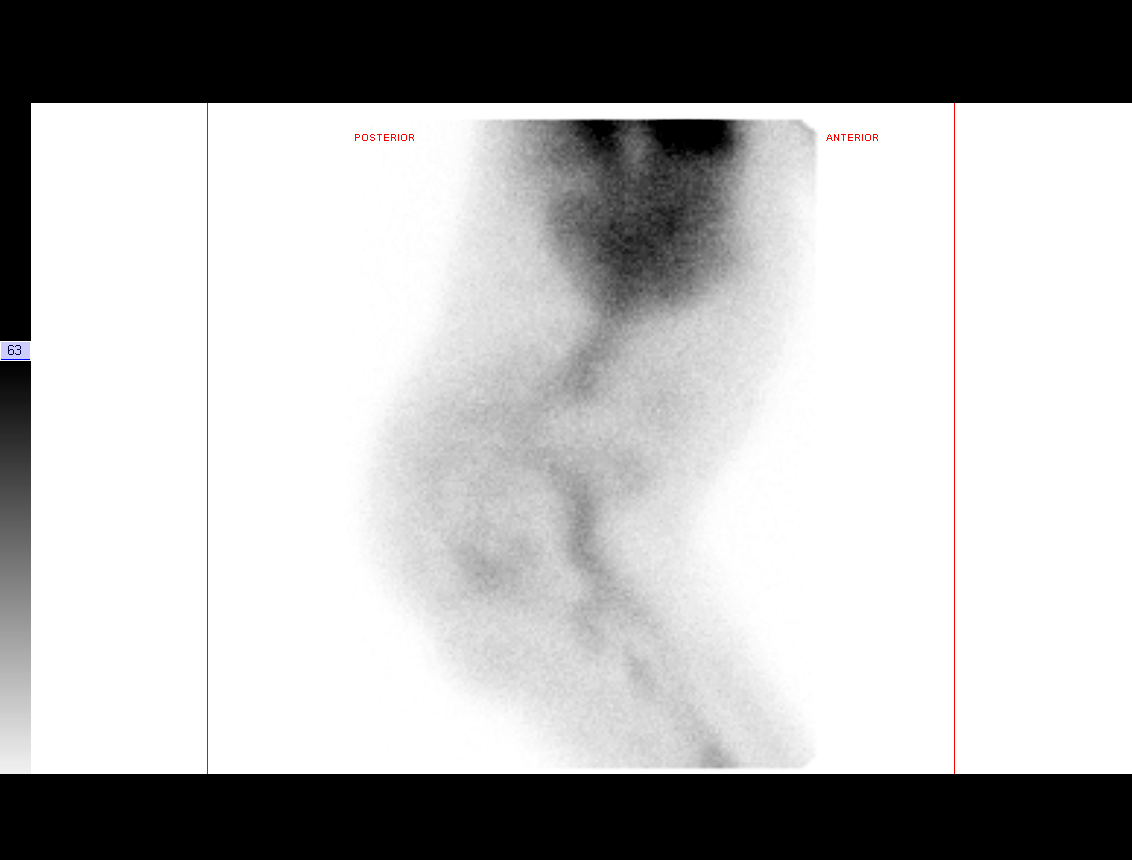

[Series 1000: gi bleed static · 2.40mm/px · 1 of 1 slices shown (2 of 2)]
[im 1/1]
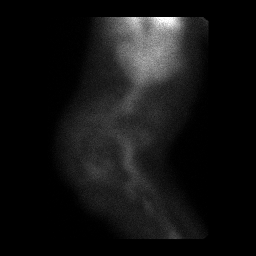

[Series 1000: gi bleed static save_screens · 1 of 1 slices shown (2 of 2)]
[im 1/1]
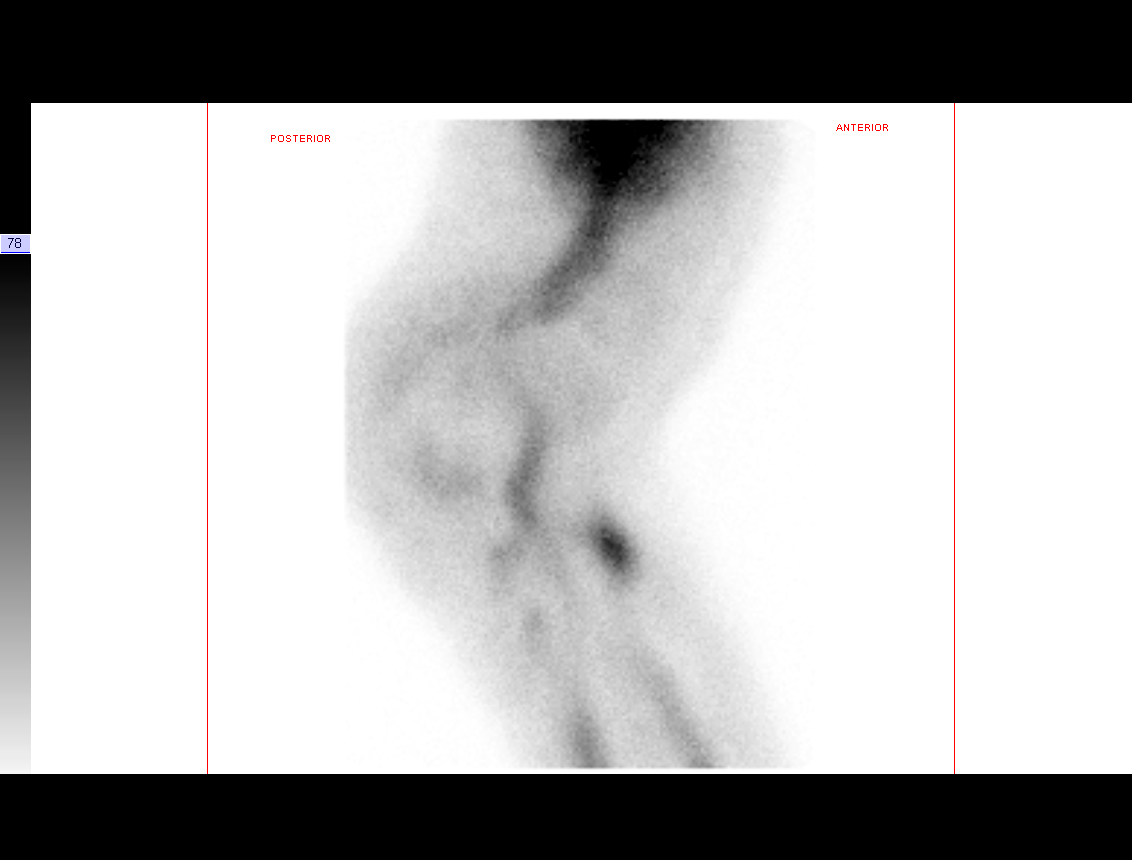

[Series 1000: gi bleed hour 2 · 4.80mm/px · 6 of 60 frames shown]
[frame 6/60]
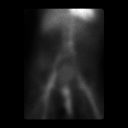
[frame 16/60]
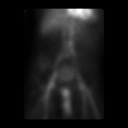
[frame 26/60]
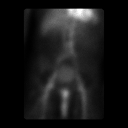
[frame 36/60]
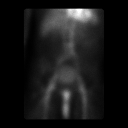
[frame 46/60]
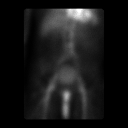
[frame 56/60]
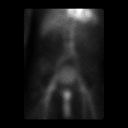

[17 of 17 positions shown; findings below may reference images not displayed]

FINDINGS: Imaging in the AP projection was carried [DATE] hours and lateral
images were obtained at 1 hour and 2 hr. Midline low pelvic activity
on the images up to 1 hour were performed. The lateral image shows
that the activity is anterior, and the technologist states that the
patient had multiple episodes of urination during the procedure with
a urinal in place, accounting for the activity. No bowel activity is
identified on the images up to 1 hour.

The 2 hour AP images demonstrate activity within the urinary
bladder. The activity within the urinal is again noted. The lateral
to our image demonstrates emptying of the urinary bladder. Again, no
bowel activity is identified on the images up to 2 hr.

Expected blood pool activity and excretion into the urinary tract is
present on all images.
IMPRESSION: No source of active gastrointestinal bleeding is identified.

Results were telephoned to Dr. Qomandan Tiger at the time of
interpretation on 06/05/2015 at 3282 hr.

## 2016-06-15 ENCOUNTER — Encounter: Payer: Self-pay | Admitting: Family Medicine

## 2016-06-15 ENCOUNTER — Ambulatory Visit (INDEPENDENT_AMBULATORY_CARE_PROVIDER_SITE_OTHER): Payer: Medicare Other | Admitting: Family Medicine

## 2016-06-15 VITALS — BP 122/68 | HR 52 | Temp 98.5°F | Wt 154.0 lb

## 2016-06-15 DIAGNOSIS — F5089 Other specified eating disorder: Secondary | ICD-10-CM

## 2016-06-15 LAB — CBC WITH DIFFERENTIAL/PLATELET
HEMATOCRIT: 40.9 % (ref 37.5–51.0)
Hemoglobin: 14.4 g/dL (ref 12.6–17.7)
LYMPHS ABS: 2.2 10*3/uL (ref 0.7–3.1)
LYMPHS: 35 %
MCH: 34 pg — ABNORMAL HIGH (ref 26.6–33.0)
MCHC: 35.2 g/dL (ref 31.5–35.7)
MCV: 97 fL (ref 79–97)
MID (ABSOLUTE): 0.7 10*3/uL (ref 0.1–1.6)
MID: 11 %
Neutrophils Absolute: 3.4 10*3/uL (ref 1.4–7.0)
Neutrophils: 54 %
PLATELETS: 136 10*3/uL — AB (ref 150–379)
RBC: 4.24 x10E6/uL (ref 4.14–5.80)
RDW: 13.1 % (ref 12.3–15.4)
WBC: 6.3 10*3/uL (ref 3.4–10.8)

## 2016-06-15 NOTE — Progress Notes (Signed)
   BP 122/68 (BP Location: Left Arm, Patient Position: Sitting, Cuff Size: Normal)   Pulse (!) 52   Temp 98.5 F (36.9 C)   Wt 154 lb (69.9 kg)   SpO2 96%   BMI 24.48 kg/m    Subjective:    Patient ID: Brian [name], male    DOB: Sep 14, 1933, 82 y.o.   MRN: [id number]  HPI: Brian [name] is a 82 y.o. male  Chief Complaint  Patient presents with  . Other    Pica   PICA Duration: About a year Status: stable Compliance with treatment: not on anything Fatigue: yes Decreased exercise tolerance: yes  Dyspnea on exertion: no Palpitations: no Bleeding: no Pica: yes  Following with Dr. Humphrey Rolls for his anxiety and pain and doing well with him. No concerns.   Relevant past medical, surgical, family and social history reviewed and updated as indicated. Interim medical history since our last visit reviewed. Allergies and medications reviewed and updated.  Review of Systems  Constitutional: Negative.   Respiratory: Negative.   Cardiovascular: Negative.   Psychiatric/Behavioral: Negative.     Per HPI unless specifically indicated above     Objective:    BP 122/68 (BP Location: Left Arm, Patient Position: Sitting, Cuff Size: Normal)   Pulse (!) 52   Temp 98.5 F (36.9 C)   Wt 154 lb (69.9 kg)   SpO2 96%   BMI 24.48 kg/m   Wt Readings from Last 3 Encounters:  06/15/16 154 lb (69.9 kg)  03/15/16 153 lb (69.4 kg)  02/11/16 152 lb (68.9 kg)    Physical Exam  Constitutional: He is oriented to person, place, and time. He appears well-developed and well-nourished. No distress.  HENT:  Head: Normocephalic and atraumatic.  Right Ear: Hearing normal.  Left Ear: Hearing normal.  Nose: Nose normal.  Eyes: Conjunctivae and lids are normal. Right eye exhibits no discharge. Left eye exhibits no discharge. No scleral icterus.  Cardiovascular: Normal rate, regular rhythm and intact distal pulses.  Exam reveals no gallop and no friction rub.   No murmur heard. Pulmonary/Chest:  Effort normal and breath sounds normal. No respiratory distress. He has no wheezes. He has no rales. He exhibits no tenderness.  Musculoskeletal: Normal range of motion.  Neurological: He is alert and oriented to person, place, and time.  Skin: Skin is warm, dry and intact. No rash noted. No erythema. No pallor.  Psychiatric: He has a normal mood and affect. His speech is normal and behavior is normal. Judgment and thought content normal. Cognition and memory are normal.  Nursing note and vitals reviewed.   Results for orders placed or performed in visit on 03/15/16  Microalbumin, Urine Waived  Result Value Ref Range   Microalb, Ur Waived 10 0 - 19 mg/L   Creatinine, Urine Waived 10 10 - 300 mg/dL   Microalb/Creat Ratio 30-300 (H) <30 mg/g      Assessment & Plan:   Problem List Items Addressed This Visit    None    Visit Diagnoses    Pica    -  Primary   Will check CBC and iron studies. Await results. Recommended MVI with iron.    Relevant Orders   CBC With Differential/Platelet   Iron and TIBC   Ferritin       Follow up plan: Return in about 6 months (around 12/16/2016) for Wellness.

## 2016-06-15 NOTE — Patient Instructions (Addendum)
Iron-Rich Diet Iron is a mineral that helps your body to produce hemoglobin. Hemoglobin is a protein in your red blood cells that carries oxygen to your body's tissues. Eating too little iron may cause you to feel weak and tired, and it can increase your risk for infection. Eating enough iron is necessary for your body's metabolism, muscle function, and nervous system. Iron is naturally found in many foods. It can also be added to foods or fortified in foods. There are two types of dietary iron:  Heme iron. Heme iron is absorbed by the body more easily than nonheme iron. Heme iron is found in meat, poultry, and fish.  Nonheme iron. Nonheme iron is found in dietary supplements, iron-fortified grains, beans, and vegetables. You may need to follow an iron-rich diet if:  You have been diagnosed with iron deficiency or iron-deficiency anemia.  You have a condition that prevents you from absorbing dietary iron, such as:  Infection in your intestines.  Celiac disease. This involves long-lasting (chronic) inflammation of your intestines.  You do not eat enough iron.  You eat a diet that is high in foods that impair iron absorption.  You have lost a lot of blood.  You have heavy bleeding during your menstrual cycle.  You are pregnant. WHAT IS MY PLAN? Your health care provider may help you to determine how much iron you need per day based on your condition. Generally, when a person consumes sufficient amounts of iron in the diet, the following iron needs are met:  Men.  56-2 years old: 11 mg per day.  12-73 years old: 8 mg per day.  Women.   56-4 years old: 15 mg per day.  35-31 years old: 18 mg per day.  Over 82 years old: 8 mg per day.  Pregnant women: 27 mg per day.  Breastfeeding women: 9 mg per day. WHAT DO I NEED TO KNOW ABOUT AN IRON-RICH DIET?  Eat fresh fruits and vegetables that are high in vitamin C along with foods that are high in iron. This will help increase  the amount of iron that your body absorbs from food, especially with foods containing nonheme iron. Foods that are high in vitamin C include oranges, peppers, tomatoes, and mango.  Take iron supplements only as directed by your health care provider. Overdose of iron can be life-threatening. If you were prescribed iron supplements, take them with orange juice or a vitamin C supplement.  Cook foods in pots and pans that are made from iron.   Eat nonheme iron-containing foods alongside foods that are high in heme iron. This helps to improve your iron absorption.   Certain foods and drinks contain compounds that impair iron absorption. Avoid eating these foods in the same meal as iron-rich foods or with iron supplements. These include:  Coffee, black tea, and red wine.  Milk, dairy products, and foods that are high in calcium.  Beans, soybeans, and peas.  Whole grains.  When eating foods that contain both nonheme iron and compounds that impair iron absorption, follow these tips to absorb iron better.   Soak beans overnight before cooking.  Soak whole grains overnight and drain them before using.  Ferment flours before baking, such as using yeast in bread dough. WHAT FOODS CAN I EAT? Grains Iron-fortified breakfast cereal. Iron-fortified whole-wheat bread. Enriched rice. Sprouted grains. Vegetables Spinach. Potatoes with skin. Green peas. Broccoli. Red and green bell peppers. Fermented vegetables. Fruits Prunes. Raisins. Oranges. Strawberries. Mango. Grapefruit. Meats and Other Protein  Sources Beef liver. Oysters. Beef. Shrimp. [place]. Chicken. [place]. Sardines. Chickpeas. Nuts. Tofu. Beverages Tomato juice. Fresh orange juice. Prune juice. Hibiscus tea. Fortified instant breakfast shakes. Condiments Tahini. Fermented soy sauce. Sweets and Desserts Black-strap molasses.  Other Wheat germ. The items listed above may not be a complete list of recommended foods or  beverages. Contact your dietitian for more options. WHAT FOODS ARE NOT RECOMMENDED? Grains Whole grains. Bran cereal. Bran flour. Oats. Vegetables Artichokes. Brussels sprouts. Kale. Fruits Blueberries. Raspberries. Strawberries. Figs. Meats and Other Protein Sources Soybeans. Products made from soy protein. Dairy Milk. Cream. Cheese. Yogurt. Cottage cheese. Beverages Coffee. Black tea. Red wine. Sweets and Desserts Cocoa. Chocolate. Ice cream. Other Basil. Oregano. Parsley. The items listed above may not be a complete list of foods and beverages to avoid. Contact your dietitian for more information.   This information is not intended to replace advice given to you by your health care provider. Make sure you discuss any questions you have with your health care provider.   Document Released: 06/13/2005 Document Revised: 11/20/2014 Document Reviewed: 05/27/2014 Elsevier Interactive Patient Education [employer].

## 2016-06-16 ENCOUNTER — Telehealth: Payer: Self-pay | Admitting: Family Medicine

## 2016-06-16 LAB — IRON AND TIBC
Iron Saturation: 37 % (ref 15–55)
Iron: 106 ug/dL (ref 38–169)
TIBC: 290 ug/dL (ref 250–450)
UIBC: 184 ug/dL (ref 111–343)

## 2016-06-16 LAB — FERRITIN: FERRITIN: 37 ng/mL (ref 30–400)

## 2016-06-16 NOTE — Telephone Encounter (Signed)
Please let him know that his iron studies were normal, but he can still take a multivitamin with iron if he would like. Thanks!

## 2016-06-16 NOTE — Telephone Encounter (Signed)
Patients wife notified

## 2016-07-08 IMAGING — US US RETROPERITONEAL COMPLETE
1 series · 14 of 25 positions shown · non-contrast
Comparison: 01/02/2014

CLINICAL DATA: Follow-up abdominal aortic aneurysm

EXAM:
ULTRASOUND RETROPERITONEAL COMPLETE
TECHNIQUE: Ultrasound examination of the abdominal aorta was performed to
evaluate for abdominal aortic aneurysm. The common iliac arteries,
IVC, and kidneys were also evaluated.

[Series 1: us retroperitoneal complete · 0.25mm/px · 14 of 92 slices shown]
[im 1/92]
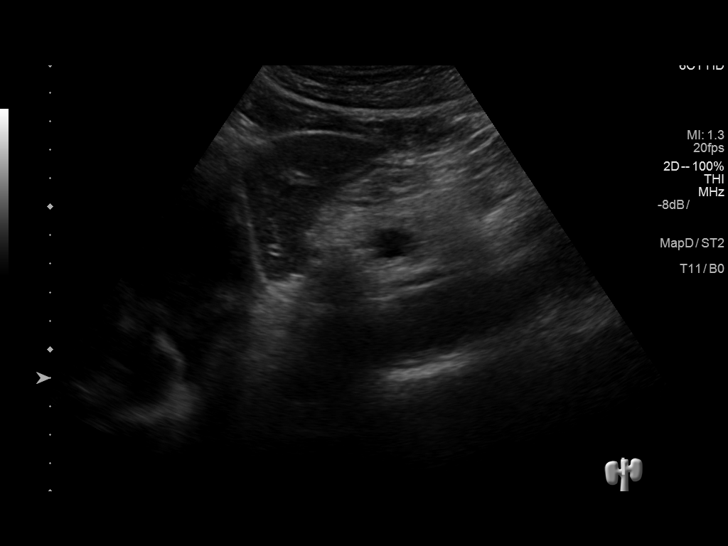
[im 8/92]
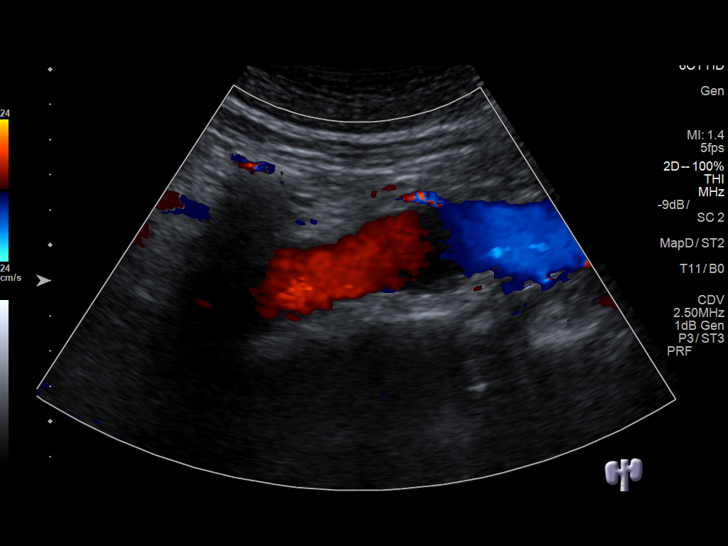
[im 16/92]
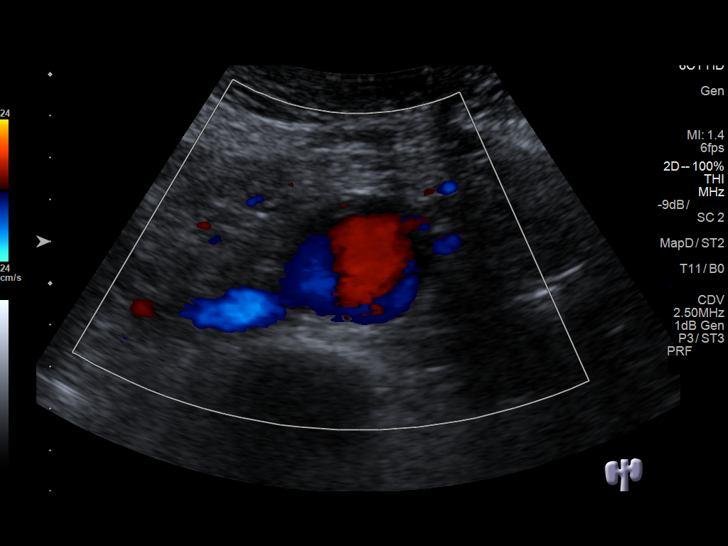
[im 23/92]
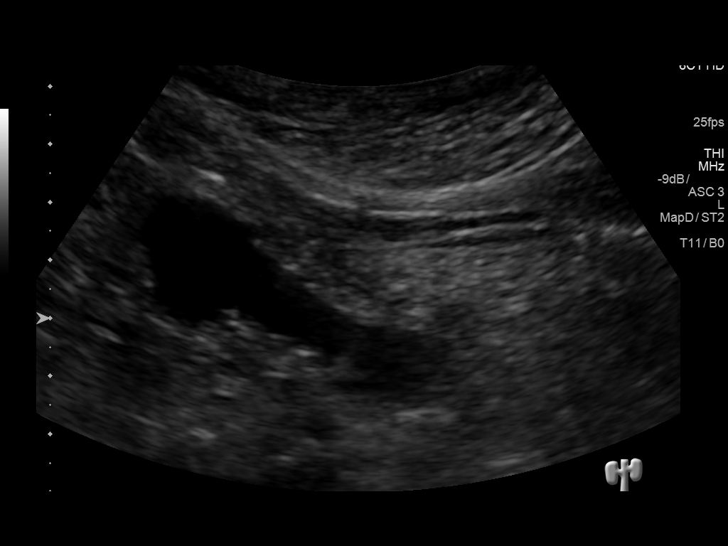
[im 31/92]
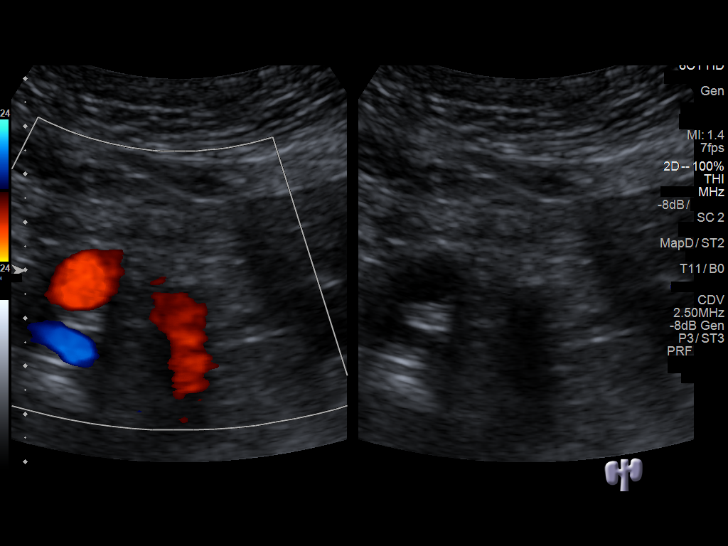
[im 35/92]
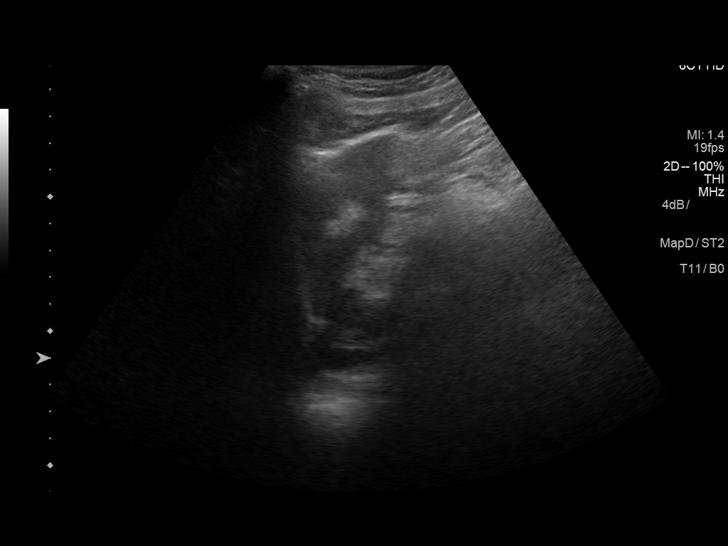
[im 42/92]
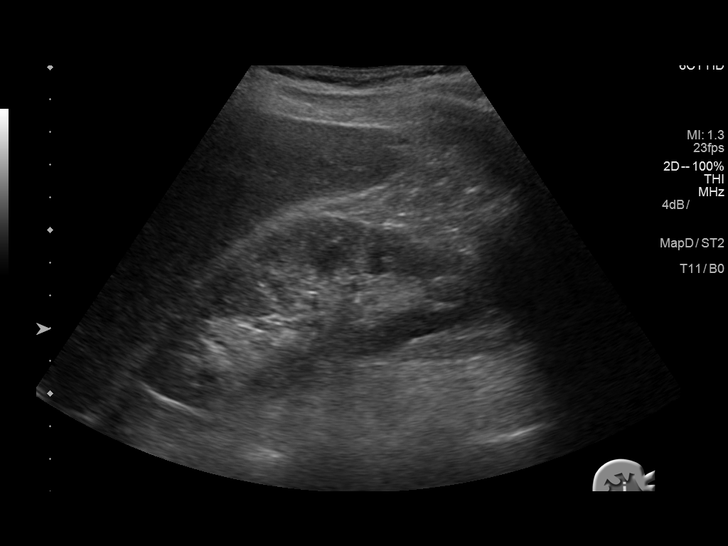
[im 50/92]
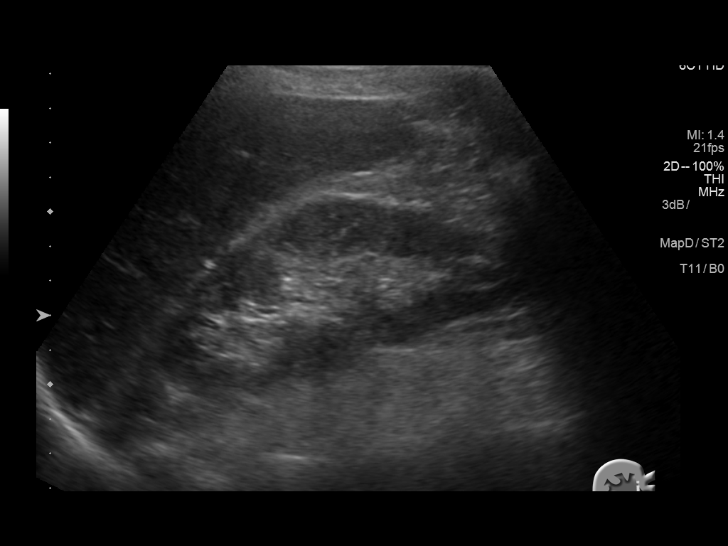
[im 57/92]
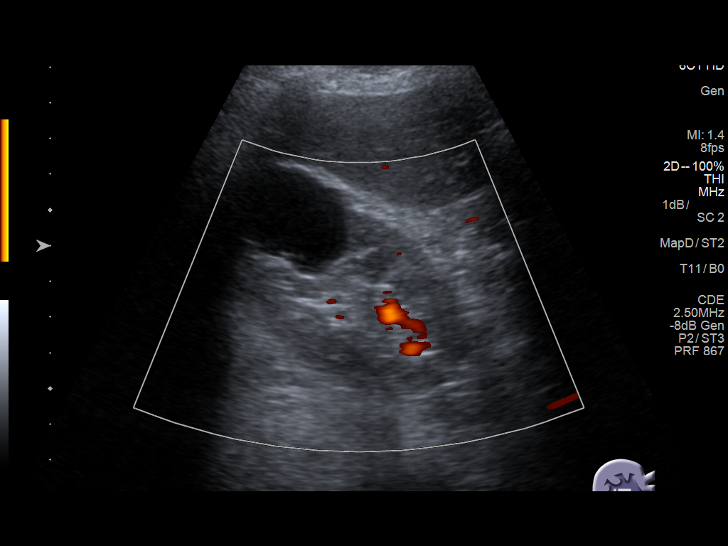
[im 61/92]
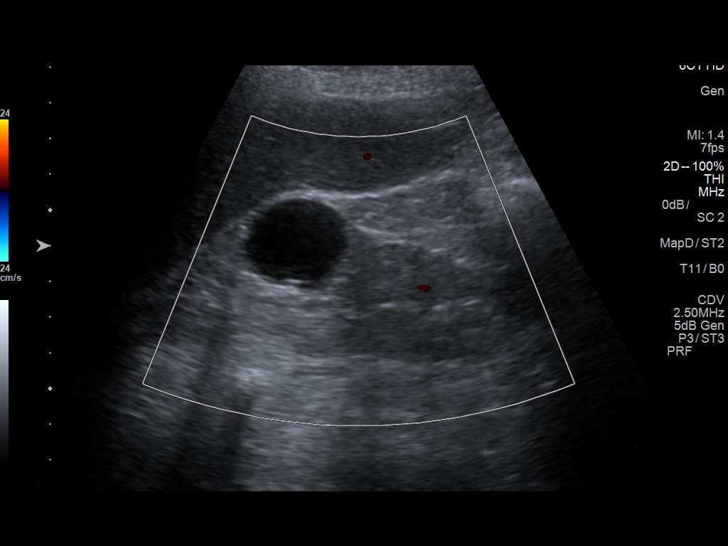
[im 69/92]
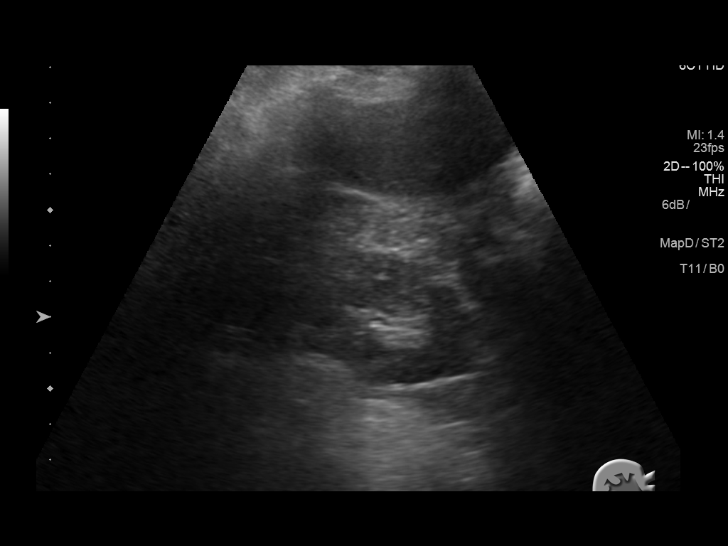
[im 76/92]
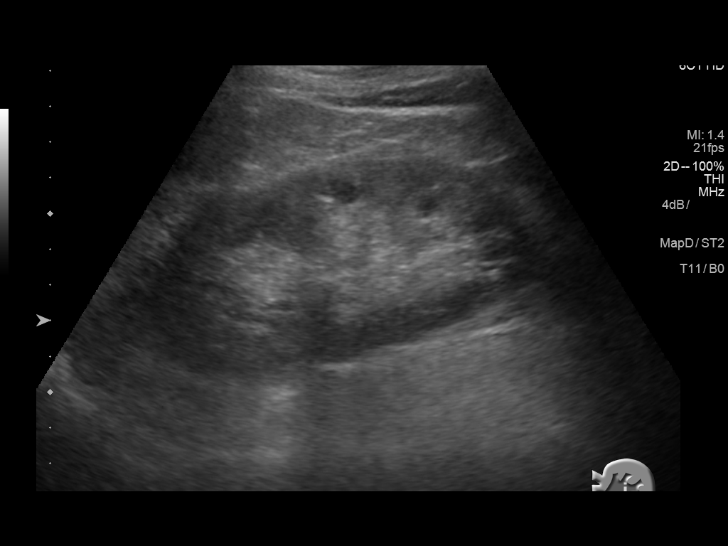
[im 84/92]
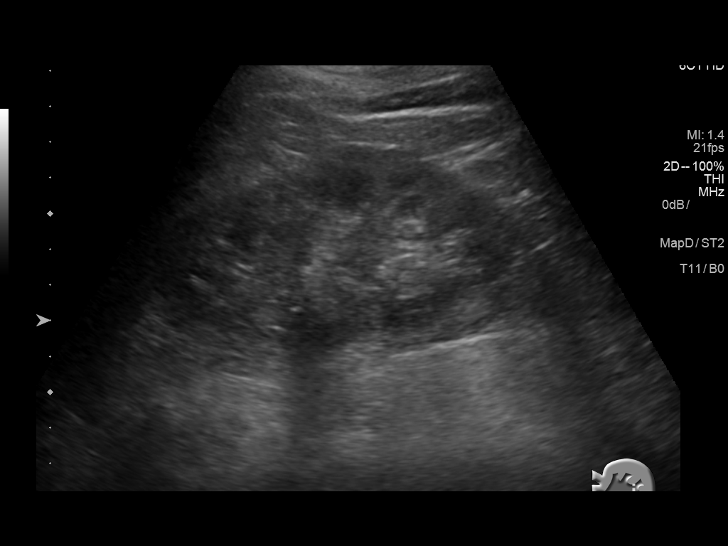
[im 92/92]
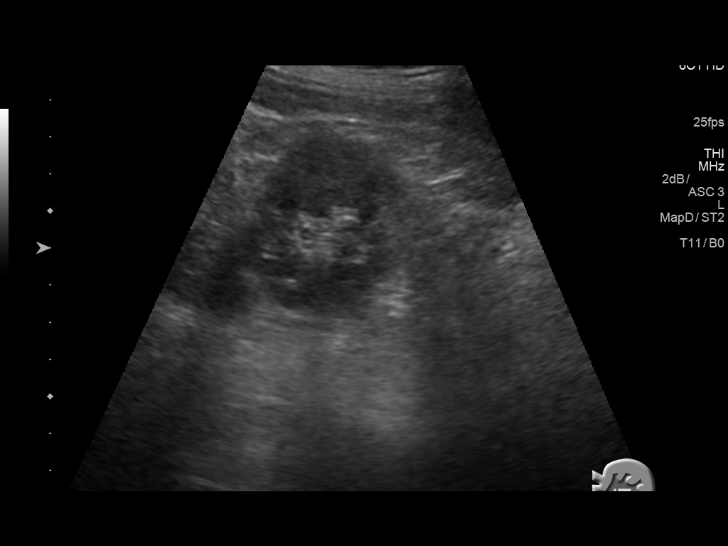

[14 of 25 positions shown; findings below may reference images not displayed]

FINDINGS: Abdominal Aorta

Mild aneurysmal dilatation is noted in the mid to distal abdominal
aorta.

Maximum AP

Diameter:  3.6 cm.

Maximum TRV

Diameter: 3.7 cm.

Right Common Iliac Artery

11 mm without aneurysmal dilatation.

Left Common Iliac Artery

12 mm without aneurysmal dilatation.

IVC

Within normal limits.

Right Kidney

Length: 11 cm.  3.2 cm cyst is noted within the right kidney.

Left Kidney

Length: 11.4 cm. Echogenicity within normal limits. No mass or
hydronephrosis visualized.
IMPRESSION: Abdominal aortic aneurysm which is increased in size slightly from
3.3 cm on the prior exam to 3.7 cm on the current study.

Stable right renal cyst.

## 2016-07-12 DIAGNOSIS — H2513 Age-related nuclear cataract, bilateral: Secondary | ICD-10-CM | POA: Diagnosis not present

## 2016-08-09 DIAGNOSIS — Z23 Encounter for immunization: Secondary | ICD-10-CM | POA: Diagnosis not present

## 2016-09-11 DIAGNOSIS — I351 Nonrheumatic aortic (valve) insufficiency: Secondary | ICD-10-CM | POA: Diagnosis not present

## 2016-09-11 DIAGNOSIS — I2510 Atherosclerotic heart disease of native coronary artery without angina pectoris: Secondary | ICD-10-CM | POA: Diagnosis not present

## 2016-09-11 DIAGNOSIS — I10 Essential (primary) hypertension: Secondary | ICD-10-CM | POA: Diagnosis not present

## 2016-09-11 DIAGNOSIS — E785 Hyperlipidemia, unspecified: Secondary | ICD-10-CM | POA: Diagnosis not present

## 2016-09-12 ENCOUNTER — Other Ambulatory Visit: Payer: Self-pay | Admitting: Family Medicine

## 2016-09-12 DIAGNOSIS — I714 Abdominal aortic aneurysm, without rupture, unspecified: Secondary | ICD-10-CM

## 2016-09-15 DIAGNOSIS — H2513 Age-related nuclear cataract, bilateral: Secondary | ICD-10-CM | POA: Diagnosis not present

## 2016-09-18 ENCOUNTER — Ambulatory Visit
Admission: RE | Admit: 2016-09-18 | Discharge: 2016-09-18 | Disposition: A | Discharge status: Home / Self Care | Payer: Medicare Other | Source: Ambulatory Visit | Attending: Family Medicine | Admitting: Family Medicine

## 2016-09-18 DIAGNOSIS — I714 Abdominal aortic aneurysm, without rupture, unspecified: Secondary | ICD-10-CM

## 2016-09-18 DIAGNOSIS — N281 Cyst of kidney, acquired: Secondary | ICD-10-CM | POA: Insufficient documentation

## 2016-09-19 ENCOUNTER — Telehealth: Payer: Self-pay | Admitting: Family Medicine

## 2016-09-19 DIAGNOSIS — I714 Abdominal aortic aneurysm, without rupture, unspecified: Secondary | ICD-10-CM

## 2016-09-19 NOTE — Telephone Encounter (Signed)
Called and LMOM for them to call back regarding results of [place].   AAA looks stable. No change. OK to recheck it in 2 years. He also has a small cyst in his kidney- no change from before. They are not worried about it.   OK to give him this message if he calls. Thanks!

## 2016-09-21 ENCOUNTER — Encounter
Admission: RE | Disposition: A | Discharge status: Home / Self Care | Payer: Self-pay | Source: Ambulatory Visit | Attending: Ophthalmology

## 2016-09-21 ENCOUNTER — Encounter: Payer: Self-pay | Admitting: Anesthesiology

## 2016-09-21 ENCOUNTER — Ambulatory Visit: Payer: Medicare Other | Admitting: Anesthesiology

## 2016-09-21 ENCOUNTER — Ambulatory Visit
Admission: RE | Admit: 2016-09-21 | Discharge: 2016-09-21 | Disposition: A | Discharge status: Home / Self Care | Payer: Medicare Other | Source: Ambulatory Visit | Attending: Ophthalmology | Admitting: Ophthalmology

## 2016-09-21 DIAGNOSIS — Z955 Presence of coronary angioplasty implant and graft: Secondary | ICD-10-CM | POA: Diagnosis not present

## 2016-09-21 DIAGNOSIS — I252 Old myocardial infarction: Secondary | ICD-10-CM | POA: Diagnosis not present

## 2016-09-21 DIAGNOSIS — Z7902 Long term (current) use of antithrombotics/antiplatelets: Secondary | ICD-10-CM | POA: Diagnosis not present

## 2016-09-21 DIAGNOSIS — Z79899 Other long term (current) drug therapy: Secondary | ICD-10-CM | POA: Diagnosis not present

## 2016-09-21 DIAGNOSIS — I10 Essential (primary) hypertension: Secondary | ICD-10-CM | POA: Insufficient documentation

## 2016-09-21 DIAGNOSIS — I2510 Atherosclerotic heart disease of native coronary artery without angina pectoris: Secondary | ICD-10-CM | POA: Insufficient documentation

## 2016-09-21 DIAGNOSIS — H2511 Age-related nuclear cataract, right eye: Secondary | ICD-10-CM | POA: Diagnosis not present

## 2016-09-21 DIAGNOSIS — I739 Peripheral vascular disease, unspecified: Secondary | ICD-10-CM | POA: Diagnosis not present

## 2016-09-21 DIAGNOSIS — H2513 Age-related nuclear cataract, bilateral: Secondary | ICD-10-CM | POA: Diagnosis not present

## 2016-09-21 DIAGNOSIS — F17200 Nicotine dependence, unspecified, uncomplicated: Secondary | ICD-10-CM | POA: Diagnosis not present

## 2016-09-21 DIAGNOSIS — E7800 Pure hypercholesterolemia, unspecified: Secondary | ICD-10-CM | POA: Diagnosis not present

## 2016-09-21 DIAGNOSIS — M1990 Unspecified osteoarthritis, unspecified site: Secondary | ICD-10-CM | POA: Diagnosis not present

## 2016-09-21 HISTORY — PX: CATARACT EXTRACTION W/PHACO: SHX586

## 2016-09-21 HISTORY — DX: Unspecified hearing loss, unspecified ear: H91.90

## 2016-09-21 HISTORY — DX: Gastro-esophageal reflux disease without esophagitis: K21.9

## 2016-09-21 HISTORY — DX: Aortic aneurysm of unspecified site, without rupture: I71.9

## 2016-09-21 SURGERY — PHACOEMULSIFICATION, CATARACT, WITH IOL INSERTION
Anesthesia: Monitor Anesthesia Care | Site: Eye | Laterality: Right | Wound class: Clean

## 2016-09-21 MED ORDER — PHENYLEPHRINE HCL 10 % OP SOLN
1.0000 [drp] | OPHTHALMIC | Status: AC
  Administered 2016-09-21 (×4): 1 [drp] via OPHTHALMIC

## 2016-09-21 MED ORDER — POVIDONE-IODINE 5 % OP SOLN
OPHTHALMIC | Status: AC
  Filled 2016-09-21: qty 30

## 2016-09-21 MED ORDER — SODIUM HYALURONATE 23 MG/ML IO SOLN
INTRAOCULAR | Status: AC
  Filled 2016-09-21: qty 0.6

## 2016-09-21 MED ORDER — MOXIFLOXACIN HCL 0.5 % OP SOLN: 1.0000 [drp] | OPHTHALMIC | Status: DC | PRN

## 2016-09-21 MED ORDER — LIDOCAINE HCL (PF) 4 % IJ SOLN
INTRAMUSCULAR | Status: AC
  Filled 2016-09-21: qty 5

## 2016-09-21 MED ORDER — MOXIFLOXACIN HCL 0.5 % OP SOLN
1.0000 [drp] | OPHTHALMIC | Status: AC
  Administered 2016-09-21 (×3): 1 [drp] via OPHTHALMIC
  Filled 2016-09-21: qty 3

## 2016-09-21 MED ORDER — ARMC OPHTHALMIC DILATING DROPS
1.0000 | OPHTHALMIC | Status: DC | PRN
Start: 2016-09-21 — End: 2016-09-21

## 2016-09-21 MED ORDER — EPINEPHRINE PF 1 MG/ML IJ SOLN
INTRAOCULAR | Status: DC | PRN
  Administered 2016-09-21: 1 mL via OPHTHALMIC

## 2016-09-21 MED ORDER — SODIUM CHLORIDE 0.9 % IV SOLN
INTRAVENOUS | Status: DC
  Administered 2016-09-21: 08:00:00 via INTRAVENOUS

## 2016-09-21 MED ORDER — CYCLOPENTOLATE HCL 2 % OP SOLN
1.0000 [drp] | OPHTHALMIC | Status: AC
  Administered 2016-09-21 (×4): 1 [drp] via OPHTHALMIC

## 2016-09-21 MED ORDER — SODIUM HYALURONATE 10 MG/ML IO SOLN
INTRAOCULAR | Status: AC
  Filled 2016-09-21: qty 0.85

## 2016-09-21 MED ORDER — CYCLOPENTOLATE HCL 2 % OP SOLN
OPHTHALMIC | Status: AC
  Administered 2016-09-21: 1 [drp] via OPHTHALMIC
  Filled 2016-09-21: qty 2

## 2016-09-21 MED ORDER — PHENYLEPHRINE HCL 10 % OP SOLN
OPHTHALMIC | Status: AC
  Administered 2016-09-21: 1 [drp] via OPHTHALMIC
  Filled 2016-09-21: qty 5

## 2016-09-21 MED ORDER — MOXIFLOXACIN HCL 0.5 % OP SOLN
OPHTHALMIC | Status: DC | PRN
  Administered 2016-09-21: 4 [drp] via OPHTHALMIC

## 2016-09-21 MED ORDER — EPINEPHRINE PF 1 MG/ML IJ SOLN
INTRAMUSCULAR | Status: AC
  Filled 2016-09-21: qty 2

## 2016-09-21 MED ORDER — CARBACHOL 0.01 % IO SOLN
INTRAOCULAR | Status: DC | PRN
  Administered 2016-09-21: 0.5 mL via INTRAOCULAR

## 2016-09-21 MED ORDER — MIDAZOLAM HCL 2 MG/2ML IJ SOLN
INTRAMUSCULAR | Status: DC | PRN
  Administered 2016-09-21 (×2): .5 mg via INTRAVENOUS

## 2016-09-21 MED ORDER — SODIUM HYALURONATE 10 MG/ML IO SOLN
INTRAOCULAR | Status: DC | PRN
  Administered 2016-09-21: 0.85 mL via INTRAOCULAR

## 2016-09-21 MED ORDER — LIDOCAINE HCL (PF) 4 % IJ SOLN
INTRAMUSCULAR | Status: DC | PRN
  Administered 2016-09-21: 4 mL via OPHTHALMIC

## 2016-09-21 MED ORDER — SODIUM HYALURONATE 23 MG/ML IO SOLN
INTRAOCULAR | Status: DC | PRN
  Administered 2016-09-21: 0.6 mL via INTRAOCULAR

## 2016-09-21 SURGICAL SUPPLY — 23 items
CANNULA ANT/CHMB 27GA (MISCELLANEOUS) ×4 IMPLANT
CUP MEDICINE 2OZ PLAST GRAD ST (MISCELLANEOUS) ×2 IMPLANT
DISSECTOR HYDRO NUCLEUS 50X22 (MISCELLANEOUS) ×4 IMPLANT
GLOVE BIO SURGEON STRL SZ8 (GLOVE) ×2 IMPLANT
GLOVE BIOGEL M 6.5 STRL (GLOVE) ×2 IMPLANT
GLOVE SURG LX 7.5 STRW (GLOVE) ×1
GLOVE SURG LX STRL 7.5 STRW (GLOVE) ×1 IMPLANT
GOWN STRL REUS W/ TWL LRG LVL3 (GOWN DISPOSABLE) ×2 IMPLANT
GOWN STRL REUS W/TWL LRG LVL3 (GOWN DISPOSABLE) ×2
LENS IOL TECNIS ITEC 21.5 (Intraocular Lens) ×2 IMPLANT
NEEDLE CAPSULORHEX 25GA (NEEDLE) ×2 IMPLANT
PACK CATARACT (MISCELLANEOUS) ×2 IMPLANT
PACK CATARACT BRASINGTON LX (MISCELLANEOUS) ×2 IMPLANT
PACK EYE AFTER SURG (MISCELLANEOUS) ×2 IMPLANT
SOL BSS BAG (MISCELLANEOUS) ×2
SOL PREP PVP 2OZ (MISCELLANEOUS) ×2
SOLUTION BSS BAG (MISCELLANEOUS) ×1 IMPLANT
SOLUTION PREP PVP 2OZ (MISCELLANEOUS) ×1 IMPLANT
SYR 3ML LL SCALE MARK (SYRINGE) ×4 IMPLANT
SYR 5ML LL (SYRINGE) ×2 IMPLANT
SYR TB 1ML 27GX1/2 LL (SYRINGE) ×2 IMPLANT
WATER STERILE IRR 250ML POUR (IV SOLUTION) ×2 IMPLANT
WIPE NON LINTING 3.25X3.25 (MISCELLANEOUS) ×2 IMPLANT

## 2016-09-21 NOTE — Discharge Instructions (Signed)
Eye Surgery Discharge Instructions  Expect mild scratchy sensation or mild soreness. DO NOT RUB YOUR EYE!  The day of surgery:  Minimal physical activity, but bed rest is not required  No reading, computer work, or close hand work  No bending, lifting, or straining.  May watch TV  For 24 hours:  No driving, legal decisions, or alcoholic beverages  Safety precautions  Eat anything you prefer: It is better to start with liquids, then soup then solid foods.  _____ Eye patch should be worn until postoperative exam tomorrow.  ____ Solar shield eyeglasses should be worn for comfort in the sunlight/patch while sleeping  Resume all regular medications including aspirin or Coumadin if these were discontinued prior to surgery. You may shower, bathe, shave, or wash your hair. Tylenol may be taken for mild discomfort.  Call your doctor if you experience significant pain, nausea, or vomiting, fever > 101 or other signs of infection. [phone] or [phone] Specific instructions:  Follow-up Information    Benay Pillow, MD Follow up.   Specialty:  Ophthalmology Why:  November 10 9:30am Contact information: [address] [place] [address] [phone]

## 2016-09-21 NOTE — Transfer of Care (Signed)
Immediate Anesthesia Transfer of Care Note  Patient: Brian [name]  Procedure(s) Performed: Procedure(s) with comments: CATARACT EXTRACTION PHACO AND INTRAOCULAR LENS PLACEMENT (IOC) (Right) - [place] 1.10 AP% 13.8 CDE 9.93 Fluid pack lot # [id number]  Patient Location: PACU  Anesthesia Type:MAC  Level of Consciousness: awake, alert  and oriented  Airway & Oxygen Therapy: Patient Spontanous Breathing  Post-op Assessment: Report given to RN and Post -op Vital signs reviewed and stable  Post vital signs: Reviewed and stable  Last Vitals:  Vitals:   09/21/16 0725 09/21/16 0913  BP: 122/74 127/80  Pulse: 60 (!) 55  Resp: 14 12  Temp: 36.4 C 36.6 C    Last Pain:  Vitals:   09/21/16 0913  TempSrc: Temporal  PainSc: 0-No pain      Patients Stated Pain Goal: 0 ([id number])  Complications: No apparent anesthesia complications

## 2016-09-21 NOTE — Anesthesia Postprocedure Evaluation (Signed)
Anesthesia Post Note  Patient: VEDH BRITO  Procedure(s) Performed: Procedure(s) (LRB): CATARACT EXTRACTION PHACO AND INTRAOCULAR LENS PLACEMENT (IOC) (Right)  Patient location during evaluation: PACU Anesthesia Type: MAC Level of consciousness: awake and alert and oriented Pain management: pain level controlled Vital Signs Assessment: post-procedure vital signs reviewed and stable Respiratory status: spontaneous breathing Cardiovascular status: blood pressure returned to baseline and stable Postop Assessment: no headache, no backache, patient able to bend at knees, no signs of nausea or vomiting and adequate PO intake Anesthetic complications: no    Last Vitals:  Vitals:   09/21/16 0725  BP: 122/74  Pulse: 60  Resp: 14  Temp: 36.4 C    Last Pain:  Vitals:   09/21/16 0725  TempSrc: Tympanic  PainSc: 0-No pain                 Atiana Levier Lorenza Chick

## 2016-09-21 NOTE — H&P (Signed)
The History and Physical notes are on paper, have been signed, and are to be scanned. The patient remains stable and unchanged from the H&P.   Previous H&P reviewed, patient examined, and there are no changes.  Benay Pillow 09/21/2016 8:35 AM

## 2016-09-21 NOTE — Op Note (Signed)
OPERATIVE NOTE  Brian [name]:[id number] 09/21/2016   PREOPERATIVE DIAGNOSIS:  Nuclear sclerotic cataract right eye.  H25.11   POSTOPERATIVE DIAGNOSIS:    Nuclear sclerotic cataract right eye.     PROCEDURE:  Phacoemusification with posterior chamber intraocular lens placement of the right eye   LENS:   Implant Name Type Inv. Item Serial No. Manufacturer Lot No. LRB No. Used  LENS IOL DIOP 21.5 - [id number] Intraocular Lens LENS IOL DIOP 21.5 [id number]   Right 1       PCB00 +21.5   ULTRASOUND TIME: 1 minutes 10 seconds.  CDE 9.93   SURGEON:  Benay Pillow, MD, MPH  ANESTHESIOLOGIST: Anesthesiologist: Gunnar Bulla, MD CRNA: Hedda Slade, CRNA   ANESTHESIA:  Topical with tetracaine drops augmented with 1% preservative-free intracameral lidocaine.  ESTIMATED BLOOD LOSS: less than 1 mL.   COMPLICATIONS:  None.   DESCRIPTION OF PROCEDURE:  The patient was identified in the holding room and transported to the operating room and placed in the supine position under the operating microscope.  The right eye was identified as the operative eye and it was prepped and draped in the usual sterile ophthalmic fashion.   A 1.0 millimeter clear-corneal paracentesis was made at the 10:30 position. 0.5 ml of preservative-free 1% lidocaine with epinephrine was injected into the anterior chamber.  The anterior chamber was filled with Healon 5 viscoelastic.  A 2.4 millimeter keratome was used to make a near-clear corneal incision at the 8:00 position.  A curvilinear capsulorrhexis was made with a cystotome and capsulorrhexis forceps.  Balanced salt solution was used to hydrodissect and hydrodelineate the nucleus.   Phacoemulsification was then used in stop and chop fashion to remove the lens nucleus and epinucleus.  The remaining cortex was then removed using the irrigation and aspiration handpiece. Healon was then placed into the capsular bag to distend it for lens placement.  A lens was then  injected into the capsular bag.  The remaining viscoelastic was aspirated.   Wounds were hydrated with balanced salt solution.  The anterior chamber was inflated to a physiologic pressure with balanced salt solution.    Intracameral vigamox 0.1 mL undiluted was injected into the eye.  Good routine case.  No wound leaks were noted.  Topical Vigamox drops were applied to the eye.  The patient was taken to the recovery room in stable condition without complications of anesthesia or surgery  Benay Pillow 09/21/2016, 9:09 AM

## 2016-09-21 NOTE — Anesthesia Preprocedure Evaluation (Signed)
Anesthesia Evaluation  Patient identified by MRN, date of birth, ID band Patient awake    Reviewed: Allergy & Precautions, NPO status , Patient's Chart, lab work & pertinent test results, reviewed documented beta blocker date and time   Airway Mallampati: II  TM Distance: >3 FB     Dental  (+) Chipped   Pulmonary Current Smoker,           Cardiovascular hypertension, Pt. on medications and Pt. on home beta blockers + CAD, + Past MI and + Peripheral Vascular Disease       Neuro/Psych PSYCHIATRIC DISORDERS Anxiety    GI/Hepatic GERD  Controlled,  Endo/Other    Renal/GU Renal disease     Musculoskeletal  (+) Arthritis ,   Abdominal   Peds  Hematology   Anesthesia Other Findings   Reproductive/Obstetrics                             Anesthesia Physical Anesthesia Plan  ASA: III  Anesthesia Plan: MAC   Post-op Pain Management:    Induction:   Airway Management Planned:   Additional Equipment:   Intra-op Plan:   Post-operative Plan:   Informed Consent: I have reviewed the patients History and Physical, chart, labs and discussed the procedure including the risks, benefits and alternatives for the proposed anesthesia with the patient or authorized representative who has indicated his/her understanding and acceptance.     Plan Discussed with: CRNA  Anesthesia Plan Comments:         Anesthesia Quick Evaluation

## 2016-09-28 DIAGNOSIS — I2510 Atherosclerotic heart disease of native coronary artery without angina pectoris: Secondary | ICD-10-CM | POA: Diagnosis not present

## 2016-09-28 DIAGNOSIS — I351 Nonrheumatic aortic (valve) insufficiency: Secondary | ICD-10-CM | POA: Diagnosis not present

## 2016-10-28 ENCOUNTER — Inpatient Hospital Stay
Admission: EM | Admit: 2016-10-28 | Discharge: 2016-10-29 | DRG: 378 | Disposition: A | Discharge status: Home / Self Care | Payer: Medicare Other | Attending: Specialist | Admitting: Specialist

## 2016-10-28 ENCOUNTER — Encounter: Payer: Self-pay | Admitting: Emergency Medicine

## 2016-10-28 DIAGNOSIS — Z8249 Family history of ischemic heart disease and other diseases of the circulatory system: Secondary | ICD-10-CM | POA: Diagnosis not present

## 2016-10-28 DIAGNOSIS — Z86010 Personal history of colonic polyps: Secondary | ICD-10-CM | POA: Diagnosis not present

## 2016-10-28 DIAGNOSIS — F419 Anxiety disorder, unspecified: Secondary | ICD-10-CM | POA: Diagnosis present

## 2016-10-28 DIAGNOSIS — I714 Abdominal aortic aneurysm, without rupture: Secondary | ICD-10-CM | POA: Diagnosis present

## 2016-10-28 DIAGNOSIS — F418 Other specified anxiety disorders: Secondary | ICD-10-CM | POA: Diagnosis not present

## 2016-10-28 DIAGNOSIS — I5022 Chronic systolic (congestive) heart failure: Secondary | ICD-10-CM | POA: Diagnosis present

## 2016-10-28 DIAGNOSIS — F17210 Nicotine dependence, cigarettes, uncomplicated: Secondary | ICD-10-CM | POA: Diagnosis present

## 2016-10-28 DIAGNOSIS — I5020 Unspecified systolic (congestive) heart failure: Secondary | ICD-10-CM | POA: Diagnosis present

## 2016-10-28 DIAGNOSIS — Z7982 Long term (current) use of aspirin: Secondary | ICD-10-CM

## 2016-10-28 DIAGNOSIS — K219 Gastro-esophageal reflux disease without esophagitis: Secondary | ICD-10-CM | POA: Diagnosis not present

## 2016-10-28 DIAGNOSIS — I10 Essential (primary) hypertension: Secondary | ICD-10-CM | POA: Diagnosis not present

## 2016-10-28 DIAGNOSIS — I110 Hypertensive heart disease with heart failure: Secondary | ICD-10-CM | POA: Diagnosis present

## 2016-10-28 DIAGNOSIS — K921 Melena: Principal | ICD-10-CM | POA: Diagnosis present

## 2016-10-28 DIAGNOSIS — Z79899 Other long term (current) drug therapy: Secondary | ICD-10-CM

## 2016-10-28 DIAGNOSIS — I252 Old myocardial infarction: Secondary | ICD-10-CM | POA: Diagnosis not present

## 2016-10-28 DIAGNOSIS — Z955 Presence of coronary angioplasty implant and graft: Secondary | ICD-10-CM | POA: Diagnosis not present

## 2016-10-28 DIAGNOSIS — H9190 Unspecified hearing loss, unspecified ear: Secondary | ICD-10-CM | POA: Diagnosis present

## 2016-10-28 DIAGNOSIS — F32A Depression, unspecified: Secondary | ICD-10-CM | POA: Diagnosis present

## 2016-10-28 DIAGNOSIS — K922 Gastrointestinal hemorrhage, unspecified: Secondary | ICD-10-CM

## 2016-10-28 DIAGNOSIS — I509 Heart failure, unspecified: Secondary | ICD-10-CM | POA: Diagnosis not present

## 2016-10-28 DIAGNOSIS — F329 Major depressive disorder, single episode, unspecified: Secondary | ICD-10-CM | POA: Diagnosis present

## 2016-10-28 DIAGNOSIS — Z87442 Personal history of urinary calculi: Secondary | ICD-10-CM | POA: Diagnosis not present

## 2016-10-28 DIAGNOSIS — I2510 Atherosclerotic heart disease of native coronary artery without angina pectoris: Secondary | ICD-10-CM | POA: Diagnosis present

## 2016-10-28 DIAGNOSIS — I129 Hypertensive chronic kidney disease with stage 1 through stage 4 chronic kidney disease, or unspecified chronic kidney disease: Secondary | ICD-10-CM | POA: Diagnosis present

## 2016-10-28 DIAGNOSIS — R531 Weakness: Secondary | ICD-10-CM

## 2016-10-28 DIAGNOSIS — E785 Hyperlipidemia, unspecified: Secondary | ICD-10-CM | POA: Diagnosis not present

## 2016-10-28 DIAGNOSIS — E7800 Pure hypercholesterolemia, unspecified: Secondary | ICD-10-CM | POA: Diagnosis present

## 2016-10-28 DIAGNOSIS — Z7902 Long term (current) use of antithrombotics/antiplatelets: Secondary | ICD-10-CM | POA: Diagnosis not present

## 2016-10-28 DIAGNOSIS — K625 Hemorrhage of anus and rectum: Secondary | ICD-10-CM | POA: Diagnosis present

## 2016-10-28 HISTORY — DX: Unspecified systolic (congestive) heart failure: I50.20

## 2016-10-28 LAB — CBC
HCT: 44.8 % (ref 40.0–52.0)
Hemoglobin: 15.3 g/dL (ref 13.0–18.0)
MCH: 33.2 pg (ref 26.0–34.0)
MCHC: 34.2 g/dL (ref 32.0–36.0)
MCV: 97.1 fL (ref 80.0–100.0)
PLATELETS: 156 10*3/uL (ref 150–440)
RBC: 4.61 MIL/uL (ref 4.40–5.90)
RDW: 13 % (ref 11.5–14.5)
WBC: 9.1 10*3/uL (ref 3.8–10.6)

## 2016-10-28 LAB — COMPREHENSIVE METABOLIC PANEL
ALT: 19 U/L (ref 17–63)
AST: 20 U/L (ref 15–41)
Albumin: 4.1 g/dL (ref 3.5–5.0)
Alkaline Phosphatase: 70 U/L (ref 38–126)
Anion gap: 5 (ref 5–15)
BUN: 13 mg/dL (ref 6–20)
CHLORIDE: 108 mmol/L (ref 101–111)
CO2: 28 mmol/L (ref 22–32)
CREATININE: 0.68 mg/dL (ref 0.61–1.24)
Calcium: 9.4 mg/dL (ref 8.9–10.3)
GFR calc Af Amer: >60 mL/min (ref >60)
Glucose, Bld: 109 mg/dL — ABNORMAL HIGH (ref 65–99)
Potassium: 4.2 mmol/L (ref 3.5–5.1)
Sodium: 141 mmol/L (ref 135–145)
Total Bilirubin: 0.7 mg/dL (ref 0.3–1.2)
Total Protein: 7.2 g/dL (ref 6.5–8.1)

## 2016-10-28 LAB — TROPONIN I: Troponin I: <0.03 ng/mL (ref <0.03)

## 2016-10-28 LAB — TYPE AND SCREEN
ABO/RH(D): A POS
Antibody Screen: NEGATIVE

## 2016-10-28 LAB — PROTIME-INR
INR: 1
Prothrombin Time: 13.2 seconds (ref 11.4–15.2)

## 2016-10-28 MED ORDER — PANTOPRAZOLE SODIUM 40 MG IV SOLR
40.0000 mg | Freq: Once | INTRAVENOUS | Status: AC
  Administered 2016-10-28: 40 mg via INTRAVENOUS
  Filled 2016-10-28: qty 40

## 2016-10-28 NOTE — ED Provider Notes (Signed)
[facility] Emergency Department Provider Note    None    (approximate)  I have reviewed the triage vital signs and the nursing notes.   HISTORY  Chief Complaint Rectal Bleeding    HPI Brian [name] is a 82 y.o. male on aspirin and Plavix presents with 1 day of bright red blood Amer include blood per rectum with loose stools. He denies any abdominal pain. Does have a history of epigastric discomfort and reflux. States he also has a history of diverticular disease as well as colon polyps. Denies any recent fevers. Denies any back pain. No history of cirrhosis.  Complaining of generalized weakness today.   Past Medical History:  Diagnosis Date  . AAA (abdominal aortic aneurysm) (HCC)    2.9 CM infrarenal AAA on CT 11/2011  . Alcoholism ([place])   . Anxiety    panic attacks, rare.  . Aortic aneurysm (HCC)    small  . Arthritis   . Coronary artery disease    2 stents in july 2015  . Depression with anxiety   . Diverticulosis of colon 10/2009  . 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   . Gastritis 03/2012  . GERD (gastroesophageal reflux disease)   . GI bleed   . HOH (hard of hearing)   . Hyperlipemia   . Hypertension   . Hypovolemic shock ([place])   . Kidney stones   . Myocardial infarction 2015  . Positive H. pylori titer ?, before 2010   treated with Abx.   . Tubular adenoma of colon    Family History  Problem Relation Age of Onset  . Ulcers Father     stomach   . Heart disease Father   . Heart disease Mother   . Anuerysm Brother   . Hypertension Daughter   . Heart disease Paternal Grandmother   . Heart attack Paternal Grandmother   . Heart disease Paternal Grandfather   . Colon cancer Neg Hx    Past Surgical History:  Procedure Laterality Date  . CARDIAC CATHETERIZATION  2015   Stents  . CATARACT EXTRACTION W/PHACO Right 09/21/2016   Procedure: CATARACT EXTRACTION PHACO AND INTRAOCULAR LENS PLACEMENT (IOC);  Surgeon: Eulogio Bear, MD;  Location: [facility] ORS;  Service: Ophthalmology;  Laterality: Right;  [place] 1.10 AP% 13.8 CDE 9.93 Fluid pack lot # [id number]  . COLONOSCOPY WITH PROPOFOL Left 06/06/2015   Procedure: COLONOSCOPY WITH PROPOFOL;  Surgeon: Hulen Luster, MD;  Location: [facility] ENDOSCOPY;  Service: Endoscopy;  Laterality: Left;  . LEFT HEART CATHETERIZATION WITH CORONARY ANGIOGRAM Bilateral 05/29/2014   Procedure: LEFT HEART CATHETERIZATION WITH CORONARY ANGIOGRAM;  Surgeon: Laverda Page, MD;  Location: [facility] CATH LAB;  Service: Cardiovascular;  Laterality: Bilateral;  . PERCUTANEOUS CORONARY STENT INTERVENTION (PCI-S)  05/29/2014   Procedure: PERCUTANEOUS CORONARY STENT INTERVENTION (PCI-S);  Surgeon: Laverda Page, MD;  Location: [facility] CATH LAB;  Service: Cardiovascular;;  DES x2 Prox and Mid LAD    Patient Active Problem List   Diagnosis Date Noted  . Controlled substance agreement signed 01/20/2016  . Arthritis 01/14/2016  . AAA (abdominal aortic aneurysm) ([place]) 01/14/2016  . Malnutrition of moderate degree ([place]) 06/07/2015  . History of colonic polyps 05/14/2015  . Encounter for long-term (current) use of antiplatelets/antithrombotics 05/14/2015  . History of GI diverticular bleed 05/04/2015  . Acute anterior wall MI ([place]) 05/29/2014  . Dyspepsia 02/21/2012  . Hx of adenomatous colonic polyps 02/21/2012  . Anxiety and depression 02/21/2012  .  HTN (hypertension) 02/21/2012  . Hyperlipidemia 02/21/2012  . Renal stones 02/21/2012      Prior to Admission medications   Medication Sig Start Date End Date Taking? Authorizing Provider  aspirin 81 MG chewable tablet Chew 81 mg by mouth daily.    Historical Provider, MD  atorvastatin (LIPITOR) 80 MG tablet Take 1 tablet (80 mg total) by mouth daily at 6 PM. Patient taking differently: Take 80 mg by mouth every evening.  06/02/14   Adrian Prows, MD  B Complex-C (B-COMPLEX WITH VITAMIN C) tablet Take 1 tablet by mouth daily.    Historical Provider, MD    clopidogrel (PLAVIX) 75 MG tablet Take 75 mg by mouth daily.  01/13/16   Historical Provider, MD  diazepam (VALIUM) 5 MG tablet Take 5 mg by mouth every 8 (eight) hours as needed (for anxiety).  05/31/16   Historical Provider, MD  lisinopril (PRINIVIL,ZESTRIL) 5 MG tablet Take 5 mg by mouth daily.  06/03/15   Historical Provider, MD  metoprolol succinate (TOPROL-XL) 25 MG 24 hr tablet Take 25 mg by mouth daily.  05/10/15   Historical Provider, MD  traMADol (ULTRAM) 50 MG tablet Take 1 tablet (50 mg total) by mouth every 4 (four) hours as needed. Patient taking differently: Take 50 mg by mouth every 4 (four) hours as needed (for pain).  03/15/16   Megan Annia Friendly, DO    Allergies Zoloft Diamond Nickel hcl]    Social History Social History  Substance Use Topics  . Smoking status: Current Some Day Smoker    Packs/day: 0.50    Years: 60.00    Types: Cigarettes  . Smokeless tobacco: Never Used  . Alcohol use No    Review of Systems Patient denies headaches, rhinorrhea, blurry vision, numbness, shortness of breath, chest pain, edema, cough, abdominal pain, nausea, vomiting, diarrhea, dysuria, fevers, rashes or hallucinations unless otherwise stated above in HPI. ____________________________________________   PHYSICAL EXAM:  VITAL SIGNS: Vitals:   10/28/16 1904  BP: (!) 135/98  Pulse: 83  Resp: 18  Temp: 97.8 F (36.6 C)    Constitutional: Alert and oriented. Well appearing and in no acute distress. Eyes: Conjunctivae are normal. PERRL. EOMI. Head: Atraumatic. Nose: No congestion/rhinnorhea. Mouth/Throat: Mucous membranes are moist.  Oropharynx non-erythematous. Neck: No stridor. Painless ROM. No cervical spine tenderness to palpation Hematological/Lymphatic/Immunilogical: No cervical lymphadenopathy. Cardiovascular: Normal rate, regular rhythm. Grossly normal heart sounds.  Good peripheral circulation. Respiratory: Normal respiratory effort.  No retractions. Lungs  CTAB. Gastrointestinal: Soft and nontender. No distention. No abdominal bruits. No CVA tenderness. Genitourinary: +guaiac stools Musculoskeletal: No lower extremity tenderness nor edema.  No joint effusions. Neurologic:  Normal speech and language. No gross focal neurologic deficits are appreciated. No gait instability. Skin:  Skin is warm, dry and intact. No rash noted. Psychiatric: Mood and affect are normal. Speech and behavior are normal.  ____________________________________________   LABS (all labs ordered are listed, but only abnormal results are displayed)  Results for orders placed or performed during the hospital encounter of 10/28/16 (from the past 24 hour(s))  Comprehensive metabolic panel     Status: Abnormal   Collection Time: 10/28/16  7:22 PM  Result Value Ref Range   Sodium 141 135 - 145 mmol/L   Potassium 4.2 3.5 - 5.1 mmol/L   Chloride 108 101 - 111 mmol/L   CO2 28 22 - 32 mmol/L   Glucose, Bld 109 (H) 65 - 99 mg/dL   BUN 13 6 - 20 mg/dL   Creatinine, Ser  0.68 0.61 - 1.24 mg/dL   Calcium 9.4 8.9 - 10.3 mg/dL   Total Protein 7.2 6.5 - 8.1 g/dL   Albumin 4.1 3.5 - 5.0 g/dL   AST 20 15 - 41 U/L   ALT 19 17 - 63 U/L   Alkaline Phosphatase 70 38 - 126 U/L   Total Bilirubin 0.7 0.3 - 1.2 mg/dL   GFR calc non Af Amer >60 >60 mL/min   GFR calc Af Amer >60 >60 mL/min   Anion gap 5 5 - 15  CBC     Status: None   Collection Time: 10/28/16  7:22 PM  Result Value Ref Range   WBC 9.1 3.8 - 10.6 K/uL   RBC 4.61 4.40 - 5.90 MIL/uL   Hemoglobin 15.3 13.0 - 18.0 g/dL   HCT 44.8 40.0 - 52.0 %   MCV 97.1 80.0 - 100.0 fL   MCH 33.2 26.0 - 34.0 pg   MCHC 34.2 32.0 - 36.0 g/dL   RDW 13.0 11.5 - 14.5 %   Platelets 156 150 - 440 K/uL  Type and screen Brian [name]     Status: None   Collection Time: 10/28/16  7:22 PM  Result Value Ref Range   ABO/RH(D) A POS    Antibody Screen NEG    Sample Expiration 10/31/2016     ____________________________________________  EKG My review and personal interpretation at Time:   23:24 Indication: weakness  Rate: 80  Rhythm: sinus Axis: normal Other: no acute st elevations, non specific changes, normal intervals ____________________________________________  RADIOLOGY   ____________________________________________   PROCEDURES  Procedure(s) performed: none Procedures    Critical Care performed: no ____________________________________________   INITIAL IMPRESSION / ASSESSMENT AND PLAN / ED COURSE  Pertinent labs & imaging results that were available during my care of the patient were reviewed by me and considered in my medical decision making (see chart for details).  DDX: UGI bleed, diverticulosis, hemorrhoids, AAA  KEL AMADON is a 82 y.o. who presents to the ED with bright red blood per rectum. Patient afebrile hemodynamically stable but does describe weakness. Blood work ordered in triage for his above complaints shows stable hemoglobin. INR is normal. No history of cirrhosis therefore feel less consistent with variceal bleed. Does have history of reflux and diverticulosis therefore etiology is uncertain but the patient has no pain on exam therefore do not feel emergent CT imaging clinically indicated.  The patient will be placed on continuous pulse oximetry and telemetry for monitoring.  Laboratory evaluation will be sent to evaluate for the above complaints.     Clinical Course as of Oct 29 38  Sat Oct 28, 2016  2242 Rectal exam shows evidence of maroon colored stool. Patient is complaining of dizziness and weakness though his hemoglobin is stable and he is normotensive. I do not feel transfusion clinically indicated at this time. Spoke with Dr. Jannifer Franklin regarding the patient's presentation do feel the patient should be observed in the hospital for serial H&H and GI consultation in the morning given his high risk given his underlying cardiac  disease and anti-coagulation on aspirin and Plavix.  Have discussed with the patient and available family all diagnostics and treatments performed thus far and all questions were answered to the best of my ability. The patient demonstrates understanding and agreement with plan.   [PR]    Clinical Course User Index [PR] Merlyn Lot, MD     ____________________________________________   FINAL CLINICAL IMPRESSION(S) / ED DIAGNOSES  Final diagnoses:  Acute GI bleeding  Hematochezia  Weakness      NEW MEDICATIONS STARTED DURING THIS VISIT:  New Prescriptions   No medications on file     Note:  This document was prepared using Dragon voice recognition software and may include unintentional dictation errors.    Merlyn Lot, MD 10/29/16 [phone]

## 2016-10-28 NOTE — H&P (Signed)
Brian [name] at [place] NAME: Brian [name]    MR#:  [id number]  DATE OF BIRTH:  February 13, 1933  DATE OF ADMISSION:  10/28/2016  PRIMARY CARE PHYSICIAN: Park Liter, DO   REQUESTING/REFERRING PHYSICIAN: Quentin Cornwall, M.D.  CHIEF COMPLAINT:   Chief Complaint  Patient presents with  . Rectal Bleeding    HISTORY OF PRESENT ILLNESS:  Brian [name]  is a 82 y.o. male who presents with GI bleed. Patient states that throughout the day today his had 5 or 6 episodes of significant bright red bleeding with his stools. He began to have some symptoms such as lightheadedness and mild shortness of breath and fatigue. He does have a history of having a GI bleed in the past, this was in the setting of a colonoscopy with polyp resection. He has known CAD and has had stents in the past and is on aspirin and Plavix. Here in the ED tonight his hemoglobin is stable, he is hemodynamically stable. However, he did have some recurrent bloody stools even here. Hospitalists were called for admission and further evaluation and treatment.  PAST MEDICAL HISTORY:   Past Medical History:  Diagnosis Date  . AAA (abdominal aortic aneurysm) (HCC)    2.9 CM infrarenal AAA on CT 11/2011  . Alcoholism ([name])   . Anxiety    panic attacks, rare.  . Aortic aneurysm (HCC)    small  . Arthritis   . Coronary artery disease    2 stents in july 2015  . Depression with anxiety   . Diverticulosis of colon 10/2009  . 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   . Gastritis 03/2012  . GERD (gastroesophageal reflux disease)   . GI bleed   . HFrEF (heart failure with reduced ejection fraction) ([place])   . HOH (hard of hearing)   . Hyperlipemia   . Hypertension   . Hypovolemic shock ([place])   . Kidney stones   . Myocardial infarction 2015  . Positive H. pylori titer ?, before 2010   treated with Abx.   . Tubular adenoma of colon     PAST SURGICAL HISTORY:   Past Surgical  History:  Procedure Laterality Date  . CARDIAC CATHETERIZATION  2015   Stents  . CATARACT EXTRACTION W/PHACO Right 09/21/2016   Procedure: CATARACT EXTRACTION PHACO AND INTRAOCULAR LENS PLACEMENT (IOC);  Surgeon: Eulogio Bear, MD;  Location: [facility] ORS;  Service: Ophthalmology;  Laterality: Right;  [place] 1.10 AP% 13.8 CDE 9.93 Fluid pack lot # [id number]  . COLONOSCOPY WITH PROPOFOL Left 06/06/2015   Procedure: COLONOSCOPY WITH PROPOFOL;  Surgeon: Hulen Luster, MD;  Location: [facility] ENDOSCOPY;  Service: Endoscopy;  Laterality: Left;  . LEFT HEART CATHETERIZATION WITH CORONARY ANGIOGRAM Bilateral 05/29/2014   Procedure: LEFT HEART CATHETERIZATION WITH CORONARY ANGIOGRAM;  Surgeon: Laverda Page, MD;  Location: [facility] CATH LAB;  Service: Cardiovascular;  Laterality: Bilateral;  . PERCUTANEOUS CORONARY STENT INTERVENTION (PCI-S)  05/29/2014   Procedure: PERCUTANEOUS CORONARY STENT INTERVENTION (PCI-S);  Surgeon: Laverda Page, MD;  Location: [facility] CATH LAB;  Service: Cardiovascular;;  DES x2 Prox and Mid LAD     SOCIAL HISTORY:   Social History  Substance Use Topics  . Smoking status: Current Some Day Smoker    Packs/day: 0.50    Years: 60.00    Types: Cigarettes  . Smokeless tobacco: Never Used  . Alcohol use No    FAMILY HISTORY:   Family History  Problem Relation Age of  Onset  . Ulcers Father     stomach   . Heart disease Father   . Heart disease Mother   . Anuerysm Brother   . Hypertension Daughter   . Heart disease Paternal Grandmother   . Heart attack Paternal Grandmother   . Heart disease Paternal Grandfather   . Colon cancer Neg Hx     DRUG ALLERGIES:   Allergies  Allergen Reactions  . Zoloft [Sertraline Hcl] Nausea Only    Patient states that he was not himself when he took this drug    MEDICATIONS AT HOME:   Prior to Admission medications   Medication Sig Start Date End Date Taking? Authorizing Provider  aspirin 81 MG chewable tablet Chew 81 mg by mouth daily.    Yes Historical Provider, MD  atorvastatin (LIPITOR) 80 MG tablet Take 1 tablet (80 mg total) by mouth daily at 6 PM. Patient taking differently: Take 80 mg by mouth every evening.  06/02/14  Yes Adrian Prows, MD  clopidogrel (PLAVIX) 75 MG tablet Take 75 mg by mouth daily.  01/13/16  Yes Historical Provider, MD  diazepam (VALIUM) 5 MG tablet Take 5 mg by mouth every 8 (eight) hours as needed (for anxiety).  05/31/16  Yes Historical Provider, MD  lisinopril (PRINIVIL,ZESTRIL) 5 MG tablet Take 5 mg by mouth daily.  06/03/15  Yes Historical Provider, MD  metoprolol succinate (TOPROL-XL) 25 MG 24 hr tablet Take 25 mg by mouth daily.  05/10/15  Yes Historical Provider, MD  nitroGLYCERIN (NITROSTAT) 0.4 MG SL tablet Place 0.4 mg under the tongue every 5 (five) minutes x 3 doses as needed for chest pain. 09/11/16  Yes Historical Provider, MD  traMADol (ULTRAM) 50 MG tablet Take 1 tablet (50 mg total) by mouth every 4 (four) hours as needed. Patient taking differently: Take 50 mg by mouth every 4 (four) hours as needed (for pain).  03/15/16  Yes Megan Annia Friendly, DO    REVIEW OF SYSTEMS:  Review of Systems  Constitutional: Positive for malaise/fatigue. Negative for chills, fever and weight loss.  HENT: Negative for ear pain, hearing loss and tinnitus.   Eyes: Negative for blurred vision, double vision, pain and redness.  Respiratory: Positive for shortness of breath. Negative for cough and hemoptysis.   Cardiovascular: Negative for chest pain, palpitations, orthopnea and leg swelling.  Gastrointestinal: Positive for blood in stool. Negative for abdominal pain, constipation, diarrhea, nausea and vomiting.  Genitourinary: Negative for dysuria, frequency and hematuria.  Musculoskeletal: Negative for back pain, joint pain and neck pain.  Skin:       No acne, rash, or lesions  Neurological: Negative for dizziness, tremors, focal weakness and weakness.  Endo/Heme/Allergies: Negative for polydipsia. Does not  bruise/bleed easily.  Psychiatric/Behavioral: Negative for depression. The patient is not nervous/anxious and does not have insomnia.      VITAL SIGNS:   Vitals:   10/28/16 1904 10/28/16 1905 10/28/16 2200 10/28/16 2230  BP: (!) 135/98  (!) 137/94 115/71  Pulse: 83  83 77  Resp: 18     Temp: 97.8 F (36.6 C)     TempSrc: Oral     SpO2: 95%  96% 95%  Weight:  68.9 kg (152 lb)    Height:  5\' 8"  (1.727 m)     Wt Readings from Last 3 Encounters:  10/28/16 68.9 kg (152 lb)  09/21/16 69.9 kg (154 lb)  06/15/16 69.9 kg (154 lb)    PHYSICAL EXAMINATION:  Physical Exam  Vitals reviewed. Constitutional:  He is oriented to person, place, and time. He appears well-developed and well-nourished. No distress.  HENT:  Head: Normocephalic and atraumatic.  Mouth/Throat: Oropharynx is clear and moist.  Eyes: Conjunctivae and EOM are normal. Pupils are equal, round, and reactive to light. No scleral icterus.  Neck: Normal range of motion. Neck supple. No JVD present. No thyromegaly present.  Cardiovascular: Normal rate, regular rhythm and intact distal pulses.  Exam reveals no gallop and no friction rub.   No murmur heard. Respiratory: Effort normal and breath sounds normal. No respiratory distress. He has no wheezes. He has no rales.  GI: Soft. Bowel sounds are normal. He exhibits no distension. There is no tenderness.  Musculoskeletal: Normal range of motion. He exhibits no edema.  No arthritis, no gout  Lymphadenopathy:    He has no cervical adenopathy.  Neurological: He is alert and oriented to person, place, and time. No cranial nerve deficit.  No dysarthria, no aphasia  Skin: Skin is warm and dry. No rash noted. No erythema.  Psychiatric: He has a normal mood and affect. His behavior is normal. Judgment and thought content normal.    LABORATORY PANEL:   CBC  Recent Labs Lab 10/28/16 1922  WBC 9.1  HGB 15.3  HCT 44.8  PLT 156    ------------------------------------------------------------------------------------------------------------------  Chemistries   Recent Labs Lab 10/28/16 1922  NA 141  K 4.2  CL 108  CO2 28  GLUCOSE 109*  BUN 13  CREATININE 0.68  CALCIUM 9.4  AST 20  ALT 19  ALKPHOS 70  BILITOT 0.7   ------------------------------------------------------------------------------------------------------------------  Cardiac Enzymes  Recent Labs Lab 10/28/16 1922  TROPONINI <0.03   ------------------------------------------------------------------------------------------------------------------  RADIOLOGY:  No results found.  EKG:   Orders placed or performed during the hospital encounter of 10/28/16  . ED EKG  . ED EKG    IMPRESSION AND PLAN:  Principal Problem:   Hematochezia - suspect lower GI source, however we will have him on twice a day IV PPI for now. He states that this last time he had a bowel movement here in the ED the bleeding seemed to be less. We will keep him nothing by mouth tonight, use IV fluids for hydration, check hemoglobin every 6 hours, and get a GI consult Active Problems:   GERD (gastroesophageal reflux disease) - PPI as below   Anxiety and depression - continue home meds   HTN (hypertension) - stable, continue home meds   CAD (coronary artery disease) - stable, continue home meds. We'll continue aspirin and Plavix for now as well given that he has had 2 heart attacks in the past and his bleeding seems to be decreasing.   HFrEF (heart failure with reduced ejection fraction) (HCC) - does not seem to be an exacerbation currently, we will be gentle with IV fluids, we'll get an echocardiogram in the morning given that his last echo was several years ago.   Hyperlipidemia - continue home meds  All the records are reviewed and case discussed with ED provider. Management plans discussed with the patient and/or family.  DVT PROPHYLAXIS: Mechanical only  GI  PROPHYLAXIS: PPI  ADMISSION STATUS: Inpatient  CODE STATUS: Full Code Status History    Date Active Date Inactive Code Status Order ID Comments User Context   06/05/2015  9:07 PM 06/08/2015  3:10 PM Full Code [id number]  Vaughan Basta, MD Inpatient   06/05/2015  2:23 PM 06/05/2015  9:07 PM DNR [id number]  Vaughan Basta, MD ED   05/03/2015  8:10 PM 05/04/2015  5:57 PM DNR [id number]  Bettey Costa, MD Inpatient   05/29/2014 11:12 PM 06/02/2014  7:20 PM Full Code [id number]  Adrian Prows, MD Inpatient    Advance Directive Documentation   Flowsheet Row Most Recent Value  Type of Advance Directive  Healthcare Power of Attorney, Living will  Pre-existing out of facility DNR order (yellow form or pink MOST form)  No data  "MOST" Form in Place?  No data      TOTAL TIME TAKING CARE OF THIS PATIENT: 45 minutes.    Katalyna Socarras Archer 10/28/2016, 11:12 PM  [employer] [facility]  Office  [phone]  CC: Primary care physician; Park Liter, DO

## 2016-10-28 NOTE — ED Notes (Signed)
Assisted md with rectal exam and hemoccult.

## 2016-10-28 NOTE — ED Triage Notes (Signed)
Pt reports rectal bleeding x 1 day, states moderate amount, pt reports hx colon polyps.  Pt denies dizziness and weakness currently, but states some earlier today.

## 2016-10-28 NOTE — ED Notes (Signed)
Pt states rectal bleeding like strawberry jelly since this am. Pt states history of rectal bleeding in past, diverticula and colon polyps. Pt states he takes plavix 75mg  daily. Pt states was dizzy earlier in day but none currently. Skin normal color warm and dry. Pt with slow constant ooze of rectal bleeding. resps unlabored. 3+ radial pulse noted and cap refill approx 3 seconds.

## 2016-10-29 ENCOUNTER — Inpatient Hospital Stay (HOSPITAL_BASED_OUTPATIENT_CLINIC_OR_DEPARTMENT_OTHER)
Admit: 2016-10-29 | Discharge: 2016-10-29 | Disposition: A | Discharge status: Home / Self Care | Payer: Medicare Other | Attending: Internal Medicine | Admitting: Internal Medicine

## 2016-10-29 DIAGNOSIS — F329 Major depressive disorder, single episode, unspecified: Secondary | ICD-10-CM | POA: Diagnosis not present

## 2016-10-29 DIAGNOSIS — K921 Melena: Secondary | ICD-10-CM | POA: Diagnosis not present

## 2016-10-29 DIAGNOSIS — I509 Heart failure, unspecified: Secondary | ICD-10-CM | POA: Diagnosis not present

## 2016-10-29 DIAGNOSIS — I110 Hypertensive heart disease with heart failure: Secondary | ICD-10-CM | POA: Diagnosis not present

## 2016-10-29 DIAGNOSIS — I10 Essential (primary) hypertension: Secondary | ICD-10-CM | POA: Diagnosis not present

## 2016-10-29 DIAGNOSIS — K625 Hemorrhage of anus and rectum: Secondary | ICD-10-CM | POA: Diagnosis not present

## 2016-10-29 DIAGNOSIS — K219 Gastro-esophageal reflux disease without esophagitis: Secondary | ICD-10-CM | POA: Diagnosis not present

## 2016-10-29 DIAGNOSIS — F419 Anxiety disorder, unspecified: Secondary | ICD-10-CM | POA: Diagnosis not present

## 2016-10-29 DIAGNOSIS — I5022 Chronic systolic (congestive) heart failure: Secondary | ICD-10-CM | POA: Diagnosis not present

## 2016-10-29 DIAGNOSIS — F418 Other specified anxiety disorders: Secondary | ICD-10-CM | POA: Diagnosis not present

## 2016-10-29 LAB — ECHOCARDIOGRAM COMPLETE
Height: 68 in
Weight: 2473.6 oz

## 2016-10-29 LAB — CBC
HEMATOCRIT: 41.1 % (ref 40.0–52.0)
Hemoglobin: 14.2 g/dL (ref 13.0–18.0)
MCH: 33.2 pg (ref 26.0–34.0)
MCHC: 34.5 g/dL (ref 32.0–36.0)
MCV: 96.2 fL (ref 80.0–100.0)
Platelets: 145 10*3/uL — ABNORMAL LOW (ref 150–440)
RBC: 4.28 MIL/uL — ABNORMAL LOW (ref 4.40–5.90)
RDW: 13.5 % (ref 11.5–14.5)
WBC: 8.8 10*3/uL (ref 3.8–10.6)

## 2016-10-29 LAB — BASIC METABOLIC PANEL
Anion gap: 5 (ref 5–15)
BUN: 12 mg/dL (ref 6–20)
CHLORIDE: 110 mmol/L (ref 101–111)
CO2: 28 mmol/L (ref 22–32)
Calcium: 9.1 mg/dL (ref 8.9–10.3)
Creatinine, Ser: 0.75 mg/dL (ref 0.61–1.24)
GFR calc Af Amer: >60 mL/min (ref >60)
GFR calc non Af Amer: >60 mL/min (ref >60)
GLUCOSE: 99 mg/dL (ref 65–99)
POTASSIUM: 3.9 mmol/L (ref 3.5–5.1)
Sodium: 143 mmol/L (ref 135–145)

## 2016-10-29 LAB — HEMOGLOBIN
Hemoglobin: 14.2 g/dL (ref 13.0–18.0)
Hemoglobin: 14.2 g/dL (ref 13.0–18.0)

## 2016-10-29 MED ORDER — ACETAMINOPHEN 325 MG PO TABS: 650.0000 mg | ORAL_TABLET | Freq: Four times a day (QID) | ORAL | Status: DC | PRN

## 2016-10-29 MED ORDER — ATORVASTATIN CALCIUM 20 MG PO TABS: 80.0000 mg | ORAL_TABLET | Freq: Every evening | ORAL | Status: DC

## 2016-10-29 MED ORDER — METOPROLOL SUCCINATE ER 25 MG PO TB24
25.0000 mg | ORAL_TABLET | Freq: Every day | ORAL | Status: DC
  Administered 2016-10-29: 25 mg via ORAL
  Filled 2016-10-29: qty 1

## 2016-10-29 MED ORDER — ASPIRIN 81 MG PO CHEW: 81.0000 mg | CHEWABLE_TABLET | Freq: Every day | ORAL | Status: DC

## 2016-10-29 MED ORDER — LISINOPRIL 5 MG PO TABS
5.0000 mg | ORAL_TABLET | Freq: Every day | ORAL | Status: DC
  Administered 2016-10-29: 5 mg via ORAL
  Filled 2016-10-29: qty 1

## 2016-10-29 MED ORDER — PANTOPRAZOLE SODIUM 40 MG IV SOLR
40.0000 mg | Freq: Two times a day (BID) | INTRAVENOUS | Status: DC
  Administered 2016-10-29: 40 mg via INTRAVENOUS
  Filled 2016-10-29: qty 40

## 2016-10-29 MED ORDER — DIAZEPAM 5 MG PO TABS
5.0000 mg | ORAL_TABLET | Freq: Three times a day (TID) | ORAL | Status: DC | PRN
  Administered 2016-10-29: 5 mg via ORAL
  Filled 2016-10-29: qty 1

## 2016-10-29 MED ORDER — ALUM & MAG HYDROXIDE-SIMETH 200-200-20 MG/5ML PO SUSP
30.0000 mL | Freq: Four times a day (QID) | ORAL | Status: DC | PRN
  Administered 2016-10-29: 30 mL via ORAL
  Filled 2016-10-29: qty 30

## 2016-10-29 MED ORDER — SODIUM CHLORIDE 0.9 % IV SOLN
INTRAVENOUS | Status: DC
  Administered 2016-10-29: 01:00:00 via INTRAVENOUS

## 2016-10-29 MED ORDER — ACETAMINOPHEN 650 MG RE SUPP: 650.0000 mg | Freq: Four times a day (QID) | RECTAL | Status: DC | PRN

## 2016-10-29 MED ORDER — ONDANSETRON HCL 4 MG/2ML IJ SOLN: 4.0000 mg | Freq: Four times a day (QID) | INTRAMUSCULAR | Status: DC | PRN

## 2016-10-29 MED ORDER — CLOPIDOGREL BISULFATE 75 MG PO TABS: 75.0000 mg | ORAL_TABLET | Freq: Every day | ORAL | Status: DC

## 2016-10-29 MED ORDER — ONDANSETRON HCL 4 MG PO TABS: 4.0000 mg | ORAL_TABLET | Freq: Four times a day (QID) | ORAL | Status: DC | PRN

## 2016-10-29 NOTE — Progress Notes (Signed)
*  PRELIMINARY RESULTS* Echocardiogram 2D Echocardiogram has been performed.  Brian [name] [name] 10/29/2016, 9:18 AM

## 2016-10-29 NOTE — Care Management Obs Status (Signed)
MEDICARE OBSERVATION STATUS NOTIFICATION   Patient Details  Name: Brian [name] MRN: [id number] Date of Birth: Jul 27, 1933   Medicare Observation Status Notification Given:  No (discharge order in less than 24 hours)    Ival Bible, RN 10/29/2016, 10:47 AM

## 2016-10-29 NOTE — Progress Notes (Addendum)
Pt A and O x 4. VSS. Pt tolerating diet well. No complaints of pain or nausea. IV removed intact, prescriptions given. Pt voiced understanding of discharge instructions with no further questions. Pt informed to follow-up with primary care in 1 week and to call GI on Monday. Pt discharged via ambulating with nurse.

## 2016-10-29 NOTE — Plan of Care (Signed)
Problem: Fluid Volume: Goal: Ability to maintain a balanced intake and output will improve Outcome: Not Progressing Patient is NPO.  Problem: Nutrition: Goal: Adequate nutrition will be maintained Outcome: Not Progressing Patient is NPO.   

## 2016-10-30 NOTE — Discharge Summary (Signed)
Brian [name] NAME: Brian [name]    MR#:  [id number]  DATE OF BIRTH:  09-15-1933  DATE OF ADMISSION:  10/28/2016 ADMITTING PHYSICIAN: Lance Coon, MD  DATE OF DISCHARGE: 10/29/2016  3:26 PM  PRIMARY CARE PHYSICIAN: Park Liter, DO    ADMISSION DIAGNOSIS:  Hematochezia [K92.1] Weakness [R53.1] Acute GI bleeding [K92.2]  DISCHARGE DIAGNOSIS:  Principal Problem:   Hematochezia Active Problems:   GERD (gastroesophageal reflux disease)   Anxiety and depression   HTN (hypertension)   Hyperlipidemia   CAD (coronary artery disease)   HFrEF (heart failure with reduced ejection fraction) ([place])   SECONDARY DIAGNOSIS:   Past Medical History:  Diagnosis Date  . AAA (abdominal aortic aneurysm) (HCC)    2.9 CM infrarenal AAA on CT 11/2011  . Alcoholism ([place])   . Anxiety    panic attacks, rare.  . Aortic aneurysm ([facility])    small  . Arthritis   . Coronary artery disease    2 stents in july 2015  . Depression with anxiety   . Diverticulosis of colon 10/2009  . 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   . Gastritis 03/2012  . GERD (gastroesophageal reflux disease)   . GI bleed   . HFrEF (heart failure with reduced ejection fraction) ([place])   . HOH (hard of hearing)   . Hyperlipemia   . Hypertension   . Hypovolemic shock ([place])   . Kidney stones   . Myocardial infarction 2015  . Positive H. pylori titer ?, before 2010   treated with Abx.   . Tubular adenoma of colon     HOSPITAL COURSE:   82 year old male with past medical history of previous MI, aortic aneurysm, systolic CHF, anxiety, history of diverticulosis, history of previous GI bleed after polypectomy, hypertension, hyperlipidemia who presented to the hospital due to multiple episodes of hematochezia.   1. GI bleed-this was a lower GI bleed given the patient's hematochezia. Patient was observed in the hospital. His hemoglobin did not drift down any further. -His  aspirin and Plavix were held. His diet was slowly advanced from a clear to a regular diet which she is not tolerating without any exacerbation of his bleeding. He was advised to hold his Plavix until his bleeding completely stops but continue his aspirin. He is going to follow up with his primary care physician as outpatient in next week to 2 weeks.  2. History of coronary artery disease-patient will continue his aspirin, hold his Plavix until his bleeding stops.  -We will continue his metoprolol lisinopril and atorvastatin.  3. Hypertension-patient will resume his home antihypertensives.  4. Hyperlipidemia - pt. Will resume his Atorvastatin.   DISCHARGE CONDITIONS:   Stable.   CONSULTS OBTAINED:  Treatment Team:  San Jetty, MD  DRUG ALLERGIES:   Allergies  Allergen Reactions  . Zoloft [Sertraline Hcl] Nausea Only    Patient states that he was not himself when he took this drug    DISCHARGE MEDICATIONS:   Allergies as of 10/29/2016      Reactions   Zoloft [sertraline Hcl] Nausea Only   Patient states that he was not himself when he took this drug      Medication List    TAKE these medications   aspirin 81 MG chewable tablet Chew 81 mg by mouth daily.   atorvastatin 80 MG tablet Commonly known as:  LIPITOR Take 1 tablet (80 mg total) by mouth daily at 6 PM. What  changed:  when to take this   clopidogrel 75 MG tablet Commonly known as:  PLAVIX Take 75 mg by mouth daily.   diazepam 5 MG tablet Commonly known as:  VALIUM Take 5 mg by mouth every 8 (eight) hours as needed (for anxiety).   lisinopril 5 MG tablet Commonly known as:  PRINIVIL,ZESTRIL Take 5 mg by mouth daily.   metoprolol succinate 25 MG 24 hr tablet Commonly known as:  TOPROL-XL Take 25 mg by mouth daily.   nitroGLYCERIN 0.4 MG SL tablet Commonly known as:  NITROSTAT Place 0.4 mg under the tongue every 5 (five) minutes x 3 doses as needed for chest pain.   traMADol 50 MG tablet Commonly  known as:  ULTRAM Take 1 tablet (50 mg total) by mouth every 4 (four) hours as needed. What changed:  reasons to take this         DISCHARGE INSTRUCTIONS:   DIET:  Cardiac diet  DISCHARGE CONDITION:  Stable  ACTIVITY:  Activity as tolerated  OXYGEN:  Home Oxygen: No.   Oxygen Delivery: room air  DISCHARGE LOCATION:  home   If you experience worsening of your admission symptoms, develop shortness of breath, life threatening emergency, suicidal or homicidal thoughts you must seek medical attention immediately by calling 911 or calling your MD immediately  if symptoms less severe.  You Must read complete instructions/literature along with all the possible adverse reactions/side effects for all the Medicines you take and that have been prescribed to you. Take any new Medicines after you have completely understood and accpet all the possible adverse reactions/side effects.   Please note  You were cared for by a hospitalist during your hospital stay. If you have any questions about your discharge medications or the care you received while you were in the hospital after you are discharged, you can call the unit and asked to speak with the hospitalist on call if the hospitalist that took care of you is not available. Once you are discharged, your primary care physician will handle any further medical issues. Please note that NO REFILLS for any discharge medications will be authorized once you are discharged, as it is imperative that you return to your primary care physician (or establish a relationship with a primary care physician if you do not have one) for your aftercare needs so that they can reassess your need for medications and monitor your lab values.     Today   No abdominal pain, N/V. No further rectal bleeding.    VITAL SIGNS:  Blood pressure 135/71, pulse 68, temperature 97.3 F (36.3 C), temperature source Oral, resp. rate 18, height 5\' 8"  (1.727 m), weight 70.1 kg  (154 lb 9.6 oz), SpO2 95 %.  I/O:  No intake or output data in the 24 hours ending 10/30/16 1613  PHYSICAL EXAMINATION:  GENERAL:  82 y.o.-year-old patient lying in the bed with no acute distress.  EYES: Pupils equal, round, reactive to light and accommodation. No scleral icterus. Extraocular muscles intact.  HEENT: Head atraumatic, normocephalic. Oropharynx and nasopharynx clear.  NECK:  Supple, no jugular venous distention. No thyroid enlargement, no tenderness.  LUNGS: Normal breath sounds bilaterally, no wheezing, rales,rhonchi. No use of accessory muscles of respiration.  CARDIOVASCULAR: S1, S2 normal. No murmurs, rubs, or gallops.  ABDOMEN: Soft, non-tender, non-distended. Bowel sounds present. No organomegaly or mass.  EXTREMITIES: No pedal edema, cyanosis, or clubbing.  NEUROLOGIC: Cranial nerves II through XII are intact. No focal motor or sensory defecits b/l.  PSYCHIATRIC: The patient is alert and oriented x 3. Good affect.  SKIN: No obvious rash, lesion, or ulcer.   DATA REVIEW:   CBC  Recent Labs Lab 10/29/16 0101  10/29/16 1244  WBC 8.8  --   --   HGB 14.2  < > 14.2  HCT 41.1  --   --   PLT 145*  --   --   < > = values in this interval not displayed.  Chemistries   Recent Labs Lab 10/28/16 1922 10/29/16 0101  NA 141 143  K 4.2 3.9  CL 108 110  CO2 28 28  GLUCOSE 109* 99  BUN 13 12  CREATININE 0.68 0.75  CALCIUM 9.4 9.1  AST 20  --   ALT 19  --   ALKPHOS 70  --   BILITOT 0.7  --     Cardiac Enzymes  Recent Labs Lab 10/28/16 1922  TROPONINI <0.03    Microbiology Results  Results for orders placed or performed during the hospital encounter of 06/05/15  MRSA PCR Screening     Status: None   Collection Time: 06/05/15  8:56 PM  Result Value Ref Range Status   MRSA by PCR NEGATIVE NEGATIVE Final    Comment:        The GeneXpert MRSA Assay (FDA approved for NASAL specimens only), is one component of a comprehensive MRSA  colonization surveillance program. It is not intended to diagnose MRSA infection nor to guide or monitor treatment for MRSA infections.     RADIOLOGY:  No results found.    Management plans discussed with the patient, family and they are in agreement.  CODE STATUS:  Code Status History    Date Active Date Inactive Code Status Order ID Comments User Context   10/29/2016 12:33 AM 10/29/2016  6:31 PM Full Code [id number]  Lance Coon, MD Inpatient   06/05/2015  9:07 PM 06/08/2015  3:10 PM Full Code [id number]  Vaughan Basta, MD Inpatient   06/05/2015  2:23 PM 06/05/2015  9:07 PM DNR [id number]  Vaughan Basta, MD ED   05/03/2015  8:10 PM 05/04/2015  5:57 PM DNR [id number]  Bettey Costa, MD Inpatient   05/29/2014 11:12 PM 06/02/2014  7:20 PM Full Code [id number]  Adrian Prows, MD Inpatient    Advance Directive Documentation   Flowsheet Row Most Recent Value  Type of Advance Directive  Healthcare Power of Attorney, Living will  Pre-existing out of facility DNR order (yellow form or pink MOST form)  No data  "MOST" Form in Place?  No data      TOTAL TIME TAKING CARE OF THIS PATIENT: 40 minutes.    Henreitta Leber M.D on 10/30/2016 at 4:13 PM  Between 7am to 6pm - Pager - [phone]  After 6pm go to [email or web address] - [employer] [facility]  Office  [phone]  CC: Primary care physician; Park Liter, DO

## 2016-11-16 DIAGNOSIS — H2512 Age-related nuclear cataract, left eye: Secondary | ICD-10-CM | POA: Diagnosis not present

## 2016-11-27 ENCOUNTER — Encounter: Payer: Self-pay | Admitting: *Deleted

## 2016-12-07 ENCOUNTER — Ambulatory Visit: Payer: Medicare Other | Admitting: Anesthesiology

## 2016-12-07 ENCOUNTER — Encounter: Payer: Self-pay | Admitting: *Deleted

## 2016-12-07 ENCOUNTER — Encounter
Admission: RE | Disposition: A | Discharge status: Home / Self Care | Payer: Self-pay | Source: Ambulatory Visit | Attending: Ophthalmology

## 2016-12-07 ENCOUNTER — Ambulatory Visit
Admission: RE | Admit: 2016-12-07 | Discharge: 2016-12-07 | Disposition: A | Discharge status: Home / Self Care | Payer: Medicare Other | Source: Ambulatory Visit | Attending: Ophthalmology | Admitting: Ophthalmology

## 2016-12-07 DIAGNOSIS — Z7982 Long term (current) use of aspirin: Secondary | ICD-10-CM | POA: Diagnosis not present

## 2016-12-07 DIAGNOSIS — I252 Old myocardial infarction: Secondary | ICD-10-CM | POA: Insufficient documentation

## 2016-12-07 DIAGNOSIS — F17200 Nicotine dependence, unspecified, uncomplicated: Secondary | ICD-10-CM | POA: Diagnosis not present

## 2016-12-07 DIAGNOSIS — E7800 Pure hypercholesterolemia, unspecified: Secondary | ICD-10-CM | POA: Diagnosis not present

## 2016-12-07 DIAGNOSIS — H2512 Age-related nuclear cataract, left eye: Secondary | ICD-10-CM | POA: Insufficient documentation

## 2016-12-07 DIAGNOSIS — I2510 Atherosclerotic heart disease of native coronary artery without angina pectoris: Secondary | ICD-10-CM | POA: Insufficient documentation

## 2016-12-07 DIAGNOSIS — Z7902 Long term (current) use of antithrombotics/antiplatelets: Secondary | ICD-10-CM | POA: Diagnosis not present

## 2016-12-07 DIAGNOSIS — Z955 Presence of coronary angioplasty implant and graft: Secondary | ICD-10-CM | POA: Insufficient documentation

## 2016-12-07 DIAGNOSIS — Z79899 Other long term (current) drug therapy: Secondary | ICD-10-CM | POA: Diagnosis not present

## 2016-12-07 DIAGNOSIS — I10 Essential (primary) hypertension: Secondary | ICD-10-CM | POA: Diagnosis not present

## 2016-12-07 DIAGNOSIS — I739 Peripheral vascular disease, unspecified: Secondary | ICD-10-CM | POA: Diagnosis not present

## 2016-12-07 DIAGNOSIS — H269 Unspecified cataract: Secondary | ICD-10-CM | POA: Diagnosis not present

## 2016-12-07 HISTORY — PX: CATARACT EXTRACTION W/PHACO: SHX586

## 2016-12-07 HISTORY — DX: Abdominal aortic aneurysm, without rupture: I71.4

## 2016-12-07 HISTORY — DX: Abdominal aortic aneurysm, without rupture, unspecified: I71.40

## 2016-12-07 HISTORY — DX: Panic disorder (episodic paroxysmal anxiety): F41.0

## 2016-12-07 SURGERY — PHACOEMULSIFICATION, CATARACT, WITH IOL INSERTION
Anesthesia: Monitor Anesthesia Care | Site: Eye | Laterality: Left | Wound class: Clean

## 2016-12-07 MED ORDER — SODIUM CHLORIDE 0.9 % IV SOLN
INTRAVENOUS | Status: DC
  Administered 2016-12-07 (×2): via INTRAVENOUS

## 2016-12-07 MED ORDER — SODIUM HYALURONATE 23 MG/ML IO SOLN
INTRAOCULAR | Status: DC | PRN
  Administered 2016-12-07: 0.6 mL via INTRAOCULAR

## 2016-12-07 MED ORDER — LIDOCAINE HCL (PF) 4 % IJ SOLN
INTRAOCULAR | Status: DC | PRN
  Administered 2016-12-07: 4 mL via OPHTHALMIC

## 2016-12-07 MED ORDER — ARMC OPHTHALMIC DILATING DROPS
OPHTHALMIC | Status: AC
  Filled 2016-12-07: qty 0.4

## 2016-12-07 MED ORDER — SODIUM HYALURONATE 23 MG/ML IO SOLN
INTRAOCULAR | Status: AC
  Filled 2016-12-07: qty 0.6

## 2016-12-07 MED ORDER — MOXIFLOXACIN HCL 0.5 % OP SOLN
OPHTHALMIC | Status: DC | PRN
  Administered 2016-12-07: 0.2 mL via OPHTHALMIC

## 2016-12-07 MED ORDER — POVIDONE-IODINE 5 % OP SOLN
OPHTHALMIC | Status: AC
  Filled 2016-12-07: qty 30

## 2016-12-07 MED ORDER — MIDAZOLAM HCL 2 MG/2ML IJ SOLN
INTRAMUSCULAR | Status: DC | PRN
  Administered 2016-12-07: 1 mg via INTRAVENOUS

## 2016-12-07 MED ORDER — LIDOCAINE HCL (PF) 4 % IJ SOLN
INTRAMUSCULAR | Status: AC
  Filled 2016-12-07: qty 5

## 2016-12-07 MED ORDER — MIDAZOLAM HCL 2 MG/2ML IJ SOLN
INTRAMUSCULAR | Status: AC
  Filled 2016-12-07: qty 2

## 2016-12-07 MED ORDER — ARMC OPHTHALMIC DILATING DROPS
1.0000 "application " | OPHTHALMIC | Status: DC
  Administered 2016-12-07: 1 via OPHTHALMIC

## 2016-12-07 MED ORDER — SODIUM HYALURONATE 10 MG/ML IO SOLN
INTRAOCULAR | Status: AC
  Filled 2016-12-07: qty 0.85

## 2016-12-07 MED ORDER — EPINEPHRINE PF 1 MG/ML IJ SOLN
INTRAMUSCULAR | Status: AC
  Filled 2016-12-07: qty 1

## 2016-12-07 MED ORDER — MOXIFLOXACIN HCL 0.5 % OP SOLN
OPHTHALMIC | Status: AC
  Filled 2016-12-07: qty 3

## 2016-12-07 MED ORDER — BSS IO SOLN
INTRAOCULAR | Status: DC | PRN
  Administered 2016-12-07: 250 mL via INTRAOCULAR

## 2016-12-07 MED ORDER — SODIUM HYALURONATE 10 MG/ML IO SOLN
INTRAOCULAR | Status: DC | PRN
  Administered 2016-12-07: 0.55 mL via INTRAOCULAR

## 2016-12-07 MED ORDER — MOXIFLOXACIN HCL 0.5 % OP SOLN: 1.0000 [drp] | OPHTHALMIC | Status: DC | PRN

## 2016-12-07 SURGICAL SUPPLY — 20 items
CANNULA ANT/CHMB 27GA (MISCELLANEOUS) ×4 IMPLANT
CUP MEDICINE 2OZ PLAST GRAD ST (MISCELLANEOUS) ×2 IMPLANT
DISSECTOR HYDRO NUCLEUS 50X22 (MISCELLANEOUS) ×2 IMPLANT
GLOVE BIO SURGEON STRL SZ8 (GLOVE) ×2 IMPLANT
GLOVE BIOGEL M 6.5 STRL (GLOVE) ×2 IMPLANT
GLOVE SURG LX 7.5 STRW (GLOVE) ×1
GLOVE SURG LX STRL 7.5 STRW (GLOVE) ×1 IMPLANT
GOWN STRL REUS W/ TWL LRG LVL3 (GOWN DISPOSABLE) ×2 IMPLANT
GOWN STRL REUS W/TWL LRG LVL3 (GOWN DISPOSABLE) ×2
LENS IOL TECNIS ITEC 21.0 (Intraocular Lens) ×2 IMPLANT
PACK CATARACT (MISCELLANEOUS) ×2 IMPLANT
PACK CATARACT KING (MISCELLANEOUS) ×2 IMPLANT
PACK EYE AFTER SURG (MISCELLANEOUS) ×2 IMPLANT
SOL BSS BAG (MISCELLANEOUS) ×2
SOLUTION BSS BAG (MISCELLANEOUS) ×1 IMPLANT
SYR 3ML LL SCALE MARK (SYRINGE) ×4 IMPLANT
SYR 5ML LL (SYRINGE) ×2 IMPLANT
SYR TB 1ML 27GX1/2 LL (SYRINGE) ×2 IMPLANT
WATER STERILE IRR 250ML POUR (IV SOLUTION) ×2 IMPLANT
WIPE NON LINTING 3.25X3.25 (MISCELLANEOUS) ×2 IMPLANT

## 2016-12-07 NOTE — Anesthesia Post-op Follow-up Note (Cosign Needed)
Anesthesia QCDR form completed.        

## 2016-12-07 NOTE — Transfer of Care (Signed)
Immediate Anesthesia Transfer of Care Note  Patient: Brian [name]  Procedure(s) Performed: Procedure(s) with comments: CATARACT EXTRACTION PHACO AND INTRAOCULAR LENS PLACEMENT (IOC) (Left) - [place] 00:42.7 ap 13.7 cde 5.85 lot #[id number]  Patient Location: PACU and Short Stay  Anesthesia Type:MAC  Level of Consciousness: awake, alert  and oriented  Airway & Oxygen Therapy: Patient Spontanous Breathing  Post-op Assessment: Report given to RN and Post -op Vital signs reviewed and stable  Post vital signs: Reviewed and stable  Last Vitals:  Vitals:   12/07/16 0744 12/07/16 0925  BP: (!) 142/87 (!) 142/72  Pulse: 61 (!) 48  Resp: 18 16  Temp: 36.4 C (!) 35.8 C    Last Pain:  Vitals:   12/07/16 0744  TempSrc: Oral         Complications: No apparent anesthesia complications

## 2016-12-07 NOTE — Discharge Instructions (Signed)
Eye Surgery Discharge Instructions  Expect mild scratchy sensation or mild soreness. DO NOT RUB YOUR EYE!  The day of surgery:  Minimal physical activity, but bed rest is not required  No reading, computer work, or close hand work  No bending, lifting, or straining.  May watch TV  For 24 hours:  No driving, legal decisions, or alcoholic beverages  Safety precautions  Eat anything you prefer: It is better to start with liquids, then soup then solid foods.  _____ Eye patch should be worn until postoperative exam tomorrow.  ____ Solar shield eyeglasses should be worn for comfort in the sunlight/patch while sleeping  Resume all regular medications including aspirin or Coumadin if these were discontinued prior to surgery. You may shower, bathe, shave, or wash your hair. Tylenol may be taken for mild discomfort.  Call your doctor if you experience significant pain, nausea, or vomiting, fever > 101 or other signs of infection. [phone] or [phone] Specific instructions:  Follow-up Information    Benay Pillow, MD Follow up.   Specialty:  Ophthalmology Why:  January 26 at 8:50am Contact information: [address] [place] [address] [phone]

## 2016-12-07 NOTE — Anesthesia Procedure Notes (Signed)
Date/Time: 12/07/2016 9:01 AM Performed by: Jonna Clark Pre-anesthesia Checklist: Patient identified, Emergency Drugs available, Suction available, Patient being monitored and Timeout performed Oxygen Delivery Method: Nasal cannula

## 2016-12-07 NOTE — Anesthesia Postprocedure Evaluation (Signed)
Anesthesia Post Note  Patient: Brian [name]  Procedure(s) Performed: Procedure(s) (LRB): CATARACT EXTRACTION PHACO AND INTRAOCULAR LENS PLACEMENT (IOC) (Left)  Patient location during evaluation: Short Stay Anesthesia Type: MAC Level of consciousness: awake and alert Pain management: pain level controlled Vital Signs Assessment: post-procedure vital signs reviewed and stable Respiratory status: spontaneous breathing Cardiovascular status: stable Postop Assessment: no headache Anesthetic complications: no     Last Vitals:  Vitals:   12/07/16 0744 12/07/16 0925  BP: (!) 142/87 (!) 142/72  Pulse: 61 (!) 51  Resp: 18 18  Temp: 36.4 C 36.4 C    Last Pain:  Vitals:   12/07/16 0744  TempSrc: [name]

## 2016-12-07 NOTE — H&P (Signed)
The History and Physical notes are on paper, have been signed, and are to be scanned. The patient remains stable and unchanged from the H&P.   Previous H&P reviewed, patient examined, and there are no changes.  Brian [name] 12/07/2016 8:51 AM

## 2016-12-07 NOTE — Anesthesia Preprocedure Evaluation (Signed)
Anesthesia Evaluation  Patient identified by MRN, date of birth, ID band Patient awake    Reviewed: Allergy & Precautions, NPO status , Patient's Chart, lab work & pertinent test results, reviewed documented beta blocker date and time   History of Anesthesia Complications Negative for: history of anesthetic complications  Airway Mallampati: II  TM Distance: >3 FB Neck ROM: limited    Dental  (+) Chipped, Missing, Poor Dentition   Pulmonary neg shortness of breath, Current Smoker,           Cardiovascular Exercise Tolerance: Poor hypertension, Pt. on medications and Pt. on home beta blockers (-) angina+ CAD, + Past MI and + Peripheral Vascular Disease       Neuro/Psych PSYCHIATRIC DISORDERS Anxiety    GI/Hepatic GERD  Controlled,  Endo/Other    Renal/GU Renal disease     Musculoskeletal  (+) Arthritis ,   Abdominal   Peds  Hematology   Anesthesia Other Findings   Reproductive/Obstetrics                             Anesthesia Physical  Anesthesia Plan  ASA: III  Anesthesia Plan: MAC   Post-op Pain Management:    Induction:   Airway Management Planned:   Additional Equipment:   Intra-op Plan:   Post-operative Plan:   Informed Consent: I have reviewed the patients History and Physical, chart, labs and discussed the procedure including the risks, benefits and alternatives for the proposed anesthesia with the patient or authorized representative who has indicated his/her understanding and acceptance.     Plan Discussed with: CRNA  Anesthesia Plan Comments:         Anesthesia Quick Evaluation

## 2016-12-07 NOTE — Op Note (Signed)
OPERATIVE NOTE  Brian [name] [id number] 12/07/2016   PREOPERATIVE DIAGNOSIS:  Nuclear sclerotic cataract left eye.  H25.12   POSTOPERATIVE DIAGNOSIS:    Nuclear sclerotic cataract left eye.     PROCEDURE:  Phacoemusification with posterior chamber intraocular lens placement of the left eye   LENS:   Implant Name Type Inv. Item Serial No. Manufacturer Lot No. LRB No. Used  LENS IOL DIOP 21.0 - [id number] Intraocular Lens LENS IOL DIOP 21.0 [id number] AMO   Left 1       PCB00 +21.0   ULTRASOUND TIME: 0 minutes 42.7 seconds.  CDE 5.85   SURGEON:  Benay Pillow, MD, MPH   ANESTHESIA:  Topical with tetracaine drops augmented with 1% preservative-free intracameral lidocaine.  ESTIMATED BLOOD LOSS: <1 mL   COMPLICATIONS:  None.   DESCRIPTION OF PROCEDURE:  The patient was identified in the holding room and transported to the operating room and placed in the supine position under the operating microscope.  The left eye was identified as the operative eye and it was prepped and draped in the usual sterile ophthalmic fashion.   A 1.0 millimeter clear-corneal paracentesis was made at the 5:00 position. 0.5 ml of preservative-free 1% lidocaine with epinephrine was injected into the anterior chamber.  The anterior chamber was filled with Healon 5 viscoelastic.  A 2.4 millimeter keratome was used to make a near-clear corneal incision at the 2:00 position.  A curvilinear capsulorrhexis was made with a cystotome and capsulorrhexis forceps.  Balanced salt solution was used to hydrodissect and hydrodelineate the nucleus.   Phacoemulsification was then used in stop and chop fashion to remove the lens nucleus and epinucleus.  The remaining cortex was then removed using the irrigation and aspiration handpiece. Healon was then placed into the capsular bag to distend it for lens placement.  A lens was then injected into the capsular bag.  The remaining viscoelastic was aspirated.   Wounds were  hydrated with balanced salt solution.  The anterior chamber was inflated to a physiologic pressure with balanced salt solution.  Intracameral vigamox 0.1 mL undiltued was injected into the eye and a drop placed onto the ocular surface.  No wound leaks were noted.  The patient was taken to the recovery room in stable condition without complications of anesthesia or surgery  Benay Pillow 12/07/2016, 9:24 AM

## 2016-12-18 ENCOUNTER — Ambulatory Visit (INDEPENDENT_AMBULATORY_CARE_PROVIDER_SITE_OTHER): Payer: Medicare Other | Admitting: Family Medicine

## 2016-12-18 ENCOUNTER — Encounter: Payer: Self-pay | Admitting: Family Medicine

## 2016-12-18 VITALS — BP 130/82 | HR 75 | Temp 97.1°F | Ht 67.0 in | Wt 154.7 lb

## 2016-12-18 DIAGNOSIS — Z23 Encounter for immunization: Secondary | ICD-10-CM | POA: Diagnosis not present

## 2016-12-18 DIAGNOSIS — M1990 Unspecified osteoarthritis, unspecified site: Secondary | ICD-10-CM

## 2016-12-18 DIAGNOSIS — I10 Essential (primary) hypertension: Secondary | ICD-10-CM

## 2016-12-18 DIAGNOSIS — K219 Gastro-esophageal reflux disease without esophagitis: Secondary | ICD-10-CM

## 2016-12-18 DIAGNOSIS — E440 Moderate protein-calorie malnutrition: Secondary | ICD-10-CM | POA: Diagnosis not present

## 2016-12-18 DIAGNOSIS — Z7902 Long term (current) use of antithrombotics/antiplatelets: Secondary | ICD-10-CM | POA: Diagnosis not present

## 2016-12-18 DIAGNOSIS — E782 Mixed hyperlipidemia: Secondary | ICD-10-CM

## 2016-12-18 DIAGNOSIS — F418 Other specified anxiety disorders: Secondary | ICD-10-CM | POA: Diagnosis not present

## 2016-12-18 DIAGNOSIS — N200 Calculus of kidney: Secondary | ICD-10-CM

## 2016-12-18 DIAGNOSIS — I5022 Chronic systolic (congestive) heart failure: Secondary | ICD-10-CM | POA: Diagnosis not present

## 2016-12-18 DIAGNOSIS — Z0000 Encounter for general adult medical examination without abnormal findings: Secondary | ICD-10-CM

## 2016-12-18 DIAGNOSIS — I2510 Atherosclerotic heart disease of native coronary artery without angina pectoris: Secondary | ICD-10-CM | POA: Diagnosis not present

## 2016-12-18 DIAGNOSIS — F329 Major depressive disorder, single episode, unspecified: Secondary | ICD-10-CM

## 2016-12-18 DIAGNOSIS — L409 Psoriasis, unspecified: Secondary | ICD-10-CM

## 2016-12-18 DIAGNOSIS — I714 Abdominal aortic aneurysm, without rupture, unspecified: Secondary | ICD-10-CM

## 2016-12-18 DIAGNOSIS — R350 Frequency of micturition: Secondary | ICD-10-CM | POA: Diagnosis not present

## 2016-12-18 DIAGNOSIS — F419 Anxiety disorder, unspecified: Secondary | ICD-10-CM

## 2016-12-18 DIAGNOSIS — Z8719 Personal history of other diseases of the digestive system: Secondary | ICD-10-CM | POA: Diagnosis not present

## 2016-12-18 DIAGNOSIS — F32A Depression, unspecified: Secondary | ICD-10-CM

## 2016-12-18 MED ORDER — ATORVASTATIN CALCIUM 80 MG PO TABS: 80.0000 mg | ORAL_TABLET | Freq: Every day | ORAL | 3 refills | Status: DC

## 2016-12-18 MED ORDER — TRIAMCINOLONE ACETONIDE 0.5 % EX OINT: 1.0000 "application " | TOPICAL_OINTMENT | Freq: Two times a day (BID) | CUTANEOUS | 1 refills | Status: DC

## 2016-12-18 NOTE — Assessment & Plan Note (Signed)
No issues at this time. Will check urine. Await results. Call with any concerns.

## 2016-12-18 NOTE — Assessment & Plan Note (Signed)
Has been having his US's done. Due again in 09/2018. Continue to monitor.

## 2016-12-18 NOTE — Assessment & Plan Note (Signed)
Continue to work on diet and exercise. Weight stable.

## 2016-12-18 NOTE — Assessment & Plan Note (Signed)
Continue to follow with cardiology. Call with any concerns.  

## 2016-12-18 NOTE — Assessment & Plan Note (Signed)
Follows with Dr. Humphrey Rolls. Call with any concerns.

## 2016-12-18 NOTE — Assessment & Plan Note (Signed)
Stable. Continue current regimen. Continue to monitor. Call with any concerns.  

## 2016-12-18 NOTE — Assessment & Plan Note (Signed)
Checking CBC today. Continue to monitor.

## 2016-12-18 NOTE — Assessment & Plan Note (Signed)
Rechecking levels today. Await results.  

## 2016-12-18 NOTE — Progress Notes (Signed)
BP 130/82 (BP Location: Left Arm, Patient Position: Sitting, Cuff Size: Normal)   Pulse 75   Temp 97.1 F (36.2 C)   Ht 5\' 7"  (1.702 m)   Wt 154 lb 11.2 oz (70.2 kg)   SpO2 95%   BMI 24.23 kg/m    Subjective:    Patient ID: Brian [name], male    DOB: 06-10-1933, 83 y.o.   MRN: [id number]  HPI: Brian [name] is a 83 y.o. male presenting on 12/18/2016 for comprehensive medical examination. Current medical complaints include:  HYPERTENSION / HYPERLIPIDEMIA Satisfied with current treatment? yes Duration of hypertension: chronic BP monitoring frequency: not checking BP medication side effects: no Past BP meds: lisinopril, metoprolol Duration of hyperlipidemia: chronic Cholesterol medication side effects: no Cholesterol supplements: none Past cholesterol medications: atorvastain (lipitor) Medication compliance: excellent compliance Aspirin: yes Recent stressors: yes Recurrent headaches: no Visual changes: yes- just had cataract surgery Palpitations: no Dyspnea: no Chest pain: no Lower extremity edema: no Dizzy/lightheaded: yes  He currently lives with: wife Interim Problems from his last visit: no  Functional Status Survey: Is the patient deaf or have difficulty hearing?: Yes Does the patient have difficulty seeing, even when wearing glasses/contacts?: No Does the patient have difficulty concentrating, remembering, or making decisions?: Yes Does the patient have difficulty walking or climbing stairs?: No Does the patient have difficulty dressing or bathing?: No Does the patient have difficulty doing errands alone such as visiting a doctor's office or shopping?: No  FALL RISK: Fall Risk  12/18/2016 01/14/2016  Falls in the past year? No Yes  Number falls in past yr: - 1  Injury with Fall? - No    Depression Screen Depression screen [facility] 2/9 12/18/2016 01/14/2016 01/14/2016  Decreased Interest 0 1 1  Down, Depressed, Hopeless 0 1 0  PHQ - 2 Score 0 2 1  Altered sleeping -  0 -  Tired, decreased energy - 2 -  Change in appetite - 1 -  Feeling bad or failure about yourself  - 0 -  Trouble concentrating - 0 -  Moving slowly or fidgety/restless - 1 -  PHQ-9 Score - 6 -  Difficult doing work/chores - Not difficult at all -    Advanced Directives See Appropriate Area of the Chart  Past Medical History:  Past Medical History:  Diagnosis Date  . AAA (abdominal aortic aneurysm) (HCC)    2.9 CM infrarenal AAA on CT 11/2011  . Alcoholism ([place])   . Aneurysm of abdominal aorta (HCC)    SMALL  . Anxiety    panic attacks, rare.  . Aortic aneurysm (HCC)    small  . Arthritis   . Coronary artery disease    2 stents in july 2015  . Depression with anxiety   . Diverticulosis of colon 10/2009  . 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   . Gastritis 03/2012  . GERD (gastroesophageal reflux disease)   . GI bleed   . HFrEF (heart failure with reduced ejection fraction) ([place])   . HOH (hard of hearing)   . Hyperlipemia   . Hypertension   . Hypovolemic shock ([place])   . Kidney stones   . Myocardial infarction 2015  . Panic attacks   . Positive H. pylori titer ?, before 2010   treated with Abx.   . Tubular adenoma of colon     Surgical History:  Past Surgical History:  Procedure Laterality Date  . CARDIAC CATHETERIZATION  2015   Stents  .  CATARACT EXTRACTION W/PHACO Right 09/21/2016   Procedure: CATARACT EXTRACTION PHACO AND INTRAOCULAR LENS PLACEMENT (IOC);  Surgeon: Eulogio Bear, MD;  Location: [facility] ORS;  Service: Ophthalmology;  Laterality: Right;  [place] 1.10 AP% 13.8 CDE 9.93 Fluid pack lot # [id number]  . CATARACT EXTRACTION W/PHACO Left 12/07/2016   Procedure: CATARACT EXTRACTION PHACO AND INTRAOCULAR LENS PLACEMENT (IOC);  Surgeon: Eulogio Bear, MD;  Location: [facility] ORS;  Service: Ophthalmology;  Laterality: Left;  [place] 00:42.7 ap 13.7 cde 5.85 lot [id number]  . COLONOSCOPY WITH PROPOFOL Left 06/06/2015   Procedure: COLONOSCOPY WITH PROPOFOL;   Surgeon: Hulen Luster, MD;  Location: [facility] ENDOSCOPY;  Service: Endoscopy;  Laterality: Left;  . CORONARY ANGIOPLASTY     2015  . LEFT HEART CATHETERIZATION WITH CORONARY ANGIOGRAM Bilateral 05/29/2014   Procedure: LEFT HEART CATHETERIZATION WITH CORONARY ANGIOGRAM;  Surgeon: Laverda Page, MD;  Location: [facility] CATH LAB;  Service: Cardiovascular;  Laterality: Bilateral;  . PERCUTANEOUS CORONARY STENT INTERVENTION (PCI-S)  05/29/2014   Procedure: PERCUTANEOUS CORONARY STENT INTERVENTION (PCI-S);  Surgeon: Laverda Page, MD;  Location: [facility] CATH LAB;  Service: Cardiovascular;;  DES x2 Prox and Mid LAD     Medications:  Current Outpatient Prescriptions on File Prior to Visit  Medication Sig  . aspirin 81 MG chewable tablet Chew 81 mg by mouth daily.  . clopidogrel (PLAVIX) 75 MG tablet Take 75 mg by mouth daily.   . diazepam (VALIUM) 5 MG tablet Take 5 mg by mouth every 8 (eight) hours as needed (for anxiety).   Marland Kitchen lisinopril (PRINIVIL,ZESTRIL) 5 MG tablet Take 5 mg by mouth daily.   . metoprolol succinate (TOPROL-XL) 25 MG 24 hr tablet Take 25 mg by mouth daily.   . nitroGLYCERIN (NITROSTAT) 0.4 MG SL tablet Place 0.4 mg under the tongue every 5 (five) minutes x 3 doses as needed for chest pain.  . traMADol (ULTRAM) 50 MG tablet Take 1 tablet (50 mg total) by mouth every 4 (four) hours as needed. (Patient taking differently: Take 50 mg by mouth every 4 (four) hours as needed (for pain). )   No current facility-administered medications on file prior to visit.     Allergies:  Allergies  Allergen Reactions  . Zoloft [Sertraline Hcl] Nausea Only    Patient states that he was not himself when he took this drug    Social History:  Social History   Social History  . Marital status: Married    Spouse name: N/A  . Number of children: N/A  . Years of education: N/A   Occupational History  . Not on file.   Social History Main Topics  . Smoking status: Current Every Day Smoker     Packs/day: 0.25    Years: 60.00    Types: Cigarettes  . Smokeless tobacco: Never Used  . Alcohol use No  . Drug use: No  . Sexual activity: No   Other Topics Concern  . Not on file   Social History Narrative  . No narrative on file   History  Smoking Status  . Current Every Day Smoker  . Packs/day: 0.25  . Years: 60.00  . Types: Cigarettes  Smokeless Tobacco  . Never Used   History  Alcohol Use No    Family History:  Family History  Problem Relation Age of Onset  . Ulcers Father     stomach   . Heart disease Father   . Heart disease Mother   . Anuerysm Brother   .  Hypertension Daughter   . Heart disease Paternal Grandmother   . Heart attack Paternal Grandmother   . Heart disease Paternal Grandfather   . Colon cancer Neg Hx     Past medical history, surgical history, medications, allergies, family history and social history reviewed with patient today and changes made to appropriate areas of the chart.   Review of Systems  Constitutional: Negative.   HENT: Positive for hearing loss. Negative for congestion, ear discharge, ear pain, nosebleeds, sinus pain, sore throat and tinnitus.   Eyes: Negative.   Respiratory: Negative.  Negative for stridor.   Cardiovascular: Negative.   Gastrointestinal: Positive for abdominal pain, blood in stool, constipation, diarrhea and nausea. Negative for heartburn, melena and vomiting.       Follows with Dr. [name] in [place]- hasn't seen him in about 18 months  Genitourinary: Positive for frequency. Negative for dysuria, flank pain, hematuria and urgency.  Musculoskeletal: Positive for joint pain. Negative for back pain, falls, myalgias and neck pain.  Skin: Positive for rash (spot on his R leg that has been there about 6 months). Negative for itching.  Neurological: Negative.   Endo/Heme/Allergies: Negative for environmental allergies and polydipsia. Bruises/bleeds easily.  Psychiatric/Behavioral: Positive for depression.  Negative for hallucinations, memory loss, substance abuse and suicidal ideas. The patient is nervous/anxious. The patient does not have insomnia.     All other ROS negative except what is listed above and in the HPI.      Objective:    BP 130/82 (BP Location: Left Arm, Patient Position: Sitting, Cuff Size: Normal)   Pulse 75   Temp 97.1 F (36.2 C)   Ht 5\' 7"  (1.702 m)   Wt 154 lb 11.2 oz (70.2 kg)   SpO2 95%   BMI 24.23 kg/m   Wt Readings from Last 3 Encounters:  12/18/16 154 lb 11.2 oz (70.2 kg)  11/27/16 154 lb (69.9 kg)  10/29/16 154 lb 9.6 oz (70.1 kg)     Visual Acuity Screening   Right eye Left eye Both eyes  Without correction: 20/50 20/30 20/25   With correction:       Physical Exam  Constitutional: He is oriented to person, place, and time. He appears well-developed and well-nourished. No distress.  HENT:  Head: Normocephalic and atraumatic.  Right Ear: Hearing, tympanic membrane, external ear and ear canal normal.  Left Ear: Hearing, tympanic membrane, external ear and ear canal normal.  Nose: Nose normal.  Mouth/Throat: Uvula is midline, oropharynx is clear and moist and mucous membranes are normal. No oropharyngeal exudate.  Eyes: Conjunctivae, EOM and lids are normal. Pupils are equal, round, and reactive to light. Right eye exhibits no discharge. Left eye exhibits no discharge. No scleral icterus.  Neck: Normal range of motion. Neck supple. No JVD present. No tracheal deviation present. No thyromegaly present.  Cardiovascular: Normal rate, regular rhythm, normal heart sounds and intact distal pulses.  Exam reveals no gallop and no friction rub.   No murmur heard. Pulmonary/Chest: Effort normal and breath sounds normal. No stridor. No respiratory distress. He has no wheezes. He has no rales. He exhibits no tenderness.  Abdominal: Soft. Bowel sounds are normal. He exhibits no distension and no mass. There is no tenderness. There is no rebound and no guarding.    Genitourinary:  Genitourinary Comments: Deferred with shared decision making  Musculoskeletal: Normal range of motion. He exhibits no edema, tenderness or deformity.  Lymphadenopathy:    He has no cervical adenopathy.  Neurological: He is alert  and oriented to person, place, and time. He has normal reflexes. He displays normal reflexes. No cranial nerve deficit. He exhibits normal muscle tone. Coordination normal.  Skin: Skin is warm, dry and intact. No rash noted. He is not diaphoretic. No erythema. No pallor.  1 inch patch of scaley erythematous skin on R inner calf  Psychiatric: He has a normal mood and affect. His speech is normal and behavior is normal. Judgment and thought content normal. Cognition and memory are normal.  Nursing note and vitals reviewed.   6CIT Screen 12/18/2016  What Year? 0 points  What month? 0 points  What time? 0 points  Count back from 20 0 points  Months in reverse 0 points  Repeat phrase 0 points  Total Score 0     Results for orders placed or performed during the hospital encounter of 10/28/16  Comprehensive metabolic panel  Result Value Ref Range   Sodium 141 135 - 145 mmol/L   Potassium 4.2 3.5 - 5.1 mmol/L   Chloride 108 101 - 111 mmol/L   CO2 28 22 - 32 mmol/L   Glucose, Bld 109 (H) 65 - 99 mg/dL   BUN 13 6 - 20 mg/dL   Creatinine, Ser 0.68 0.61 - 1.24 mg/dL   Calcium 9.4 8.9 - 10.3 mg/dL   Total Protein 7.2 6.5 - 8.1 g/dL   Albumin 4.1 3.5 - 5.0 g/dL   AST 20 15 - 41 U/L   ALT 19 17 - 63 U/L   Alkaline Phosphatase 70 38 - 126 U/L   Total Bilirubin 0.7 0.3 - 1.2 mg/dL   GFR calc non Af Amer >60 >60 mL/min   GFR calc Af Amer >60 >60 mL/min   Anion gap 5 5 - 15  CBC  Result Value Ref Range   WBC 9.1 3.8 - 10.6 K/uL   RBC 4.61 4.40 - 5.90 MIL/uL   Hemoglobin 15.3 13.0 - 18.0 g/dL   HCT 44.8 40.0 - 52.0 %   MCV 97.1 80.0 - 100.0 fL   MCH 33.2 26.0 - 34.0 pg   MCHC 34.2 32.0 - 36.0 g/dL   RDW 13.0 11.5 - 14.5 %   Platelets 156 150 -  440 K/uL  Protime-INR  Result Value Ref Range   Prothrombin Time 13.2 11.4 - 15.2 seconds   INR 1.00   Troponin I  Result Value Ref Range   Troponin I <0.03 <0.03 ng/mL  Basic metabolic panel  Result Value Ref Range   Sodium 143 135 - 145 mmol/L   Potassium 3.9 3.5 - 5.1 mmol/L   Chloride 110 101 - 111 mmol/L   CO2 28 22 - 32 mmol/L   Glucose, Bld 99 65 - 99 mg/dL   BUN 12 6 - 20 mg/dL   Creatinine, Ser 0.75 0.61 - 1.24 mg/dL   Calcium 9.1 8.9 - 10.3 mg/dL   GFR calc non Af Amer >60 >60 mL/min   GFR calc Af Amer >60 >60 mL/min   Anion gap 5 5 - 15  CBC  Result Value Ref Range   WBC 8.8 3.8 - 10.6 K/uL   RBC 4.28 (L) 4.40 - 5.90 MIL/uL   Hemoglobin 14.2 13.0 - 18.0 g/dL   HCT 41.1 40.0 - 52.0 %   MCV 96.2 80.0 - 100.0 fL   MCH 33.2 26.0 - 34.0 pg   MCHC 34.5 32.0 - 36.0 g/dL   RDW 13.5 11.5 - 14.5 %   Platelets 145 (L) 150 - 440  K/uL  Hemoglobin  Result Value Ref Range   Hemoglobin 14.2 13.0 - 18.0 g/dL  Hemoglobin  Result Value Ref Range   Hemoglobin 14.2 13.0 - 18.0 g/dL  Echocardiogram  Result Value Ref Range   Weight 2,473.6 oz   Height 68 in   BP 117/81 mmHg  Type and screen [place]  Result Value Ref Range   ABO/RH(D) A POS    Antibody Screen NEG    Sample Expiration 10/31/2016       Assessment & Plan:   Problem List Items Addressed This Visit      Cardiovascular and Mediastinum   HTN (hypertension)    Stable. Continue current regimen. Continue to monitor. Call with any concerns.       Relevant Medications   atorvastatin (LIPITOR) 80 MG tablet   Other Relevant Orders   Comprehensive metabolic panel   Microalbumin, Urine Waived   AAA (abdominal aortic aneurysm) ([place])    Has been having his US's done. Due again in 09/2018. Continue to monitor.       Relevant Medications   atorvastatin (LIPITOR) 80 MG tablet   CAD (coronary artery disease)    Continue to follow with cardiology. Call with any concerns.       Relevant  Medications   atorvastatin (LIPITOR) 80 MG tablet   HFrEF (heart failure with reduced ejection fraction) ([place])    Continue to follow with cardiology. Call with any concerns.       Relevant Medications   atorvastatin (LIPITOR) 80 MG tablet     Digestive   GERD (gastroesophageal reflux disease)    Stable. Continue current regimen. Continue to monitor. Call with any concerns.       Relevant Orders   CBC with Differential/Platelet   Comprehensive metabolic panel   UA/M w/rflx Culture, Routine     Musculoskeletal and Integument   Arthritis    Follows with Dr. Humphrey Rolls. Call with any concerns.         Genitourinary   Renal stones    No issues at this time. Will check urine. Await results. Call with any concerns.         Other   Anxiety and depression    Following with Dr. Stephanie Coup for pain and anxiety. Stable. Continue to follow with him.       Relevant Orders   TSH   Hyperlipidemia    Rechecking levels today. Await results.       Relevant Medications   atorvastatin (LIPITOR) 80 MG tablet   Other Relevant Orders   Comprehensive metabolic panel   Lipid Panel w/o Chol/HDL Ratio   History of GI diverticular bleed    Advised him to follow up with GI- call with any concerns.       Encounter for long-term (current) use of antiplatelets/antithrombotics    Checking CBC today. Continue to monitor.       Malnutrition of moderate degree ([place])    Continue to work on diet and exercise. Weight stable.        Other Visit Diagnoses    Medicare annual wellness visit, subsequent    -  Primary   Preventative care discussed as below.    Urinary frequency       Checking PSA today. Await results.    Relevant Orders   PSA   Psoriasis       Will start triamcinalone. Let us know if not getting better or getting worse.    Immunization due  Prevnar given today   Relevant Orders   Pneumococcal conjugate vaccine 13-valent (Completed)       Preventative Services:  Health Risk  Assessment and Personalized Prevention Plan: Done today Bone Mass Measurements: N/A CVD Screening: Done today- done at cardiology Colon Cancer Screening: N/A Depression Screening: Done today Diabetes Screening: Done today Glaucoma Screening: See your eye doctor Hepatitis B vaccine: N/A Hepatitis C screening: N/A HIV Screening: N/A Flu Vaccine: up to date Lung cancer Screening: N/A Obesity Screening: Done today  Pneumonia Vaccines (2): Prevnar given today- pneumovax in 1 year STI Screening: N/A PSA screening: Done today  Discussed aspirin prophylaxis for myocardial infarction prevention and decision was made to continue ASA  LABORATORY TESTING:  Health maintenance labs ordered today as discussed above.   The natural history of prostate cancer and ongoing controversy regarding screening and potential treatment outcomes of prostate cancer has been discussed with the patient. The meaning of a false positive PSA and a false negative PSA has been discussed. He indicates understanding of the limitations of this screening test and wishes to proceed with screening PSA testing.   IMMUNIZATIONS:   - Tdap: Tetanus vaccination status reviewed: last tetanus booster within 10 years. - Influenza: Up to date - Pneumovax: Not applicable - Prevnar: Administered today - Zostavax vaccine: Not applicable  SCREENING: - Colonoscopy: Ordered today  Discussed with patient purpose of the colonoscopy is to detect colon cancer at curable precancerous or early stages   - AAA [place]: Up to date  -Hearing Test: Ordered today  -Spirometry: Not applicable   PATIENT COUNSELING:    Sexuality: Discussed sexually transmitted diseases, partner selection, use of condoms, avoidance of unintended pregnancy  and contraceptive alternatives.   Advised to avoid cigarette smoking.  I discussed with the patient that most people either abstain from alcohol or drink within safe limits (<=14/week and <=4  drinks/occasion for males, <=7/weeks and <= 3 drinks/occasion for females) and that the risk for alcohol disorders and other health effects rises proportionally with the number of drinks per week and how often a drinker exceeds daily limits.  Discussed cessation/primary prevention of drug use and availability of treatment for abuse.   Diet: Encouraged to adjust caloric intake to maintain  or achieve ideal body weight, to reduce intake of dietary saturated fat and total fat, to limit sodium intake by avoiding high sodium foods and not adding table salt, and to maintain adequate dietary potassium and calcium preferably from fresh fruits, vegetables, and low-fat dairy products.    stressed the importance of regular exercise  Injury prevention: Discussed safety belts, safety helmets, smoke detector, smoking near bedding or upholstery.   Dental health: Discussed importance of regular tooth brushing, flossing, and dental visits.   Follow up plan: NEXT PREVENTATIVE PHYSICAL DUE IN 1 YEAR. Return in about 6 months (around 06/17/2017) for Follow up cholesterol.

## 2016-12-18 NOTE — Assessment & Plan Note (Signed)
Following with Dr. Stephanie Coup for pain and anxiety. Stable. Continue to follow with him.

## 2016-12-18 NOTE — Patient Instructions (Addendum)
Preventative Services:  Health Risk Assessment and Personalized Prevention Plan: Done today Bone Mass Measurements: N/A CVD Screening: Done today- done at cardiology Colon Cancer Screening: N/A Depression Screening: Done today Diabetes Screening: Done today Glaucoma Screening: See your eye doctor Hepatitis B vaccine: N/A Hepatitis C screening: N/A HIV Screening: N/A Flu Vaccine: up to date Lung cancer Screening: N/A Obesity Screening: Done today  Pneumonia Vaccines (2): Prevnar given today- pneumovax in 1 year STI Screening: N/A PSA screening: Done today Health Maintenance, Male A healthy lifestyle and preventative care can promote health and wellness.  Maintain regular health, dental, and eye exams.  Eat a healthy diet. Foods like vegetables, fruits, whole grains, low-fat dairy products, and lean protein foods contain the nutrients you need and are low in calories. Decrease your intake of foods high in solid fats, added sugars, and salt. Get information about a proper diet from your health care provider, if necessary.  Regular physical exercise is one of the most important things you can do for your health. Most adults should get at least 150 minutes of moderate-intensity exercise (any activity that increases your heart rate and causes you to sweat) each week. In addition, most adults need muscle-strengthening exercises on 2 or more days a week.   Maintain a healthy weight. The body mass index (BMI) is a screening tool to identify possible weight problems. It provides an estimate of body fat based on height and weight. Your health care provider can find your BMI and can help you achieve or maintain a healthy weight. For males 20 years and older:  A BMI below 18.5 is considered underweight.  A BMI of 18.5 to 24.9 is normal.  A BMI of 25 to 29.9 is considered overweight.  A BMI of 30 and above is considered obese.  Maintain normal blood lipids and cholesterol by exercising and  minimizing your intake of saturated fat. Eat a balanced diet with plenty of fruits and vegetables. Blood tests for lipids and cholesterol should begin at age 39 and be repeated every 5 years. If your lipid or cholesterol levels are high, you are over age 69, or you are at high risk for heart disease, you may need your cholesterol levels checked more frequently.Ongoing high lipid and cholesterol levels should be treated with medicines if diet and exercise are not working.  If you smoke, find out from your health care provider how to quit. If you do not use tobacco, do not start.  Lung cancer screening is recommended for adults aged 69-80 years who are at high risk for developing lung cancer because of a history of smoking. A yearly low-dose CT scan of the lungs is recommended for people who have at least a 30-pack-year history of smoking and are current smokers or have quit within the past 15 years. A pack year of smoking is smoking an average of 1 pack of cigarettes a day for 1 year (for example, a 30-pack-year history of smoking could mean smoking 1 pack a day for 30 years or 2 packs a day for 15 years). Yearly screening should continue until the smoker has stopped smoking for at least 15 years. Yearly screening should be stopped for people who develop a health problem that would prevent them from having lung cancer treatment.  If you choose to drink alcohol, do not have more than 2 drinks per day. One drink is considered to be 12 oz (360 mL) of beer, 5 oz (150 mL) of wine, or 1.5 oz (45  mL) of liquor.  Avoid the use of street drugs. Do not share needles with anyone. Ask for help if you need support or instructions about stopping the use of drugs.  High blood pressure causes heart disease and increases the risk of stroke. High blood pressure is more likely to develop in:  People who have blood pressure in the end of the normal range (100-139/85-89 mm Hg).  People who are overweight or  obese.  People who are African American.  If you are 64-97 years of age, have your blood pressure checked every 3-5 years. If you are 56 years of age or older, have your blood pressure checked every year. You should have your blood pressure measured twice-once when you are at a hospital or clinic, and once when you are not at a hospital or clinic. Record the average of the two measurements. To check your blood pressure when you are not at a hospital or clinic, you can use:  An automated blood pressure machine at a pharmacy.  A home blood pressure monitor.  If you are 89-65 years old, ask your health care provider if you should take aspirin to prevent heart disease.  Diabetes screening involves taking a blood sample to check your fasting blood sugar level. This should be done once every 3 years after age 44 if you are at a normal weight and without risk factors for diabetes. Testing should be considered at a younger age or be carried out more frequently if you are overweight and have at least 1 risk factor for diabetes.  Colorectal cancer can be detected and often prevented. Most routine colorectal cancer screening begins at the age of 46 and continues through age 40. However, your health care provider may recommend screening at an earlier age if you have risk factors for colon cancer. On a yearly basis, your health care provider may provide home test kits to check for hidden blood in the stool. A small camera at the end of a tube may be used to directly examine the colon (sigmoidoscopy or colonoscopy) to detect the earliest forms of colorectal cancer. Talk to your health care provider about this at age 52 when routine screening begins. A direct exam of the colon should be repeated every 5-10 years through age 48, unless early forms of precancerous polyps or small growths are found.  People who are at an increased risk for hepatitis B should be screened for this virus. You are considered at high risk  for hepatitis B if:  You were born in a country where hepatitis B occurs often. Talk with your health care provider about which countries are considered high risk.  Your parents were born in a high-risk country and you have not received a shot to protect against hepatitis B (hepatitis B vaccine).  You have HIV or AIDS.  You use needles to inject street drugs.  You live with, or have sex with, someone who has hepatitis B.  You are a man who has sex with other men (MSM).  You get hemodialysis treatment.  You take certain medicines for conditions like cancer, organ transplantation, and autoimmune conditions.  Hepatitis C blood testing is recommended for all people born from [birth year] through 1963 and any individual with known risk factors for hepatitis C.  Healthy men should no longer receive prostate-specific antigen (PSA) blood tests as part of routine cancer screening. Talk to your health care provider about prostate cancer screening.  Testicular cancer screening is not recommended for adolescents  or adult males who have no symptoms. Screening includes self-exam, a health care provider exam, and other screening tests. Consult with your health care provider about any symptoms you have or any concerns you have about testicular cancer.  Practice safe sex. Use condoms and avoid high-risk sexual practices to reduce the spread of sexually transmitted infections (STIs).  You should be screened for STIs, including gonorrhea and chlamydia if:  You are sexually active and are younger than 24 years.  You are older than 24 years, and your health care provider tells you that you are at risk for this type of infection.  Your sexual activity has changed since you were last screened, and you are at an increased risk for chlamydia or gonorrhea. Ask your health care provider if you are at risk.  If you are at risk of being infected with HIV, it is recommended that you take a prescription medicine daily to  prevent HIV infection. This is called pre-exposure prophylaxis (PrEP). You are considered at risk if:  You are a man who has sex with other men (MSM).  You are a heterosexual man who is sexually active with multiple partners.  You take drugs by injection.  You are sexually active with a partner who has HIV.  Talk with your health care provider about whether you are at high risk of being infected with HIV. If you choose to begin PrEP, you should first be tested for HIV. You should then be tested every 3 months for as long as you are taking PrEP.  Use sunscreen. Apply sunscreen liberally and repeatedly throughout the day. You should seek shade when your shadow is shorter than you. Protect yourself by wearing long sleeves, pants, a wide-brimmed hat, and sunglasses year round whenever you are outdoors.  Tell your health care provider of new moles or changes in moles, especially if there is a change in shape or color. Also, tell your health care provider if a mole is larger than the size of a pencil eraser.  A one-time screening for abdominal aortic aneurysm (AAA) and surgical repair of large AAAs by ultrasound is recommended for men aged 22-75 years who are current or former smokers.  Stay current with your vaccines (immunizations). This information is not intended to replace advice given to you by your health care provider. Make sure you discuss any questions you have with your health care provider. Document Released: 04/27/2008 Document Revised: 11/20/2014 Document Reviewed: 08/03/2015 Elsevier Interactive Patient Education  2017 [place] Directive Advance directives are the legal documents that allow you to make choices about your health care and medical treatment if you cannot speak for yourself. Advance directives are a way for you to communicate your wishes to family, friends, and health care providers. The specified people can then convey your decisions about end-of-life care  to avoid confusion if you should become unable to communicate. Ideally, the process of discussing and writing advance directives should happen over time rather than making decisions all at once. Advance directives can be modified as your situation changes, and you can change your mind at any time, even after you have signed the advance directives. Each state has its own laws regarding advance directives. You may want to check with your health care provider, attorney, or state representative about the law in your state. Below are some examples of advance directives. HEALTH CARE PROXY AND DURABLE POWER OF ATTORNEY FOR HEALTH CARE A health care proxy is a person (agent) appointed to make  medical decisions for you if you cannot. Generally, people choose someone they know well and trust to represent their preferences when they can no longer do so. You should be sure to ask this person for agreement to act as your agent. An agent may have to exercise judgment in the event of a medical decision for which your wishes are not known. A durable power of attorney for health care is a legal document that names your health care proxy. Depending on the laws in your state, after the document is written, it may also need to be:  Signed.  Notarized.  Dated.  Copied.  Witnessed.  Incorporated into your medical record. You may also want to appoint someone to manage your financial affairs if you cannot. This is called a durable power of attorney for finances. It is a separate legal document from the durable power of attorney for health care. You may choose the same person or someone different from your health care proxy to act as your agent in financial matters. LIVING WILL A living will is a set of instructions documenting your wishes about medical care when you cannot care for yourself. It is used if you become:  Terminally ill.  Incapacitated.  Unable to communicate.  Unable to make decisions. Items to  consider in your living will include:  The use or non-use of life-sustaining equipment, such as dialysis machines and breathing machines (ventilators).  A do not resuscitate (DNR) order, which is the instruction not to use cardiopulmonary resuscitation (CPR) if breathing or heartbeat stops.  Tube feeding.  Withholding of food and fluids.  Comfort (palliative) care when the goal becomes comfort rather than a cure.  Organ and tissue donation. A living will does not give instructions about distribution of your money and property if you should pass away. It is advisable to seek the expert advice of a lawyer in drawing up a will regarding your possessions. Decisions about taxes, beneficiaries, and asset distribution will be legally binding. This process can relieve your family and friends of any burdens surrounding disputes or questions that may come up about the allocation of your assets. DO NOT RESUSCITATE (DNR) A do not resuscitate (DNR) order is a request to not have CPR in the event that your heart stops beating or you stop breathing. Unless given other instructions, a health care provider will try to help any patient whose heart has stopped or who has stopped breathing.  This information is not intended to replace advice given to you by your health care provider. Make sure you discuss any questions you have with your health care provider. Document Released: 02/06/2008 Document Revised: 02/21/2016 Document Reviewed: 03/19/2013 Elsevier Interactive Patient Education  2017 [employer] Pneumococcal Conjugate Vaccine (PCV13) What You Need to Know 1. Why get vaccinated? Vaccination can protect both children and adults from pneumococcal disease. Pneumococcal disease is caused by bacteria that can spread from person to person through close contact. It can cause ear infections, and it can also lead to more serious infections of the:  Lungs (pneumonia),  Blood (bacteremia), and  Covering of the  brain and spinal cord (meningitis). Pneumococcal pneumonia is most common among adults. Pneumococcal meningitis can cause deafness and brain damage, and it kills about 1 child in 10 who get it. Anyone can get pneumococcal disease, but children under 83 years of age and adults 24 years and older, people with certain medical conditions, and cigarette smokers are at the highest risk. Before there was a vaccine,  the [place] saw:  more than 700 cases of meningitis,  about 13,000 blood infections,  about 5 million ear infections, and  about 200 deaths in children under 5 each year from pneumococcal disease. Since vaccine became available, severe pneumococcal disease in these children has fallen by 88%. About 18,000 older adults die of pneumococcal disease each year in the [place]. Treatment of pneumococcal infections with penicillin and other drugs is not as effective as it used to be, because some strains of the disease have become resistant to these drugs. This makes prevention of the disease, through vaccination, even more important. 2. PCV13 vaccine Pneumococcal conjugate vaccine (called PCV13) protects against 13 types of pneumococcal bacteria. PCV13 is routinely given to children at 2, 4, 6, and 63-58 months of age. It is also recommended for children and adults 65 to 78 years of age with certain health conditions, and for all adults 68 years of age and older. Your doctor can give you details. 3. Some people should not get this vaccine Anyone who has ever had a life-threatening allergic reaction to a dose of this vaccine, to an earlier pneumococcal vaccine called PCV7, or to any vaccine containing diphtheria toxoid (for example, DTaP), should not get PCV13. Anyone with a severe allergy to any component of PCV13 should not get the vaccine. Tell your doctor if the person being vaccinated has any severe allergies. If the person scheduled for vaccination is not feeling well, your  healthcare provider might decide to reschedule the shot on another day. 4. Risks of a vaccine reaction With any medicine, including vaccines, there is a chance of reactions. These are usually mild and go away on their own, but serious reactions are also possible. Problems reported following PCV13 varied by age and dose in the series. The most common problems reported among children were:  About half became drowsy after the shot, had a temporary loss of appetite, or had redness or tenderness where the shot was given.  About 1 out of 3 had swelling where the shot was given.  About 1 out of 3 had a mild fever, and about 1 in 20 had a fever over 102.1F.  Up to about 8 out of 10 became fussy or irritable. Adults have reported pain, redness, and swelling where the shot was given; also mild fever, fatigue, headache, chills, or muscle pain. Young children who get PCV13 along with inactivated flu vaccine at the same time may be at increased risk for seizures caused by fever. Ask your doctor for more information. Problems that could happen after any vaccine:  People sometimes faint after a medical procedure, including vaccination. Sitting or lying down for about 15 minutes can help prevent fainting, and injuries caused by a fall. Tell your doctor if you feel dizzy, or have vision changes or ringing in the ears.  Some older children and adults get severe pain in the shoulder and have difficulty moving the arm where a shot was given. This happens very rarely.  Any medication can cause a severe allergic reaction. Such reactions from a vaccine are very rare, estimated at about 1 in a million doses, and would happen within a few minutes to a few hours after the vaccination. As with any medicine, there is a very small chance of a vaccine causing a serious injury or death. The safety of vaccines is always being monitored. For more information, visit: http://www.aguilar.org/ 5. What if there is a serious  reaction? What should I look for? Look  for anything that concerns you, such as signs of a severe allergic reaction, very high fever, or unusual behavior. Signs of a severe allergic reaction can include hives, swelling of the face and throat, difficulty breathing, a fast heartbeat, dizziness, and weakness-usually within a few minutes to a few hours after the vaccination. What should I do?  If you think it is a severe allergic reaction or other emergency that can't wait, call 9-1-1 or get the person to the nearest hospital. Otherwise, call your doctor.  Reactions should be reported to the Vaccine Adverse Event Reporting System (VAERS). Your doctor should file this report, or you can do it yourself through the VAERS web site at www.vaers.SamedayNews.es, or by calling 860-251-7638.  VAERS does not give medical advice. 6. The National Vaccine Injury Compensation Program The [employer] Vaccine Injury Compensation Program (VICP) is a federal program that was created to compensate people who may have been injured by certain vaccines. Persons who believe they may have been injured by a vaccine can learn about the program and about filing a claim by calling 6841708248 or visiting the [place] website at GoldCloset.com.ee. There is a time limit to file a claim for compensation. 7. How can I learn more?  Ask your healthcare provider. He or she can give you the vaccine package insert or suggest other sources of information.  Call your local or state health department.  Contact the Centers for Disease Control and Prevention (CDC):  Call (615) 805-0714 (1-800-CDC-INFO) or  Visit CDC's website at http://hunter.com/ Vaccine Information Statement, PCV13 Vaccine (09/17/2014) This information is not intended to replace advice given to you by your health care provider. Make sure you discuss any questions you have with your health care provider. Document Released: 08/27/2006 Document Revised:  07/20/2016 Document Reviewed: 07/20/2016 Elsevier Interactive Patient Education  2017 [employer].

## 2016-12-18 NOTE — Assessment & Plan Note (Signed)
Advised him to follow up with GI- call with any concerns.

## 2016-12-19 ENCOUNTER — Encounter: Payer: Self-pay | Admitting: Family Medicine

## 2016-12-19 LAB — MICROALBUMIN, URINE WAIVED
CREATININE, URINE WAIVED: 50 mg/dL (ref 10–300)
MICROALB, UR WAIVED: 10 mg/L (ref 0–19)
Microalb/Creat Ratio: <30 mg/g (ref <30)

## 2016-12-19 LAB — UA/M W/RFLX CULTURE, ROUTINE
Bilirubin, UA: NEGATIVE
GLUCOSE, UA: NEGATIVE
Ketones, UA: NEGATIVE
Leukocytes, UA: NEGATIVE
Nitrite, UA: NEGATIVE
Protein, UA: NEGATIVE
RBC UA: NEGATIVE
Specific Gravity, UA: 1.015 (ref 1.005–1.030)
UUROB: 0.2 mg/dL (ref 0.2–1.0)
pH, UA: 7 (ref 5.0–7.5)

## 2016-12-19 LAB — COMPREHENSIVE METABOLIC PANEL
A/G RATIO: 1.6 (ref 1.2–2.2)
ALBUMIN: 4.3 g/dL (ref 3.5–4.7)
ALT: 18 IU/L (ref 0–44)
AST: 18 IU/L (ref 0–40)
Alkaline Phosphatase: 79 IU/L (ref 39–117)
BUN/Creatinine Ratio: 14 (ref 10–24)
BUN: 11 mg/dL (ref 8–27)
Bilirubin Total: 0.4 mg/dL (ref 0.0–1.2)
CALCIUM: 9.4 mg/dL (ref 8.6–10.2)
CO2: 24 mmol/L (ref 18–29)
Chloride: 103 mmol/L (ref 96–106)
Creatinine, Ser: 0.78 mg/dL (ref 0.76–1.27)
GFR, EST AFRICAN AMERICAN: 98 mL/min/{1.73_m2} (ref >59)
GFR, EST NON AFRICAN AMERICAN: 85 mL/min/{1.73_m2} (ref >59)
GLOBULIN, TOTAL: 2.7 g/dL (ref 1.5–4.5)
Glucose: 95 mg/dL (ref 65–99)
POTASSIUM: 3.9 mmol/L (ref 3.5–5.2)
SODIUM: 144 mmol/L (ref 134–144)
TOTAL PROTEIN: 7 g/dL (ref 6.0–8.5)

## 2016-12-19 LAB — CBC WITH DIFFERENTIAL/PLATELET
BASOS: 0 %
Basophils Absolute: 0 10*3/uL (ref 0.0–0.2)
EOS (ABSOLUTE): 0.1 10*3/uL (ref 0.0–0.4)
EOS: 1 %
HEMATOCRIT: 42.7 % (ref 37.5–51.0)
HEMOGLOBIN: 14.8 g/dL (ref 13.0–17.7)
IMMATURE GRANS (ABS): 0 10*3/uL (ref 0.0–0.1)
IMMATURE GRANULOCYTES: 0 %
Lymphocytes Absolute: 1.9 10*3/uL (ref 0.7–3.1)
Lymphs: 27 %
MCH: 32.7 pg (ref 26.6–33.0)
MCHC: 34.7 g/dL (ref 31.5–35.7)
MCV: 95 fL (ref 79–97)
MONOCYTES: 6 %
MONOS ABS: 0.4 10*3/uL (ref 0.1–0.9)
NEUTROS PCT: 66 %
Neutrophils Absolute: 4.5 10*3/uL (ref 1.4–7.0)
Platelets: 169 10*3/uL (ref 150–379)
RBC: 4.52 x10E6/uL (ref 4.14–5.80)
RDW: 13.8 % (ref 12.3–15.4)
WBC: 6.9 10*3/uL (ref 3.4–10.8)

## 2016-12-19 LAB — LIPID PANEL W/O CHOL/HDL RATIO
Cholesterol, Total: 133 mg/dL (ref 100–199)
HDL: 68 mg/dL (ref >39)
LDL Calculated: 54 mg/dL (ref 0–99)
TRIGLYCERIDES: 53 mg/dL (ref 0–149)
VLDL Cholesterol Cal: 11 mg/dL (ref 5–40)

## 2016-12-19 LAB — TSH: TSH: 2.1 u[IU]/mL (ref 0.450–4.500)

## 2016-12-19 LAB — PSA: PROSTATE SPECIFIC AG, SERUM: 1 ng/mL (ref 0.0–4.0)

## 2017-01-15 DIAGNOSIS — E785 Hyperlipidemia, unspecified: Secondary | ICD-10-CM | POA: Diagnosis not present

## 2017-01-15 DIAGNOSIS — I10 Essential (primary) hypertension: Secondary | ICD-10-CM | POA: Diagnosis not present

## 2017-01-15 DIAGNOSIS — I351 Nonrheumatic aortic (valve) insufficiency: Secondary | ICD-10-CM | POA: Diagnosis not present

## 2017-01-15 DIAGNOSIS — I2510 Atherosclerotic heart disease of native coronary artery without angina pectoris: Secondary | ICD-10-CM | POA: Diagnosis not present

## 2017-01-18 ENCOUNTER — Telehealth: Payer: Self-pay | Admitting: Internal Medicine

## 2017-01-18 NOTE — Telephone Encounter (Signed)
Pts wife states that about 3-4 hours ago the pt felt that he needed to have a bowel movement but states only BRB came out. Reports this has happened to him in the past and she took him to the ER. Wife wants to know what they should do, she did not want to take him back to the hospital. There are no APP appts available. Please advise.

## 2017-01-18 NOTE — Telephone Encounter (Signed)
Pt with hx of post-polypectomy bleeding 2 yrs ago.  This is obviously not the cause now He does have known diverticulosis which can cause, usually painless, bleeding from the colon.  This can be large volume and result in hemodynamic instability. He is also on Plavix which can make bleeding more brisk I recommend that he has any further rectal bleeding, he needs to call 911 or go to the ER immediately APP next available, but to the ER if any further bleeding

## 2017-01-18 NOTE — Telephone Encounter (Signed)
Spoke with pt and she is aware. Pt scheduled for 02/05/17@3pm . Pts wife aware and knows to go to the ER if pt has more bleeding.

## 2017-01-26 ENCOUNTER — Ambulatory Visit: Payer: Medicare Other | Admitting: Gastroenterology

## 2017-01-29 ENCOUNTER — Ambulatory Visit: Payer: Medicare Other | Admitting: Physician Assistant

## 2017-03-06 ENCOUNTER — Encounter: Payer: Self-pay | Admitting: Internal Medicine

## 2017-03-06 ENCOUNTER — Ambulatory Visit (INDEPENDENT_AMBULATORY_CARE_PROVIDER_SITE_OTHER): Payer: Medicare Other | Admitting: Internal Medicine

## 2017-03-06 ENCOUNTER — Other Ambulatory Visit (INDEPENDENT_AMBULATORY_CARE_PROVIDER_SITE_OTHER): Payer: Medicare Other

## 2017-03-06 VITALS — BP 102/60 | HR 68 | Ht 66.5 in | Wt 152.6 lb

## 2017-03-06 DIAGNOSIS — I2510 Atherosclerotic heart disease of native coronary artery without angina pectoris: Secondary | ICD-10-CM

## 2017-03-06 DIAGNOSIS — R1013 Epigastric pain: Secondary | ICD-10-CM

## 2017-03-06 DIAGNOSIS — K5731 Diverticulosis of large intestine without perforation or abscess with bleeding: Secondary | ICD-10-CM

## 2017-03-06 LAB — CBC WITH DIFFERENTIAL/PLATELET
BASOS ABS: 0 10*3/uL (ref 0.0–0.1)
Basophils Relative: 0.6 % (ref 0.0–3.0)
EOS ABS: 0.1 10*3/uL (ref 0.0–0.7)
Eosinophils Relative: 1.6 % (ref 0.0–5.0)
HCT: 36.8 % — ABNORMAL LOW (ref 39.0–52.0)
Hemoglobin: 12.2 g/dL — ABNORMAL LOW (ref 13.0–17.0)
Lymphocytes Relative: 31.5 % (ref 12.0–46.0)
Lymphs Abs: 2.3 10*3/uL (ref 0.7–4.0)
MCHC: 33.2 g/dL (ref 30.0–36.0)
MCV: 91.8 fl (ref 78.0–100.0)
MONOS PCT: 11.1 % (ref 3.0–12.0)
Monocytes Absolute: 0.8 10*3/uL (ref 0.1–1.0)
NEUTROS ABS: 4.1 10*3/uL (ref 1.4–7.7)
NEUTROS PCT: 55.2 % (ref 43.0–77.0)
PLATELETS: 183 10*3/uL (ref 150.0–400.0)
RBC: 4.01 Mil/uL — ABNORMAL LOW (ref 4.22–5.81)
RDW: 14.5 % (ref 11.5–15.5)
WBC: 7.4 10*3/uL (ref 4.0–10.5)

## 2017-03-06 MED ORDER — RANITIDINE HCL 150 MG PO TABS: 150.0000 mg | ORAL_TABLET | Freq: Two times a day (BID) | ORAL | 4 refills | Status: DC

## 2017-03-06 MED ORDER — ONDANSETRON 4 MG PO TBDP: 4.0000 mg | ORAL_TABLET | Freq: Four times a day (QID) | ORAL | 0 refills | Status: DC | PRN

## 2017-03-06 NOTE — Patient Instructions (Signed)
Your physician has requested that you go to the basement for the following lab work before leaving today: CBC  We have sent the following medications to your pharmacy for you to pick up at your convenience: Zantac 150 mg twice daily Zofran 4 mg every 6 hours as needed  Please follow up with Dr [name] in 6 months.  If you are age 81 or older, your body mass index should be between 23-30. Your Body mass index is 24.26 kg/m. If this is out of the aforementioned range listed, please consider follow up with your Primary Care Provider.  If you are age 13 or younger, your body mass index should be between 19-25. Your Body mass index is 24.26 kg/m. If this is out of the aformentioned range listed, please consider follow up with your Primary Care Provider.

## 2017-03-06 NOTE — Progress Notes (Signed)
Subjective:    Patient ID: Brian [name], male    DOB: September 15, 1933, 83 y.o.   MRN: [id number]  HPI Brian [name] is an 83 year old male with a history of adenomatous colon polyps status post polypectomy complicated by post-polypectomy GI bleeding in the setting of anticoagulation, history of alcohol abuse in remission, history of H. pylori gastritis status post treatment, history of acquired duodenal stenosis with history of peptic ulcer disease, AAA, hypertension, hyperlipidemia and CAD on Plavix is here for follow-up. He is here with his wife. He was last seen in the office in September 2016.  He reports on the whole he's been doing fairly well. He was hospitalized in December 2017 for observation for hematochezia. This stopped on its own without intervention and was felt secondary to diverticulosis. Blood counts have normalized and were normal in February when checked. He had an episode lasting about 36 hours of intermittent hematochezia, painless in nature, in March 2018. He described this as maroon blood and clots occurring several times over 36 hour period. Again this was painless in nature. Resolved spontaneously. Since then no further bleeding. Some mild intermittent constipation. Occasionally when he eats he will have a mid to upper abdominal discomfort associated with nausea. No vomiting. No weight loss. He is not taking any acid suppression therapy. He does continue Plavix daily.  Review of Systems As per history of present illness, otherwise negative  Current Medications, Allergies, Past Medical History, Past Surgical History, Family History and Social History were reviewed in [employer] record.     Objective:   Physical Exam BP 102/60   Pulse 68   Ht 5' 6.5" (1.689 m)   Wt 152 lb 9.6 oz (69.2 kg)   SpO2 98%   BMI 24.26 kg/m  Constitutional: Well-developed and well-nourished. No distress. HEENT: Normocephalic and atraumatic. Oropharynx is clear and moist.  Conjunctivae are normal.  No scleral icterus. Neck: Neck supple. Trachea midline. Cardiovascular: Normal rate, regular rhythm and intact distal pulses. 2/6 SEM Pulmonary/chest: Effort normal and breath sounds normal. No wheezing, rales or rhonchi. Abdominal: Soft, nontender, nondistended. Bowel sounds active throughout. Extremities: no clubbing, cyanosis, or edema Neurological: Alert and oriented to person place and time. Skin: Skin is warm and dry. Psychiatric: Normal mood and affect. Behavior is normal.  CBC ordered today    Assessment & Plan:  83 year old male with a history of adenomatous colon polyps status post polypectomy complicated by post-polypectomy GI bleeding in the setting of anticoagulation, history of alcohol abuse in remission, history of H. pylori gastritis status post treatment, history of acquired duodenal stenosis with history of peptic ulcer disease, AAA, hypertension, hyperlipidemia and CAD on Plavix is here for follow-up.  1. Resolved hematochezia/history of colonic diverticulosis -- hematochezia in December 2017 and less significantly in March 2018 was likely spontaneous diverticular hemorrhage. The most recent time he did not seek care. Check CBC today to ensure there is no progressive anemia. He looks well today and has had no recent bleeding. We discussed diverticular hemorrhage and how this is rather unavoidable. I did encourage a high-fiber diet. Certainly any bleeding will be exacerbated in the setting of Plavix. If he develops recurrent hematochezia he is asked to notify me immediately and if symptomatic in any way to call 911 to be taken to the ER. He voiced understanding.  2. Dyspeptic mid abdominal pain with nausea -- intermittent and unpredictable. He has a history of gastritis. He is no longer drinking alcohol. He also has a  history of acquired duodenal stenosis which improved with acid suppression therapy. My suspicion is for dyspepsia rather than upper GI  obstruction or partial obstruction symptom. I will have him use ranitidine 150 with or without Zofran 4 mg on an as-needed basis for upper abdominal pain when this occurs. Both he and his wife agree that this is quite sporadic and recently rare. Me if the symptom worsens or becomes more frequent.  3. History of colon polyps -- he does have a history of adenomatous colon polyps. We have discussed rsurveillance colonoscopy versus discontinuation of screening based on age. He leans towards discontinuation of screening which is reasonable.  Return in 6 months, sooner if necessary 25 minutes spent with the patient today. Greater than 50% was spent in counseling and coordination of care with the patient

## 2017-04-18 ENCOUNTER — Other Ambulatory Visit: Payer: Self-pay | Admitting: Internal Medicine

## 2017-05-28 DIAGNOSIS — I351 Nonrheumatic aortic (valve) insufficiency: Secondary | ICD-10-CM | POA: Diagnosis not present

## 2017-05-28 DIAGNOSIS — I10 Essential (primary) hypertension: Secondary | ICD-10-CM | POA: Diagnosis not present

## 2017-05-28 DIAGNOSIS — E785 Hyperlipidemia, unspecified: Secondary | ICD-10-CM | POA: Diagnosis not present

## 2017-05-28 DIAGNOSIS — I2510 Atherosclerotic heart disease of native coronary artery without angina pectoris: Secondary | ICD-10-CM | POA: Diagnosis not present

## 2017-06-19 ENCOUNTER — Encounter: Payer: Self-pay | Admitting: Family Medicine

## 2017-06-19 ENCOUNTER — Ambulatory Visit (INDEPENDENT_AMBULATORY_CARE_PROVIDER_SITE_OTHER): Payer: Medicare Other | Admitting: Family Medicine

## 2017-06-19 VITALS — BP 132/87 | HR 77 | Temp 97.5°F | Wt 152.3 lb

## 2017-06-19 DIAGNOSIS — K219 Gastro-esophageal reflux disease without esophagitis: Secondary | ICD-10-CM | POA: Diagnosis not present

## 2017-06-19 DIAGNOSIS — I2510 Atherosclerotic heart disease of native coronary artery without angina pectoris: Secondary | ICD-10-CM | POA: Diagnosis not present

## 2017-06-19 DIAGNOSIS — F329 Major depressive disorder, single episode, unspecified: Secondary | ICD-10-CM

## 2017-06-19 DIAGNOSIS — E782 Mixed hyperlipidemia: Secondary | ICD-10-CM

## 2017-06-19 DIAGNOSIS — F419 Anxiety disorder, unspecified: Secondary | ICD-10-CM

## 2017-06-19 DIAGNOSIS — I10 Essential (primary) hypertension: Secondary | ICD-10-CM | POA: Diagnosis not present

## 2017-06-19 DIAGNOSIS — F32A Depression, unspecified: Secondary | ICD-10-CM

## 2017-06-19 NOTE — Assessment & Plan Note (Signed)
Under good control. Continue current regimen. Continue to monitor. Call with any concerns. 

## 2017-06-19 NOTE — Progress Notes (Signed)
BP 132/87 (BP Location: Left Arm, Patient Position: Sitting, Cuff Size: Normal)   Pulse 77   Temp (!) 97.5 F (36.4 C)   Wt 152 lb 5 oz (69.1 kg)   SpO2 95%   BMI 24.22 kg/m    Subjective:    Patient ID: Brian [name], male    DOB: August 29, 1933, 83 y.o.   MRN: [id number]  HPI: Brian [name] is a 83 y.o. male  Chief Complaint  Patient presents with  . Hypertension  . Hyperlipidemia   HYPERTENSION / HYPERLIPIDEMIA Satisfied with current treatment? yes Duration of hypertension: chronic BP monitoring frequency: not checking BP medication side effects: yes Past BP meds:  Duration of hyperlipidemia: chronic Cholesterol medication side effects: no Cholesterol supplements: none Past cholesterol medications: atorvastatin Medication compliance: excellent compliance Aspirin: yes Recent stressors: no Recurrent headaches: no Visual changes: no Palpitations: no Dyspnea: no Chest pain: no Lower extremity edema: no Dizzy/lightheaded: no  Relevant past medical, surgical, family and social history reviewed and updated as indicated. Interim medical history since our last visit reviewed. Allergies and medications reviewed and updated.  Review of Systems  Constitutional: Negative.   Respiratory: Negative.   Cardiovascular: Negative.   Musculoskeletal: Negative.   Psychiatric/Behavioral: Negative.     Per HPI unless specifically indicated above     Objective:    BP 132/87 (BP Location: Left Arm, Patient Position: Sitting, Cuff Size: Normal)   Pulse 77   Temp (!) 97.5 F (36.4 C)   Wt 152 lb 5 oz (69.1 kg)   SpO2 95%   BMI 24.22 kg/m   Wt Readings from Last 3 Encounters:  06/19/17 152 lb 5 oz (69.1 kg)  03/06/17 152 lb 9.6 oz (69.2 kg)  12/18/16 154 lb 11.2 oz (70.2 kg)    Physical Exam  Constitutional: He is oriented to person, place, and time. He appears well-developed and well-nourished. No distress.  HENT:  Head: Normocephalic and atraumatic.  Right Ear:  Hearing normal.  Left Ear: Hearing normal.  Nose: Nose normal.  Eyes: Conjunctivae and lids are normal. Right eye exhibits no discharge. Left eye exhibits no discharge. No scleral icterus.  Cardiovascular: Normal rate, regular rhythm, normal heart sounds and intact distal pulses.  Exam reveals no gallop and no friction rub.   No murmur heard. Pulmonary/Chest: Effort normal and breath sounds normal. No respiratory distress. He has no wheezes. He has no rales. He exhibits no tenderness.  Musculoskeletal: Normal range of motion.  Neurological: He is alert and oriented to person, place, and time.  Skin: Skin is warm, dry and intact. No rash noted. He is not diaphoretic. No erythema. No pallor.  Psychiatric: He has a normal mood and affect. His speech is normal and behavior is normal. Judgment and thought content normal. Cognition and memory are normal.  Nursing note and vitals reviewed.   Results for orders placed or performed in visit on 03/06/17  CBC with Differential/Platelet  Result Value Ref Range   WBC 7.4 4.0 - 10.5 K/uL   RBC 4.01 (L) 4.22 - 5.81 Mil/uL   Hemoglobin 12.2 (L) 13.0 - 17.0 g/dL   HCT 36.8 (L) 39.0 - 52.0 %   MCV 91.8 78.0 - 100.0 fl   MCHC 33.2 30.0 - 36.0 g/dL   RDW 14.5 11.5 - 15.5 %   Platelets 183.0 150.0 - 400.0 K/uL   Neutrophils Relative % 55.2 43.0 - 77.0 %   Lymphocytes Relative 31.5 12.0 - 46.0 %   Monocytes Relative 11.1  3.0 - 12.0 %   Eosinophils Relative 1.6 0.0 - 5.0 %   Basophils Relative 0.6 0.0 - 3.0 %   Neutro Abs 4.1 1.4 - 7.7 K/uL   Lymphs Abs 2.3 0.7 - 4.0 K/uL   Monocytes Absolute 0.8 0.1 - 1.0 K/uL   Eosinophils Absolute 0.1 0.0 - 0.7 K/uL   Basophils Absolute 0.0 0.0 - 0.1 K/uL      Assessment & Plan:   Problem List Items Addressed This Visit      Cardiovascular and Mediastinum   HTN (hypertension) - Primary    Under good control. Continue current regimen. Continue to monitor. Call with any concerns.       Relevant Orders   CBC  with Differential/Platelet   Comprehensive metabolic panel     Digestive   GERD (gastroesophageal reflux disease)    Under good control. Continue current regimen. Continue to monitor. Call with any concerns.      Relevant Orders   CBC with Differential/Platelet   Comprehensive metabolic panel     Other   Anxiety and depression    Under good control. Continue current regimen. Continue to monitor. Call with any concerns.      Relevant Orders   CBC with Differential/Platelet   Comprehensive metabolic panel   Hyperlipidemia    Under good control. Continue current regimen. Continue to monitor. Call with any concerns.      Relevant Orders   CBC with Differential/Platelet   Comprehensive metabolic panel   Lipid Panel w/o Chol/HDL Ratio       Follow up plan: Return in about 6 months (around 12/20/2017) for Wellness.

## 2017-06-20 ENCOUNTER — Encounter: Payer: Self-pay | Admitting: Family Medicine

## 2017-06-20 LAB — CBC WITH DIFFERENTIAL/PLATELET
BASOS ABS: 0 10*3/uL (ref 0.0–0.2)
Basos: 1 %
EOS (ABSOLUTE): 0.5 10*3/uL — AB (ref 0.0–0.4)
Eos: 7 %
Hematocrit: 40 % (ref 37.5–51.0)
Hemoglobin: 12.6 g/dL — ABNORMAL LOW (ref 13.0–17.7)
IMMATURE GRANS (ABS): 0 10*3/uL (ref 0.0–0.1)
Immature Granulocytes: 0 %
LYMPHS ABS: 1.8 10*3/uL (ref 0.7–3.1)
LYMPHS: 26 %
MCH: 27.8 pg (ref 26.6–33.0)
MCHC: 31.5 g/dL (ref 31.5–35.7)
MCV: 88 fL (ref 79–97)
Monocytes Absolute: 0.6 10*3/uL (ref 0.1–0.9)
Monocytes: 9 %
NEUTROS ABS: 3.9 10*3/uL (ref 1.4–7.0)
Neutrophils: 57 %
PLATELETS: 171 10*3/uL (ref 150–379)
RBC: 4.53 x10E6/uL (ref 4.14–5.80)
RDW: 17.4 % — ABNORMAL HIGH (ref 12.3–15.4)
WBC: 6.9 10*3/uL (ref 3.4–10.8)

## 2017-06-20 LAB — COMPREHENSIVE METABOLIC PANEL
ALBUMIN: 4.3 g/dL (ref 3.5–4.7)
ALT: 11 IU/L (ref 0–44)
AST: 16 IU/L (ref 0–40)
Albumin/Globulin Ratio: 1.6 (ref 1.2–2.2)
Alkaline Phosphatase: 75 IU/L (ref 39–117)
BILIRUBIN TOTAL: 0.3 mg/dL (ref 0.0–1.2)
BUN / CREAT RATIO: 15 (ref 10–24)
BUN: 13 mg/dL (ref 8–27)
CHLORIDE: 107 mmol/L — AB (ref 96–106)
CO2: 23 mmol/L (ref 20–29)
Calcium: 9.2 mg/dL (ref 8.6–10.2)
Creatinine, Ser: 0.84 mg/dL (ref 0.76–1.27)
GFR calc non Af Amer: 82 mL/min/{1.73_m2} (ref >59)
GFR, EST AFRICAN AMERICAN: 94 mL/min/{1.73_m2} (ref >59)
GLUCOSE: 103 mg/dL — AB (ref 65–99)
Globulin, Total: 2.7 g/dL (ref 1.5–4.5)
Potassium: 4.8 mmol/L (ref 3.5–5.2)
Sodium: 145 mmol/L — ABNORMAL HIGH (ref 134–144)
TOTAL PROTEIN: 7 g/dL (ref 6.0–8.5)

## 2017-06-20 LAB — LIPID PANEL W/O CHOL/HDL RATIO
Cholesterol, Total: 133 mg/dL (ref 100–199)
HDL: 63 mg/dL (ref >39)
LDL CALC: 55 mg/dL (ref 0–99)
Triglycerides: 73 mg/dL (ref 0–149)
VLDL CHOLESTEROL CAL: 15 mg/dL (ref 5–40)

## 2017-07-03 IMAGING — US US RETROPERITONEAL COMPLETE
1 series · 13 of 25 positions shown · non-contrast
Comparison: Abdominal aortic ultrasound September 24, 2015

CLINICAL DATA: Follow-up of known abdominal aortic aneurysm.

EXAM:
ULTRASOUND RETROPERITONEAL COMPLETE
TECHNIQUE: Ultrasound examination of the abdominal aorta was performed to
evaluate for abdominal aortic aneurysm. The common iliac arteries,
IVC, and kidneys were also evaluated.

[Series 1: us retroperitoneal complete · 0.26mm/px · 13 of 51 slices shown]
[im 1/51]
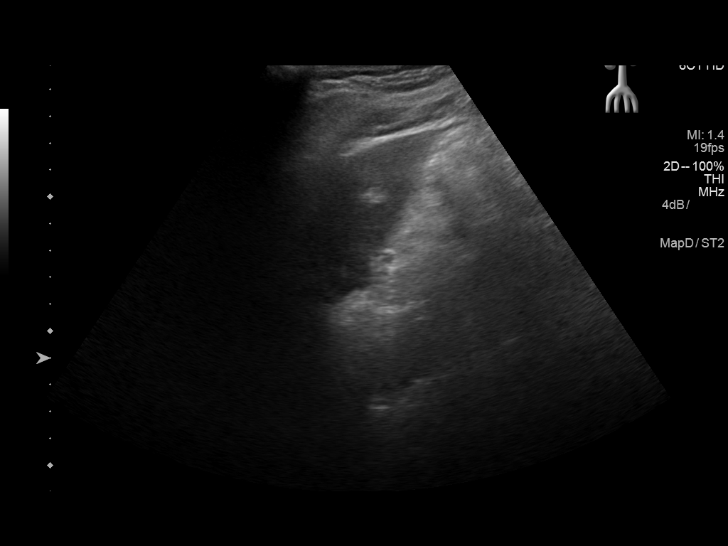
[im 5/51]
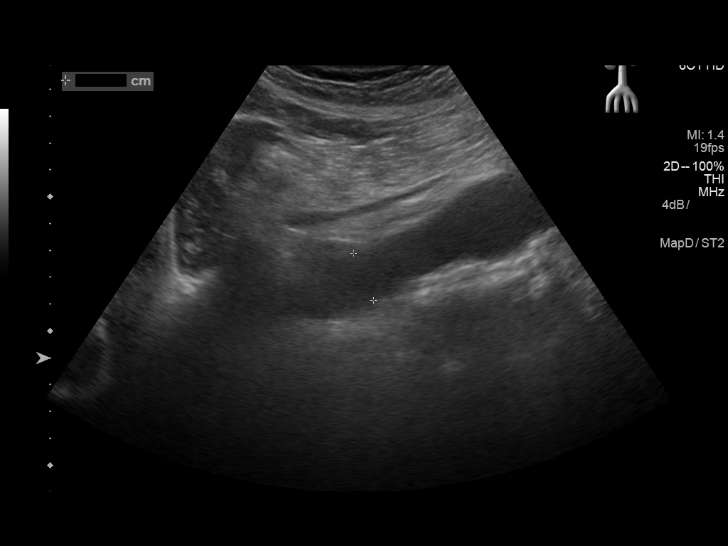
[im 9/51]
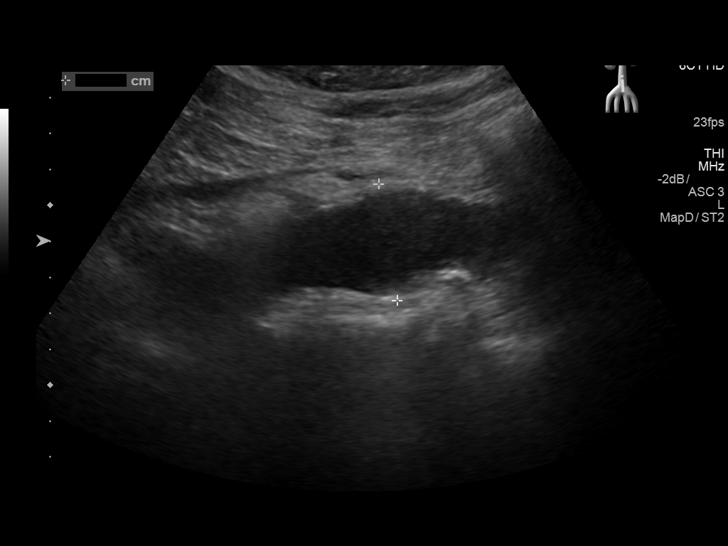
[im 13/51]
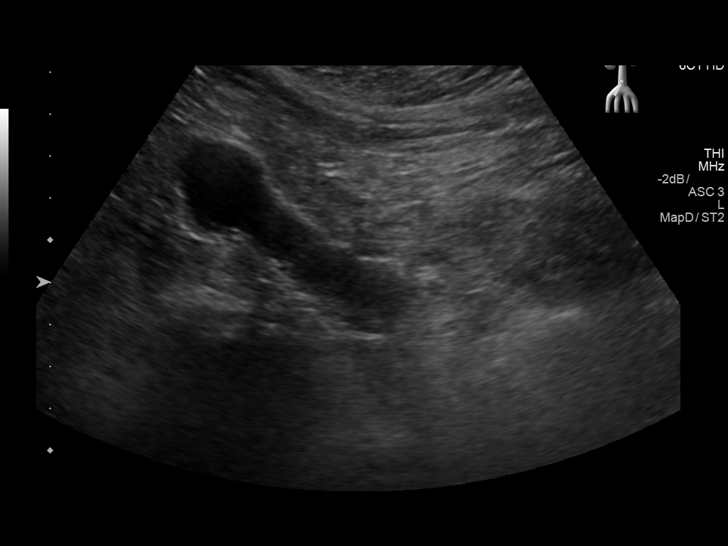
[im 17/51]
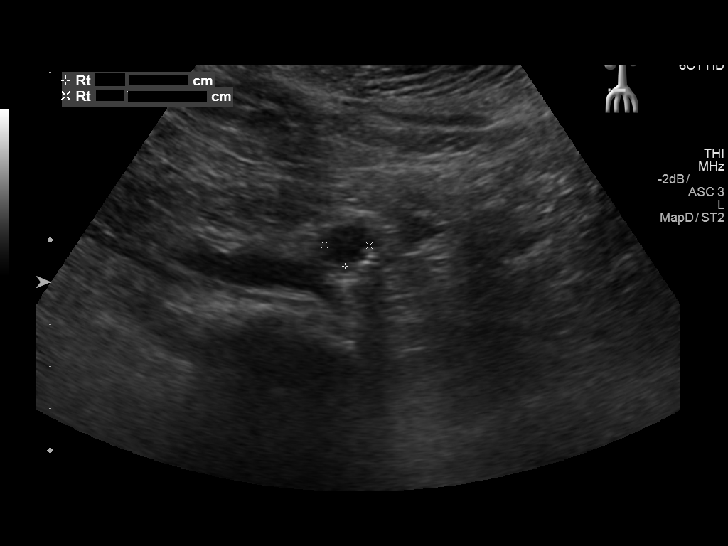
[im 21/51]
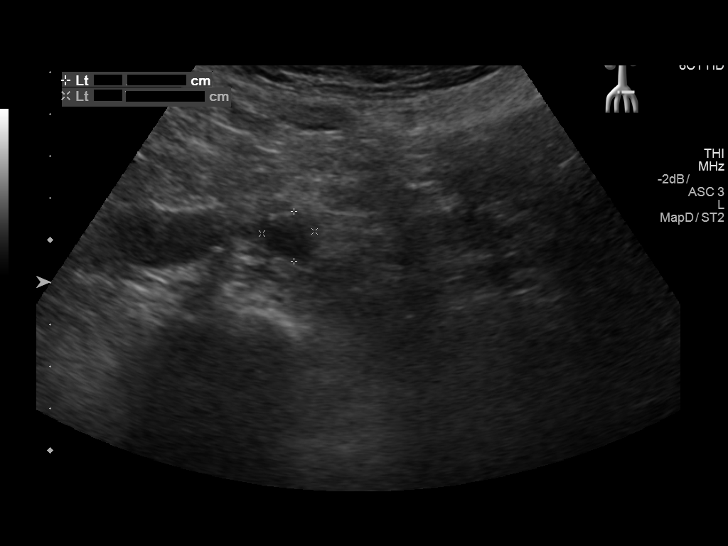
[im 26/51]
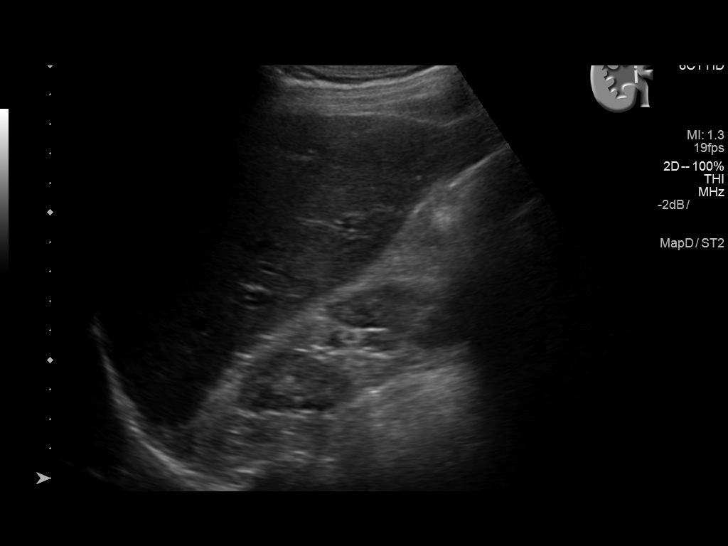
[im 30/51]
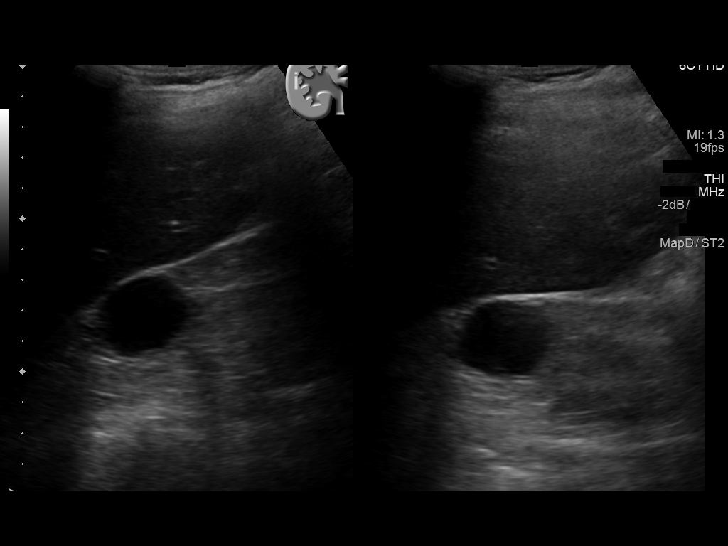
[im 34/51]
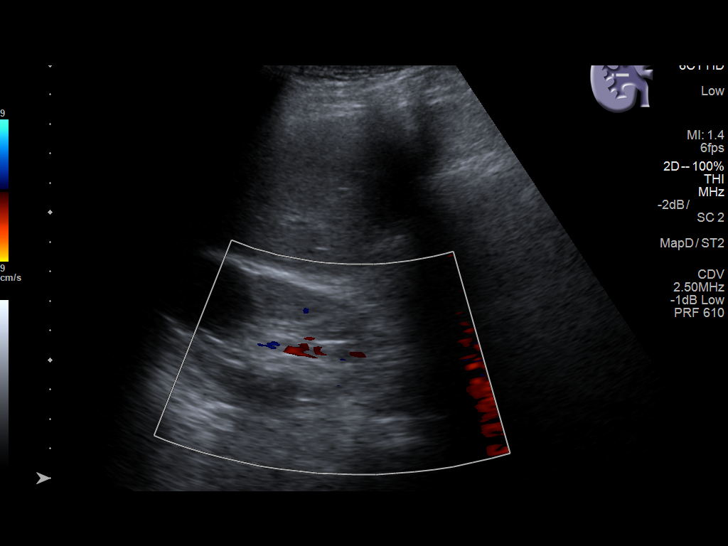
[im 38/51]
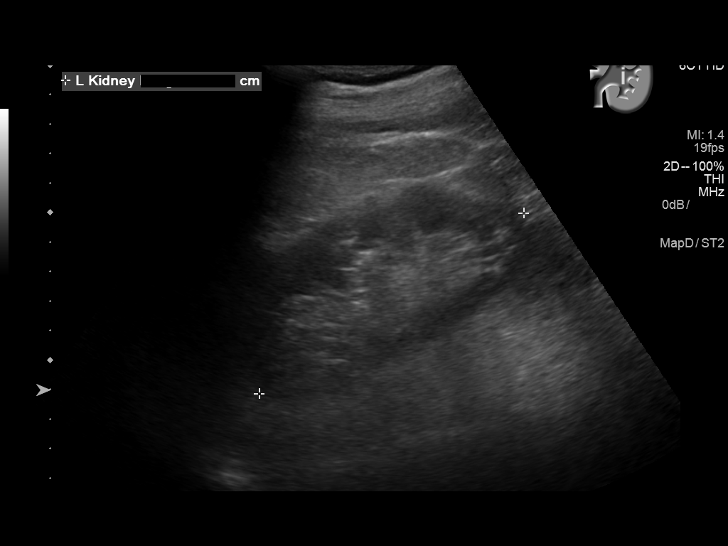
[im 42/51]
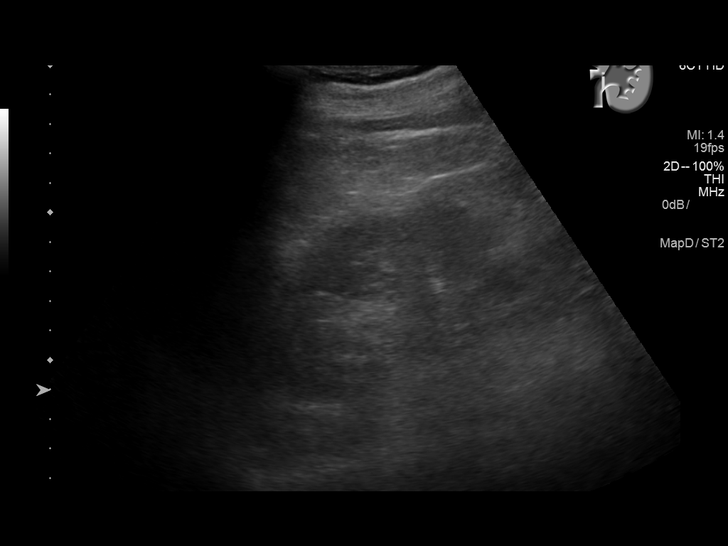
[im 46/51]
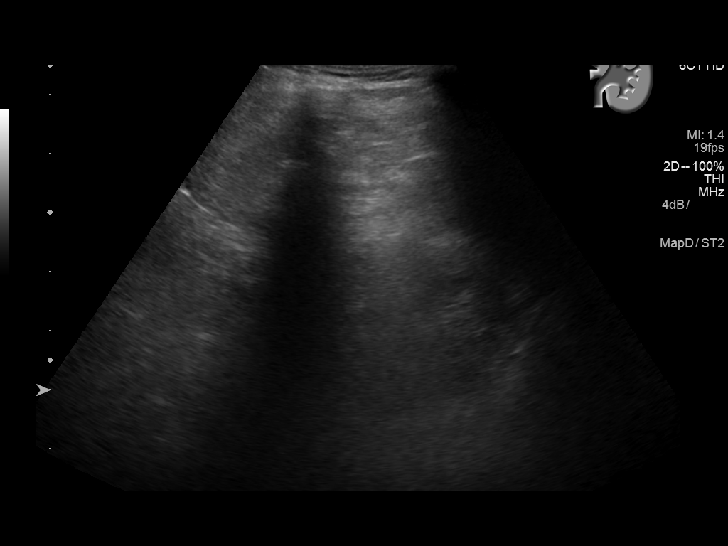
[im 51/51]
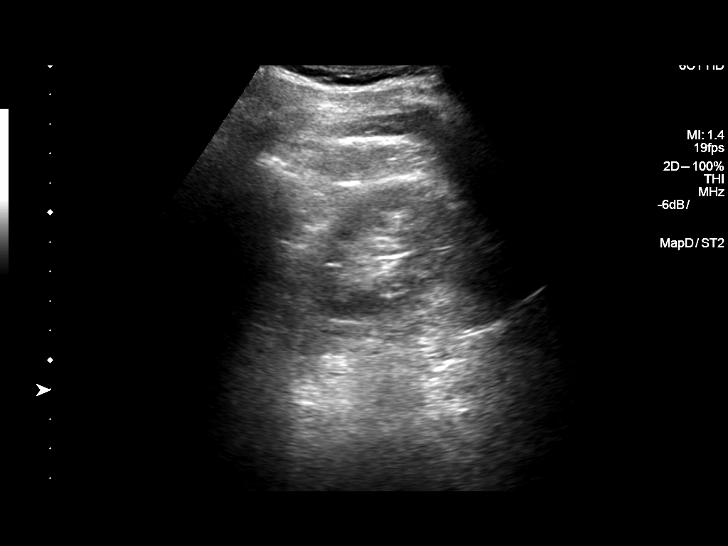

[13 of 25 positions shown; findings below may reference images not displayed]

FINDINGS: Abdominal Aorta

There is an infrarenal abdominal aortic aneurysm which measures
cm AP x 3.7 cm transversely. These values are stable when compared
to the previous study.

Maximum AP

Diameter:  3.6 cm

Maximum TRV

Diameter: 3.7 cm

Right Common Iliac Artery

No aneurysm identified.  1.1 cm in diameter.

Left Common Iliac Artery

No aneurysm identified.  1.2 cm in diameter.

IVC

No abnormality visualized.

Right Kidney

Length: 11 cm the renal cortical echotexture is approximately equal
to that of the adjacent liver. There is a midpole cyst measuring
x 3.2 cm. There is no hydronephrosis.

Left Kidney

Length: 11.4 cm. The renal cortical echotexture similar to that on
the right.
IMPRESSION: 1. Stable distal abdominal aortic aneurysm measuring 3.6 cm AP x
cm transversely. Recommend followup by ultrasound in 2 years. This
recommendation follows ACR consensus guidelines: White Paper of the
ACR Incidental Findings Committee II on Vascular Findings. [HOSPITAL] 5939; [DATE].
2. Stable 3.2 cm midpole cortical cyst in the right kidney. The
renal cortical echotexture both kidneys is mildly increased which
may reflect medical renal disease.

## 2017-07-30 ENCOUNTER — Other Ambulatory Visit: Payer: Self-pay | Admitting: Internal Medicine

## 2017-08-02 ENCOUNTER — Other Ambulatory Visit: Payer: Self-pay | Admitting: Internal Medicine

## 2017-08-07 ENCOUNTER — Telehealth: Payer: Self-pay

## 2017-08-07 MED ORDER — RANITIDINE HCL 150 MG PO TABS: 150.0000 mg | ORAL_TABLET | Freq: Two times a day (BID) | ORAL | 0 refills | Status: DC

## 2017-08-07 NOTE — Telephone Encounter (Signed)
1 month rx sent. 

## 2017-08-13 DIAGNOSIS — Z23 Encounter for immunization: Secondary | ICD-10-CM | POA: Diagnosis not present

## 2017-09-03 ENCOUNTER — Other Ambulatory Visit: Payer: Self-pay | Admitting: Internal Medicine

## 2017-10-16 ENCOUNTER — Encounter: Payer: Self-pay | Admitting: Internal Medicine

## 2017-10-16 ENCOUNTER — Ambulatory Visit (INDEPENDENT_AMBULATORY_CARE_PROVIDER_SITE_OTHER): Payer: Medicare Other | Admitting: Internal Medicine

## 2017-10-16 VITALS — BP 142/68 | HR 68

## 2017-10-16 DIAGNOSIS — F5089 Other specified eating disorder: Secondary | ICD-10-CM | POA: Diagnosis not present

## 2017-10-16 DIAGNOSIS — I2510 Atherosclerotic heart disease of native coronary artery without angina pectoris: Secondary | ICD-10-CM

## 2017-10-16 DIAGNOSIS — R1013 Epigastric pain: Secondary | ICD-10-CM | POA: Diagnosis not present

## 2017-10-16 DIAGNOSIS — K219 Gastro-esophageal reflux disease without esophagitis: Secondary | ICD-10-CM | POA: Diagnosis not present

## 2017-10-16 MED ORDER — RANITIDINE HCL 150 MG PO TABS: 150.0000 mg | ORAL_TABLET | Freq: Two times a day (BID) | ORAL | 5 refills | Status: DC

## 2017-10-16 NOTE — Patient Instructions (Signed)
We have sent the following medications to your pharmacy for you to pick up at your convenience: Zantac  If you are age 81 or older, your body mass index should be between 23-30. Your There is no height or weight on file to calculate BMI. If this is out of the aforementioned range listed, please consider follow up with your Primary Care Provider.  If you are age 60 or younger, your body mass index should be between 19-25. Your There is no height or weight on file to calculate BMI. If this is out of the aformentioned range listed, please consider follow up with your Primary Care Provider.

## 2017-10-16 NOTE — Progress Notes (Signed)
Subjective:    Patient ID: Brian [name], male    DOB: 30-Apr-1933, 83 y.o.   MRN: [id number]  HPI Brian [name] is an 83 year old male with a history of adenomatous colon polyps, history of diverticulosis, history of H. pylori status post treatment, history of acquired duodenal stenosis with history of peptic ulcer disease, history of alcohol abuse in remission, AAA, hypertension, hyperlipidemia and CAD on Plavix who is here for follow-up.  He is here with his wife and was last seen in April 2018.  He reports that he has been doing very well.  He has had no further episodes with rectal bleeding or hematochezia.  He was felt to have a diverticular bleed last December and was observed.  He reports his heartburn and dyspeptic symptoms have been well controlled with ranitidine which he is taking 150 mg twice daily.  He denies dysphagia and odynophagia.  No nausea vomiting or early satiety.  Good appetite.  Bowel movements have been regular and he has not seen blood or melena.  He will occasionally use Zofran for nausea but has needed this rarely lately.  He has found himself to be eating ice a lot lately and he wonders why he craves this.  Review of Systems As per HPI, otherwise negative  Current Medications, Allergies, Past Medical History, Past Surgical History, Family History and Social History were reviewed in [employer] record.     Objective:   Physical Exam BP (!) 142/68   Pulse 68  Constitutional: Well-developed and well-nourished. No distress. HEENT: Normocephalic and atraumatic. Oropharynx is clear and moist. Conjunctivae are normal.  No scleral icterus. Neck: Neck supple. Trachea midline. Cardiovascular: Normal rate, regular rhythm and intact distal pulses.  Pulmonary/chest: Effort normal and breath sounds normal. No wheezing, rales or rhonchi. Abdominal: Soft, nontender, nondistended. Bowel sounds active throughout. Extremities: no clubbing, cyanosis, or  edema Neurological: Alert and oriented to person place and time. Skin: Skin is warm and dry. Psychiatric: Normal mood and affect. Behavior is normal.  CBC    Component Value Date/Time   WBC 6.9 06/19/2017 0944   WBC 7.4 03/06/2017 1552   RBC 4.53 06/19/2017 0944   RBC 4.01 (L) 03/06/2017 1552   HGB 12.6 (L) 06/19/2017 0944   HCT 40.0 06/19/2017 0944   PLT 171 06/19/2017 0944   MCV 88 06/19/2017 0944   MCV 96 04/15/2012 0210   MCH 27.8 06/19/2017 0944   MCH 33.2 10/29/2016 0101   MCHC 31.5 06/19/2017 0944   MCHC 33.2 03/06/2017 1552   RDW 17.4 (H) 06/19/2017 0944   RDW 13.5 04/15/2012 0210   LYMPHSABS 1.8 06/19/2017 0944   MONOABS 0.8 03/06/2017 1552   EOSABS 0.5 (H) 06/19/2017 0944   BASOSABS 0.0 06/19/2017 0944   Iron/TIBC/Ferritin/ %Sat    Component Value Date/Time   IRON 106 06/15/2016 0952   TIBC 290 06/15/2016 0952   FERRITIN 37 06/15/2016 0952   IRONPCTSAT 37 06/15/2016 0952       Assessment & Plan:  83 year old male with a history of adenomatous colon polyps, history of diverticulosis, history of H. pylori status post treatment, history of acquired duodenal stenosis with history of peptic ulcer disease, history of alcohol abuse in remission, AAA, hypertension, hyperlipidemia and CAD on Plavix who is here for follow-up.   1.  GERD/dyspepsia/history of peptic ulcer disease/nausea--doing well on ranitidine and avoiding high fat food.  He is also no longer drinking alcohol which is likely helped tremendously.  We will continue ranitidine  150 mg twice daily and as needed Zofran 4 mg every 6 hours as needed nausea.  2.  Ice craving --we check his iron studies and blood counts.  3. Hx of colon polyps --deferred surveillance colonoscopy based on age.  This is his preference.  6-15-month follow-up, sooner if needed 15 minutes spent with the patient today. Greater than 50% was spent in counseling and coordination of care with the patient

## 2017-10-17 ENCOUNTER — Telehealth: Payer: Self-pay | Admitting: *Deleted

## 2017-10-17 DIAGNOSIS — F5089 Other specified eating disorder: Secondary | ICD-10-CM

## 2017-10-17 NOTE — Telephone Encounter (Signed)
I have attempted to reach patient at the number we have listed and number rings many times with no answer and no voicemail. I will attempt to call back at a later time.

## 2017-10-17 NOTE — Telephone Encounter (Signed)
Patient returning call. States he will be home around 3:30pm if you want to speak to him at his home number

## 2017-10-17 NOTE — Telephone Encounter (Signed)
Patient states that he will come for labs next time he comes to [facility] (it may be right after Christmas). Orders have been placed in Epic.

## 2017-10-17 NOTE — Telephone Encounter (Signed)
-----   Message from Jerene Bears, MD sent at 10/16/2017  4:47 PM EST ----- He has been craving ice Meant to check CBC and iron studies (ferritin +IBC).  Can this be drawn in [place], closer to home??? If not, then here at his convenience

## 2017-10-17 NOTE — Telephone Encounter (Signed)
I left message with male for patient to call back.

## 2017-11-26 ENCOUNTER — Other Ambulatory Visit (INDEPENDENT_AMBULATORY_CARE_PROVIDER_SITE_OTHER): Payer: Medicare HMO

## 2017-11-26 DIAGNOSIS — I10 Essential (primary) hypertension: Secondary | ICD-10-CM | POA: Diagnosis not present

## 2017-11-26 DIAGNOSIS — F5089 Other specified eating disorder: Secondary | ICD-10-CM

## 2017-11-26 DIAGNOSIS — I351 Nonrheumatic aortic (valve) insufficiency: Secondary | ICD-10-CM | POA: Diagnosis not present

## 2017-11-26 DIAGNOSIS — E785 Hyperlipidemia, unspecified: Secondary | ICD-10-CM | POA: Diagnosis not present

## 2017-11-26 DIAGNOSIS — R69 Illness, unspecified: Secondary | ICD-10-CM | POA: Diagnosis not present

## 2017-11-26 DIAGNOSIS — I2510 Atherosclerotic heart disease of native coronary artery without angina pectoris: Secondary | ICD-10-CM | POA: Diagnosis not present

## 2017-11-26 LAB — CBC WITH DIFFERENTIAL/PLATELET
BASOS ABS: 0.1 10*3/uL (ref 0.0–0.1)
BASOS PCT: 0.8 % (ref 0.0–3.0)
EOS ABS: 0.6 10*3/uL (ref 0.0–0.7)
Eosinophils Relative: 7.8 % — ABNORMAL HIGH (ref 0.0–5.0)
HEMATOCRIT: 39.8 % (ref 39.0–52.0)
HEMOGLOBIN: 13.1 g/dL (ref 13.0–17.0)
LYMPHS PCT: 36.5 % (ref 12.0–46.0)
Lymphs Abs: 2.9 10*3/uL (ref 0.7–4.0)
MCHC: 32.8 g/dL (ref 30.0–36.0)
MCV: 93.9 fl (ref 78.0–100.0)
Monocytes Absolute: 0.7 10*3/uL (ref 0.1–1.0)
Monocytes Relative: 9.3 % (ref 3.0–12.0)
Neutro Abs: 3.6 10*3/uL (ref 1.4–7.7)
Neutrophils Relative %: 45.6 % (ref 43.0–77.0)
Platelets: 162 10*3/uL (ref 150.0–400.0)
RBC: 4.24 Mil/uL (ref 4.22–5.81)
RDW: 14.6 % (ref 11.5–15.5)
WBC: 8 10*3/uL (ref 4.0–10.5)

## 2017-11-26 LAB — IBC PANEL
IRON: 76 ug/dL (ref 42–165)
Saturation Ratios: 18 % — ABNORMAL LOW (ref 20.0–50.0)
TRANSFERRIN: 302 mg/dL (ref 212.0–360.0)

## 2017-11-26 LAB — FERRITIN: Ferritin: 10 ng/mL — ABNORMAL LOW (ref 22.0–322.0)

## 2017-11-27 ENCOUNTER — Other Ambulatory Visit: Payer: Self-pay

## 2017-11-27 DIAGNOSIS — D509 Iron deficiency anemia, unspecified: Secondary | ICD-10-CM

## 2017-12-13 DIAGNOSIS — I10 Essential (primary) hypertension: Secondary | ICD-10-CM | POA: Diagnosis not present

## 2017-12-13 DIAGNOSIS — R69 Illness, unspecified: Secondary | ICD-10-CM | POA: Diagnosis not present

## 2017-12-13 DIAGNOSIS — E1169 Type 2 diabetes mellitus with other specified complication: Secondary | ICD-10-CM | POA: Diagnosis not present

## 2017-12-25 ENCOUNTER — Ambulatory Visit (INDEPENDENT_AMBULATORY_CARE_PROVIDER_SITE_OTHER): Payer: Medicare HMO | Admitting: Family Medicine

## 2017-12-25 ENCOUNTER — Encounter: Payer: Self-pay | Admitting: Family Medicine

## 2017-12-25 VITALS — BP 131/83 | HR 66 | Temp 97.5°F | Ht 65.7 in | Wt 152.4 lb

## 2017-12-25 DIAGNOSIS — Z0000 Encounter for general adult medical examination without abnormal findings: Secondary | ICD-10-CM

## 2017-12-25 DIAGNOSIS — I2510 Atherosclerotic heart disease of native coronary artery without angina pectoris: Secondary | ICD-10-CM

## 2017-12-25 DIAGNOSIS — I5022 Chronic systolic (congestive) heart failure: Secondary | ICD-10-CM | POA: Diagnosis not present

## 2017-12-25 DIAGNOSIS — N200 Calculus of kidney: Secondary | ICD-10-CM

## 2017-12-25 DIAGNOSIS — F32A Depression, unspecified: Secondary | ICD-10-CM

## 2017-12-25 DIAGNOSIS — K219 Gastro-esophageal reflux disease without esophagitis: Secondary | ICD-10-CM | POA: Diagnosis not present

## 2017-12-25 DIAGNOSIS — I714 Abdominal aortic aneurysm, without rupture, unspecified: Secondary | ICD-10-CM

## 2017-12-25 DIAGNOSIS — R69 Illness, unspecified: Secondary | ICD-10-CM | POA: Diagnosis not present

## 2017-12-25 DIAGNOSIS — I129 Hypertensive chronic kidney disease with stage 1 through stage 4 chronic kidney disease, or unspecified chronic kidney disease: Secondary | ICD-10-CM | POA: Diagnosis not present

## 2017-12-25 DIAGNOSIS — Z0001 Encounter for general adult medical examination with abnormal findings: Secondary | ICD-10-CM

## 2017-12-25 DIAGNOSIS — E782 Mixed hyperlipidemia: Secondary | ICD-10-CM

## 2017-12-25 DIAGNOSIS — F419 Anxiety disorder, unspecified: Secondary | ICD-10-CM

## 2017-12-25 DIAGNOSIS — Z720 Tobacco use: Secondary | ICD-10-CM | POA: Insufficient documentation

## 2017-12-25 DIAGNOSIS — Z8719 Personal history of other diseases of the digestive system: Secondary | ICD-10-CM | POA: Diagnosis not present

## 2017-12-25 DIAGNOSIS — Z23 Encounter for immunization: Secondary | ICD-10-CM | POA: Diagnosis not present

## 2017-12-25 DIAGNOSIS — F329 Major depressive disorder, single episode, unspecified: Secondary | ICD-10-CM

## 2017-12-25 DIAGNOSIS — I10 Essential (primary) hypertension: Secondary | ICD-10-CM | POA: Diagnosis not present

## 2017-12-25 DIAGNOSIS — E440 Moderate protein-calorie malnutrition: Secondary | ICD-10-CM | POA: Diagnosis not present

## 2017-12-25 DIAGNOSIS — M1990 Unspecified osteoarthritis, unspecified site: Secondary | ICD-10-CM

## 2017-12-25 LAB — MICROALBUMIN, URINE WAIVED
CREATININE, URINE WAIVED: 50 mg/dL (ref 10–300)
MICROALB, UR WAIVED: 10 mg/L (ref 0–19)

## 2017-12-25 LAB — UA/M W/RFLX CULTURE, ROUTINE
BILIRUBIN UA: NEGATIVE
GLUCOSE, UA: NEGATIVE
Ketones, UA: NEGATIVE
LEUKOCYTES UA: NEGATIVE
Nitrite, UA: NEGATIVE
PROTEIN UA: NEGATIVE
RBC, UA: NEGATIVE
Specific Gravity, UA: 1.01 (ref 1.005–1.030)
Urobilinogen, Ur: 0.2 mg/dL (ref 0.2–1.0)
pH, UA: 7 (ref 5.0–7.5)

## 2017-12-25 MED ORDER — ATORVASTATIN CALCIUM 80 MG PO TABS: 80.0000 mg | ORAL_TABLET | Freq: Every day | ORAL | 3 refills | Status: DC

## 2017-12-25 NOTE — Assessment & Plan Note (Signed)
UA negative. Continue hydration. Call with any concerns.

## 2017-12-25 NOTE — Assessment & Plan Note (Signed)
Due for repeat US- ordered today. Await results. Call with any concerns. Keep BP and cholesterol under good control.

## 2017-12-25 NOTE — Progress Notes (Signed)
BP 131/83 (BP Location: Left Arm, Patient Position: Sitting, Cuff Size: Normal)   Pulse 66   Temp (!) 97.5 F (36.4 C)   Ht 5' 5.7" (1.669 m)   Wt 152 lb 6 oz (69.1 kg)   SpO2 97%   BMI 24.82 kg/m    Subjective:    Patient ID: Brian [name], male    DOB: Oct 03, 1933, 84 y.o.   MRN: [id number]  HPI: Brian [name] is a 84 y.o. male presenting on 12/25/2017 for comprehensive medical examination. Current medical complaints include:  HYPERTENSION / HYPERLIPIDEMIA Satisfied with current treatment? yes Duration of hypertension: chronic BP monitoring frequency: not checking BP medication side effects: no Past BP meds: lisinopril, metoprolol Duration of hyperlipidemia: chronic Cholesterol medication side effects: no Cholesterol supplements: none Past cholesterol medications: atorvastain (lipitor) Medication compliance: excellent compliance Aspirin: yes Recent stressors: no Recurrent headaches: no Visual changes: no Palpitations: no Dyspnea: no Chest pain: no Lower extremity edema: no Dizzy/lightheaded: no  CORONARY ARTERY DISEASE Angina frequency: Never Chest pain: no Edema: no Orthopnea: no Paroxysmal nocturnal dyspnea: no Dyspnea on exertion: no Pneumovax:  Up to Date Aspirin: no  GERD GERD control status: controlled  Satisfied with current treatment? yes Heartburn frequency: with food choices Medication side effects: no  Medication compliance: excellent Dysphagia: no Odynophagia:  no Hematemesis: no Blood in stool: no EGD: no  ANXIETY/STRESS- managed by his pain doctor. On valium. Not interested in trying preventative medicine.  Duration:stable Anxious mood: yes  Excessive worrying: no Irritability: no  Sweating: no Nausea: no Palpitations:yes Hyperventilation: no Panic attacks: no Agoraphobia: no  Obscessions/compulsions: no Depressed mood: yes Depression screen [facility] 2/9 12/25/2017 06/19/2017 12/18/2016 01/14/2016 01/14/2016  Decreased Interest 0 2 0 1 1    Down, Depressed, Hopeless 0 1 0 1 0  PHQ - 2 Score 0 3 0 2 1  Altered sleeping 0 1 - 0 -  Tired, decreased energy 0 3 - 2 -  Change in appetite 0 1 - 1 -  Feeling bad or failure about yourself  0 1 - 0 -  Trouble concentrating 0 0 - 0 -  Moving slowly or fidgety/restless 0 0 - 1 -  Suicidal thoughts 0 0 - - -  PHQ-9 Score 0 9 - 6 -  Difficult doing work/chores - Not difficult at all - Not difficult at all -   Anhedonia: no Weight changes: no Insomnia: no   Hypersomnia: no Fatigue/loss of energy: no Feelings of worthlessness: no Feelings of guilt: no Impaired concentration/indecisiveness: no Suicidal ideations: no  Crying spells: no Recent Stressors/Life Changes: no   Relationship problems: no   Family stress: no     Financial stress: no    Job stress: no    Recent death/loss: no  He currently lives with: his wife Interim Problems from his last visit: no  Functional Status Survey: Is the patient deaf or have difficulty hearing?: Yes Does the patient have difficulty seeing, even when wearing glasses/contacts?: No Does the patient have difficulty concentrating, remembering, or making decisions?: No Does the patient have difficulty walking or climbing stairs?: No Does the patient have difficulty dressing or bathing?: No Does the patient have difficulty doing errands alone such as visiting a doctor's office or shopping?: No  FALL RISK: Fall Risk  12/25/2017 06/19/2017 12/18/2016 01/14/2016  Falls in the past year? No Yes No Yes  Number falls in past yr: - 2 or more - 1  Injury with Fall? - No - No  Risk for fall due to : - Medication side effect;Impaired balance/gait;Impaired mobility - -  Follow up - Education provided;Falls prevention discussed - -    Depression Screen Depression screen [facility] 2/9 12/25/2017 06/19/2017 12/18/2016 01/14/2016 01/14/2016  Decreased Interest 0 2 0 1 1  Down, Depressed, Hopeless 0 1 0 1 0  PHQ - 2 Score 0 3 0 2 1  Altered sleeping 0 1 - 0 -  Tired,  decreased energy 0 3 - 2 -  Change in appetite 0 1 - 1 -  Feeling bad or failure about yourself  0 1 - 0 -  Trouble concentrating 0 0 - 0 -  Moving slowly or fidgety/restless 0 0 - 1 -  Suicidal thoughts 0 0 - - -  PHQ-9 Score 0 9 - 6 -  Difficult doing work/chores - Not difficult at all - Not difficult at all -   Advanced Directives Will bring in copy- does not want to make changes  Past Medical History:  Past Medical History:  Diagnosis Date  . AAA (abdominal aortic aneurysm) (HCC)    2.9 CM infrarenal AAA on CT 11/2011  . Acute anterior wall MI ([place]) 05/29/2014  . Alcoholism ([place])   . Aneurysm of abdominal aorta (HCC)    SMALL  . Anxiety    panic attacks, rare.  . Aortic aneurysm (HCC)    small  . Arthritis   . Coronary artery disease    2 stents in july 2015  . Depression with anxiety   . Diverticulosis of colon 10/2009  . 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   . Gastritis 03/2012  . GERD (gastroesophageal reflux disease)   . GI bleed   . HFrEF (heart failure with reduced ejection fraction) ([place])   . HOH (hard of hearing)   . Hyperlipemia   . Hypertension   . Hypovolemic shock ([place])   . Kidney stones   . Myocardial infarction ([place]) 2015  . Panic attacks   . Positive H. pylori titer ?, before 2010   treated with Abx.   . Tubular adenoma of colon     Surgical History:  Past Surgical History:  Procedure Laterality Date  . CARDIAC CATHETERIZATION  2015   Stents  . CATARACT EXTRACTION W/PHACO Right 09/21/2016   Procedure: CATARACT EXTRACTION PHACO AND INTRAOCULAR LENS PLACEMENT (IOC);  Surgeon: Eulogio Bear, MD;  Location: [facility] ORS;  Service: Ophthalmology;  Laterality: Right;  [place] 1.10 AP% 13.8 CDE 9.93 Fluid pack lot # [id number]  . CATARACT EXTRACTION W/PHACO Left 12/07/2016   Procedure: CATARACT EXTRACTION PHACO AND INTRAOCULAR LENS PLACEMENT (IOC);  Surgeon: Eulogio Bear, MD;  Location: [facility] ORS;  Service: Ophthalmology;  Laterality: Left;  [place]  00:42.7 ap 13.7 cde 5.85 lot #[id number]  . COLONOSCOPY WITH PROPOFOL Left 06/06/2015   Procedure: COLONOSCOPY WITH PROPOFOL;  Surgeon: Hulen Luster, MD;  Location[facility] ENDOSCOPY;  Service: Endoscopy;  Laterality: Left;  . CORONARY ANGIOPLASTY     2015  . LEFT HEART CATHETERIZATION WITH CORONARY ANGIOGRAM Bilateral 05/29/2014   Procedure: LEFT HEART CATHETERIZATION WITH CORONARY ANGIOGRAM;  Surgeon: Laverda Page, MD;  Location: [facility] CATH LAB;  Service: Cardiovascular;  Laterality: Bilateral;  . PERCUTANEOUS CORONARY STENT INTERVENTION (PCI-S)  05/29/2014   Procedure: PERCUTANEOUS CORONARY STENT INTERVENTION (PCI-S);  Surgeon: Laverda Page, MD;  Location: [facility] C[facility] LAB;  Service: Cardiovascular;;  DES x2 Prox and Mid LAD     Medications:  Current Outpatient Medications on File Prior to  Visit  Medication Sig  . aspirin 81 MG chewable tablet Chew 81 mg by mouth daily.  . clopidogrel (PLAVIX) 75 MG tablet Take 75 mg by mouth daily.   . diazepam (VALIUM) 5 MG tablet Take 5 mg by mouth every 8 (eight) hours as needed (for anxiety).   Marland Kitchen lisinopril (PRINIVIL,ZESTRIL) 5 MG tablet Take 5 mg by mouth daily.   . metoprolol succinate (TOPROL-XL) 25 MG 24 hr tablet Take 25 mg by mouth daily.   . nitroGLYCERIN (NITROSTAT) 0.4 MG SL tablet Place 0.4 mg under the tongue every 5 (five) minutes x 3 doses as needed for chest pain.  Marland Kitchen ondansetron (ZOFRAN-ODT) 4 MG disintegrating tablet DISSOLVE 1 TABLET IN MOUTH EVERY 6 HOURS AS NEEDED FOR NAUSEA AND VOMITING (NAUSEA/UPPER  ABDOMINAL  PAIN)  . ranitidine (ZANTAC) 150 MG tablet Take 1 tablet (150 mg total) by mouth 2 (two) times daily.  . traMADol (ULTRAM) 50 MG tablet Take 1 tablet (50 mg total) by mouth every 4 (four) hours as needed. (Patient taking differently: Take 50 mg by mouth every 4 (four) hours as needed (for pain). )   No current facility-administered medications on file prior to visit.     Allergies:  Allergies  Allergen Reactions  . Zoloft  [Sertraline Hcl] Nausea Only    Patient states that he was not himself when he took this drug    Social History:  Social History   Socioeconomic History  . Marital status: Married    Spouse name: Not on file  . Number of children: Not on file  . Years of education: Not on file  . Highest education level: Not on file  Social Needs  . Financial resource strain: Not on file  . Food insecurity - worry: Not on file  . Food insecurity - inability: Not on file  . Transportation needs - medical: Not on file  . Transportation needs - non-medical: Not on file  Occupational History  . Not on file  Tobacco Use  . Smoking status: Current Every Day Smoker    Packs/day: 0.25    Years: 60.00    Pack years: 15.00    Types: Cigarettes  . Smokeless tobacco: Never Used  Substance and Sexual Activity  . Alcohol use: No    Alcohol/week: 0.0 oz  . Drug use: No  . Sexual activity: No  Other Topics Concern  . Not on file  Social History Narrative  . Not on file   Social History   Tobacco Use  Smoking Status Current Every Day Smoker  . Packs/day: 0.25  . Years: 60.00  . Pack years: 15.00  . Types: Cigarettes  Smokeless Tobacco Never Used   Social History   Substance and Sexual Activity  Alcohol Use No  . Alcohol/week: 0.0 oz    Family History:  Family History  Problem Relation Age of Onset  . Ulcers Father        stomach   . Heart disease Father   . Heart disease Mother   . Anuerysm Brother   . Hypertension Daughter   . Heart disease Paternal Grandmother   . Heart attack Paternal Grandmother   . Heart disease Paternal Grandfather   . Colon cancer Neg Hx     Past medical history, surgical history, medications, allergies, family history and social history reviewed with patient today and changes made to appropriate areas of the chart.   Review of Systems  Constitutional: Negative.   HENT: Positive for hearing loss. Negative  for congestion, ear discharge, ear pain,  nosebleeds, sinus pain, sore throat and tinnitus.   Eyes: Negative.   Respiratory: Negative.  Negative for stridor.   Cardiovascular: Negative.   Gastrointestinal: Positive for constipation, diarrhea, heartburn and nausea. Negative for abdominal pain, blood in stool, melena and vomiting.  Genitourinary: Negative.        Some hesitancy, no dribbling  Musculoskeletal: Positive for joint pain and myalgias. Negative for back pain, falls and neck pain.  Skin: Negative.   Neurological: Positive for tingling (in his fingers, goes away with shaking). Negative for dizziness, tremors, sensory change, speech change, focal weakness, seizures, loss of consciousness and headaches.  Endo/Heme/Allergies: Negative for environmental allergies and polydipsia. Bruises/bleeds easily.       +Pica  Psychiatric/Behavioral: Negative.     All other ROS negative except what is listed above and in the HPI.      Objective:    BP 131/83 (BP Location: Left Arm, Patient Position: Sitting, Cuff Size: Normal)   Pulse 66   Temp (!) 97.5 F (36.4 C)   Ht 5' 5.7" (1.669 m)   Wt 152 lb 6 oz (69.1 kg)   SpO2 97%   BMI 24.82 kg/m   Wt Readings from Last 3 Encounters:  12/25/17 152 lb 6 oz (69.1 kg)  06/19/17 152 lb 5 oz (69.1 kg)  03/06/17 152 lb 9.6 oz (69.2 kg)    Physical Exam  Constitutional: He is oriented to person, place, and time. He appears well-developed and well-nourished. No distress.  HENT:  Head: Normocephalic and atraumatic.  Right Ear: Hearing, tympanic membrane, external ear and ear canal normal.  Left Ear: Hearing, tympanic membrane, external ear and ear canal normal.  Nose: Nose normal.  Mouth/Throat: Uvula is midline, oropharynx is clear and moist and mucous membranes are normal. No oropharyngeal exudate.  Eyes: Conjunctivae, EOM and lids are normal. Pupils are equal, round, and reactive to light. Right eye exhibits no discharge. Left eye exhibits no discharge. No scleral icterus.  Neck:  Normal range of motion. Neck supple. No JVD present. No tracheal deviation present. No thyromegaly present.  Cardiovascular: Normal rate, regular rhythm, normal heart sounds and intact distal pulses. Exam reveals no gallop and no friction rub.  No murmur heard. Pulmonary/Chest: Effort normal and breath sounds normal. No stridor. No respiratory distress. He has no wheezes. He has no rales. He exhibits no tenderness.  Abdominal: Soft. Bowel sounds are normal. He exhibits no distension and no mass. There is no tenderness. There is no rebound and no guarding.  Genitourinary:  Genitourinary Comments: Genital exam deferred with shared decision making  Musculoskeletal: Normal range of motion. He exhibits no edema, tenderness or deformity.  Lymphadenopathy:    He has no cervical adenopathy.  Neurological: He is alert and oriented to person, place, and time. He has normal reflexes. He displays normal reflexes. No cranial nerve deficit. He exhibits normal muscle tone. Coordination normal.  Skin: Skin is warm, dry and intact. No rash noted. He is not diaphoretic. No erythema. No pallor.  Psychiatric: He has a normal mood and affect. His speech is normal and behavior is normal. Judgment and thought content normal. Cognition and memory are normal.  Nursing note and vitals reviewed.   6CIT Screen 12/25/2017 12/18/2016  What Year? 0 points 0 points  What month? 0 points 0 points  What time? 0 points 0 points  Count back from 20 0 points 0 points  Months in reverse 0 points 0 points  Repeat  phrase 0 points 0 points  Total Score 0 0    Results for orders placed or performed in visit on 12/25/17  Microalbumin, Urine Waived  Result Value Ref Range   Microalb, Ur Waived 10 0 - 19 mg/L   Creatinine, Urine Waived 50 10 - 300 mg/dL   Microalb/Creat Ratio 30-300 (H) <30 mg/g  UA/M w/rflx Culture, Routine  Result Value Ref Range   Specific Gravity, UA 1.010 1.005 - 1.030   pH, UA 7.0 5.0 - 7.5   Color, UA  Yellow Yellow   Appearance Ur Clear Clear   Leukocytes, UA Negative Negative   Protein, UA Negative Negative/Trace   Glucose, UA Negative Negative   Ketones, UA Negative Negative   RBC, UA Negative Negative   Bilirubin, UA Negative Negative   Urobilinogen, Ur 0.2 0.2 - 1.0 mg/dL   Nitrite, UA Negative Negative      Assessment & Plan:   Problem List Items Addressed This Visit      Cardiovascular and Mediastinum   AAA (abdominal aortic aneurysm) ([place])    Due for repeat US- ordered today. Await results. Call with any concerns. Keep BP and cholesterol under good control.       Relevant Medications   atorvastatin (LIPITOR) 80 MG tablet   Other Relevant Orders   Comprehensive metabolic panel   [place] Retroperitoneal Comp   CAD (coronary artery disease)    Stable. Continue to follow with cardiology. Call with any concerns.      Relevant Medications   atorvastatin (LIPITOR) 80 MG tablet   Other Relevant Orders   Comprehensive metabolic panel   HFrEF (heart failure with reduced ejection fraction) (HCC)    Stable. Continue to follow with cardiology. Call with any concerns.      Relevant Medications   atorvastatin (LIPITOR) 80 MG tablet     Digestive   GERD (gastroesophageal reflux disease)    Stable except when he eats something he shouldn't. Work on diet. Continue to monitor. Call with any concerns.       Relevant Orders   CBC with Differential/Platelet   Comprehensive metabolic panel     Musculoskeletal and Integument   Arthritis    Follows with pain doctor. Continue to monitor. Continue tramadol PRN. Call with any concerns.         Genitourinary   Benign hypertensive renal disease    Under good control. Continue current regimen. Continue to monitor. Call with any concerns.       Renal stones    UA negative. Continue hydration. Call with any concerns.       Relevant Orders   UA/M w/rflx Culture, Routine (Completed)     Other   Anxiety and depression    Stable  on current regimen. Valium from outside provider. Not interested in changing regimen. Continue to monitor.       Relevant Orders   Comprehensive metabolic panel   TSH   Hyperlipidemia    Rechecking levels today. Stable. Continue current regimen. Call with any concerns. Refills given today.      Relevant Medications   atorvastatin (LIPITOR) 80 MG tablet   Other Relevant Orders   Comprehensive metabolic panel   Lipid Panel w/o Chol/HDL Ratio   History of GI diverticular bleed    Checking CBC today- continue to follow with GI. Call with any concerns.       Relevant Orders   CBC with Differential/Platelet   Comprehensive metabolic panel   Malnutrition of moderate degree (HCC)  Rechecking labs today. Call with any concerns.       Relevant Orders   Comprehensive metabolic panel   TSH   Tobacco abuse    Offered help with quitting, not interested at this time. Will consider changing to vaping.        Other Visit Diagnoses    Medicare annual wellness visit, subsequent    -  Primary   Preventative care discussed today as below. Call with any concerns.    Routine general medical examination at a health care facility       Vaccines updated. Screening labs checked today. Colonoscopy N/A. Lung cancer screening N/A. Continue diet and exercise. Call with any concerns.    Immunization due       Pneumovax given today.   Relevant Orders   Pneumococcal polysaccharide vaccine 23-valent greater than or equal to 2yo subcutaneous/IM (Completed)      Preventative Services:  Health Risk Assessment and Personalized Prevention Plan: Done today Bone Mass Measurements: N/A CVD Screening: Done today Colon Cancer Screening: N/a Depression Screening: Done today Diabetes Screening: Done today Glaucoma Screening: See your eye doctor Hepatitis B vaccine: N/A Hepatitis C screening: N/A HIV Screening: Declined Flu Vaccine: up to date Lung cancer Screening: N/A Obesity Screening: Done  today Pneumonia Vaccines (2): Prevnar given last year, pneumovax today STI Screening: N/A PSA screening: N/A  Discussed aspirin prophylaxis for myocardial infarction prevention and decision was made to continue ASA  LABORATORY TESTING:  Health maintenance labs ordered today as discussed above.   The natural history of prostate cancer and ongoing controversy regarding screening and potential treatment outcomes of prostate cancer has been discussed with the patient. The meaning of a false positive PSA and a false negative PSA has been discussed. He indicates understanding of the limitations of this screening test and wishes not to proceed with screening PSA testing.   IMMUNIZATIONS:   - Tdap: Tetanus vaccination status reviewed: last tetanus booster within 10 years. - Influenza:  Up to date - Pneumovax: Administered today - Prevnar: Up to date - Zostavax vaccine: Not applicable  SCREENING: - Colonoscopy: Not applicable  Discussed with patient purpose of the colonoscopy is to detect colon cancer at curable precancerous or early stages   PATIENT COUNSELING:    Sexuality: Discussed sexually transmitted diseases, partner selection, use of condoms, avoidance of unintended pregnancy  and contraceptive alternatives.   Advised to avoid cigarette smoking.  I discussed with the patient that most people either abstain from alcohol or drink within safe limits (<=14/week and <=4 drinks/occasion for males, <=7/weeks and <= 3 drinks/occasion for females) and that the risk for alcohol disorders and other health effects rises proportionally with the number of drinks per week and how often a drinker exceeds daily limits.  Discussed cessation/primary prevention of drug use and availability of treatment for abuse.   Diet: Encouraged to adjust caloric intake to maintain  or achieve ideal body weight, to reduce intake of dietary saturated fat and total fat, to limit sodium intake by avoiding high sodium  foods and not adding table salt, and to maintain adequate dietary potassium and calcium preferably from fresh fruits, vegetables, and low-fat dairy products.    stressed the importance of regular exercise  Injury prevention: Discussed safety belts, safety helmets, smoke detector, smoking near bedding or upholstery.   Dental health: Discussed importance of regular tooth brushing, flossing, and dental visits.   Follow up plan: NEXT PREVENTATIVE PHYSICAL DUE IN 1 YEAR. Return in about 6 months (around  06/24/2018) for FOllow up.

## 2017-12-25 NOTE — Assessment & Plan Note (Signed)
Rechecking levels today. Stable. Continue current regimen. Call with any concerns. Refills given today.

## 2017-12-25 NOTE — Assessment & Plan Note (Signed)
Stable on current regimen. Valium from outside provider. Not interested in changing regimen. Continue to monitor.

## 2017-12-25 NOTE — Patient Instructions (Addendum)
If you haven't heard from the ultrasound for your AAA by end of next week, give [place] a call.   Start tonic water before bed to help with cramps.  Preventative Services:  Health Risk Assessment and Personalized Prevention Plan: Done today Bone Mass Measurements: N/A CVD Screening: Done today Colon Cancer Screening: N/a Depression Screening: Done today Diabetes Screening: Done today Glaucoma Screening: See your eye doctor Hepatitis B vaccine: N/A Hepatitis C screening: N/A HIV Screening: Declined Flu Vaccine: up to date Lung cancer Screening: N/A Obesity Screening: Done today Pneumonia Vaccines (2): Prevnar given last year, pneumovax today STI Screening: N/A PSA screening: N/A Pneumococcal Polysaccharide Vaccine: What You Need to Know 1. Why get vaccinated? Vaccination can protect older adults (and some children and younger adults) from pneumococcal disease. Pneumococcal disease is caused by bacteria that can spread from person to person through close contact. It can cause ear infections, and it can also lead to more serious infections of the:  Lungs (pneumonia),  Blood (bacteremia), and  Covering of the brain and spinal cord (meningitis). Meningitis can cause deafness and brain damage, and it can be fatal.  Anyone can get pneumococcal disease, but children under 29 years of age, people with certain medical conditions, adults over 69 years of age, and cigarette smokers are at the highest risk. About 18,000 older adults die each year from pneumococcal disease in the [place]. Treatment of pneumococcal infections with penicillin and other drugs used to be more effective. But some strains of the disease have become resistant to these drugs. This makes prevention of the disease, through vaccination, even more important. 2. Pneumococcal polysaccharide vaccine (PPSV23) Pneumococcal polysaccharide vaccine (PPSV23) protects against 23 types of pneumococcal bacteria. It will not prevent  all pneumococcal disease. PPSV23 is recommended for:  All adults 42 years of age and older,  Anyone 2 through 82 years of age with certain long-term health problems,  Anyone 2 through 82 years of age with a weakened immune system,  Adults 34 through 82 years of age who smoke cigarettes or have asthma.  Most people need only one dose of PPSV. A second dose is recommended for certain high-risk groups. People 34 and older should get a dose even if they have gotten one or more doses of the vaccine before they turned 65. Your healthcare provider can give you more information about these recommendations. Most healthy adults develop protection within 2 to 3 weeks of getting the shot. 3. Some people should not get this vaccine  Anyone who has had a life-threatening allergic reaction to PPSV should not get another dose.  Anyone who has a severe allergy to any component of PPSV should not receive it. Tell your provider if you have any severe allergies.  Anyone who is moderately or severely ill when the shot is scheduled may be asked to wait until they recover before getting the vaccine. Someone with a mild illness can usually be vaccinated.  Children less than 13 years of age should not receive this vaccine.  There is no evidence that PPSV is harmful to either a pregnant woman or to her fetus. However, as a precaution, women who need the vaccine should be vaccinated before becoming pregnant, if possible. 4. Risks of a vaccine reaction With any medicine, including vaccines, there is a chance of side effects. These are usually mild and go away on their own, but serious reactions are also possible. About half of people who get PPSV have mild side effects, such as redness  or pain where the shot is given, which go away within about two days. Less than 1 out of 100 people develop a fever, muscle aches, or more severe local reactions. Problems that could happen after any vaccine:  People sometimes faint  after a medical procedure, including vaccination. Sitting or lying down for about 15 minutes can help prevent fainting, and injuries caused by a fall. Tell your doctor if you feel dizzy, or have vision changes or ringing in the ears.  Some people get severe pain in the shoulder and have difficulty moving the arm where a shot was given. This happens very rarely.  Any medication can cause a severe allergic reaction. Such reactions from a vaccine are very rare, estimated at about 1 in a million doses, and would happen within a few minutes to a few hours after the vaccination. As with any medicine, there is a very remote chance of a vaccine causing a serious injury or death. The safety of vaccines is always being monitored. For more information, visit: http://www.aguilar.org/ 5. What if there is a serious reaction? What should I look for? Look for anything that concerns you, such as signs of a severe allergic reaction, very high fever, or unusual behavior. Signs of a severe allergic reaction can include hives, swelling of the face and throat, difficulty breathing, a fast heartbeat, dizziness, and weakness. These would usually start a few minutes to a few hours after the vaccination. What should I do? If you think it is a severe allergic reaction or other emergency that can't wait, call 9-1-1 or get to the nearest hospital. Otherwise, call your doctor. Afterward, the reaction should be reported to the Vaccine Adverse Event Reporting System (VAERS). Your doctor might file this report, or you can do it yourself through the VAERS web site at www.vaers.SamedayNews.es, or by calling 7653253018. VAERS does not give medical advice. 6. How can I learn more?  Ask your doctor. He or she can give you the vaccine package insert or suggest other sources of information.  Call your local or state health department.  Contact the Centers for Disease Control and Prevention (CDC): ? Call 814 298 2751  (1-800-CDC-INFO) or ? Visit CDC's website at http://hunter.com/ CDC Pneumococcal Polysaccharide Vaccine VIS (03/06/14) This information is not intended to replace advice given to you by your health care provider. Make sure you discuss any questions you have with your health care provider. Document Released: 08/27/2006 Document Revised: 07/20/2016 Document Reviewed: 07/20/2016 Elsevier Interactive Patient Education  2017 [place] Maintenance, Male A healthy lifestyle and preventive care is important for your health and wellness. Ask your health care provider about what schedule of regular examinations is right for you. What should I know about weight and diet? Eat a Healthy Diet  Eat plenty of vegetables, fruits, whole grains, low-fat dairy products, and lean protein.  Do not eat a lot of foods high in solid fats, added sugars, or salt.  Maintain a Healthy Weight Regular exercise can help you achieve or maintain a healthy weight. You should:  Do at least 150 minutes of exercise each week. The exercise should increase your heart rate and make you sweat (moderate-intensity exercise).  Do strength-training exercises at least twice a week.  Watch Your Levels of Cholesterol and Blood Lipids  Have your blood tested for lipids and cholesterol every 5 years starting at 82 years of age. If you are at high risk for heart disease, you should start having your blood tested when you are  82 years old. You may need to have your cholesterol levels checked more often if: ? Your lipid or cholesterol levels are high. ? You are older than 82 years of age. ? You are at high risk for heart disease.  What should I know about cancer screening? Many types of cancers can be detected early and may often be prevented. Lung Cancer  You should be screened every year for lung cancer if: ? You are a current smoker who has smoked for at least 30 years. ? You are a former smoker who has quit within  the past 15 years.  Talk to your health care provider about your screening options, when you should start screening, and how often you should be screened.  Colorectal Cancer  Routine colorectal cancer screening usually begins at 82 years of age and should be repeated every 5-10 years until you are 82 years old. You may need to be screened more often if early forms of precancerous polyps or small growths are found. Your health care provider may recommend screening at an earlier age if you have risk factors for colon cancer.  Your health care provider may recommend using home test kits to check for hidden blood in the stool.  A small camera at the end of a tube can be used to examine your colon (sigmoidoscopy or colonoscopy). This checks for the earliest forms of colorectal cancer.  Prostate and Testicular Cancer  Depending on your age and overall health, your health care provider may do certain tests to screen for prostate and testicular cancer.  Talk to your health care provider about any symptoms or concerns you have about testicular or prostate cancer.  Skin Cancer  Check your skin from head to toe regularly.  Tell your health care provider about any new moles or changes in moles, especially if: ? There is a change in a mole's size, shape, or color. ? You have a mole that is larger than a pencil eraser.  Always use sunscreen. Apply sunscreen liberally and repeat throughout the day.  Protect yourself by wearing long sleeves, pants, a wide-brimmed hat, and sunglasses when outside.  What should I know about heart disease, diabetes, and high blood pressure?  If you are 66-45 years of age, have your blood pressure checked every 3-5 years. If you are 61 years of age or older, have your blood pressure checked every year. You should have your blood pressure measured twice-once when you are at a hospital or clinic, and once when you are not at a hospital or clinic. Record the average of the  two measurements. To check your blood pressure when you are not at a hospital or clinic, you can use: ? An automated blood pressure machine at a pharmacy. ? A home blood pressure monitor.  Talk to your health care provider about your target blood pressure.  If you are between 23-43 years old, ask your health care provider if you should take aspirin to prevent heart disease.  Have regular diabetes screenings by checking your fasting blood sugar level. ? If you are at a normal weight and have a low risk for diabetes, have this test once every three years after the age of 6. ? If you are overweight and have a high risk for diabetes, consider being tested at a younger age or more often.  A one-time screening for abdominal aortic aneurysm (AAA) by ultrasound is recommended for men aged 48-75 years who are current or former smokers. What should  I know about preventing infection? Hepatitis B If you have a higher risk for hepatitis B, you should be screened for this virus. Talk with your health care provider to find out if you are at risk for hepatitis B infection. Hepatitis C Blood testing is recommended for:  Everyone born from [birth year] through 1963.  Anyone with known risk factors for hepatitis C.  Sexually Transmitted Diseases (STDs)  You should be screened each year for STDs including gonorrhea and chlamydia if: ? You are sexually active and are younger than 82 years of age. ? You are older than 82 years of age and your health care provider tells you that you are at risk for this type of infection. ? Your sexual activity has changed since you were last screened and you are at an increased risk for chlamydia or gonorrhea. Ask your health care provider if you are at risk.  Talk with your health care provider about whether you are at high risk of being infected with HIV. Your health care provider may recommend a prescription medicine to help prevent HIV infection.  What else can I  do?  Schedule regular health, dental, and eye exams.  Stay current with your vaccines (immunizations).  Do not use any tobacco products, such as cigarettes, chewing tobacco, and e-cigarettes. If you need help quitting, ask your health care provider.  Limit alcohol intake to no more than 2 drinks per day. One drink equals 12 ounces of beer, 5 ounces of wine, or 1 ounces of hard liquor.  Do not use street drugs.  Do not share needles.  Ask your health care provider for help if you need support or information about quitting drugs.  Tell your health care provider if you often feel depressed.  Tell your health care provider if you have ever been abused or do not feel safe at home. This information is not intended to replace advice given to you by your health care provider. Make sure you discuss any questions you have with your health care provider. Document Released: 04/27/2008 Document Revised: 06/28/2016 Document Reviewed: 08/03/2015 Elsevier Interactive Patient Education  [employer].

## 2017-12-25 NOTE — Assessment & Plan Note (Signed)
Stable except when he eats something he shouldn't. Work on diet. Continue to monitor. Call with any concerns.

## 2017-12-25 NOTE — Assessment & Plan Note (Signed)
Stable. Continue to follow with cardiology. Call with any concerns.  

## 2017-12-25 NOTE — Assessment & Plan Note (Signed)
Checking CBC today- continue to follow with GI. Call with any concerns.

## 2017-12-25 NOTE — Assessment & Plan Note (Signed)
Under good control. Continue current regimen. Continue to monitor. Call with any concerns. 

## 2017-12-25 NOTE — Assessment & Plan Note (Signed)
Follows with pain doctor. Continue to monitor. Continue tramadol PRN. Call with any concerns.

## 2017-12-25 NOTE — Assessment & Plan Note (Signed)
Rechecking labs today. Call with any concerns.

## 2017-12-25 NOTE — Assessment & Plan Note (Signed)
Offered help with quitting, not interested at this time. Will consider changing to vaping.

## 2017-12-26 ENCOUNTER — Encounter: Payer: Self-pay | Admitting: Family Medicine

## 2017-12-26 LAB — COMPREHENSIVE METABOLIC PANEL
ALBUMIN: 4.4 g/dL (ref 3.5–4.7)
ALT: 14 IU/L (ref 0–44)
AST: 17 IU/L (ref 0–40)
Albumin/Globulin Ratio: 1.7 (ref 1.2–2.2)
Alkaline Phosphatase: 84 IU/L (ref 39–117)
BUN / CREAT RATIO: 10 (ref 10–24)
BUN: 8 mg/dL (ref 8–27)
Bilirubin Total: 0.3 mg/dL (ref 0.0–1.2)
CALCIUM: 9.5 mg/dL (ref 8.6–10.2)
CO2: 24 mmol/L (ref 20–29)
CREATININE: 0.82 mg/dL (ref 0.76–1.27)
Chloride: 108 mmol/L — ABNORMAL HIGH (ref 96–106)
GFR calc Af Amer: 95 mL/min/{1.73_m2} (ref >59)
GFR, EST NON AFRICAN AMERICAN: 82 mL/min/{1.73_m2} (ref >59)
GLOBULIN, TOTAL: 2.6 g/dL (ref 1.5–4.5)
Glucose: 93 mg/dL (ref 65–99)
Potassium: 4.2 mmol/L (ref 3.5–5.2)
Sodium: 146 mmol/L — ABNORMAL HIGH (ref 134–144)
Total Protein: 7 g/dL (ref 6.0–8.5)

## 2017-12-26 LAB — CBC WITH DIFFERENTIAL/PLATELET
Basophils Absolute: 0 10*3/uL (ref 0.0–0.2)
Basos: 0 %
EOS (ABSOLUTE): 0.2 10*3/uL (ref 0.0–0.4)
EOS: 3 %
HEMATOCRIT: 42.4 % (ref 37.5–51.0)
HEMOGLOBIN: 13.9 g/dL (ref 13.0–17.7)
IMMATURE GRANS (ABS): 0 10*3/uL (ref 0.0–0.1)
IMMATURE GRANULOCYTES: 0 %
LYMPHS: 28 %
Lymphocytes Absolute: 1.7 10*3/uL (ref 0.7–3.1)
MCH: 31 pg (ref 26.6–33.0)
MCHC: 32.8 g/dL (ref 31.5–35.7)
MCV: 95 fL (ref 79–97)
MONOCYTES: 7 %
Monocytes Absolute: 0.5 10*3/uL (ref 0.1–0.9)
NEUTROS PCT: 62 %
Neutrophils Absolute: 3.8 10*3/uL (ref 1.4–7.0)
Platelets: 175 10*3/uL (ref 150–379)
RBC: 4.48 x10E6/uL (ref 4.14–5.80)
RDW: 15.1 % (ref 12.3–15.4)
WBC: 6.2 10*3/uL (ref 3.4–10.8)

## 2017-12-26 LAB — LIPID PANEL W/O CHOL/HDL RATIO
Cholesterol, Total: 135 mg/dL (ref 100–199)
HDL: 69 mg/dL (ref >39)
LDL CALC: 54 mg/dL (ref 0–99)
Triglycerides: 61 mg/dL (ref 0–149)
VLDL Cholesterol Cal: 12 mg/dL (ref 5–40)

## 2017-12-26 LAB — TSH: TSH: 2.73 u[IU]/mL (ref 0.450–4.500)

## 2018-01-01 ENCOUNTER — Ambulatory Visit
Admission: RE | Admit: 2018-01-01 | Discharge: 2018-01-01 | Disposition: A | Discharge status: Home / Self Care | Payer: Medicare HMO | Source: Ambulatory Visit | Attending: Family Medicine | Admitting: Family Medicine

## 2018-01-01 ENCOUNTER — Telehealth: Payer: Self-pay | Admitting: Family Medicine

## 2018-01-01 DIAGNOSIS — I700 Atherosclerosis of aorta: Secondary | ICD-10-CM | POA: Diagnosis not present

## 2018-01-01 DIAGNOSIS — I714 Abdominal aortic aneurysm, without rupture, unspecified: Secondary | ICD-10-CM

## 2018-01-01 DIAGNOSIS — N281 Cyst of kidney, acquired: Secondary | ICD-10-CM | POA: Diagnosis not present

## 2018-01-01 DIAGNOSIS — N200 Calculus of kidney: Secondary | ICD-10-CM | POA: Diagnosis not present

## 2018-01-01 NOTE — Telephone Encounter (Signed)
Please let him know that his AAA has not gotten any bigger and since it's been so stable we can check it in 2 years insteady of 1 now.

## 2018-01-01 NOTE — Telephone Encounter (Signed)
Patients wife notified

## 2018-01-21 DIAGNOSIS — G894 Chronic pain syndrome: Secondary | ICD-10-CM | POA: Diagnosis not present

## 2018-01-21 DIAGNOSIS — R69 Illness, unspecified: Secondary | ICD-10-CM | POA: Diagnosis not present

## 2018-01-21 DIAGNOSIS — Z79899 Other long term (current) drug therapy: Secondary | ICD-10-CM | POA: Diagnosis not present

## 2018-01-21 DIAGNOSIS — F1120 Opioid dependence, uncomplicated: Secondary | ICD-10-CM | POA: Diagnosis not present

## 2018-01-21 DIAGNOSIS — M545 Low back pain: Secondary | ICD-10-CM | POA: Diagnosis not present

## 2018-02-06 DIAGNOSIS — E1169 Type 2 diabetes mellitus with other specified complication: Secondary | ICD-10-CM | POA: Diagnosis not present

## 2018-02-06 DIAGNOSIS — I10 Essential (primary) hypertension: Secondary | ICD-10-CM | POA: Diagnosis not present

## 2018-02-06 DIAGNOSIS — R69 Illness, unspecified: Secondary | ICD-10-CM | POA: Diagnosis not present

## 2018-02-26 ENCOUNTER — Other Ambulatory Visit (INDEPENDENT_AMBULATORY_CARE_PROVIDER_SITE_OTHER): Payer: Medicare HMO

## 2018-02-26 DIAGNOSIS — D509 Iron deficiency anemia, unspecified: Secondary | ICD-10-CM

## 2018-02-26 LAB — CBC WITH DIFFERENTIAL/PLATELET
Basophils Absolute: 0 10*3/uL (ref 0.0–0.1)
Basophils Relative: 0.4 % (ref 0.0–3.0)
Eosinophils Absolute: 0.3 10*3/uL (ref 0.0–0.7)
Eosinophils Relative: 4.1 % (ref 0.0–5.0)
HCT: 43.5 % (ref 39.0–52.0)
Hemoglobin: 14.7 g/dL (ref 13.0–17.0)
Lymphocytes Relative: 27.6 % (ref 12.0–46.0)
Lymphs Abs: 1.9 10*3/uL (ref 0.7–4.0)
MCHC: 33.9 g/dL (ref 30.0–36.0)
MCV: 95 fl (ref 78.0–100.0)
Monocytes Absolute: 0.6 10*3/uL (ref 0.1–1.0)
Monocytes Relative: 8.3 % (ref 3.0–12.0)
Neutro Abs: 4.1 10*3/uL (ref 1.4–7.7)
Neutrophils Relative %: 59.6 % (ref 43.0–77.0)
Platelets: 142 10*3/uL — ABNORMAL LOW (ref 150.0–400.0)
RBC: 4.58 Mil/uL (ref 4.22–5.81)
RDW: 15.1 % (ref 11.5–15.5)
WBC: 6.9 10*3/uL (ref 4.0–10.5)

## 2018-02-26 LAB — FERRITIN: Ferritin: 11.2 ng/mL — ABNORMAL LOW (ref 22.0–322.0)

## 2018-02-27 ENCOUNTER — Other Ambulatory Visit: Payer: Self-pay

## 2018-02-27 DIAGNOSIS — D509 Iron deficiency anemia, unspecified: Secondary | ICD-10-CM

## 2018-03-26 DIAGNOSIS — I10 Essential (primary) hypertension: Secondary | ICD-10-CM | POA: Diagnosis not present

## 2018-03-26 DIAGNOSIS — E785 Hyperlipidemia, unspecified: Secondary | ICD-10-CM | POA: Diagnosis not present

## 2018-03-26 DIAGNOSIS — I2510 Atherosclerotic heart disease of native coronary artery without angina pectoris: Secondary | ICD-10-CM | POA: Diagnosis not present

## 2018-03-26 DIAGNOSIS — I351 Nonrheumatic aortic (valve) insufficiency: Secondary | ICD-10-CM | POA: Diagnosis not present

## 2018-04-05 DIAGNOSIS — I252 Old myocardial infarction: Secondary | ICD-10-CM | POA: Diagnosis not present

## 2018-04-05 DIAGNOSIS — E785 Hyperlipidemia, unspecified: Secondary | ICD-10-CM | POA: Diagnosis not present

## 2018-04-05 DIAGNOSIS — Z8249 Family history of ischemic heart disease and other diseases of the circulatory system: Secondary | ICD-10-CM | POA: Diagnosis not present

## 2018-04-05 DIAGNOSIS — R69 Illness, unspecified: Secondary | ICD-10-CM | POA: Diagnosis not present

## 2018-04-05 DIAGNOSIS — I10 Essential (primary) hypertension: Secondary | ICD-10-CM | POA: Diagnosis not present

## 2018-04-05 DIAGNOSIS — Z7982 Long term (current) use of aspirin: Secondary | ICD-10-CM | POA: Diagnosis not present

## 2018-04-05 DIAGNOSIS — Z7902 Long term (current) use of antithrombotics/antiplatelets: Secondary | ICD-10-CM | POA: Diagnosis not present

## 2018-04-05 DIAGNOSIS — Z720 Tobacco use: Secondary | ICD-10-CM | POA: Diagnosis not present

## 2018-04-05 DIAGNOSIS — K219 Gastro-esophageal reflux disease without esophagitis: Secondary | ICD-10-CM | POA: Diagnosis not present

## 2018-04-05 DIAGNOSIS — G8929 Other chronic pain: Secondary | ICD-10-CM | POA: Diagnosis not present

## 2018-04-30 DIAGNOSIS — R69 Illness, unspecified: Secondary | ICD-10-CM | POA: Diagnosis not present

## 2018-04-30 DIAGNOSIS — E1169 Type 2 diabetes mellitus with other specified complication: Secondary | ICD-10-CM | POA: Diagnosis not present

## 2018-04-30 DIAGNOSIS — I10 Essential (primary) hypertension: Secondary | ICD-10-CM | POA: Diagnosis not present

## 2018-05-07 ENCOUNTER — Other Ambulatory Visit: Payer: Self-pay | Admitting: Internal Medicine

## 2018-05-20 DIAGNOSIS — Z79899 Other long term (current) drug therapy: Secondary | ICD-10-CM | POA: Diagnosis not present

## 2018-05-20 DIAGNOSIS — M545 Low back pain: Secondary | ICD-10-CM | POA: Diagnosis not present

## 2018-05-20 DIAGNOSIS — G894 Chronic pain syndrome: Secondary | ICD-10-CM | POA: Diagnosis not present

## 2018-05-20 DIAGNOSIS — R69 Illness, unspecified: Secondary | ICD-10-CM | POA: Diagnosis not present

## 2018-05-20 DIAGNOSIS — F1120 Opioid dependence, uncomplicated: Secondary | ICD-10-CM | POA: Diagnosis not present

## 2018-06-03 ENCOUNTER — Other Ambulatory Visit (INDEPENDENT_AMBULATORY_CARE_PROVIDER_SITE_OTHER): Payer: Medicare HMO

## 2018-06-03 DIAGNOSIS — D509 Iron deficiency anemia, unspecified: Secondary | ICD-10-CM | POA: Diagnosis not present

## 2018-06-03 LAB — CBC WITH DIFFERENTIAL/PLATELET
Basophils Absolute: 0 10*3/uL (ref 0.0–0.1)
Basophils Relative: 0.7 % (ref 0.0–3.0)
Eosinophils Absolute: 0.4 10*3/uL (ref 0.0–0.7)
Eosinophils Relative: 6.2 % — ABNORMAL HIGH (ref 0.0–5.0)
HEMATOCRIT: 41.8 % (ref 39.0–52.0)
HEMOGLOBIN: 14.1 g/dL (ref 13.0–17.0)
LYMPHS PCT: 43.6 % (ref 12.0–46.0)
Lymphs Abs: 3 10*3/uL (ref 0.7–4.0)
MCHC: 33.7 g/dL (ref 30.0–36.0)
MCV: 98.4 fl (ref 78.0–100.0)
Monocytes Absolute: 0.6 10*3/uL (ref 0.1–1.0)
Monocytes Relative: 8.6 % (ref 3.0–12.0)
Neutro Abs: 2.8 10*3/uL (ref 1.4–7.7)
Neutrophils Relative %: 40.9 % — ABNORMAL LOW (ref 43.0–77.0)
Platelets: 143 10*3/uL — ABNORMAL LOW (ref 150.0–400.0)
RBC: 4.25 Mil/uL (ref 4.22–5.81)
RDW: 13.4 % (ref 11.5–15.5)
WBC: 6.8 10*3/uL (ref 4.0–10.5)

## 2018-06-03 LAB — IBC PANEL
Iron: 208 ug/dL — ABNORMAL HIGH (ref 42–165)
SATURATION RATIOS: 66.6 % — AB (ref 20.0–50.0)
Transferrin: 223 mg/dL (ref 212.0–360.0)

## 2018-06-03 LAB — FERRITIN: Ferritin: 20.1 ng/mL — ABNORMAL LOW (ref 22.0–322.0)

## 2018-06-19 ENCOUNTER — Ambulatory Visit (INDEPENDENT_AMBULATORY_CARE_PROVIDER_SITE_OTHER): Payer: Medicare HMO

## 2018-06-19 VITALS — BP 132/78 | HR 78 | Temp 98.1°F | Resp 16 | Ht 66.0 in | Wt 149.5 lb

## 2018-06-19 DIAGNOSIS — Z0000 Encounter for general adult medical examination without abnormal findings: Secondary | ICD-10-CM | POA: Diagnosis not present

## 2018-06-19 NOTE — Patient Instructions (Signed)
Brian [name] , Thank you for taking time to come for your Medicare Wellness Visit. I appreciate your ongoing commitment to your health goals. Please review the following plan we discussed and let me know if I can assist you in the future.   Screening recommendations/referrals: Colonoscopy: no longer required Recommended yearly ophthalmology/optometry visit for glaucoma screening and checkup Recommended yearly dental visit for hygiene and checkup  Vaccinations: Influenza vaccine: due 07/2018  Pneumococcal vaccine: up to date Tdap vaccine: up to date Shingles vaccine: shingrix eligible, check with your insurance company for coverage     Advanced directives: Please bring a copy of your health care power of attorney and living will to the office at your convenience.  Conditions/risks identified: smoking cessation discussed  Next appointment: Follow up on 06/26/2018 at 9:30am with Dr.Johnson. Follow up in one year for your annual wellness exam.   Preventive Care 65 Years and Older, Male Preventive care refers to lifestyle choices and visits with your health care provider that can promote health and wellness. What does preventive care include?  A yearly physical exam. This is also called an annual well check.  Dental exams once or twice a year.  Routine eye exams. Ask your health care provider how often you should have your eyes checked.  Personal lifestyle choices, including:  Daily care of your teeth and gums.  Regular physical activity.  Eating a healthy diet.  Avoiding tobacco and drug use.  Limiting alcohol use.  Practicing safe sex.  Taking low doses of aspirin every day.  Taking vitamin and mineral supplements as recommended by your health care provider. What happens during an annual well check? The services and screenings done by your health care provider during your annual well check will depend on your age, overall health, lifestyle risk factors, and family history of  disease. Counseling  Your health care provider may ask you questions about your:  Alcohol use.  Tobacco use.  Drug use.  Emotional well-being.  Home and relationship well-being.  Sexual activity.  Eating habits.  History of falls.  Memory and ability to understand (cognition).  Work and work Statistician. Screening  You may have the following tests or measurements:  Height, weight, and BMI.  Blood pressure.  Lipid and cholesterol levels. These may be checked every 5 years, or more frequently if you are over 9 years old.  Skin check.  Lung cancer screening. You may have this screening every year starting at age 56 if you have a 30-pack-year history of smoking and currently smoke or have quit within the past 15 years.  Fecal occult blood test (FOBT) of the stool. You may have this test every year starting at age 59.  Flexible sigmoidoscopy or colonoscopy. You may have a sigmoidoscopy every 5 years or a colonoscopy every 10 years starting at age 56.  Prostate cancer screening. Recommendations will vary depending on your family history and other risks.  Hepatitis C blood test.  Hepatitis B blood test.  Sexually transmitted disease (STD) testing.  Diabetes screening. This is done by checking your blood sugar (glucose) after you have not eaten for a while (fasting). You may have this done every 1-3 years.  Abdominal aortic aneurysm (AAA) screening. You may need this if you are a current or former smoker.  Osteoporosis. You may be screened starting at age 57 if you are at high risk. Talk with your health care provider about your test results, treatment options, and if necessary, the need for more tests.  Vaccines  Your health care provider may recommend certain vaccines, such as:  Influenza vaccine. This is recommended every year.  Tetanus, diphtheria, and acellular pertussis (Tdap, Td) vaccine. You may need a Td booster every 10 years.  Zoster vaccine. You may  need this after age 55.  Pneumococcal 13-valent conjugate (PCV13) vaccine. One dose is recommended after age 64.  Pneumococcal polysaccharide (PPSV23) vaccine. One dose is recommended after age 43. Talk to your health care provider about which screenings and vaccines you need and how often you need them. This information is not intended to replace advice given to you by your health care provider. Make sure you discuss any questions you have with your health care provider. Document Released: 11/26/2015 Document Revised: 07/19/2016 Document Reviewed: 08/31/2015 Elsevier Interactive Patient Education  2017 [place] Prevention in the Home Falls can cause injuries. They can happen to people of all ages. There are many things you can do to make your home safe and to help prevent falls. What can I do on the outside of my home?  Regularly fix the edges of walkways and driveways and fix any cracks.  Remove anything that might make you trip as you walk through a door, such as a raised step or threshold.  Trim any bushes or trees on the path to your home.  Use bright outdoor lighting.  Clear any walking paths of anything that might make someone trip, such as rocks or tools.  Regularly check to see if handrails are loose or broken. Make sure that both sides of any steps have handrails.  Any raised decks and porches should have guardrails on the edges.  Have any leaves, snow, or ice cleared regularly.  Use sand or salt on walking paths during winter.  Clean up any spills in your garage right away. This includes oil or grease spills. What can I do in the bathroom?  Use night lights.  Install grab bars by the toilet and in the tub and shower. Do not use towel bars as grab bars.  Use non-skid mats or decals in the tub or shower.  If you need to sit down in the shower, use a plastic, non-slip stool.  Keep the floor dry. Clean up any water that spills on the floor as soon as it  happens.  Remove soap buildup in the tub or shower regularly.  Attach bath mats securely with double-sided non-slip rug tape.  Do not have throw rugs and other things on the floor that can make you trip. What can I do in the bedroom?  Use night lights.  Make sure that you have a light by your bed that is easy to reach.  Do not use any sheets or blankets that are too big for your bed. They should not hang down onto the floor.  Have a firm chair that has side arms. You can use this for support while you get dressed.  Do not have throw rugs and other things on the floor that can make you trip. What can I do in the kitchen?  Clean up any spills right away.  Avoid walking on wet floors.  Keep items that you use a lot in easy-to-reach places.  If you need to reach something above you, use a strong step stool that has a grab bar.  Keep electrical cords out of the way.  Do not use floor polish or wax that makes floors slippery. If you must use wax, use non-skid floor wax.  Do not have throw rugs and other things on the floor that can make you trip. What can I do with my stairs?  Do not leave any items on the stairs.  Make sure that there are handrails on both sides of the stairs and use them. Fix handrails that are broken or loose. Make sure that handrails are as long as the stairways.  Check any carpeting to make sure that it is firmly attached to the stairs. Fix any carpet that is loose or worn.  Avoid having throw rugs at the top or bottom of the stairs. If you do have throw rugs, attach them to the floor with carpet tape.  Make sure that you have a light switch at the top of the stairs and the bottom of the stairs. If you do not have them, ask someone to add them for you. What else can I do to help prevent falls?  Wear shoes that:  Do not have high heels.  Have rubber bottoms.  Are comfortable and fit you well.  Are closed at the toe. Do not wear sandals.  If you  use a stepladder:  Make sure that it is fully opened. Do not climb a closed stepladder.  Make sure that both sides of the stepladder are locked into place.  Ask someone to hold it for you, if possible.  Clearly mark and make sure that you can see:  Any grab bars or handrails.  First and last steps.  Where the edge of each step is.  Use tools that help you move around (mobility aids) if they are needed. These include:  Canes.  Walkers.  Scooters.  Crutches.  Turn on the lights when you go into a dark area. Replace any light bulbs as soon as they burn out.  Set up your furniture so you have a clear path. Avoid moving your furniture around.  If any of your floors are uneven, fix them.  If there are any pets around you, be aware of where they are.  Review your medicines with your doctor. Some medicines can make you feel dizzy. This can increase your chance of falling. Ask your doctor what other things that you can do to help prevent falls. This information is not intended to replace advice given to you by your health care provider. Make sure you discuss any questions you have with your health care provider. Document Released: 08/26/2009 Document Revised: 04/06/2016 Document Reviewed: 12/04/2014 Elsevier Interactive Patient Education  2017 [employer].    Steps to Quit Smoking Smoking tobacco can be bad for your health. It can also affect almost every organ in your body. Smoking puts you and people around you at risk for many serious long-lasting (chronic) diseases. Quitting smoking is hard, but it is one of the best things that you can do for your health. It is never too late to quit. What are the benefits of quitting smoking? When you quit smoking, you lower your risk for getting serious diseases and conditions. They can include:  Lung cancer or lung disease.  Heart disease.  Stroke.  Heart attack.  Not being able to have children (infertility).  Weak bones  (osteoporosis) and broken bones (fractures).  If you have coughing, wheezing, and shortness of breath, those symptoms may get better when you quit. You may also get sick less often. If you are pregnant, quitting smoking can help to lower your chances of having a baby of low birth weight. What can I do to  help me quit smoking? Talk with your doctor about what can help you quit smoking. Some things you can do (strategies) include:  Quitting smoking totally, instead of slowly cutting back how much you smoke over a period of time.  Going to in-person counseling. You are more likely to quit if you go to many counseling sessions.  Using resources and support systems, such as: ? Database administrator with a Social worker. ? Phone quitlines. ? Careers information officer. ? Support groups or group counseling. ? Text messaging programs. ? Mobile phone apps or applications.  Taking medicines. Some of these medicines may have nicotine in them. If you are pregnant or breastfeeding, do not take any medicines to quit smoking unless your doctor says it is okay. Talk with your doctor about counseling or other things that can help you.  Talk with your doctor about using more than one strategy at the same time, such as taking medicines while you are also going to in-person counseling. This can help make quitting easier. What things can I do to make it easier to quit? Quitting smoking might feel very hard at first, but there is a lot that you can do to make it easier. Take these steps:  Talk to your family and friends. Ask them to support and encourage you.  Call phone quitlines, reach out to support groups, or work with a Social worker.  Ask people who smoke to not smoke around you.  Avoid places that make you want (trigger) to smoke, such as: ? Bars. ? Parties. ? Smoke-break areas at work.  Spend time with people who do not smoke.  Lower the stress in your life. Stress can make you want to smoke. Try these things  to help your stress: ? Getting regular exercise. ? Deep-breathing exercises. ? Yoga. ? Meditating. ? Doing a body scan. To do this, close your eyes, focus on one area of your body at a time from head to toe, and notice which parts of your body are tense. Try to relax the muscles in those areas.  Download or buy apps on your mobile phone or tablet that can help you stick to your quit plan. There are many free apps, such as QuitGuide from the [employer] Office manager for Disease Control and Prevention). You can find more support from smokefree.gov and other websites.  This information is not intended to replace advice given to you by your health care provider. Make sure you discuss any questions you have with your health care provider. Document Released: 08/26/2009 Document Revised: 06/27/2016 Document Reviewed: 03/16/2015 Elsevier Interactive Patient Education  2018 [employer].

## 2018-06-19 NOTE — Progress Notes (Signed)
Subjective:   Brian [name] is a 84 y.o. male who presents for Medicare Annual/Subsequent preventive examination.  Review of Systems:  Cardiac Risk Factors include: advanced age (>67men, >57 women);hypertension;dyslipidemia;male gender     Objective:    Vitals: BP 132/78 (BP Location: Left Arm, Patient Position: Sitting)   Pulse 78   Temp 98.1 F (36.7 C) (Temporal)   Resp 16   Ht 5\' 6"  (1.676 m)   Wt 149 lb 8 oz (67.8 kg)   BMI 24.13 kg/m   Body mass index is 24.13 kg/m.  Advanced Directives 06/19/2018 12/18/2016 10/29/2016 10/28/2016 06/05/2015 05/03/2015 05/31/2014  Does Patient Have a Medical Advance Directive? Yes Yes Yes Yes Yes Yes Patient does not have advance directive;Patient would not like information  Type of Advance Directive Living will;Healthcare Power of Attorney Living will;Healthcare Power of [place];Living will [place];Living will - [place] -  Does patient want to make changes to medical advance directive? - No - Patient declined No - Patient declined No - Patient declined No - Patient declined No - Patient declined -  Copy of [place] in Chart? No - copy requested No - copy requested No - copy requested No - copy requested - - -    Tobacco Social History   Tobacco Use  Smoking Status Current Every Day Smoker  . Packs/day: 0.25  . Years: 60.00  . Pack years: 15.00  . Types: Cigarettes  Smokeless Tobacco Never Used     Ready to quit: No Counseling given: Yes   Clinical Intake:  Pre-visit preparation completed: Yes  Pain : No/denies pain     Nutritional Status: BMI of 19-24  Normal Nutritional Risks: Nausea/ vomitting/ diarrhea(nauseated occasionally ) Diabetes: No  How often do you need to have someone help you when you read instructions, pamphlets, or other written materials from your doctor or pharmacy?: 1 - Never What is the last grade level you  completed in school?: 12th grade  Interpreter Needed?: No  Information entered by :: Tiffany Hill,LPN   Past Medical History:  Diagnosis Date  . AAA (abdominal aortic aneurysm) (HCC)    2.9 CM infrarenal AAA on CT 11/2011  . Acute anterior wall MI ([place]) 05/29/2014  . Alcoholism ([place])   . Aneurysm of abdominal aorta (HCC)    SMALL  . Anxiety    panic attacks, rare.  . Aortic aneurysm (HCC)    small  . Arthritis   . Coronary artery disease    2 stents in july 2015  . Depression with anxiety   . Diverticulosis of colon 10/2009  . 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   . Gastritis 03/2012  . GERD (gastroesophageal reflux disease)   . GI bleed   . HFrEF (heart failure with reduced ejection fraction) ([place])   . HOH (hard of hearing)   . Hyperlipemia   . Hypertension   . Hypovolemic shock ([place])   . Kidney stones   . Myocardial infarction ([place]) 2015  . Panic attacks   . Positive H. pylori titer ?, before 2010   treated with Abx.   . Tubular adenoma of colon    Past Surgical History:  Procedure Laterality Date  . CARDIAC CATHETERIZATION  2015   Stents  . CATARACT EXTRACTION W/PHACO Right 09/21/2016   Procedure: CATARACT EXTRACTION PHACO AND INTRAOCULAR LENS PLACEMENT (IOC);  Surgeon: Eulogio Bear, MD;  Location: [facility] ORS;  Service: Ophthalmology;  Laterality: Right;  [place] 1.10 AP% 13.8 CDE 9.93 Fluid pack lot # [id number]  . CATARACT EXTRACTION W/PHACO Left 12/07/2016   Procedure: CATARACT EXTRACTION PHACO AND INTRAOCULAR LENS PLACEMENT (IOC);  Surgeon: Eulogio Bear, MD;  Location: [facility] ORS;  Service: Ophthalmology;  Laterality: Left;  [place] 00:42.7 ap 13.7 cde 5.85 lot #[id number]  . COLONOSCOPY WITH PROPOFOL Left 06/06/2015   Procedure: COLONOSCOPY WITH PROPOFOL;  Surgeon: Hulen Luster, MD;  [facility] ENDOSCOPY;  Service: Endoscopy;  Laterality: Left;  . CORONARY ANGIOPLASTY     2015  . LEFT HEART CATHETERIZATION WITH CORONARY ANGIOGRAM Bilateral 05/29/2014    Procedure: LEFT HEART CATHETERIZATION WITH CORONARY ANGIOGRAM;  Surgeon: Laverda Page, MD;  Location[facility] CATH LAB;  Service: Cardiovascular;  Laterality: Bilateral;  . PERCUTANEOUS CORONARY STENT INTERVENTION (PCI-S)  05/29/2014   Procedure: PERCUTANEOUS CORONARY STENT INTERVENTION (PCI-S);  Surgeon: Laverda Page, MD;  Location: [facility] CATH LAB;  Service: Cardiovascular;;  DES x2 Prox and Mid LAD    Family History  Problem Relation Age of Onset  . Ulcers Father        stomach   . Heart disease Father   . Heart disease Mother   . Anuerysm Brother   . Hypertension Daughter   . Heart disease Paternal Grandmother   . Heart attack Paternal Grandmother   . Heart disease Paternal Grandfather   . Colon cancer Neg Hx    Social History   Socioeconomic History  . Marital status: Married    Spouse name: Not on file  . Number of children: Not on file  . Years of education: Not on file  . Highest education level: High school graduate  Occupational History  . Not on file  Social Needs  . Financial resource strain: Not hard at all  . Food insecurity:    Worry: Never true    Inability: Never true  . Transportation needs:    Medical: No    Non-medical: No  Tobacco Use  . Smoking status: Current Every Day Smoker    Packs/day: 0.25    Years: 60.00    Pack years: 15.00    Types: Cigarettes  . Smokeless tobacco: Never Used  Substance and Sexual Activity  . Alcohol use: No    Alcohol/week: 0.0 oz  . Drug use: No  . Sexual activity: Never  Lifestyle  . Physical activity:    Days per week: 0 days    Minutes per session: 0 min  . Stress: Not at all  Relationships  . Social connections:    Talks on phone: Never    Gets together: More than three times a week    Attends religious service: More than 4 times per year    Active member of club or organization: Yes    Attends meetings of clubs or organizations: More than 4 times per year    Relationship status: Married  Other Topics  Concern  . Not on file  Social History Narrative   Working fulltime     Outpatient Encounter Medications as of 06/19/2018  Medication Sig  . aspirin 81 MG chewable tablet Chew 81 mg by mouth daily.  Marland Kitchen atorvastatin (LIPITOR) 80 MG tablet Take 1 tablet (80 mg total) by mouth daily at 6 PM.  . clopidogrel (PLAVIX) 75 MG tablet Take 75 mg by mouth daily.   . diazepam (VALIUM) 5 MG tablet Take 5 mg by mouth every 8 (eight) hours as needed (for anxiety).   . ferrous sulfate (  IRON SUPPLEMENT) 325 (65 FE) MG tablet Take 325 mg by mouth daily with breakfast. 3 times a week  . lisinopril (PRINIVIL,ZESTRIL) 5 MG tablet Take 5 mg by mouth daily.   . metoprolol succinate (TOPROL-XL) 25 MG 24 hr tablet Take 12.5 mg by mouth daily.   . ondansetron (ZOFRAN-ODT) 4 MG disintegrating tablet DISSOLVE 1 TABLET IN MOUTH EVERY 6 HOURS AS NEEDED FOR NAUSEA AND VOMITING (NAUSEA/UPPER  ABDOMINAL  PAIN)  . ranitidine (ZANTAC) 150 MG tablet TAKE 1 TABLET BY MOUTH TWICE DAILY  . traMADol (ULTRAM) 50 MG tablet Take 1 tablet (50 mg total) by mouth every 4 (four) hours as needed. (Patient taking differently: Take 50 mg by mouth every 4 (four) hours as needed (for pain). )  . nitroGLYCERIN (NITROSTAT) 0.4 MG SL tablet Place 0.4 mg under the tongue every 5 (five) minutes x 3 doses as needed for chest pain.   No facility-administered encounter medications on file as of 06/19/2018.     Activities of Daily Living In your present state of health, do you have any difficulty performing the following activities: 06/19/2018 12/25/2017  Hearing? Tempie Donning  Vision? N N  Difficulty concentrating or making decisions? Y N  Comment typically comes back  -  Walking or climbing stairs? N N  Dressing or bathing? N N  Doing errands, shopping? N N  Preparing Food and eating ? N -  Using the Toilet? N -  In the past six months, have you accidently leaked urine? N -  Do you have problems with loss of bowel control? N -  Managing your Medications?  N -  Managing your Finances? N -  Housekeeping or managing your Housekeeping? N -  Some recent data might be hidden    Patient Care Team: Valerie Roys, DO as PCP - General (Family Medicine) Pyrtle, Lajuan Lines, MD as Consulting Physician (Gastroenterology) Nathanial Rancher, MD as Referring Physician (Anesthesiology)   Assessment:   This is a routine wellness examination for [place].  Exercise Activities and Dietary recommendations Current Exercise Habits: Home exercise routine, Type of exercise: walking, Time (Minutes): 15, Frequency (Times/Week): 3, Weekly Exercise (Minutes/Week): 45, Intensity: Mild, Exercise limited by: None identified  Goals    . Quit Smoking     Smoking cessation discussed       Fall Risk Fall Risk  06/19/2018 12/25/2017 06/19/2017 12/18/2016 01/14/2016  Falls in the past year? No No Yes No Yes  Number falls in past yr: - - 2 or more - 1  Injury with Fall? - - No - No  Risk for fall due to : - - Medication side effect;Impaired balance/gait;Impaired mobility - -  Follow up - - Education provided;Falls prevention discussed - -   Is the patient's home free of loose throw rugs in walkways, pet beds, electrical cords, etc?   no      Grab bars in the bathroom? no      Handrails on the stairs?   yes      Adequate lighting?   yes  Timed Get Up and Go Performed: Completed in 8 seconds with no use of assistive devices, steady gait. No intervention needed at this time.   Depression Screen PHQ 2/9 Scores 06/19/2018 12/25/2017 06/19/2017 12/18/2016  PHQ - 2 Score 0 0 3 0  PHQ- 9 Score - 0 9 -    Cognitive Function     6CIT Screen 06/19/2018 12/25/2017 12/18/2016  What Year? 0 points 0 points 0 points  What  month? 0 points 0 points 0 points  What time? 0 points 0 points 0 points  Count back from 20 0 points 0 points 0 points  Months in reverse 0 points 0 points 0 points  Repeat phrase 4 points 0 points 0 points  Total Score 4 0 0    Immunization History  Administered Date(s)  Administered  . Influenza-Unspecified 07/15/2015, 07/14/2017  . Pneumococcal Conjugate-13 12/18/2016  . Pneumococcal Polysaccharide-23 12/25/2017    Qualifies for Shingles Vaccine? Yes, discussed shingrix vaccine   Screening Tests Health Maintenance  Topic Date Due  . INFLUENZA VACCINE  06/13/2018  . TETANUS/TDAP  11/14/2019  . PNA vac Low Risk Adult  Completed   Cancer Screenings: Lung: Low Dose CT Chest recommended if Age 49-80 years, 30 pack-year currently smoking OR have quit w/in 15years. Patient does not qualify. Colorectal: no longer required  Additional Screenings:  Hepatitis C Screening: not indicated       Plan:    I have personally reviewed and addressed the Medicare Annual Wellness questionnaire and have noted the following in the patient's chart:  A. Medical and social history B. Use of alcohol, tobacco or illicit drugs  C. Current medications and supplements D. Functional ability and status E.  Nutritional status F.  Physical activity G. Advance directives H. List of other physicians I.  Hospitalizations, surgeries, and ER visits in previous 12 months J.  [place] such as hearing and vision if needed, cognitive and depression L. Referrals and appointments   In addition, I have reviewed and discussed with patient certain preventive protocols, quality metrics, and best practice recommendations. A written personalized care plan for preventive services as well as general preventive health recommendations were provided to patient.   Signed,  Tyler Aas, LPN Nurse Health Advisor   Nurse Notes:none

## 2018-06-26 ENCOUNTER — Encounter: Payer: Self-pay | Admitting: Family Medicine

## 2018-06-26 ENCOUNTER — Ambulatory Visit (INDEPENDENT_AMBULATORY_CARE_PROVIDER_SITE_OTHER): Payer: Medicare HMO | Admitting: Family Medicine

## 2018-06-26 VITALS — BP 136/82 | HR 62 | Temp 97.6°F | Ht 66.5 in | Wt 147.4 lb

## 2018-06-26 DIAGNOSIS — I129 Hypertensive chronic kidney disease with stage 1 through stage 4 chronic kidney disease, or unspecified chronic kidney disease: Secondary | ICD-10-CM | POA: Diagnosis not present

## 2018-06-26 DIAGNOSIS — Z7902 Long term (current) use of antithrombotics/antiplatelets: Secondary | ICD-10-CM

## 2018-06-26 DIAGNOSIS — E782 Mixed hyperlipidemia: Secondary | ICD-10-CM | POA: Diagnosis not present

## 2018-06-26 LAB — CBC WITH DIFFERENTIAL/PLATELET
Hematocrit: 41 % (ref 37.5–51.0)
Hemoglobin: 14.6 g/dL (ref 13.0–17.7)
Lymphocytes Absolute: 2 10*3/uL (ref 0.7–3.1)
Lymphs: 32 %
MCH: 34.8 pg — AB (ref 26.6–33.0)
MCHC: 35.6 g/dL (ref 31.5–35.7)
MCV: 98 fL — AB (ref 79–97)
MID (Absolute): 0.9 10*3/uL (ref 0.1–1.6)
MID: 14 %
NEUTROS ABS: 3.6 10*3/uL (ref 1.4–7.0)
Neutrophils: 55 %
PLATELETS: 129 10*3/uL — AB (ref 150–450)
RBC: 4.2 x10E6/uL (ref 4.14–5.80)
RDW: 13.3 % (ref 12.3–15.4)
WBC: 6.5 10*3/uL (ref 3.4–10.8)

## 2018-06-26 MED ORDER — FERROUS SULFATE 325 (65 FE) MG PO TABS: 325.0000 mg | ORAL_TABLET | Freq: Every day | ORAL | 1 refills | Status: DC

## 2018-06-26 MED ORDER — ATORVASTATIN CALCIUM 80 MG PO TABS: 80.0000 mg | ORAL_TABLET | Freq: Every day | ORAL | 3 refills | Status: DC

## 2018-06-26 MED ORDER — LISINOPRIL 5 MG PO TABS: 5.0000 mg | ORAL_TABLET | Freq: Every day | ORAL | 1 refills | Status: DC

## 2018-06-26 MED ORDER — METOPROLOL SUCCINATE ER 25 MG PO TB24: 12.5000 mg | ORAL_TABLET | Freq: Every day | ORAL | 1 refills | Status: DC

## 2018-06-26 NOTE — Assessment & Plan Note (Signed)
Under good control on current regimen. Continue current regimen. Continue to monitor. Call with any concerns. Refills given.   

## 2018-06-26 NOTE — Progress Notes (Signed)
BP 136/82 (BP Location: Left Arm, Patient Position: Sitting, Cuff Size: Normal)   Pulse 62   Temp 97.6 F (36.4 C)   Ht 5' 6.5" (1.689 m)   Wt 147 lb 7 oz (66.9 kg)   SpO2 96%   BMI 23.44 kg/m    Subjective:    Patient ID: Brian [name], male    DOB: June 19, 1933, 84 y.o.   MRN: [id number]  HPI: Brian [name] is a 84 y.o. male  Chief Complaint  Patient presents with  . Hypertension  . Hyperlipidemia   HYPERTENSION / HYPERLIPIDEMIA Satisfied with current treatment? yes Duration of hypertension: chronic BP monitoring frequency: not checking BP range:  BP medication side effects: no Past BP meds: lisinopril Duration of hyperlipidemia: chronic Cholesterol medication side effects: no Cholesterol supplements: fish oil Past cholesterol medications: atrovastatin Medication compliance: excellent compliance Aspirin: yes Recent stressors: yes Recurrent headaches: no Visual changes: no Palpitations: no Dyspnea: no Chest pain: no Lower extremity edema: no Dizzy/lightheaded: no  Arthritis is stable. Doing well on tramadol. Gets it from Dr. Humphrey Rolls. Same with   Relevant past medical, surgical, family and social history reviewed and updated as indicated. Interim medical history since our last visit reviewed. Allergies and medications reviewed and updated.  Review of Systems  Constitutional: Negative.   Respiratory: Negative.   Cardiovascular: Negative.   Gastrointestinal: Negative.   Skin: Negative.   Psychiatric/Behavioral: Negative for agitation, behavioral problems, confusion, decreased concentration, dysphoric mood, hallucinations, self-injury, sleep disturbance and suicidal ideas. The patient is nervous/anxious. The patient is not hyperactive.     Per HPI unless specifically indicated above     Objective:    BP 136/82 (BP Location: Left Arm, Patient Position: Sitting, Cuff Size: Normal)   Pulse 62   Temp 97.6 F (36.4 C)   Ht 5' 6.5" (1.689 m)   Wt 147 lb 7 oz  (66.9 kg)   SpO2 96%   BMI 23.44 kg/m   Wt Readings from Last 3 Encounters:  06/26/18 147 lb 7 oz (66.9 kg)  06/19/18 149 lb 8 oz (67.8 kg)  12/25/17 152 lb 6 oz (69.1 kg)    Physical Exam  Constitutional: He is oriented to person, place, and time. He appears well-developed and well-nourished. No distress.  HENT:  Head: Normocephalic and atraumatic.  Right Ear: Hearing normal.  Left Ear: Hearing normal.  Nose: Nose normal.  Eyes: Conjunctivae and lids are normal. Right eye exhibits no discharge. Left eye exhibits no discharge. No scleral icterus.  Cardiovascular: Normal rate, regular rhythm, normal heart sounds and intact distal pulses. Exam reveals no gallop and no friction rub.  No murmur heard. Pulmonary/Chest: Effort normal and breath sounds normal. No stridor. No respiratory distress. He has no wheezes. He has no rales. He exhibits no tenderness.  Musculoskeletal: Normal range of motion.  Neurological: He is alert and oriented to person, place, and time.  Skin: Skin is warm, dry and intact. Capillary refill takes less than 2 seconds. No rash noted. He is not diaphoretic. No erythema. No pallor.  Psychiatric: He has a normal mood and affect. His speech is normal and behavior is normal. Judgment and thought content normal. Cognition and memory are normal.  Nursing note and vitals reviewed.   Results for orders placed or performed in visit on 06/26/18  CBC With Differential/Platelet  Result Value Ref Range   WBC 6.5 3.4 - 10.8 x10E3/uL   RBC 4.20 4.14 - 5.80 x10E6/uL   Hemoglobin 14.6 13.0 - 17.7  g/dL   Hematocrit 41.0 37.5 - 51.0 %   MCV 98 (H) 79 - 97 fL   MCH 34.8 (H) 26.6 - 33.0 pg   MCHC 35.6 31.5 - 35.7 g/dL   RDW 13.3 12.3 - 15.4 %   Platelets 129 (L) 150 - 450 x10E3/uL   Neutrophils 55 Not Estab. %   Lymphs 32 Not Estab. %   MID 14 Not Estab. %   Neutrophils Absolute 3.6 1.4 - 7.0 x10E3/uL   Lymphocytes Absolute 2.0 0.7 - 3.1 x10E3/uL   MID (Absolute) 0.9 0.1 -  1.6 X10E3/uL      Assessment & Plan:   Problem List Items Addressed This Visit      Genitourinary   Benign hypertensive renal disease - Primary    Under good control on current regimen. Continue current regimen. Continue to monitor. Call with any concerns. Refills given.        Relevant Orders   Comprehensive metabolic panel     Other   Hyperlipidemia    Under good control on current regimen. Continue current regimen. Continue to monitor. Call with any concerns. Refills given.        Relevant Medications   lisinopril (PRINIVIL,ZESTRIL) 5 MG tablet   metoprolol succinate (TOPROL-XL) 25 MG 24 hr tablet   atorvastatin (LIPITOR) 80 MG tablet   Other Relevant Orders   Comprehensive metabolic panel   Lipid Panel w/o Chol/HDL Ratio   Encounter for long-term (current) use of antiplatelets/antithrombotics    Checking CBC today. Await results. Call with any concerns.       Relevant Orders   CBC With Differential/Platelet (Completed)       Follow up plan: Return in about 6 months (around 12/27/2018) for 6 month follow up.

## 2018-06-26 NOTE — Assessment & Plan Note (Signed)
Checking CBC today. Await results. Call with any concerns.

## 2018-06-27 LAB — COMPREHENSIVE METABOLIC PANEL
ALBUMIN: 4.1 g/dL (ref 3.5–4.7)
ALK PHOS: 69 IU/L (ref 39–117)
ALT: 15 IU/L (ref 0–44)
AST: 16 IU/L (ref 0–40)
Albumin/Globulin Ratio: 1.9 (ref 1.2–2.2)
BUN / CREAT RATIO: 13 (ref 10–24)
BUN: 11 mg/dL (ref 8–27)
Bilirubin Total: 0.4 mg/dL (ref 0.0–1.2)
CHLORIDE: 105 mmol/L (ref 96–106)
CO2: 23 mmol/L (ref 20–29)
CREATININE: 0.88 mg/dL (ref 0.76–1.27)
Calcium: 9.2 mg/dL (ref 8.6–10.2)
GFR calc Af Amer: 92 mL/min/{1.73_m2} (ref >59)
GFR calc non Af Amer: 79 mL/min/{1.73_m2} (ref >59)
Globulin, Total: 2.2 g/dL (ref 1.5–4.5)
Glucose: 109 mg/dL — ABNORMAL HIGH (ref 65–99)
Potassium: 4.2 mmol/L (ref 3.5–5.2)
Sodium: 141 mmol/L (ref 134–144)
Total Protein: 6.3 g/dL (ref 6.0–8.5)

## 2018-06-27 LAB — LIPID PANEL W/O CHOL/HDL RATIO
CHOLESTEROL TOTAL: 119 mg/dL (ref 100–199)
HDL: 63 mg/dL (ref >39)
LDL CALC: 46 mg/dL (ref 0–99)
TRIGLYCERIDES: 52 mg/dL (ref 0–149)
VLDL CHOLESTEROL CAL: 10 mg/dL (ref 5–40)

## 2018-07-22 DIAGNOSIS — R69 Illness, unspecified: Secondary | ICD-10-CM | POA: Diagnosis not present

## 2018-07-23 DIAGNOSIS — I2510 Atherosclerotic heart disease of native coronary artery without angina pectoris: Secondary | ICD-10-CM | POA: Diagnosis not present

## 2018-07-23 DIAGNOSIS — I351 Nonrheumatic aortic (valve) insufficiency: Secondary | ICD-10-CM | POA: Diagnosis not present

## 2018-07-30 DIAGNOSIS — I2510 Atherosclerotic heart disease of native coronary artery without angina pectoris: Secondary | ICD-10-CM | POA: Diagnosis not present

## 2018-07-30 DIAGNOSIS — I351 Nonrheumatic aortic (valve) insufficiency: Secondary | ICD-10-CM | POA: Diagnosis not present

## 2018-07-30 DIAGNOSIS — I10 Essential (primary) hypertension: Secondary | ICD-10-CM | POA: Diagnosis not present

## 2018-07-30 DIAGNOSIS — E785 Hyperlipidemia, unspecified: Secondary | ICD-10-CM | POA: Diagnosis not present

## 2018-08-06 ENCOUNTER — Ambulatory Visit: Admit: 2018-08-06 | Payer: Medicare HMO | Admitting: Ophthalmology

## 2018-08-06 SURGERY — PHACOEMULSIFICATION, CATARACT, WITH IOL INSERTION
Anesthesia: Topical | Laterality: Left

## 2018-09-16 DIAGNOSIS — G894 Chronic pain syndrome: Secondary | ICD-10-CM | POA: Diagnosis not present

## 2018-09-16 DIAGNOSIS — Z79899 Other long term (current) drug therapy: Secondary | ICD-10-CM | POA: Diagnosis not present

## 2018-09-16 DIAGNOSIS — R69 Illness, unspecified: Secondary | ICD-10-CM | POA: Diagnosis not present

## 2018-09-16 DIAGNOSIS — M545 Low back pain: Secondary | ICD-10-CM | POA: Diagnosis not present

## 2018-09-16 DIAGNOSIS — F1120 Opioid dependence, uncomplicated: Secondary | ICD-10-CM | POA: Diagnosis not present

## 2018-10-16 IMAGING — US US RETROPERITONEAL COMPLETE
1 series · 13 of 25 positions shown · non-contrast
Comparison: 09/18/2016 and 09/24/2015 ultrasound.

CLINICAL DATA: 84-year-old male with abdominal aortic aneurysm.
Follow-up. Subsequent encounter.

EXAM:
ULTRASOUND RETROPERITONEAL COMPLETE
TECHNIQUE: Ultrasound examination of the abdominal aorta was performed to
evaluate for abdominal aortic aneurysm. The common iliac arteries,
IVC, and kidneys were also evaluated.

[Series 1: us retroperitoneal complete · 0.22mm/px · 13 of 71 slices shown]
[im 1/71]
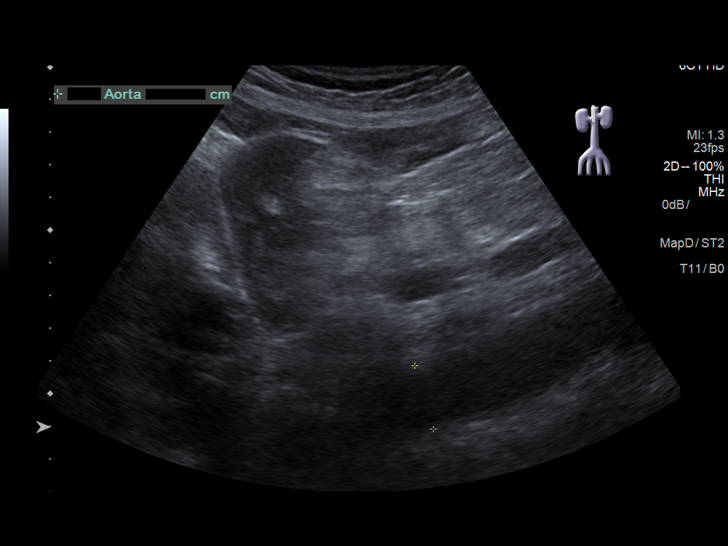
[im 6/71]
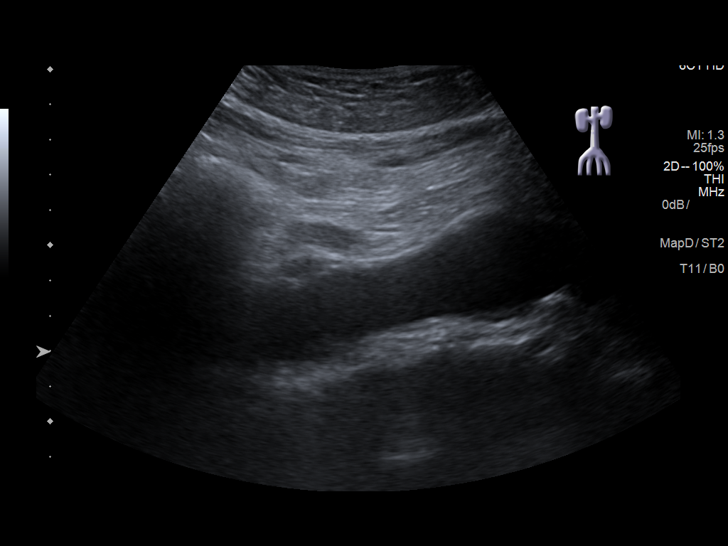
[im 12/71]
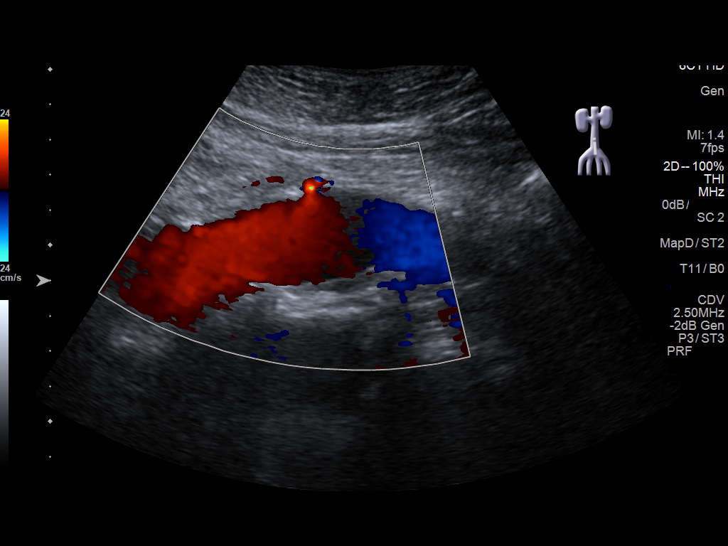
[im 18/71]
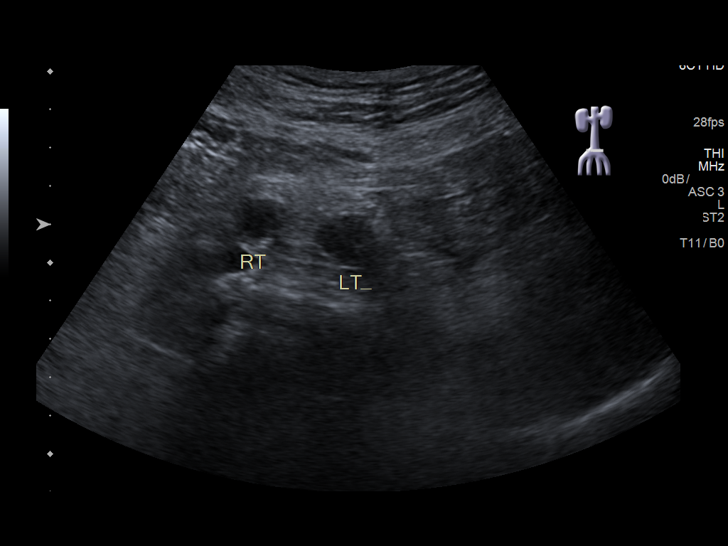
[im 24/71]
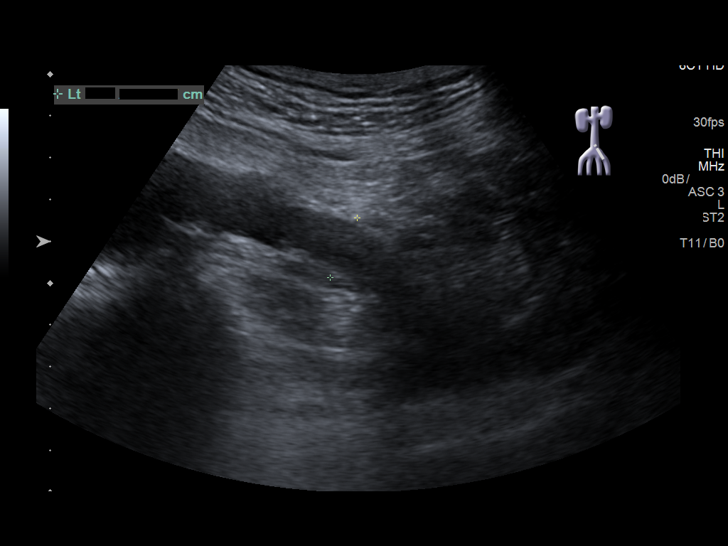
[im 30/71]
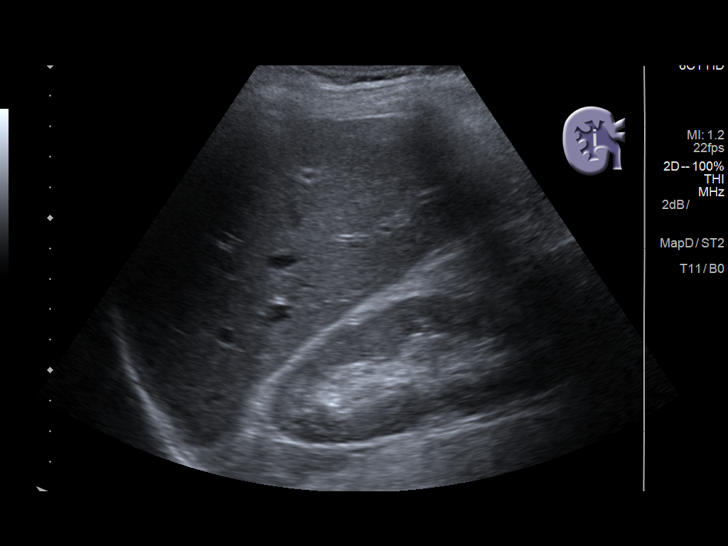
[im 36/71]
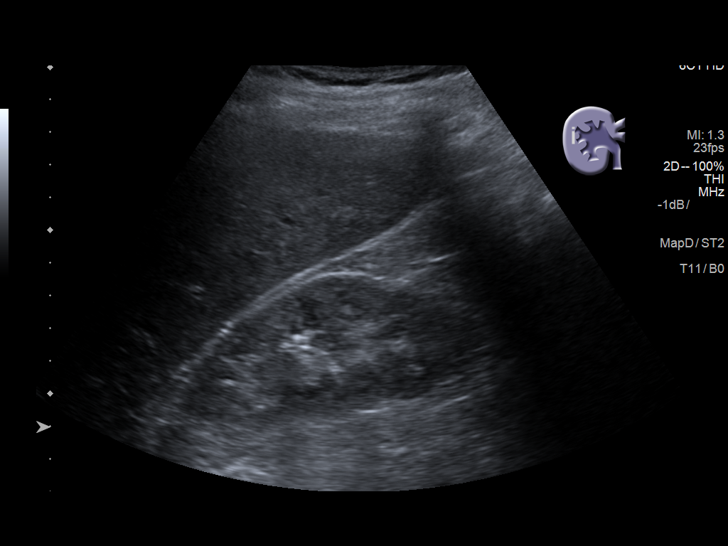
[im 41/71]
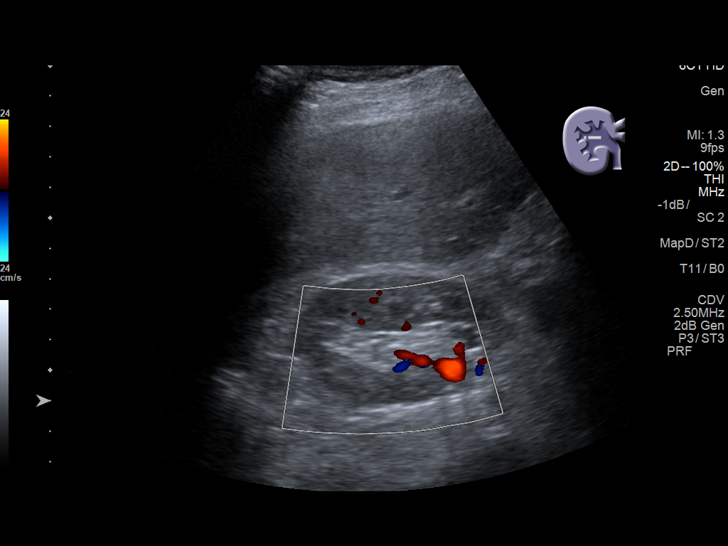
[im 47/71]
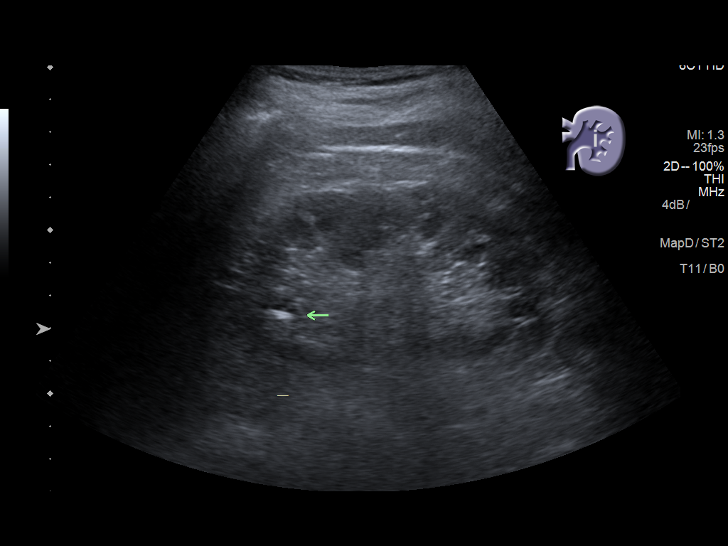
[im 53/71]
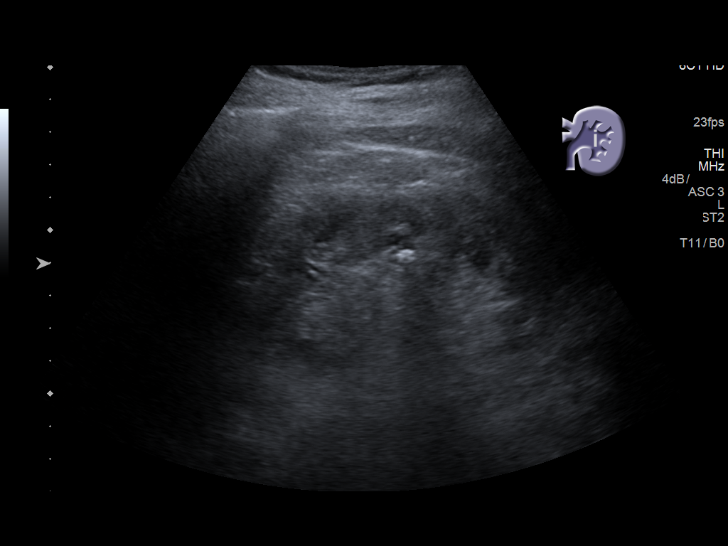
[im 59/71]
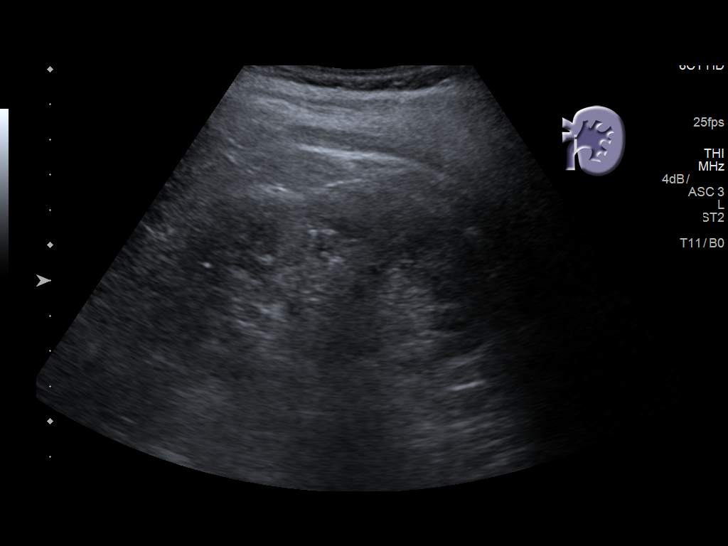
[im 65/71]
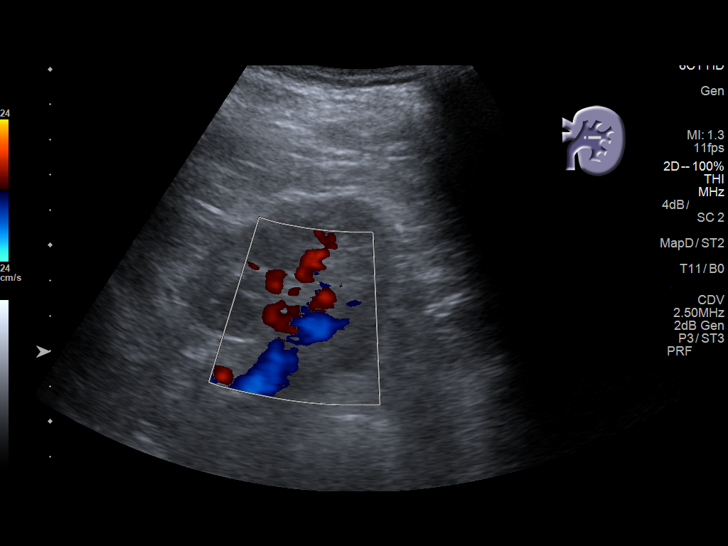
[im 71/71]
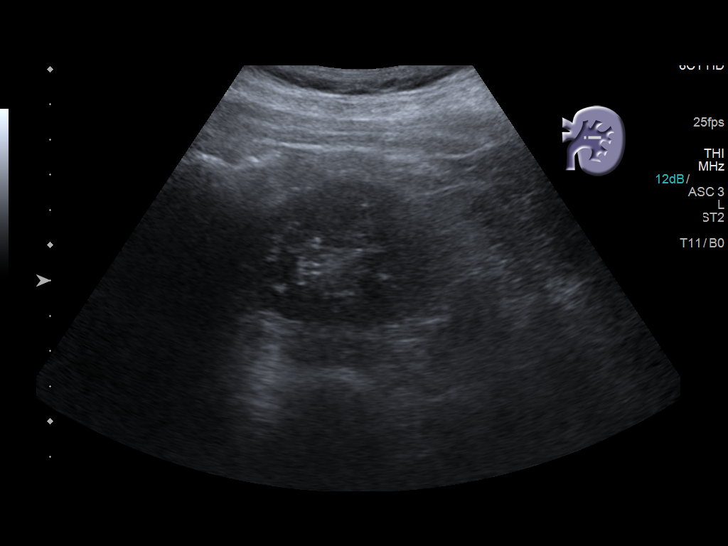

[13 of 25 positions shown; findings below may reference images not displayed]

FINDINGS: Abdominal Aorta

Atherosclerotic plaque. Lower abdominal aortic aneurysm with
transverse dimension of 3.5 x 3.5 cm. Maximal transverse dimension
on 09/18/2016 of 3.3 x 3.1 cm and 09/24/2015 of 3.6 x 3.7 cm.

Maximum AP

Diameter:

Maximum TRV

Diameter:

Right Common Iliac Artery

No aneurysm identified.

Left Common Iliac Artery

No aneurysm identified.

IVC

No abnormality visualized.

Right Kidney

Length: 10.6 cm echogenicity within normal limits. No
hydronephrosis. Upper pole 2.7 x 2.4 x 2.8 cm cyst.

Left Kidney

Length: 10.3 cm echogenicity within normal limits. No
hydronephrosis. 7 mm nonobstructing upper pole calculus.
IMPRESSION: Abdominal aortic aneurysm with maximal transverse dimension of
cm. This has not changed significantly since prior exams as noted
above. Recommend followup by ultrasound in 2 years. This
recommendation follows ACR consensus guidelines: White Paper of the
ACR Incidental Findings Committee II on Vascular Findings. [HOSPITAL] 0552; [DATE].

Aortic Atherosclerosis (324YG-H7O.O).

No hydronephrosis. Upper pole right renal cyst and nonobstructing
left upper pole 7 mm stone incidentally noted.

## 2018-11-01 ENCOUNTER — Telehealth: Payer: Self-pay | Admitting: Internal Medicine

## 2018-11-01 MED ORDER — FAMOTIDINE 20 MG PO TABS: 20.0000 mg | ORAL_TABLET | Freq: Two times a day (BID) | ORAL | 0 refills | Status: DC

## 2018-11-01 NOTE — Telephone Encounter (Signed)
I have spoken to patient's spouse to advise we will change to pepcid 20 mg twice daily (as previously okayed by Dr [name]) and patient will come for follow up next available date. She verbalizes understanding.

## 2018-12-03 DIAGNOSIS — I2510 Atherosclerotic heart disease of native coronary artery without angina pectoris: Secondary | ICD-10-CM | POA: Diagnosis not present

## 2018-12-03 DIAGNOSIS — I351 Nonrheumatic aortic (valve) insufficiency: Secondary | ICD-10-CM | POA: Diagnosis not present

## 2018-12-03 DIAGNOSIS — I10 Essential (primary) hypertension: Secondary | ICD-10-CM | POA: Diagnosis not present

## 2018-12-03 DIAGNOSIS — E785 Hyperlipidemia, unspecified: Secondary | ICD-10-CM | POA: Diagnosis not present

## 2018-12-30 ENCOUNTER — Ambulatory Visit: Payer: Medicare HMO | Admitting: Family Medicine

## 2019-01-06 ENCOUNTER — Ambulatory Visit: Payer: Medicare HMO | Admitting: Family Medicine

## 2019-01-07 ENCOUNTER — Ambulatory Visit (INDEPENDENT_AMBULATORY_CARE_PROVIDER_SITE_OTHER): Payer: Medicare HMO | Admitting: Family Medicine

## 2019-01-07 ENCOUNTER — Encounter: Payer: Self-pay | Admitting: Family Medicine

## 2019-01-07 VITALS — BP 114/76 | HR 83 | Temp 97.6°F | Ht 66.5 in | Wt 148.0 lb

## 2019-01-07 DIAGNOSIS — I2510 Atherosclerotic heart disease of native coronary artery without angina pectoris: Secondary | ICD-10-CM

## 2019-01-07 DIAGNOSIS — I5022 Chronic systolic (congestive) heart failure: Secondary | ICD-10-CM | POA: Diagnosis not present

## 2019-01-07 DIAGNOSIS — Z7902 Long term (current) use of antithrombotics/antiplatelets: Secondary | ICD-10-CM

## 2019-01-07 DIAGNOSIS — I714 Abdominal aortic aneurysm, without rupture, unspecified: Secondary | ICD-10-CM

## 2019-01-07 DIAGNOSIS — R413 Other amnesia: Secondary | ICD-10-CM

## 2019-01-07 DIAGNOSIS — I129 Hypertensive chronic kidney disease with stage 1 through stage 4 chronic kidney disease, or unspecified chronic kidney disease: Secondary | ICD-10-CM

## 2019-01-07 DIAGNOSIS — E782 Mixed hyperlipidemia: Secondary | ICD-10-CM

## 2019-01-07 DIAGNOSIS — E440 Moderate protein-calorie malnutrition: Secondary | ICD-10-CM

## 2019-01-07 MED ORDER — LISINOPRIL 5 MG PO TABS: 5.0000 mg | ORAL_TABLET | Freq: Every day | ORAL | 1 refills | Status: DC

## 2019-01-07 MED ORDER — ATORVASTATIN CALCIUM 80 MG PO TABS: 80.0000 mg | ORAL_TABLET | Freq: Every day | ORAL | 3 refills | Status: DC

## 2019-01-07 MED ORDER — FERROUS SULFATE 325 (65 FE) MG PO TABS: 325.0000 mg | ORAL_TABLET | Freq: Every day | ORAL | 1 refills | Status: DC

## 2019-01-07 MED ORDER — METOPROLOL SUCCINATE ER 25 MG PO TB24: 12.5000 mg | ORAL_TABLET | Freq: Every day | ORAL | 1 refills | Status: DC

## 2019-01-07 NOTE — Assessment & Plan Note (Signed)
Under good control on current regimen. Continue current regimen. Continue to monitor. Call with any concerns. Refills given. Labs checked today.  

## 2019-01-07 NOTE — Progress Notes (Signed)
BP 114/76   Pulse 83   Temp 97.6 F (36.4 C) (Oral)   Ht 5' 6.5" (1.689 m)   Wt 148 lb (67.1 kg)   SpO2 97%   BMI 23.53 kg/m    Subjective:    Patient ID: Brian [name], male    DOB: 24-Nov-1932, 85 y.o.   MRN: [id number]  HPI: Brian [name] is a 85 y.o. male  Chief Complaint  Patient presents with  . Follow-up  . Hypertension  . Hyperlipidemia   HYPERTENSION / HYPERLIPIDEMIA Satisfied with current treatment? yes Duration of hypertension: chronic BP monitoring frequency: not checking BP medication side effects: no Past BP meds: lisinopril, metoprolol Duration of hyperlipidemia: chronic Cholesterol medication side effects: no Cholesterol supplements: none Past cholesterol medications: atorvastatin Medication compliance: excellent compliance Aspirin: no Recent stressors: no Recurrent headaches: no Visual changes: no Palpitations: no Dyspnea: no Chest pain: no Lower extremity edema: no Dizzy/lightheaded: no  Relevant past medical, surgical, family and social history reviewed and updated as indicated. Interim medical history since our last visit reviewed. Allergies and medications reviewed and updated.  Review of Systems  Constitutional: Negative.   Respiratory: Negative.   Cardiovascular: Negative.   Musculoskeletal: Negative.   Neurological: Negative.   Psychiatric/Behavioral: Negative.     Per HPI unless specifically indicated above     Objective:    BP 114/76   Pulse 83   Temp 97.6 F (36.4 C) (Oral)   Ht 5' 6.5" (1.689 m)   Wt 148 lb (67.1 kg)   SpO2 97%   BMI 23.53 kg/m   Wt Readings from Last 3 Encounters:  01/07/19 148 lb (67.1 kg)  06/26/18 147 lb 7 oz (66.9 kg)  06/19/18 149 lb 8 oz (67.8 kg)    Physical Exam Vitals signs and nursing note reviewed.  Constitutional:      General: He is not in acute distress.    Appearance: Normal appearance. He is not ill-appearing, toxic-appearing or diaphoretic.  HENT:     Head: Normocephalic  and atraumatic.     Right Ear: External ear normal.     Left Ear: External ear normal.     Nose: Nose normal.     Mouth/Throat:     Mouth: Mucous membranes are moist.     Pharynx: Oropharynx is clear.  Eyes:     General: No scleral icterus.       Right eye: No discharge.        Left eye: No discharge.     Extraocular Movements: Extraocular movements intact.     Conjunctiva/sclera: Conjunctivae normal.     Pupils: Pupils are equal, round, and reactive to light.  Neck:     Musculoskeletal: Normal range of motion and neck supple.  Cardiovascular:     Rate and Rhythm: Normal rate and regular rhythm.     Pulses: Normal pulses.     Heart sounds: Normal heart sounds. No murmur. No friction rub. No gallop.   Pulmonary:     Effort: Pulmonary effort is normal. No respiratory distress.     Breath sounds: Normal breath sounds. No stridor. No wheezing, rhonchi or rales.  Chest:     Chest wall: No tenderness.  Musculoskeletal: Normal range of motion.  Skin:    General: Skin is warm and dry.     Capillary Refill: Capillary refill takes less than 2 seconds.     Coloration: Skin is not jaundiced or pale.     Findings: No bruising, erythema, lesion or  rash.  Neurological:     General: No focal deficit present.     Mental Status: He is alert and oriented to person, place, and time. Mental status is at baseline.  Psychiatric:        Mood and Affect: Mood normal.        Behavior: Behavior normal.        Thought Content: Thought content normal.        Judgment: Judgment normal.     Results for orders placed or performed in visit on 06/26/18  Comprehensive metabolic panel  Result Value Ref Range   Glucose 109 (H) 65 - 99 mg/dL   BUN 11 8 - 27 mg/dL   Creatinine, Ser 0.88 0.76 - 1.27 mg/dL   GFR calc non Af Amer 79 >59 mL/min/1.73   GFR calc Af Amer 92 >59 mL/min/1.73   BUN/Creatinine Ratio 13 10 - 24   Sodium 141 134 - 144 mmol/L   Potassium 4.2 3.5 - 5.2 mmol/L   Chloride 105 96 - 106  mmol/L   CO2 23 20 - 29 mmol/L   Calcium 9.2 8.6 - 10.2 mg/dL   Total Protein 6.3 6.0 - 8.5 g/dL   Albumin 4.1 3.5 - 4.7 g/dL   Globulin, Total 2.2 1.5 - 4.5 g/dL   Albumin/Globulin Ratio 1.9 1.2 - 2.2   Bilirubin Total 0.4 0.0 - 1.2 mg/dL   Alkaline Phosphatase 69 39 - 117 IU/L   AST 16 0 - 40 IU/L   ALT 15 0 - 44 IU/L  Lipid Panel w/o Chol/HDL Ratio  Result Value Ref Range   Cholesterol, Total 119 100 - 199 mg/dL   Triglycerides 52 0 - 149 mg/dL   HDL 63 >39 mg/dL   VLDL Cholesterol Cal 10 5 - 40 mg/dL   LDL Calculated 46 0 - 99 mg/dL  CBC With Differential/Platelet  Result Value Ref Range   WBC 6.5 3.4 - 10.8 x10E3/uL   RBC 4.20 4.14 - 5.80 x10E6/uL   Hemoglobin 14.6 13.0 - 17.7 g/dL   Hematocrit 41.0 37.5 - 51.0 %   MCV 98 (H) 79 - 97 fL   MCH 34.8 (H) 26.6 - 33.0 pg   MCHC 35.6 31.5 - 35.7 g/dL   RDW 13.3 12.3 - 15.4 %   Platelets 129 (L) 150 - 450 x10E3/uL   Neutrophils 55 Not Estab. %   Lymphs 32 Not Estab. %   MID 14 Not Estab. %   Neutrophils Absolute 3.6 1.4 - 7.0 x10E3/uL   Lymphocytes Absolute 2.0 0.7 - 3.1 x10E3/uL   MID (Absolute) 0.9 0.1 - 1.6 X10E3/uL      Assessment & Plan:   Problem List Items Addressed This Visit      Cardiovascular and Mediastinum   AAA (abdominal aortic aneurysm) ([place])    Due for repeat US in 1 year. Stable. Continue to monitor.       Relevant Medications   lisinopril (PRINIVIL,ZESTRIL) 5 MG tablet   atorvastatin (LIPITOR) 80 MG tablet   metoprolol succinate (TOPROL-XL) 25 MG 24 hr tablet   CAD (coronary artery disease)    Stable. Continue to follow with cardiology. Call with any concerns. Will keep BP and cholesterol under good control.      Relevant Medications   lisinopril (PRINIVIL,ZESTRIL) 5 MG tablet   atorvastatin (LIPITOR) 80 MG tablet   metoprolol succinate (TOPROL-XL) 25 MG 24 hr tablet   HFrEF (heart failure with reduced ejection fraction) (HCC)    Stable. Continue to  follow with cardiology. Call with any  concerns. Will keep BP and cholesterol under good control.      Relevant Medications   lisinopril (PRINIVIL,ZESTRIL) 5 MG tablet   atorvastatin (LIPITOR) 80 MG tablet   metoprolol succinate (TOPROL-XL) 25 MG 24 hr tablet     Genitourinary   Benign hypertensive renal disease - Primary    Under good control on current regimen. Continue current regimen. Continue to monitor. Call with any concerns. Refills given. Labs checked today.       Relevant Orders   Comprehensive metabolic panel     Other   Hyperlipidemia    Under good control on current regimen. Continue current regimen. Continue to monitor. Call with any concerns. Refills given. Labs checked today.      Relevant Medications   lisinopril (PRINIVIL,ZESTRIL) 5 MG tablet   atorvastatin (LIPITOR) 80 MG tablet   metoprolol succinate (TOPROL-XL) 25 MG 24 hr tablet   Other Relevant Orders   Comprehensive metabolic panel   Lipid Panel w/o Chol/HDL Ratio   Encounter for long-term (current) use of antiplatelets/antithrombotics    Checking CBC today. Continue to monitor. Call with any concerns. Continue to monitor.       Relevant Orders   CBC with Differential/Platelet   Malnutrition of moderate degree (HCC)    Weight stable. Continue to monitor. Call with any concerns.       Other Visit Diagnoses    Memory loss       Will check labs today and fully evalate next visit- doesn't want to start any medicine at this time.    Relevant Orders   TSH       Follow up plan: Return in about 6 months (around 07/08/2019) for Wellness/physical on same day.

## 2019-01-07 NOTE — Assessment & Plan Note (Signed)
Stable. Continue to follow with cardiology. Call with any concerns. Will keep BP and cholesterol under good control.

## 2019-01-07 NOTE — Assessment & Plan Note (Signed)
Weight stable. Continue to monitor. Call with any concerns.  

## 2019-01-07 NOTE — Assessment & Plan Note (Signed)
Due for repeat US in 1 year. Stable. Continue to monitor.

## 2019-01-07 NOTE — Assessment & Plan Note (Signed)
Checking CBC today. Continue to monitor. Call with any concerns. Continue to monitor.

## 2019-01-08 LAB — COMPREHENSIVE METABOLIC PANEL
ALK PHOS: 78 IU/L (ref 39–117)
ALT: 18 IU/L (ref 0–44)
AST: 20 IU/L (ref 0–40)
Albumin/Globulin Ratio: 2.1 (ref 1.2–2.2)
Albumin: 4.6 g/dL (ref 3.6–4.6)
BUN/Creatinine Ratio: 12 (ref 10–24)
BUN: 10 mg/dL (ref 8–27)
Bilirubin Total: 0.4 mg/dL (ref 0.0–1.2)
CO2: 22 mmol/L (ref 20–29)
CREATININE: 0.84 mg/dL (ref 0.76–1.27)
Calcium: 9.5 mg/dL (ref 8.6–10.2)
Chloride: 106 mmol/L (ref 96–106)
GFR calc Af Amer: 94 mL/min/{1.73_m2} (ref >59)
GFR calc non Af Amer: 81 mL/min/{1.73_m2} (ref >59)
Globulin, Total: 2.2 g/dL (ref 1.5–4.5)
Glucose: 108 mg/dL — ABNORMAL HIGH (ref 65–99)
Potassium: 4.8 mmol/L (ref 3.5–5.2)
SODIUM: 143 mmol/L (ref 134–144)
Total Protein: 6.8 g/dL (ref 6.0–8.5)

## 2019-01-08 LAB — CBC WITH DIFFERENTIAL/PLATELET
BASOS: 1 %
Basophils Absolute: 0 10*3/uL (ref 0.0–0.2)
EOS (ABSOLUTE): 0.2 10*3/uL (ref 0.0–0.4)
Eos: 3 %
Hematocrit: 44.3 % (ref 37.5–51.0)
Hemoglobin: 15.4 g/dL (ref 13.0–17.7)
Immature Grans (Abs): 0 10*3/uL (ref 0.0–0.1)
Immature Granulocytes: 0 %
Lymphocytes Absolute: 1.5 10*3/uL (ref 0.7–3.1)
Lymphs: 26 %
MCH: 32.8 pg (ref 26.6–33.0)
MCHC: 34.8 g/dL (ref 31.5–35.7)
MCV: 94 fL (ref 79–97)
MONOS ABS: 0.4 10*3/uL (ref 0.1–0.9)
Monocytes: 8 %
NEUTROS ABS: 3.5 10*3/uL (ref 1.4–7.0)
Neutrophils: 62 %
PLATELETS: 157 10*3/uL (ref 150–450)
RBC: 4.7 x10E6/uL (ref 4.14–5.80)
RDW: 12.1 % (ref 11.6–15.4)
WBC: 5.7 10*3/uL (ref 3.4–10.8)

## 2019-01-08 LAB — LIPID PANEL W/O CHOL/HDL RATIO
CHOLESTEROL TOTAL: 134 mg/dL (ref 100–199)
HDL: 62 mg/dL (ref >39)
LDL CALC: 58 mg/dL (ref 0–99)
TRIGLYCERIDES: 68 mg/dL (ref 0–149)
VLDL Cholesterol Cal: 14 mg/dL (ref 5–40)

## 2019-01-08 LAB — TSH: TSH: 3.74 u[IU]/mL (ref 0.450–4.500)

## 2019-01-09 ENCOUNTER — Encounter: Payer: Self-pay | Admitting: Family Medicine

## 2019-01-13 DIAGNOSIS — Z79899 Other long term (current) drug therapy: Secondary | ICD-10-CM | POA: Diagnosis not present

## 2019-01-13 DIAGNOSIS — R69 Illness, unspecified: Secondary | ICD-10-CM | POA: Diagnosis not present

## 2019-01-13 DIAGNOSIS — F1120 Opioid dependence, uncomplicated: Secondary | ICD-10-CM | POA: Diagnosis not present

## 2019-01-13 DIAGNOSIS — M545 Low back pain: Secondary | ICD-10-CM | POA: Diagnosis not present

## 2019-01-13 DIAGNOSIS — G894 Chronic pain syndrome: Secondary | ICD-10-CM | POA: Diagnosis not present

## 2019-02-02 ENCOUNTER — Other Ambulatory Visit: Payer: Self-pay | Admitting: Internal Medicine

## 2019-03-14 NOTE — Progress Notes (Signed)
Subjective:  Primary Physician:  Valerie Roys, DO  Patient ID: Brian [name], male    DOB: [birth year], 85 y.o.   MRN: [id number] This visit type was conducted due to national recommendations for restrictions regarding the COVID-19 Pandemic (e.g. social distancing).  This format is felt to be most appropriate for this patient at this time.  All issues noted in this document were discussed and addressed.  No physical exam was performed (except for noted visual exam findings with Telehealth visits - very limited).  The patient has consented to conduct a Telehealth visit and understands insurance will be billed.   I connected with patient, on 03/17/19  by a  telemedicine application and verified that I am speaking with the correct person using two identifiers.     I discussed the limitations of evaluation and management by telemedicine and the availability of in person appointments. The patient expressed understanding and agreed to proceed.   I have discussed with patient regarding the safety during COVID Pandemic and steps and precautions including social distancing with the patient.     Chief Complaint  Patient presents with  . Coronary Artery Disease    56mo  . Follow-up    HPI: Brian [name]  is a 85 y.o. male.  He had Acute AWMI on 05/29/2014. Cath.-05/29/14- Lt. main- Normal. LAD- severe diffuse disease, severe aneurysm formation in prox, mid segments. Thrombus in ostium & prox. segment. 80% ostial LAD stenosis, followed by aneurysm. LAD was subtotally occluded in mid segment after the aneurysm. Patient had PTCA and stenting of ostium of the LAD-3 x 12 mm Promus DES. Procedure was successful but suboptimal due to vessel anatomy and aneurysm formation. He also had stenting of mid LAD-2.528 mm Promus DES. It was technically very difficult procedure due to thrombus formation and aneurysmal sac. Circumflex had 30-40% proximal and midsegment lesions. RCA did not have any high-grade  lesions. There was 20-30% stenosis of mid RCA and 10-15% stenosis of PL. mild diffuse disease of RCA with mild ectasia in mid to distal segment. Left ventriculogram-EF -30%. Mid-distal anterior, anteroapical and inferoapical akinesis.  Patient's EF has improved to 55 percent by echocardiogram on 07/23/2018.  Patient is feeling fine. He denies any chest pain, tightness or pressure. He has been walking at fast pace for 15-20 minutes a day, 4-5 days a week. No shortness of breath. No orthopnea or PND. No palpitation, sudden heart racing or irregular heartbeat. He had occasional dizziness, no near syncope or syncope. No history of bleeding from any site and no excessive bruising. No history of swelling on the legs and no claudication. No complains of diffuse muscle aching, soreness or muscle weakness.  Patient has hypertension and hyperlipidemia. Patient has mild hyperglycemia, possible prediabetes. He had smoked for many years but has quit smoking since his MI in July 2015.  No history of thyroid problems. No history of TIA or CVA.  Past Medical History:  Diagnosis Date  . AAA (abdominal aortic aneurysm) (HCC)    2.9 CM infrarenal AAA on CT 11/2011  . Acute anterior wall MI ([place]) 05/29/2014  . Alcoholism ([place])   . Aneurysm of abdominal aorta (HCC)    SMALL  . Anxiety    panic attacks, rare.  . Aortic aneurysm (HCC)    small  . Arthritis   . Coronary artery disease    2 stents in july 2015  . Depression with anxiety   . Diverticulosis of colon 10/2009  . Duodenal  stenosis 03/2012   non obstructing.   . Gastritis 03/2012  . GERD (gastroesophageal reflux disease)   . GI bleed   . HFrEF (heart failure with reduced ejection fraction) ([place])   . HOH (hard of hearing)   . Hyperlipemia   . Hypertension   . Hypovolemic shock ([place])   . Kidney stones   . Myocardial infarction ([place]) 2015  . Panic attacks   . Positive H. pylori titer ?, before 2010   treated with Abx.   . Tubular adenoma  of colon     Past Surgical History:  Procedure Laterality Date  . CARDIAC CATHETERIZATION  2015   Stents  . CATARACT EXTRACTION W/PHACO Right 09/21/2016   Procedure: CATARACT EXTRACTION PHACO AND INTRAOCULAR LENS PLACEMENT (IOC);  Surgeon: Eulogio Bear, MD;  Location: [facility] ORS;  Service: Ophthalmology;  Laterality: Right;  [place] 1.10 AP% 13.8 CDE 9.93 Fluid pack lot # [id number]  . CATARACT EXTRACTION W/PHACO Left 12/07/2016   Procedure: CATARACT EXTRACTION PHACO AND INTRAOCULAR LENS PLACEMENT (IOC);  Surgeon: Eulogio Bear, MD;  Location: [facility] ORS;  Service: Ophthalmology;  Laterality: Left;  [place] 00:42.7 ap 13.7 cde 5.85 lot #[id number]  . COLONOSCOPY WITH PROPOFOL Left 06/06/2015   Procedure: COLONOSCOPY WITH PROPOFOL;  Surgeon: Hulen Luster, MD;  Location: [facility] ENDOSCOPY;  Service: Endoscopy;  Laterality: Left;  . CORONARY ANGIOPLASTY     2015  . LEFT HEART CATHETERIZATION WITH CORONARY ANGIOGRAM Bilateral 05/29/2014   Procedure: LEFT HEART CATHETERIZATION WITH CORONARY ANGIOGRAM;  Surgeon: Laverda Page, MD;  Location: [facility] CATH LAB;  Service: Cardiovascular;  Laterality: Bilateral;  . PERCUTANEOUS CORONARY STENT INTERVENTION (PCI-S)  05/29/2014   Procedure: PERCUTANEOUS CORONARY STENT INTERVENTION (PCI-S);  Surgeon: Laverda Page, MD;  Location: [facility] CATH LAB;  Service: Cardiovascular;;  DES x2 Prox and Mid LAD     Social History   Socioeconomic History  . Marital status: Married    Spouse name: Not on file  . Number of children: 4  . Years of education: Not on file  . Highest education level: High school graduate  Occupational History  . Not on file  Social Needs  . Financial resource strain: Not hard at all  . Food insecurity:    Worry: Never true    Inability: Never true  . Transportation needs:    Medical: No    Non-medical: No  Tobacco Use  . Smoking status: Former Smoker    Packs/day: 0.25    Years: 55.00    Pack years: 13.75    Types: Cigarettes    Last  attempt to quit: 05/29/2014    Years since quitting: 4.8  . Smokeless tobacco: Never Used  Substance and Sexual Activity  . Alcohol use: No    Alcohol/week: 0.0 standard drinks  . Drug use: No  . Sexual activity: Never  Lifestyle  . Physical activity:    Days per week: 4 days    Minutes per session: 10 min  . Stress: Not at all  Relationships  . Social connections:    Talks on phone: Never    Gets together: More than three times a week    Attends religious service: More than 4 times per year    Active member of club or organization: Yes    Attends meetings of clubs or organizations: More than 4 times per year    Relationship status: Married  . Intimate partner violence:    Fear of current or ex partner: No  Emotionally abused: No    Physically abused: No    Forced sexual activity: No  Other Topics Concern  . Not on file  Social History Narrative   Working fulltime     Current Outpatient Medications on File Prior to Visit  Medication Sig Dispense Refill  . atorvastatin (LIPITOR) 80 MG tablet Take 1 tablet (80 mg total) by mouth daily at 6 PM. 90 tablet 3  . clopidogrel (PLAVIX) 75 MG tablet Take 75 mg by mouth daily.     . diazepam (VALIUM) 5 MG tablet Take 5 mg by mouth every 8 (eight) hours as needed (for anxiety).     . famotidine (PEPCID) 20 MG tablet Take 1 tablet by mouth twice daily 180 tablet 0  . ferrous sulfate (IRON SUPPLEMENT) 325 (65 FE) MG tablet Take 1 tablet (325 mg total) by mouth daily with breakfast. 3 times a week 90 tablet 1  . lisinopril (PRINIVIL,ZESTRIL) 5 MG tablet Take 1 tablet (5 mg total) by mouth daily. 90 tablet 1  . metoprolol succinate (TOPROL-XL) 25 MG 24 hr tablet Take 0.5 tablets (12.5 mg total) by mouth daily. 45 tablet 1  . nitroGLYCERIN (NITROSTAT) 0.4 MG SL tablet Place 0.4 mg under the tongue every 5 (five) minutes x 3 doses as needed for chest pain.    Marland Kitchen ondansetron (ZOFRAN-ODT) 4 MG disintegrating tablet DISSOLVE 1 TABLET IN MOUTH  EVERY 6 HOURS AS NEEDED FOR NAUSEA AND VOMITING (NAUSEA/UPPER  ABDOMINAL  PAIN) 30 tablet 0  . traMADol (ULTRAM) 50 MG tablet Take 1 tablet (50 mg total) by mouth every 4 (four) hours as needed. (Patient taking differently: Take 50 mg by mouth every 4 (four) hours as needed (for pain). ) 60 tablet 0   No current facility-administered medications on file prior to visit.     Review of Systems  Constitutional: Negative for fever.  HENT: Negative for nosebleeds.   Eyes: Negative for blurred vision.  Respiratory: Negative for cough.   Gastrointestinal: Negative for abdominal pain, nausea and vomiting.  Genitourinary: Negative for dysuria.  Musculoskeletal: Negative for myalgias.  Skin: Negative for itching and rash.  Neurological: Positive for dizziness (occasional). Negative for seizures and loss of consciousness.  Psychiatric/Behavioral: The patient is not nervous/anxious.        Objective:  Blood pressure 119/68, pulse (!) 59. There is no height or weight on file to calculate BMI.  Physical Exam  Patient is alert and oriented.  He appeared very comfortable talking to me during the visit.  No further physical examination was possible as it was a telemedicine visit.  CARDIAC STUDIES:  Cath.-05/29/14- Lt. main- Normal. LAD- severe diffuse disease, severe aneurysm formation in prox, mid segments. Thrombus in ostium & prox. segment. 80% ostial LAD stenosis, followed by aneurysm. LAD was subtotally occluded in mid segment after the aneurysm. Ostial LAD-3 x 12 mm Promus DES. Mid LAD-2.528 mm Promus DES. It was technically very difficult procedure due to thrombus formation and aneurysmal sac. Cx moderate disease. Mild disease RCA. LVEF -30%. Mid-distal, anterior, anteroapical and inferoapical akinesis. Echo- 07/23/2018: 1. Left ventricle cavity is normal in size. Mild concentric hypertrophy of the left ventricle. Normal global wall motion. Visual EF is approx. 55%. Doppler evidence of grade I  (impaired) diastolic dysfunction. 2. Mild (Grade I) aortic regurgitation. 3. Trace mitral regurgitation. 4. Mild tricuspid regurgitation. No evidence of pulmonary hypertension. 5. cf. echo. of 09/28/2016, LV syst. function is normal now.  Assessment & Recommendations:   1. Atherosclerosis of native  coronary artery of native heart without angina pectoris  2. Mild aortic regurgitation  3. Hypertension with heart disease  4. Hypercholesterolemia   Laboratory Exam:  CBC Latest Ref Rng & Units 01/07/2019 06/26/2018 06/03/2018  WBC 3.4 - 10.8 x10E3/uL 5.7 6.5 6.8  Hemoglobin 13.0 - 17.7 g/dL 15.4 14.6 14.1  Hematocrit 37.5 - 51.0 % 44.3 41.0 41.8  Platelets 150 - 450 x10E3/uL 157 129(L) 143.0(L)   CMP Latest Ref Rng & Units 01/07/2019 06/26/2018 12/25/2017  Glucose 65 - 99 mg/dL 108(H) 109(H) 93  BUN 8 - 27 mg/dL 10 11 8   Creatinine 0.76 - 1.27 mg/dL 0.84 0.88 0.82  Sodium 134 - 144 mmol/L 143 141 146(H)  Potassium 3.5 - 5.2 mmol/L 4.8 4.2 4.2  Chloride 96 - 106 mmol/L 106 105 108(H)  CO2 20 - 29 mmol/L 22 23 24   Calcium 8.6 - 10.2 mg/dL 9.5 9.2 9.5  Total Protein 6.0 - 8.5 g/dL 6.8 6.3 7.0  Total Bilirubin 0.0 - 1.2 mg/dL 0.4 0.4 0.3  Alkaline Phos 39 - 117 IU/L 78 69 84  AST 0 - 40 IU/L 20 16 17   ALT 0 - 44 IU/L 18 15 14    Lipid Panel     Component Value Date/Time   CHOL 134 01/07/2019 1148   CHOL 189 04/18/2012 0707   TRIG 68 01/07/2019 1148   TRIG 149 04/18/2012 0707   HDL 62 01/07/2019 1148   HDL 67 (H) 04/18/2012 0707   CHOLHDL 2.9 05/31/2014 1135   VLDL 27 05/31/2014 1135   VLDL 30 04/18/2012 0707   LDLCALC 58 01/07/2019 1148   LDLCALC 92 04/18/2012 0707     Recommendation:  Cardiac status is stable. Patient does not have any angina or CHF. No bleeding from any site.  Blood pressure and cholesterol are well controlled with therapy. He will continue all the present medications including clopidogrel.   Secondary prevention was again discussed with the  patient. He was advised to continue low-salt, low-cholesterol diet and encouraged to continue regular walking as tolerated. Return for follow-up after 4 months but call us earlier if there are any cardiac problems.   Despina Hick, MD, [facility] 03/17/2019, 10:52 AM [facility] [place] Pager: [phone] Office: [phone] If no answer Cell [phone]

## 2019-03-17 ENCOUNTER — Ambulatory Visit (INDEPENDENT_AMBULATORY_CARE_PROVIDER_SITE_OTHER): Payer: Medicare HMO | Admitting: Cardiology

## 2019-03-17 ENCOUNTER — Other Ambulatory Visit: Payer: Self-pay

## 2019-03-17 ENCOUNTER — Encounter: Payer: Self-pay | Admitting: Cardiology

## 2019-03-17 VITALS — BP 119/68 | HR 59

## 2019-03-17 DIAGNOSIS — E7800 Pure hypercholesterolemia, unspecified: Secondary | ICD-10-CM

## 2019-03-17 DIAGNOSIS — I351 Nonrheumatic aortic (valve) insufficiency: Secondary | ICD-10-CM | POA: Diagnosis not present

## 2019-03-17 DIAGNOSIS — I2510 Atherosclerotic heart disease of native coronary artery without angina pectoris: Secondary | ICD-10-CM | POA: Diagnosis not present

## 2019-03-17 DIAGNOSIS — I119 Hypertensive heart disease without heart failure: Secondary | ICD-10-CM

## 2019-03-20 DIAGNOSIS — E785 Hyperlipidemia, unspecified: Secondary | ICD-10-CM | POA: Diagnosis not present

## 2019-03-20 DIAGNOSIS — Z720 Tobacco use: Secondary | ICD-10-CM | POA: Diagnosis not present

## 2019-03-20 DIAGNOSIS — N529 Male erectile dysfunction, unspecified: Secondary | ICD-10-CM | POA: Diagnosis not present

## 2019-03-20 DIAGNOSIS — I10 Essential (primary) hypertension: Secondary | ICD-10-CM | POA: Diagnosis not present

## 2019-03-20 DIAGNOSIS — Z7902 Long term (current) use of antithrombotics/antiplatelets: Secondary | ICD-10-CM | POA: Diagnosis not present

## 2019-03-20 DIAGNOSIS — I252 Old myocardial infarction: Secondary | ICD-10-CM | POA: Diagnosis not present

## 2019-03-20 DIAGNOSIS — F419 Anxiety disorder, unspecified: Secondary | ICD-10-CM | POA: Diagnosis not present

## 2019-03-20 DIAGNOSIS — G8929 Other chronic pain: Secondary | ICD-10-CM | POA: Diagnosis not present

## 2019-03-20 DIAGNOSIS — R69 Illness, unspecified: Secondary | ICD-10-CM | POA: Diagnosis not present

## 2019-03-20 DIAGNOSIS — K219 Gastro-esophageal reflux disease without esophagitis: Secondary | ICD-10-CM | POA: Diagnosis not present

## 2019-05-11 ENCOUNTER — Other Ambulatory Visit: Payer: Self-pay | Admitting: Cardiology

## 2019-05-12 NOTE — Telephone Encounter (Signed)
Please fill

## 2019-05-14 DIAGNOSIS — G894 Chronic pain syndrome: Secondary | ICD-10-CM | POA: Diagnosis not present

## 2019-05-14 DIAGNOSIS — R69 Illness, unspecified: Secondary | ICD-10-CM | POA: Diagnosis not present

## 2019-05-14 DIAGNOSIS — Z79899 Other long term (current) drug therapy: Secondary | ICD-10-CM | POA: Diagnosis not present

## 2019-05-14 DIAGNOSIS — M545 Low back pain: Secondary | ICD-10-CM | POA: Diagnosis not present

## 2019-05-14 DIAGNOSIS — F1120 Opioid dependence, uncomplicated: Secondary | ICD-10-CM | POA: Diagnosis not present

## 2019-05-19 ENCOUNTER — Telehealth: Payer: Self-pay | Admitting: Internal Medicine

## 2019-05-19 MED ORDER — FAMOTIDINE 20 MG PO TABS: 20.0000 mg | ORAL_TABLET | Freq: Two times a day (BID) | ORAL | 0 refills | Status: DC

## 2019-05-19 NOTE — Telephone Encounter (Signed)
Hi Dottie, I just scheduled pt for a telephone visit on 8/17 with Dr. [name]. Pt is out of the medication and is requesting a rf in the meantime.

## 2019-05-19 NOTE — Telephone Encounter (Signed)
Rx sent to pharmacy until appointment.

## 2019-05-19 NOTE — Telephone Encounter (Signed)
Pt needs rf for famotidine sent to [facility] in [place].

## 2019-05-19 NOTE — Telephone Encounter (Signed)
Patient first needs office visit/telephone visit for further refills.

## 2019-06-16 ENCOUNTER — Telehealth: Payer: Self-pay | Admitting: Family Medicine

## 2019-06-16 NOTE — Telephone Encounter (Signed)
Called pt to reschedule physical due to provider being on vacation no answer no vm

## 2019-06-23 ENCOUNTER — Encounter: Payer: Medicare HMO | Admitting: Family Medicine

## 2019-06-23 ENCOUNTER — Other Ambulatory Visit: Payer: Self-pay

## 2019-06-23 ENCOUNTER — Ambulatory Visit (INDEPENDENT_AMBULATORY_CARE_PROVIDER_SITE_OTHER): Payer: Medicare HMO

## 2019-06-23 DIAGNOSIS — Z0000 Encounter for general adult medical examination without abnormal findings: Secondary | ICD-10-CM

## 2019-06-23 NOTE — Patient Instructions (Addendum)
Brian [name] , Thank you for taking time to come for your Medicare Wellness Visit. I appreciate your ongoing commitment to your health goals. Please review the following plan we discussed and let me know if I can assist you in the future.   Screening recommendations/referrals: Colonoscopy: no longer required  Recommended yearly ophthalmology/optometry visit for glaucoma screening and checkup Recommended yearly dental visit for hygiene and checkup  Vaccinations: Influenza vaccine: due 07/2019 Pneumococcal vaccine: completed series  Tdap vaccine: up to date Shingles vaccine: shingrix eligible, check with your insurance company for coverage     Advanced directives: Please bring a copy of your health care power of attorney and living will to the office at your convenience.  Conditions/risks identified: fall prevention discussed  Next appointment: Reschedule your physical with Dr.Johnson. Follow up in one year for your annual medicare visit.   Preventive Care 83 Years and Older, Male Preventive care refers to lifestyle choices and visits with your health care provider that can promote health and wellness. What does preventive care include?  A yearly physical exam. This is also called an annual well check.  Dental exams once or twice a year.  Routine eye exams. Ask your health care provider how often you should have your eyes checked.  Personal lifestyle choices, including:  Daily care of your teeth and gums.  Regular physical activity.  Eating a healthy diet.  Avoiding tobacco and drug use.  Limiting alcohol use.  Practicing safe sex.  Taking low doses of aspirin every day.  Taking vitamin and mineral supplements as recommended by your health care provider. What happens during an annual well check? The services and screenings done by your health care provider during your annual well check will depend on your age, overall health, lifestyle risk factors, and family history of  disease. Counseling  Your health care provider may ask you questions about your:  Alcohol use.  Tobacco use.  Drug use.  Emotional well-being.  Home and relationship well-being.  Sexual activity.  Eating habits.  History of falls.  Memory and ability to understand (cognition).  Work and work Statistician. Screening  You may have the following tests or measurements:  Height, weight, and BMI.  Blood pressure.  Lipid and cholesterol levels. These may be checked every 5 years, or more frequently if you are over 67 years old.  Skin check.  Lung cancer screening. You may have this screening every year starting at age 77 if you have a 30-pack-year history of smoking and currently smoke or have quit within the past 15 years.  Fecal occult blood test (FOBT) of the stool. You may have this test every year starting at age 48.  Flexible sigmoidoscopy or colonoscopy. You may have a sigmoidoscopy every 5 years or a colonoscopy every 10 years starting at age 70.  Prostate cancer screening. Recommendations will vary depending on your family history and other risks.  Hepatitis C blood test.  Hepatitis B blood test.  Sexually transmitted disease (STD) testing.  Diabetes screening. This is done by checking your blood sugar (glucose) after you have not eaten for a while (fasting). You may have this done every 1-3 years.  Abdominal aortic aneurysm (AAA) screening. You may need this if you are a current or former smoker.  Osteoporosis. You may be screened starting at age 62 if you are at high risk. Talk with your health care provider about your test results, treatment options, and if necessary, the need for more tests. Vaccines  Your  health care provider may recommend certain vaccines, such as:  Influenza vaccine. This is recommended every year.  Tetanus, diphtheria, and acellular pertussis (Tdap, Td) vaccine. You may need a Td booster every 10 years.  Zoster vaccine. You may  need this after age 61.  Pneumococcal 13-valent conjugate (PCV13) vaccine. One dose is recommended after age 47.  Pneumococcal polysaccharide (PPSV23) vaccine. One dose is recommended after age 73. Talk to your health care provider about which screenings and vaccines you need and how often you need them. This information is not intended to replace advice given to you by your health care provider. Make sure you discuss any questions you have with your health care provider. Document Released: 11/26/2015 Document Revised: 07/19/2016 Document Reviewed: 08/31/2015 Elsevier Interactive Patient Education  2017 [place] Prevention in the Home Falls can cause injuries. They can happen to people of all ages. There are many things you can do to make your home safe and to help prevent falls. What can I do on the outside of my home?  Regularly fix the edges of walkways and driveways and fix any cracks.  Remove anything that might make you trip as you walk through a door, such as a raised step or threshold.  Trim any bushes or trees on the path to your home.  Use bright outdoor lighting.  Clear any walking paths of anything that might make someone trip, such as rocks or tools.  Regularly check to see if handrails are loose or broken. Make sure that both sides of any steps have handrails.  Any raised decks and porches should have guardrails on the edges.  Have any leaves, snow, or ice cleared regularly.  Use sand or salt on walking paths during winter.  Clean up any spills in your garage right away. This includes oil or grease spills. What can I do in the bathroom?  Use night lights.  Install grab bars by the toilet and in the tub and shower. Do not use towel bars as grab bars.  Use non-skid mats or decals in the tub or shower.  If you need to sit down in the shower, use a plastic, non-slip stool.  Keep the floor dry. Clean up any water that spills on the floor as soon as it  happens.  Remove soap buildup in the tub or shower regularly.  Attach bath mats securely with double-sided non-slip rug tape.  Do not have throw rugs and other things on the floor that can make you trip. What can I do in the bedroom?  Use night lights.  Make sure that you have a light by your bed that is easy to reach.  Do not use any sheets or blankets that are too big for your bed. They should not hang down onto the floor.  Have a firm chair that has side arms. You can use this for support while you get dressed.  Do not have throw rugs and other things on the floor that can make you trip. What can I do in the kitchen?  Clean up any spills right away.  Avoid walking on wet floors.  Keep items that you use a lot in easy-to-reach places.  If you need to reach something above you, use a strong step stool that has a grab bar.  Keep electrical cords out of the way.  Do not use floor polish or wax that makes floors slippery. If you must use wax, use non-skid floor wax.  Do not  have throw rugs and other things on the floor that can make you trip. What can I do with my stairs?  Do not leave any items on the stairs.  Make sure that there are handrails on both sides of the stairs and use them. Fix handrails that are broken or loose. Make sure that handrails are as long as the stairways.  Check any carpeting to make sure that it is firmly attached to the stairs. Fix any carpet that is loose or worn.  Avoid having throw rugs at the top or bottom of the stairs. If you do have throw rugs, attach them to the floor with carpet tape.  Make sure that you have a light switch at the top of the stairs and the bottom of the stairs. If you do not have them, ask someone to add them for you. What else can I do to help prevent falls?  Wear shoes that:  Do not have high heels.  Have rubber bottoms.  Are comfortable and fit you well.  Are closed at the toe. Do not wear sandals.  If you  use a stepladder:  Make sure that it is fully opened. Do not climb a closed stepladder.  Make sure that both sides of the stepladder are locked into place.  Ask someone to hold it for you, if possible.  Clearly mark and make sure that you can see:  Any grab bars or handrails.  First and last steps.  Where the edge of each step is.  Use tools that help you move around (mobility aids) if they are needed. These include:  Canes.  Walkers.  Scooters.  Crutches.  Turn on the lights when you go into a dark area. Replace any light bulbs as soon as they burn out.  Set up your furniture so you have a clear path. Avoid moving your furniture around.  If any of your floors are uneven, fix them.  If there are any pets around you, be aware of where they are.  Review your medicines with your doctor. Some medicines can make you feel dizzy. This can increase your chance of falling. Ask your doctor what other things that you can do to help prevent falls. This information is not intended to replace advice given to you by your health care provider. Make sure you discuss any questions you have with your health care provider. Document Released: 08/26/2009 Document Revised: 04/06/2016 Document Reviewed: 12/04/2014 Elsevier Interactive Patient Education  2017 [employer].

## 2019-06-23 NOTE — Progress Notes (Signed)
Subjective:   JAXYN ROUT is a 85 y.o. male who presents for Medicare Annual/Subsequent preventive examination.  This visit is being conducted via phone call  - after an attmept to do on video chat - due to the COVID-19 pandemic. This patient has given me verbal consent via phone to conduct this visit, patient states they are participating from their home address. Some vital signs may be absent or patient reported.   Patient identification: identified by name, DOB, and current address.    Review of Systems:  Cardiac Risk Factors include: advanced age (>51men, >35 women);male gender;hypertension;dyslipidemia     Objective:    Vitals: There were no vitals taken for this visit.  There is no height or weight on file to calculate BMI.  Advanced Directives 06/23/2019 06/19/2018 12/18/2016 10/29/2016 10/28/2016 06/05/2015 05/03/2015  Does Patient Have a Medical Advance Directive? Yes Yes Yes Yes Yes Yes Yes  Type of Advance Directive Living will;Healthcare Power of Attorney Living will;Healthcare Power of Attorney Living will;Healthcare Power of [place];Living will [place];Living will - [place]  Does patient want to make changes to medical advance directive? - - No - Patient declined No - Patient declined No - Patient declined No - Patient declined No - Patient declined  Copy of [place] in Chart? No - copy requested No - copy requested No - copy requested No - copy requested No - copy requested - -    Tobacco Social History   Tobacco Use  Smoking Status Current Some Day Smoker  . Packs/day: 0.25  . Years: 55.00  . Pack years: 13.75  . Types: Cigarettes  Smokeless Tobacco Never Used  Tobacco Comment   2-3 some days , doesnt usually smoke the whole thing     Ready to quit: No Counseling given: Yes Comment: 2-3 some days , doesnt usually smoke the whole thing   Clinical Intake:  Pre-visit  preparation completed: Yes  Pain : No/denies pain     Nutritional Risks: None Diabetes: No  How often do you need to have someone help you when you read instructions, pamphlets, or other written materials from your doctor or pharmacy?: 1 - Never  Interpreter Needed?: No  Information entered by ::  ,LPN  Past Medical History:  Diagnosis Date  . AAA (abdominal aortic aneurysm) (HCC)    2.9 CM infrarenal AAA on CT 11/2011  . Acute anterior wall MI ([place]) 05/29/2014  . Alcoholism ([place])   . Aneurysm of abdominal aorta (HCC)    SMALL  . Anxiety    panic attacks, rare.  . Aortic aneurysm (HCC)    small  . Arthritis   . Coronary artery disease    2 stents in july 2015  . Depression with anxiety   . Diverticulosis of colon 10/2009  . 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   . Gastritis 03/2012  . GERD (gastroesophageal reflux disease)   . GI bleed   . HFrEF (heart failure with reduced ejection fraction) ([place])   . HOH (hard of hearing)   . Hyperlipemia   . Hypertension   . Hypovolemic shock ([place])   . Kidney stones   . Myocardial infarction ([place]) 2015  . Panic attacks   . Positive H. pylori titer ?, before 2010   treated with Abx.   . Tubular adenoma of colon    Past Surgical History:  Procedure Laterality Date  . CARDIAC CATHETERIZATION  2015   Stents  . CATARACT EXTRACTION W/PHACO Right 09/21/2016   Procedure: CATARACT EXTRACTION PHACO AND INTRAOCULAR LENS PLACEMENT (IOC);  Surgeon: Eulogio Bear, MD;  Location: [facility] ORS;  Service: Ophthalmology;  Laterality: Right;  [place] 1.10 AP% 13.8 CDE 9.93 Fluid pack lot # [id number]  . CATARACT EXTRACTION W/PHACO Left 12/07/2016   Procedure: CATARACT EXTRACTION PHACO AND INTRAOCULAR LENS PLACEMENT (IOC);  Surgeon: Eulogio Bear, MD;  Location: [facility] ORS;  Service: Ophthalmology;  Laterality: Left;  [place] 00:42.7 ap 13.7 cde 5.85 lot #[id number]  . COLONOSCOPY WITH PROPOFOL Left 06/06/2015   Procedure: COLONOSCOPY  WITH PROPOFOL;  Surgeon: Hulen Luster, MD;  Location: [facility] ENDOSCOPY;  Service: Endoscopy;  Laterality: Left;  . CORONARY ANGIOPLASTY     2015  . LEFT HEART CATHETERIZATION WITH CORONARY ANGIOGRAM Bilateral 05/29/2014   Procedure: LEFT HEART CATHETERIZATION WITH CORONARY ANGIOGRAM;  Surgeon: Laverda Page, MD;  Location: [facility] CATH LAB;  Service: Cardiovascular;  Laterality: Bilateral;  . PERCUTANEOUS CORONARY STENT INTERVENTION (PCI-S)  05/29/2014   Procedure: PERCUTANEOUS CORONARY STENT INTERVENTION (PCI-S);  Surgeon: Laverda Page, MD;  Location: [facility] CATH LAB;  Service: Cardiovascular;;  DES x2 Prox and Mid LAD    Family History  Problem Relation Age of Onset  . Ulcers Father        stomach   . Heart disease Father   . Heart disease Mother   . Anuerysm Brother   . Hypertension Daughter   . Heart disease Paternal Grandmother   . Heart attack Paternal Grandmother   . Heart disease Paternal Grandfather   . Colon cancer Neg Hx    Social History   Socioeconomic History  . Marital status: Married    Spouse name: Not on file  . Number of children: 4  . Years of education: Not on file  . Highest education level: High school graduate  Occupational History  . Not on file  Social Needs  . Financial resource strain: Not hard at all  . Food insecurity    Worry: Never true    Inability: Never true  . Transportation needs    Medical: No    Non-medical: No  Tobacco Use  . Smoking status: Current Some Day Smoker    Packs/day: 0.25    Years: 55.00    Pack years: 13.75    Types: Cigarettes  . Smokeless tobacco: Never Used  . Tobacco comment: 2-3 some days , doesnt usually smoke the whole thing  Substance and Sexual Activity  . Alcohol use: No    Alcohol/week: 0.0 standard drinks  . Drug use: No  . Sexual activity: Never  Lifestyle  . Physical activity    Days per week: 4 days    Minutes per session: 10 min  . Stress: Not at all  Relationships  . Social Herbalist  on phone: Never    Gets together: More than three times a week    Attends religious service: More than 4 times per year    Active member of club or organization: Yes    Attends meetings of clubs or organizations: More than 4 times per year    Relationship status: Married  Other Topics Concern  . 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 atorvastatin (LIPITOR) 80 MG tablet Take 1 tablet (80 mg total)  by mouth daily at 6 PM.  . clopidogrel (PLAVIX) 75 MG tablet Take 1 tablet by mouth once daily  . diazepam (VALIUM) 5 MG tablet Take 5 mg by mouth every 8 (eight) hours as needed (for anxiety).   Marland Kitchen lisinopril (PRINIVIL,ZESTRIL) 5 MG tablet Take 1 tablet (5 mg total) by mouth daily.  . metoprolol succinate (TOPROL-XL) 25 MG 24 hr tablet Take 0.5 tablets (12.5 mg total) by mouth daily.  . traMADol (ULTRAM) 50 MG tablet Take 1 tablet (50 mg total) by mouth every 4 (four) hours as needed. (Patient taking differently: Take 50 mg by mouth every 4 (four) hours as needed (for pain). )  . famotidine (PEPCID) 20 MG tablet Take 1 tablet (20 mg total) by mouth 2 (two) times daily. (Patient not taking: Reported on 06/23/2019)  . nitroGLYCERIN (NITROSTAT) 0.4 MG SL tablet Place 0.4 mg under the tongue every 5 (five) minutes x 3 doses as needed for chest pain.  . [DISCONTINUED] ferrous sulfate (IRON SUPPLEMENT) 325 (65 FE) MG tablet Take 1 tablet (325 mg total) by mouth daily with breakfast. 3 times a week (Patient not taking: Reported on 06/23/2019)  . [DISCONTINUED] ondansetron (ZOFRAN-ODT) 4 MG disintegrating tablet DISSOLVE 1 TABLET IN MOUTH EVERY 6 HOURS AS NEEDED FOR NAUSEA AND VOMITING (NAUSEA/UPPER  ABDOMINAL  PAIN) (Patient not taking: Reported on 06/23/2019)   No facility-administered encounter medications on file as of 06/23/2019.     Activities of Daily Living In your present state of  health, do you have any difficulty performing the following activities: 06/23/2019  Hearing? Y  Comment no hearing aids  Vision? N  Comment eyeglasses, goes to [facility]  Difficulty concentrating or making decisions? N  Walking or climbing stairs? N  Dressing or bathing? N  Doing errands, shopping? N  Preparing Food and eating ? N  Using the Toilet? N  In the past six months, have you accidently leaked urine? N  Do you have problems with loss of bowel control? Y  Comment wears depends due to occasional diarrhea  Managing your Medications? N  Managing your Finances? N  Housekeeping or managing your Housekeeping? N  Some recent data might be hidden    Patient Care Team: Valerie Roys, DO as PCP - General (Family Medicine) Pyrtle, Lajuan Lines, MD as Consulting Physician (Gastroenterology) Nathanial Rancher, MD as Referring Physician (Anesthesiology) Despina Hick, MD as Referring Physician (Cardiology)   Assessment:   This is a routine wellness examination for Rosaire.  Exercise Activities and Dietary recommendations Current Exercise Habits: The patient does not participate in regular exercise at present, Exercise limited by: None identified  Goals    . Quit Smoking     Smoking cessation discussed       Fall Risk: Fall Risk  06/23/2019 01/07/2019 06/19/2018 12/25/2017 06/19/2017  Falls in the past year? 0 0 No No Yes  Number falls in past yr: - - - - 2 or more  Injury with Fall? - - - - No  Risk for fall due to : - - - - Medication side effect;Impaired balance/gait;Impaired mobility  Follow up - Falls evaluation completed - - Education provided;Falls prevention discussed    FALL RISK PREVENTION PERTAINING TO THE HOME:  Any stairs in or around the home? Yes  If so, are there any without handrails? No   Home free of loose throw rugs in walkways, pet beds, electrical cords, etc? Yes  Adequate lighting in your home to reduce risk of falls?  Yes   ASSISTIVE DEVICES UTILIZED TO  PREVENT FALLS:  Life alert? No  Use of a cane, walker or w/c? No  Grab bars in the bathroom? No  Shower chair or bench in shower? No  Elevated toilet seat or a handicapped toilet? No   TIMED UP AND GO:  Unable to perform   Depression Screen PHQ 2/9 Scores 06/23/2019 06/26/2018 06/19/2018 12/25/2017  PHQ - 2 Score 0 0 0 0  PHQ- 9 Score - 0 - 0    Cognitive Function     6CIT Screen 06/19/2018 12/25/2017 12/18/2016  What Year? 0 points 0 points 0 points  What month? 0 points 0 points 0 points  What time? 0 points 0 points 0 points  Count back from 20 0 points 0 points 0 points  Months in reverse 0 points 0 points 0 points  Repeat phrase 4 points 0 points 0 points  Total Score 4 0 0    Immunization History  Administered Date(s) Administered  . Influenza, High Dose Seasonal PF 07/22/2018  . Influenza-Unspecified 07/15/2015, 07/14/2017  . Pneumococcal Conjugate-13 12/18/2016  . Pneumococcal Polysaccharide-23 12/25/2017    Qualifies for Shingles Vaccine? Yes  Zostavax completed n/a . Due for Shingrix. Education has been provided regarding the importance of this vaccine. Pt has been advised to call insurance company to determine out of pocket expense. Advised may also receive vaccine at local pharmacy or Health Dept. Verbalized acceptance and understanding.  Tdap: up to date   Flu Vaccine: due 07/2019  Pneumococcal Vaccine: up to date   Screening Tests Health Maintenance  Topic Date Due  . INFLUENZA VACCINE  06/14/2019  . TETANUS/TDAP  11/14/2019  . PNA vac Low Risk Adult  Completed   Cancer Screenings:  Colorectal Screening: no longer required  Lung Cancer Screening: (Low Dose CT Chest recommended if Age 25-80 years, 30 pack-year currently smoking OR have quit w/in 15years.) does not qualify.     Additional Screening:  Hepatitis C Screening: does not qualify  Vision Screening: Recommended annual ophthalmology exams for early detection of glaucoma and other disorders of  the eye. Is the patient up to date with their annual eye exam?  Yes  Who is the provider or what is the name of the office in which the pt attends annual eye exams? [facility]    Dental Screening: Recommended annual dental exams for proper oral hygiene  Community Resource Referral:  CRR required this visit?  No        Plan:  I have personally reviewed and addressed the Medicare Annual Wellness questionnaire and have noted the following in the patient's chart:  A. Medical and social history B. Use of alcohol, tobacco or illicit drugs  C. Current medications and supplements D. Functional ability and status E.  Nutritional status F.  Physical activity G. Advance directives H. List of other physicians I.  Hospitalizations, surgeries, and ER visits in previous 12 months J.  [place] such as hearing and vision if needed, cognitive and depression L. Referrals and appointments   In addition, I have reviewed and discussed with patient certain preventive protocols, quality metrics, and best practice recommendations. A written personalized care plan for preventive services as well as general preventive health recommendations were provided to patient.   Signed,   Bevelyn Ngo, LPN  [id number] Nurse Health Advisor   Nurse Notes:  none

## 2019-06-25 ENCOUNTER — Encounter: Payer: Self-pay | Admitting: Internal Medicine

## 2019-06-30 ENCOUNTER — Encounter: Payer: Self-pay | Admitting: Internal Medicine

## 2019-06-30 ENCOUNTER — Ambulatory Visit (INDEPENDENT_AMBULATORY_CARE_PROVIDER_SITE_OTHER): Payer: Medicare HMO | Admitting: Internal Medicine

## 2019-06-30 VITALS — Ht 68.0 in | Wt 140.0 lb

## 2019-06-30 DIAGNOSIS — R1013 Epigastric pain: Secondary | ICD-10-CM

## 2019-06-30 DIAGNOSIS — K219 Gastro-esophageal reflux disease without esophagitis: Secondary | ICD-10-CM | POA: Diagnosis not present

## 2019-06-30 NOTE — Progress Notes (Signed)
Subjective:    Patient ID: Brian [name], male    DOB: 1933/08/09, 85 y.o.   MRN: [id number]  This service was provided via telemedicine.  Telephone visit  The patient was located at home The provider was located in provider's GI office. The patient did consent to this telephone visit and is aware of possible charges through their insurance for this visit.   The persons participating in this telemedicine service were the patient and I. Time spent on call: 14 min   HPI Brian [name] is an 85 year old male with a history of adenomatous polyps, history of diverticulosis, history of H. pylori status post treatment, history of alcohol-related gastritis, history of acquired duodenal stenosis with history of peptic ulcer disease, AAA, hypertension, hyperlipidemia, CAD on Plavix who seen for follow-up.  Seen virtually today in the setting of COVID-19.  He is seen by telephone visit.  He reports that he has been doing well.  He does have intermittent issues with having his appetite being a little less than usual.  He denies any recent weight loss.  He has lost his taste for meat and primarily eats vegetables.  He denies early satiety.  Denies nausea and vomiting.  Occasional upset stomach if he eats higher fat foods.  Occasional heartburn but no dysphagia or odynophagia.  He has been out of famotidine because this was on back order from the pharmacy.  He was switched from ranitidine to famotidine after ranitidine/Zantac was recalled.  Bowel movements have been regular for him of late.  No blood in his stool or melena.  He has been avoiding alcohol also.  He continues to live with his wife.  She just had CABG several weeks ago and is recovering.  There 4 daughters are actively involved in helping them at home.   Review of Systems As per HPI, otherwise negative  Current Medications, Allergies, Past Medical History, Past Surgical History, Family History and Social History were reviewed in [employer] record.     Objective:   Physical Exam No physical exam today, virtual visit  CBC    Component Value Date/Time   WBC 5.7 01/07/2019 1148   WBC 6.8 06/03/2018 0841   RBC 4.70 01/07/2019 1148   RBC 4.25 06/03/2018 0841   HGB 15.4 01/07/2019 1148   HCT 44.3 01/07/2019 1148   PLT 157 01/07/2019 1148   MCV 94 01/07/2019 1148   MCV 96 04/15/2012 0210   MCH 32.8 01/07/2019 1148   MCH 33.2 10/29/2016 0101   MCHC 34.8 01/07/2019 1148   MCHC 33.7 06/03/2018 0841   RDW 12.1 01/07/2019 1148   RDW 13.5 04/15/2012 0210   LYMPHSABS 1.5 01/07/2019 1148   MONOABS 0.6 06/03/2018 0841   EOSABS 0.2 01/07/2019 1148   BASOSABS 0.0 01/07/2019 1148   CMP     Component Value Date/Time   NA 143 01/07/2019 1148   NA 143 04/15/2012 0210   K 4.8 01/07/2019 1148   K 3.6 04/15/2012 0210   CL 106 01/07/2019 1148   CL 105 04/15/2012 0210   CO2 22 01/07/2019 1148   CO2 24 04/15/2012 0210   GLUCOSE 108 (H) 01/07/2019 1148   GLUCOSE 99 10/29/2016 0101   GLUCOSE 75 04/15/2012 0210   BUN 10 01/07/2019 1148   BUN 14 04/15/2012 0210   CREATININE 0.84 01/07/2019 1148   CREATININE 0.80 04/15/2012 0210   CALCIUM 9.5 01/07/2019 1148   CALCIUM 7.8 (L) 04/15/2012 0210   PROT 6.8 01/07/2019 1148  PROT 6.7 04/15/2012 0210   ALBUMIN 4.6 01/07/2019 1148   ALBUMIN 3.3 (L) 04/15/2012 0210   AST 20 01/07/2019 1148   AST 49 (H) 04/15/2012 0210   ALT 18 01/07/2019 1148   ALT 55 04/15/2012 0210   ALKPHOS 78 01/07/2019 1148   ALKPHOS 59 04/15/2012 0210   BILITOT 0.4 01/07/2019 1148   BILITOT 0.5 04/15/2012 0210   GFRNONAA 81 01/07/2019 1148   GFRNONAA >60 04/15/2012 0210   GFRAA 94 01/07/2019 1148   GFRAA >60 04/15/2012 0210          Assessment & Plan:  85 year old male with a history of adenomatous polyps, history of diverticulosis, history of H. pylori status post treatment, history of alcohol-related gastritis, history of acquired duodenal stenosis with history of peptic  ulcer disease, AAA, hypertension, hyperlipidemia, CAD on Plavix who seen for follow-up.    1.  GERD/dyspepsia/history of PUD --he does better over the years when on some form of acid suppression.  He has run out of famotidine and this may have affected his appetite.  He is not reporting any alarm symptoms at present.  I recommended that he go back on famotidine 20 mg twice a day. --Resume famotidine 20 mg twice a day --Please contact his daughter Suanne Marker at [phone].  She can obtain famotidine on [email or web address]  I checked today and there is 200 count available for around $22.  This may be the best way for him to obtain medications at present given that there has been some shortage in local pharmacies.  2.  History of colon polyps --surveillance colonoscopy has been deferred based on age which is also patient preference  He can be seen every 6 to 12 months, sooner if needed

## 2019-07-01 NOTE — Patient Instructions (Addendum)
Resume famotidine 20 mg twice a day.  Dr [name] notes that it seems you can obtain famotidine on [email or web address] He checked today and there is 200 count available for around $22.  This may be the best way to obtain medications at present given that there has been some shortage in local pharmacies.  Please follow up with Dr [name] in the office every 6 to 12 months, sooner if needed.  If you are age 83 or older, your body mass index should be between 23-30. Your Body mass index is 21.29 kg/m. If this is out of the aforementioned range listed, please consider follow up with your Primary Care Provider.  If you are age 79 or younger, your body mass index should be between 19-25. Your Body mass index is 21.29 kg/m. If this is out of the aformentioned range listed, please consider follow up with your Primary Care Provider.

## 2019-07-28 ENCOUNTER — Ambulatory Visit: Payer: Medicare HMO | Admitting: Cardiology

## 2019-08-02 ENCOUNTER — Other Ambulatory Visit: Payer: Self-pay | Admitting: Cardiology

## 2019-08-08 ENCOUNTER — Ambulatory Visit: Payer: Medicare HMO | Admitting: Cardiology

## 2019-08-15 ENCOUNTER — Ambulatory Visit: Payer: Medicare HMO | Admitting: Cardiology

## 2019-08-21 ENCOUNTER — Ambulatory Visit: Payer: Medicare HMO | Admitting: Cardiology

## 2019-08-21 ENCOUNTER — Encounter: Payer: Self-pay | Admitting: Cardiology

## 2019-08-21 ENCOUNTER — Other Ambulatory Visit: Payer: Self-pay

## 2019-08-21 VITALS — BP 126/86 | HR 71 | Ht 68.0 in | Wt 139.0 lb

## 2019-08-21 DIAGNOSIS — R69 Illness, unspecified: Secondary | ICD-10-CM | POA: Diagnosis not present

## 2019-08-21 DIAGNOSIS — I2510 Atherosclerotic heart disease of native coronary artery without angina pectoris: Secondary | ICD-10-CM

## 2019-08-21 DIAGNOSIS — I714 Abdominal aortic aneurysm, without rupture, unspecified: Secondary | ICD-10-CM

## 2019-08-21 DIAGNOSIS — F17210 Nicotine dependence, cigarettes, uncomplicated: Secondary | ICD-10-CM | POA: Diagnosis not present

## 2019-08-21 DIAGNOSIS — Z720 Tobacco use: Secondary | ICD-10-CM

## 2019-08-21 NOTE — Progress Notes (Signed)
Follow up visit  Subjective:   Brian [name], male    DOB: 14-May-1933, 85 y.o.   MRN: [id number]   Chief Complaint  Patient presents with  . Coronary Artery Disease  . Follow-up    HPI  85 y/o caucasian male with hypertension, hyperlipidemia, CAD h/o STEMI and ostial and mid LAD PCI 2015, AAA 2.9 cm (2013).  Patient is doing well without any symptoms of chest pain, shortness of breath, palpitations, leg edema, orthopnea, PND, TIA/syncope. He continues to smoke 3-4 cigarettes. He says he gets his "nicotine fix" with few puffs of a large cigarette and then extinguishes his cigarette.   Past Medical History:  Diagnosis Date  . AAA (abdominal aortic aneurysm) (HCC)    2.9 CM infrarenal AAA on CT 11/2011  . Acute anterior wall MI ([place]) 05/29/2014  . Alcoholism ([place])   . Aneurysm of abdominal aorta (HCC)    SMALL  . Anxiety    panic attacks, rare.  . Aortic aneurysm (HCC)    small  . Arthritis   . Coronary artery disease    2 stents in july 2015  . Depression with anxiety   . Diverticulosis of colon 10/2009  . 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   . Gastritis 03/2012  . GERD (gastroesophageal reflux disease)   . GI bleed   . HFrEF (heart failure with reduced ejection fraction) ([place])   . HOH (hard of hearing)   . Hyperlipemia   . Hypertension   . Hypovolemic shock ([place])   . Kidney stones   . Myocardial infarction ([place]) 2015  . Panic attacks   . Positive H. pylori titer ?, before 2010   treated with Abx.   . Tubular adenoma of colon      Past Surgical History:  Procedure Laterality Date  . CARDIAC CATHETERIZATION  2015   Stents  . CATARACT EXTRACTION W/PHACO Right 09/21/2016   Procedure: CATARACT EXTRACTION PHACO AND INTRAOCULAR LENS PLACEMENT (IOC);  Surgeon: Eulogio Bear, MD;  Location: [facility] ORS;  Service: Ophthalmology;  Laterality: Right;  [place] 1.10 AP% 13.8 CDE 9.93 Fluid pack lot # [id number]  . CATARACT EXTRACTION W/PHACO Left 12/07/2016   Procedure: CATARACT EXTRACTION PHACO AND INTRAOCULAR LENS PLACEMENT (IOC);  Surgeon: Eulogio Bear, MD;  Location: [facility] ORS;  Service: Ophthalmology;  Laterality: Left;  [place] 00:42.7 ap 13.7 cde 5.85 lot [id number]  . COLONOSCOPY WITH PROPOFOL Left 06/06/2015   Procedure: COLONOSCOPY WITH PROPOFOL;  Surgeon: Hulen Luster, MD;  Location: [facility] ENDOSCOPY;  Service: Endoscopy;  Laterality: Left;  . CORONARY ANGIOPLASTY     2015  . LEFT HEART CATHETERIZATION WITH CORONARY ANGIOGRAM Bilateral 05/29/2014   Procedure: LEFT HEART CATHETERIZATION WITH CORONARY ANGIOGRAM;  Surgeon: Laverda Page, MD;  Location: [facility] CATH LAB;  Service: Cardiovascular;  Laterality: Bilateral;  . PERCUTANEOUS CORONARY STENT INTERVENTION (PCI-S)  05/29/2014   Procedure: PERCUTANEOUS CORONARY STENT INTERVENTION (PCI-S);  Surgeon: Laverda Page, MD;  Location: [facility] CATH LAB;  Service: Cardiovascular;;  DES x2 Prox and Mid LAD      Social History   Socioeconomic History  . Marital status: Married    Spouse name: Not on file  . Number of children: 4  . Years of education: Not on file  . Highest education level: High school graduate  Occupational History  . Not on file  Social Needs  . Financial resource strain: Not hard at all  . Food insecurity    Worry: Never true  Inability: Never true  . Transportation needs    Medical: No    Non-medical: No  Tobacco Use  . Smoking status: Current Some Day Smoker    Packs/day: 0.25    Years: 55.00    Pack years: 13.75    Types: Cigarettes  . Smokeless tobacco: Never Used  . Tobacco comment: 2-3 some days , doesnt usually smoke the whole thing  Substance and Sexual Activity  . Alcohol use: No    Alcohol/week: 0.0 standard drinks  . Drug use: No  . Sexual activity: Never  Lifestyle  . Physical activity    Days per week: 4 days    Minutes per session: 10 min  . Stress: Not at all  Relationships  . Social Herbalist on phone: Never    Gets together:  More than three times a week    Attends religious service: More than 4 times per year    Active member of club or organization: Yes    Attends meetings of clubs or organizations: More than 4 times per year    Relationship status: Married  . Intimate partner violence    Fear of current or ex partner: No    Emotionally abused: No    Physically abused: No    Forced sexual activity: No  Other Topics Concern  . 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 Ulcers Father        stomach   . Heart disease Father   . Heart disease Mother   . Anuerysm Brother   . Hypertension Daughter   . Heart disease Paternal Grandmother   . Heart attack Paternal Grandmother   . Heart disease Paternal Grandfather   . Colon cancer Neg Hx      Current Outpatient Medications on File Prior to Visit  Medication Sig Dispense Refill  . atorvastatin (LIPITOR) 80 MG tablet Take 1 tablet (80 mg total) by mouth daily at 6 PM. 90 tablet 3  . clopidogrel (PLAVIX) 75 MG tablet Take 1 tablet by mouth once daily 90 tablet 0  . diazepam (VALIUM) 5 MG tablet Take 5 mg by mouth every 8 (eight) hours as needed (for anxiety).     . famotidine (PEPCID) 20 MG tablet Take 1 tablet (20 mg total) by mouth 2 (two) times daily. (Patient not taking: Reported on 06/23/2019) 180 tablet 0  . lisinopril (PRINIVIL,ZESTRIL) 5 MG tablet Take 1 tablet (5 mg total) by mouth daily. 90 tablet 1  . metoprolol succinate (TOPROL-XL) 25 MG 24 hr tablet Take 0.5 tablets (12.5 mg total) by mouth daily. 45 tablet 1  . nitroGLYCERIN (NITROSTAT) 0.4 MG SL tablet Place 0.4 mg under the tongue every 5 (five) minutes x 3 doses as needed for chest pain.    . traMADol (ULTRAM) 50 MG tablet Take 1 tablet (50 mg total) by mouth every 4 (four) hours as needed. (Patient taking differently: Take 50 mg by mouth every 4 (four) hours as needed (for pain). ) 60 tablet 0    No current facility-administered medications on file prior to visit.     Cardiovascular studies:  EKG 08/21/2019: Sinus rhythm 56 bpm. Left anterior fascicular block.    Echocardiogram 10/29/2016: - Technically difficult study with poor images. Normal LV size with   EF 60-65%. Normal RV size and systolic function.  No significant   valvular abnormalities.  Coronary angiography and intervention 2015: LM: Normal LAD: Ostial 80% stenosis-->successful but suboptimal stenting 3.0X12 mm promus Premier      Dissufe and aneurysmal mid vessel disease-->2.5X28 mm Promus Premier      Distap apical LAD embolization LCx: Prox 30-40%, mid ectatic 30-40% stenosis.  RCA: Dominant. Mid 20-30% stenosis. Mild diffuse disease with ectasia in mid to distal segement. RPL 10-15% stenosis.  CT abdomen w/contrast 2013: 1. Diverticulosis of the visualized colon.  2. Borderline infrarenal abdominal aortic aneurysm 2.9 cm, slightly increased  from  prior.  3. Left-sided nephrolithiasis, without hydronephrosis.       Recent labs: Results for MARZ, JANICE (MRN [id number]) as of 08/21/2019 12:28  Ref. Range 01/07/2019 11:48  Sodium Latest Ref Range: 134 - 144 mmol/L 143  Potassium Latest Ref Range: 3.5 - 5.2 mmol/L 4.8  Chloride Latest Ref Range: 96 - 106 mmol/L 106  CO2 Latest Ref Range: 20 - 29 mmol/L 22  Glucose Latest Ref Range: 65 - 99 mg/dL 108 (H)  BUN Latest Ref Range: 8 - 27 mg/dL 10  Creatinine Latest Ref Range: 0.76 - 1.27 mg/dL 0.84  Calcium Latest Ref Range: 8.6 - 10.2 mg/dL 9.5  BUN/Creatinine Ratio Latest Ref Range: 10 - 24  12  Alkaline Phosphatase Latest Ref Range: 39 - 117 IU/L 78  Albumin Latest Ref Range: 3.6 - 4.6 g/dL 4.6  Albumin/Globulin Ratio Latest Ref Range: 1.2 - 2.2  2.1  AST Latest Ref Range: 0 - 40 IU/L 20  ALT Latest Ref Range: 0 - 44 IU/L 18  Total Protein Latest Ref Range: 6.0 - 8.5 g/dL 6.8  Total Bilirubin Latest Ref Range: 0.0 - 1.2 mg/dL 0.4  GFR, Est Non  African American Latest Ref Range: >59 mL/min/1.73 81  GFR, Est African American Latest Ref Range: >59 mL/min/1.73 94  Cholesterol, Total Latest Ref Range: 100 - 199 mg/dL 134  HDL Cholesterol Latest Ref Range: >39 mg/dL 62  LDL (calc) Latest Ref Range: 0 - 99 mg/dL 58  Triglycerides Latest Ref Range: 0 - 149 mg/dL 68  VLDL Cholesterol Cal Latest Ref Range: 5 - 40 mg/dL 14   Results for EISEN, SILVERMAN (MRN [id number]) as of 08/21/2019 12:28  Ref. Range 01/07/2019 11:48  WBC Latest Ref Range: 3.4 - 10.8 x10E3/uL 5.7  RBC Latest Ref Range: 4.14 - 5.80 x10E6/uL 4.70  Hemoglobin Latest Ref Range: 13.0 - 17.7 g/dL 15.4  HCT Latest Ref Range: 37.5 - 51.0 % 44.3  MCV Latest Ref Range: 79 - 97 fL 94  MCH Latest Ref Range: 26.6 - 33.0 pg 32.8  MCHC Latest Ref Range: 31.5 - 35.7 g/dL 34.8  RDW Latest Ref Range: 11.6 - 15.4 % 12.1  Platelets Latest Ref Range: 150 - 450 x10E3/uL 157   Results for CHARVIK, ALLDREDGE (MRN [id number]) as of 08/21/2019 12:28  Ref. Range 01/07/2019 11:48  Cholesterol, Total Latest Ref Range: 100 - 199 mg/dL 134  HDL Cholesterol Latest Ref Range: >39 mg/dL 62  LDL (calc) Latest Ref Range: 0 - 99 mg/dL 58  Triglycerides Latest Ref Range: 0 - 149 mg/dL 68  VLDL Cholesterol Cal Latest Ref Range: 5 - 40 mg/dL 14     Review of Systems  Constitution: Negative for decreased appetite, malaise/fatigue, weight gain and weight loss.  HENT: Negative for congestion.   Eyes: Negative for visual disturbance.  Cardiovascular: Negative for chest pain, dyspnea on exertion, leg swelling, palpitations and syncope.  Respiratory: Negative  for cough.   Endocrine: Negative for cold intolerance.  Hematologic/Lymphatic: Does not bruise/bleed easily.  Skin: Negative for itching and rash.  Musculoskeletal: Negative for myalgias.  Gastrointestinal: Negative for abdominal pain, nausea and vomiting.  Genitourinary: Negative for dysuria.  Neurological: Negative for dizziness and weakness.   Psychiatric/Behavioral: The patient is not nervous/anxious.   All other systems reviewed and are negative.        Vitals:   08/21/19 1444  BP: 126/86  Pulse: 71  SpO2: 94%    Body mass index is 21.13 kg/m. Filed Weights   08/21/19 1444  Weight: 139 lb (63 kg)    Objective:   Physical Exam  Constitutional: He is oriented to person, place, and time. He appears well-developed and well-nourished. No distress.  HENT:  Head: Normocephalic and atraumatic.  Eyes: Pupils are equal, round, and reactive to light. Conjunctivae are normal.  Neck: No JVD present.  Cardiovascular: Normal rate and regular rhythm.  No murmur heard. Pulses:      Dorsalis pedis pulses are 1+ on the left side.       Posterior tibial pulses are 1+ on the right side and 1+ on the left side.  Pulmonary/Chest: Effort normal and breath sounds normal. He has no wheezes. He has no rales.  Abdominal: Soft. Bowel sounds are normal. There is no rebound.  Musculoskeletal:        General: No edema.  Lymphadenopathy:    He has no cervical adenopathy.  Neurological: He is alert and oriented to person, place, and time. No cranial nerve deficit.  Skin: Skin is warm and dry.  Psychiatric: He has a normal mood and affect.  Nursing note and vitals reviewed.         Assessment & Recommendations:   85 y/o caucasian male with hypertension, hyperlipidemia, CAD h/o STEMI and ostial and mid LAD PCI 2015, AAA 2.9 cm (2013).  CAD: Stable with no angina symptoms.  He has been on plavix without Aspirin with no bleeding issues. Continue the same, along ith Lipitor 10, metoprolol succinate 12.5 mg, lisinopril 5 mg.  AAA:  2.9 cm on CT scan in 2013.  I do not see any imaging since then. Will obtain US duplex.   Tobacco cessation counseling: - Currently smoking 3-4 cigarettes/day   - Patient was informed of the dangers of tobacco abuse including stroke, cancer, and MI, as well as benefits of tobacco cessation. - Patient  is not willing to quit at this time. - Approximately 5 mins were spent counseling patient cessation techniques. We discussed various methods to help quit smoking, including deciding on a date to quit, joining a support group, pharmacological agents. He - I will reassess his progress at the next follow-up visit   F/u in 1 year unless signifciant abnormalities found on the abdominal aorta US.  Nigel Mormon, MD [facility] Pager: [phone] Office: [phone] If no answer Cell [phone]

## 2019-08-23 ENCOUNTER — Encounter: Payer: Self-pay | Admitting: Cardiology

## 2019-08-25 DIAGNOSIS — G894 Chronic pain syndrome: Secondary | ICD-10-CM | POA: Diagnosis not present

## 2019-08-25 DIAGNOSIS — R69 Illness, unspecified: Secondary | ICD-10-CM | POA: Diagnosis not present

## 2019-08-25 DIAGNOSIS — M545 Low back pain: Secondary | ICD-10-CM | POA: Diagnosis not present

## 2019-08-25 DIAGNOSIS — F1120 Opioid dependence, uncomplicated: Secondary | ICD-10-CM | POA: Diagnosis not present

## 2019-08-25 DIAGNOSIS — Z79899 Other long term (current) drug therapy: Secondary | ICD-10-CM | POA: Diagnosis not present

## 2019-08-26 ENCOUNTER — Telehealth: Payer: Self-pay

## 2019-09-05 ENCOUNTER — Other Ambulatory Visit: Payer: Self-pay

## 2019-09-05 ENCOUNTER — Ambulatory Visit (INDEPENDENT_AMBULATORY_CARE_PROVIDER_SITE_OTHER): Payer: Medicare HMO

## 2019-09-05 DIAGNOSIS — I714 Abdominal aortic aneurysm, without rupture, unspecified: Secondary | ICD-10-CM

## 2019-09-08 ENCOUNTER — Other Ambulatory Visit: Payer: Self-pay | Admitting: Cardiology

## 2019-09-08 DIAGNOSIS — I714 Abdominal aortic aneurysm, without rupture, unspecified: Secondary | ICD-10-CM

## 2019-09-08 NOTE — Progress Notes (Signed)
Pt aware.

## 2019-10-02 ENCOUNTER — Other Ambulatory Visit: Payer: Self-pay

## 2019-10-02 ENCOUNTER — Ambulatory Visit (INDEPENDENT_AMBULATORY_CARE_PROVIDER_SITE_OTHER): Payer: Medicare HMO | Admitting: Family Medicine

## 2019-10-02 ENCOUNTER — Encounter: Payer: Self-pay | Admitting: Family Medicine

## 2019-10-02 VITALS — BP 157/91 | HR 76 | Temp 97.8°F | Ht 67.0 in | Wt 139.0 lb

## 2019-10-02 DIAGNOSIS — I129 Hypertensive chronic kidney disease with stage 1 through stage 4 chronic kidney disease, or unspecified chronic kidney disease: Secondary | ICD-10-CM

## 2019-10-02 DIAGNOSIS — F329 Major depressive disorder, single episode, unspecified: Secondary | ICD-10-CM

## 2019-10-02 DIAGNOSIS — Z23 Encounter for immunization: Secondary | ICD-10-CM

## 2019-10-02 DIAGNOSIS — I2510 Atherosclerotic heart disease of native coronary artery without angina pectoris: Secondary | ICD-10-CM

## 2019-10-02 DIAGNOSIS — I714 Abdominal aortic aneurysm, without rupture, unspecified: Secondary | ICD-10-CM

## 2019-10-02 DIAGNOSIS — I5020 Unspecified systolic (congestive) heart failure: Secondary | ICD-10-CM | POA: Diagnosis not present

## 2019-10-02 DIAGNOSIS — E782 Mixed hyperlipidemia: Secondary | ICD-10-CM

## 2019-10-02 DIAGNOSIS — E440 Moderate protein-calorie malnutrition: Secondary | ICD-10-CM | POA: Diagnosis not present

## 2019-10-02 DIAGNOSIS — Z0000 Encounter for general adult medical examination without abnormal findings: Secondary | ICD-10-CM

## 2019-10-02 DIAGNOSIS — F419 Anxiety disorder, unspecified: Secondary | ICD-10-CM

## 2019-10-02 DIAGNOSIS — R69 Illness, unspecified: Secondary | ICD-10-CM | POA: Diagnosis not present

## 2019-10-02 DIAGNOSIS — F32A Depression, unspecified: Secondary | ICD-10-CM

## 2019-10-02 LAB — UA/M W/RFLX CULTURE, ROUTINE
Bilirubin, UA: NEGATIVE
Glucose, UA: NEGATIVE
Ketones, UA: NEGATIVE
Leukocytes,UA: NEGATIVE
Nitrite, UA: NEGATIVE
Protein,UA: NEGATIVE
RBC, UA: NEGATIVE
Specific Gravity, UA: 1.01 (ref 1.005–1.030)
Urobilinogen, Ur: 0.2 mg/dL (ref 0.2–1.0)
pH, UA: 6 (ref 5.0–7.5)

## 2019-10-02 LAB — MICROALBUMIN, URINE WAIVED
Creatinine, Urine Waived: 10 mg/dL (ref 10–300)
Microalb, Ur Waived: 10 mg/L (ref 0–19)

## 2019-10-02 MED ORDER — METOPROLOL SUCCINATE ER 25 MG PO TB24: 12.5000 mg | ORAL_TABLET | Freq: Every day | ORAL | 1 refills | Status: DC

## 2019-10-02 MED ORDER — LISINOPRIL 5 MG PO TABS: 5.0000 mg | ORAL_TABLET | Freq: Every day | ORAL | 1 refills | Status: DC

## 2019-10-02 NOTE — Patient Instructions (Signed)
Health Maintenance After Age 83 After age 83, you are at a higher risk for certain long-term diseases and infections as well as injuries from falls. Falls are a major cause of broken bones and head injuries in people who are older than age 83. Getting regular preventive care can help to keep you healthy and well. Preventive care includes getting regular testing and making lifestyle changes as recommended by your health care provider. Talk with your health care provider about:  Which screenings and tests you should have. A screening is a test that checks for a disease when you have no symptoms.  A diet and exercise plan that is right for you. What should I know about screenings and tests to prevent falls? Screening and testing are the best ways to find a health problem early. Early diagnosis and treatment give you the best chance of managing medical conditions that are common after age 83. Certain conditions and lifestyle choices may make you more likely to have a fall. Your health care provider may recommend:  Regular vision checks. Poor vision and conditions such as cataracts can make you more likely to have a fall. If you wear glasses, make sure to get your prescription updated if your vision changes.  Medicine review. Work with your health care provider to regularly review all of the medicines you are taking, including over-the-counter medicines. Ask your health care provider about any side effects that may make you more likely to have a fall. Tell your health care provider if any medicines that you take make you feel dizzy or sleepy.  Osteoporosis screening. Osteoporosis is a condition that causes the bones to get weaker. This can make the bones weak and cause them to break more easily.  Blood pressure screening. Blood pressure changes and medicines to control blood pressure can make you feel dizzy.  Strength and balance checks. Your health care provider may recommend certain tests to check your  strength and balance while standing, walking, or changing positions.  Foot health exam. Foot pain and numbness, as well as not wearing proper footwear, can make you more likely to have a fall.  Depression screening. You may be more likely to have a fall if you have a fear of falling, feel emotionally low, or feel unable to do activities that you used to do.  Alcohol use screening. Using too much alcohol can affect your balance and may make you more likely to have a fall. What actions can I take to lower my risk of falls? General instructions  Talk with your health care provider about your risks for falling. Tell your health care provider if: ? You fall. Be sure to tell your health care provider about all falls, even ones that seem minor. ? You feel dizzy, sleepy, or off-balance.  Take over-the-counter and prescription medicines only as told by your health care provider. These include any supplements.  Eat a healthy diet and maintain a healthy weight. A healthy diet includes low-fat dairy products, low-fat (lean) meats, and fiber from whole grains, beans, and lots of fruits and vegetables. Home safety  Remove any tripping hazards, such as rugs, cords, and clutter.  Install safety equipment such as grab bars in bathrooms and safety rails on stairs.  Keep rooms and walkways well-lit. Activity   Follow a regular exercise program to stay fit. This will help you maintain your balance. Ask your health care provider what types of exercise are appropriate for you.  If you need a cane or   walker, use it as recommended by your health care provider.  Wear supportive shoes that have nonskid soles. Lifestyle  Do not drink alcohol if your health care provider tells you not to drink.  If you drink alcohol, limit how much you have: ? 0-1 drink a day for women. ? 0-2 drinks a day for men.  Be aware of how much alcohol is in your drink. In the [place] one drink equals one typical bottle of beer (12  oz), one-half glass of wine (5 oz), or one shot of hard liquor (1 oz).  Do not use any products that contain nicotine or tobacco, such as cigarettes and e-cigarettes. If you need help quitting, ask your health care provider. Summary  Having a healthy lifestyle and getting preventive care can help to protect your health and wellness after age 83.  Screening and testing are the best way to find a health problem early and help you avoid having a fall. Early diagnosis and treatment give you the best chance for managing medical conditions that are more common for people who are older than age 83.  Falls are a major cause of broken bones and head injuries in people who are older than age 83. Take precautions to prevent a fall at home.  Work with your health care provider to learn what changes you can make to improve your health and wellness and to prevent falls. This information is not intended to replace advice given to you by your health care provider. Make sure you discuss any questions you have with your health care provider. Document Released: 09/12/2017 Document Revised: 02/20/2019 Document Reviewed: 09/12/2017 Elsevier Patient Education  2020 [employer]  

## 2019-10-02 NOTE — Progress Notes (Signed)
BP (!) 157/91   Pulse 76   Temp 97.8 F (36.6 C) (Oral)   Ht '5\' 7"'  (1.702 m)   Wt 139 lb (63 kg)   SpO2 98%   BMI 21.77 kg/m    Subjective:    Patient ID: Brian [name], male    DOB: 03-04-1933, 85 y.o.   MRN: [id number]  HPI: Brian [name] is a 85 y.o. male presenting on 10/02/2019 for comprehensive medical examination. Current medical complaints include:  HYPERTENSION / HYPERLIPIDEMIA Satisfied with current treatment? yes Duration of hypertension: chronic BP monitoring frequency: not checking BP medication side effects: no Past BP meds: metoprolol, lisinopril Duration of hyperlipidemia: chronic Cholesterol medication side effects: no Cholesterol supplements: none Past cholesterol medications: atorvastatin Medication compliance: excellent compliance Aspirin: no Recent stressors: no Recurrent headaches: no Visual changes: no Palpitations: no Dyspnea: no Chest pain: no Lower extremity edema: no Dizzy/lightheaded: no  He currently lives with: Wife Interim Problems from his last visit: no  Depression Screen done today and results listed below:  Depression screen [facility] 2/9 10/02/2019 06/23/2019 06/26/2018 06/19/2018 12/25/2017  Decreased Interest 0 0 0 0 0  Down, Depressed, Hopeless 0 0 0 0 0  PHQ - 2 Score 0 0 0 0 0  Altered sleeping 0 - 0 - 0  Tired, decreased energy 0 - 0 - 0  Change in appetite 0 - 0 - 0  Feeling bad or failure about yourself  0 - 0 - 0  Trouble concentrating 0 - 0 - 0  Moving slowly or fidgety/restless 0 - 0 - 0  Suicidal thoughts 0 - 0 - 0  PHQ-9 Score 0 - 0 - 0  Difficult doing work/chores Not difficult at all - Not difficult at all - -    Past Medical History:  Past Medical History:  Diagnosis Date  . AAA (abdominal aortic aneurysm) (HCC)    2.9 CM infrarenal AAA on CT 11/2011  . Acute anterior wall MI ([place]) 05/29/2014  . Alcoholism ([place])   . Aneurysm of abdominal aorta (HCC)    SMALL  . Anxiety    panic attacks, rare.  . Aortic  aneurysm (HCC)    small  . Arthritis   . Coronary artery disease    2 stents in july 2015  . Depression with anxiety   . Diverticulosis of colon 10/2009  . 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   . Gastritis 03/2012  . GERD (gastroesophageal reflux disease)   . GI bleed   . HFrEF (heart failure with reduced ejection fraction) ([place])   . HOH (hard of hearing)   . Hyperlipemia   . Hypertension   . Hypovolemic shock ([place])   . Kidney stones   . Myocardial infarction ([place]) 2015  . Panic attacks   . Positive H. pylori titer ?, before 2010   treated with Abx.   . Tubular adenoma of colon     Surgical History:  Past Surgical History:  Procedure Laterality Date  . CARDIAC CATHETERIZATION  2015   Stents  . CATARACT EXTRACTION W/PHACO Right 09/21/2016   Procedure: CATARACT EXTRACTION PHACO AND INTRAOCULAR LENS PLACEMENT (IOC);  Surgeon: Eulogio Bear, MD;  Location: [facility] ORS;  Service: Ophthalmology;  Laterality: Right;  [place] 1.10 AP% 13.8 CDE 9.93 Fluid pack lot # [id number]  . CATARACT EXTRACTION W/PHACO Left 12/07/2016   Procedure: CATARACT EXTRACTION PHACO AND INTRAOCULAR LENS PLACEMENT (IOC);  Surgeon: Eulogio Bear, MD;  Location: [facility] ORS;  Service: Ophthalmology;  Laterality: Left;  [place] 00:42.7 ap 13.7 cde 5.85 lot #[id number]  . COLONOSCOPY WITH PROPOFOL Left 06/06/2015   Procedure: COLONOSCOPY WITH PROPOFOL;  Surgeon: Hulen Luster, MD;  Location: [facility] ENDOSCOPY;  Service: Endoscopy;  Laterality: Left;  . CORONARY ANGIOPLASTY     2015  . LEFT HEART CATHETERIZATION WITH CORONARY ANGIOGRAM Bilateral 05/29/2014   Procedure: LEFT HEART CATHETERIZATION WITH CORONARY ANGIOGRAM;  Surgeon: Laverda Page, MD;  Location: [facility] CATH LAB;  Service: Cardiovascular;  Laterality: Bilateral;  . PERCUTANEOUS CORONARY STENT INTERVENTION (PCI-S)  05/29/2014   Procedure: PERCUTANEOUS CORONARY STENT INTERVENTION (PCI-S);  Surgeon: Laverda Page, MD;  Location: [facility] CATH LAB;  Service:  Cardiovascular;;  DES x2 Prox and Mid LAD     Medications:  Current Outpatient Medications on File Prior to Visit  Medication Sig  . atorvastatin (LIPITOR) 80 MG tablet Take 1 tablet (80 mg total) by mouth daily at 6 PM.  . clopidogrel (PLAVIX) 75 MG tablet Take 1 tablet by mouth once daily  . diazepam (VALIUM) 5 MG tablet Take 5 mg by mouth every 8 (eight) hours as needed (for anxiety).   . famotidine (PEPCID) 20 MG tablet Take 1 tablet (20 mg total) by mouth 2 (two) times daily.  . nitroGLYCERIN (NITROSTAT) 0.4 MG SL tablet Place 0.4 mg under the tongue every 5 (five) minutes x 3 doses as needed for chest pain.  . traMADol (ULTRAM) 50 MG tablet Take 1 tablet (50 mg total) by mouth every 4 (four) hours as needed. (Patient taking differently: Take 50 mg by mouth every 4 (four) hours as needed (for pain). )   No current facility-administered medications on file prior to visit.     Allergies:  Allergies  Allergen Reactions  . Zoloft [Sertraline Hcl] Nausea Only    Patient states that he was not himself when he took this drug    Social History:  Social History   Socioeconomic History  . Marital status: Married    Spouse name: Not on file  . Number of children: 4  . Years of education: Not on file  . Highest education level: High school graduate  Occupational History  . Not on file  Social Needs  . Financial resource strain: Not hard at all  . Food insecurity    Worry: Never true    Inability: Never true  . Transportation needs    Medical: No    Non-medical: No  Tobacco Use  . Smoking status: Current Some Day Smoker    Packs/day: 0.25    Years: 55.00    Pack years: 13.75    Types: Cigarettes  . Smokeless tobacco: Never Used  . Tobacco comment: 4-5 cigaretts daily   Substance and Sexual Activity  . Alcohol use: No    Alcohol/week: 0.0 standard drinks  . Drug use: No  . Sexual activity: Never  Lifestyle  . Physical activity    Days per week: 4 days    Minutes per  session: 10 min  . Stress: Not at all  Relationships  . Social Herbalist on phone: Never    Gets together: More than three times a week    Attends religious service: More than 4 times per year    Active member of club or organization: Yes    Attends meetings of clubs or organizations: More than 4 times per year    Relationship status: Married  . Intimate partner violence    Fear of current or ex  partner: No    Emotionally abused: No    Physically abused: No    Forced sexual activity: No  Other Topics Concern  . Not on file  Social History Narrative   Working 3.5 hours a day 6 days a week in [employer]    Social History   Tobacco Use  Smoking Status Current Some Day Smoker  . Packs/day: 0.25  . Years: 55.00  . Pack years: 13.75  . Types: Cigarettes  Smokeless Tobacco Never Used  Tobacco Comment   4-5 cigaretts daily    Social History   Substance and Sexual Activity  Alcohol Use No  . Alcohol/week: 0.0 standard drinks    Family History:  Family History  Problem Relation Age of Onset  . Ulcers Father        stomach   . Heart disease Father   . Heart disease Mother   . Anuerysm Brother   . Hypertension Daughter   . Heart disease Paternal Grandmother   . Heart attack Paternal Grandmother   . Heart disease Paternal Grandfather   . Colon cancer Neg Hx     Past medical history, surgical history, medications, allergies, family history and social history reviewed with patient today and changes made to appropriate areas of the chart.   Review of Systems  Constitutional: Negative.   HENT: Negative.   Eyes: Negative.   Respiratory: Negative.   Cardiovascular: Negative.   Gastrointestinal: Negative.   Genitourinary: Negative.   Musculoskeletal: Positive for myalgias. Negative for back pain, falls, joint pain and neck pain.  Skin: Negative.   Neurological: Positive for dizziness. Negative for tingling, tremors, sensory change, speech change, focal weakness,  seizures, loss of consciousness, weakness and headaches.  Psychiatric/Behavioral: Negative.     All other ROS negative except what is listed above and in the HPI.      Objective:    BP (!) 157/91   Pulse 76   Temp 97.8 F (36.6 C) (Oral)   Ht '5\' 7"'  (1.702 m)   Wt 139 lb (63 kg)   SpO2 98%   BMI 21.77 kg/m   Wt Readings from Last 3 Encounters:  10/02/19 139 lb (63 kg)  08/21/19 139 lb (63 kg)  06/30/19 140 lb (63.5 kg)    Physical Exam Vitals signs and nursing note reviewed.  Constitutional:      General: He is not in acute distress.    Appearance: Normal appearance. He is normal weight. He is not ill-appearing, toxic-appearing or diaphoretic.  HENT:     Head: Normocephalic and atraumatic.     Right Ear: Tympanic membrane, ear canal and external ear normal. There is no impacted cerumen.     Left Ear: Tympanic membrane, ear canal and external ear normal. There is no impacted cerumen.     Nose: Nose normal. No congestion or rhinorrhea.     Mouth/Throat:     Mouth: Mucous membranes are moist.     Pharynx: Oropharynx is clear. No oropharyngeal exudate or posterior oropharyngeal erythema.  Eyes:     General: No scleral icterus.       Right eye: No discharge.        Left eye: No discharge.     Extraocular Movements: Extraocular movements intact.     Conjunctiva/sclera: Conjunctivae normal.     Pupils: Pupils are equal, round, and reactive to light.  Neck:     Musculoskeletal: Normal range of motion and neck supple. No neck rigidity or muscular tenderness.  Vascular: No carotid bruit.  Cardiovascular:     Rate and Rhythm: Normal rate and regular rhythm.     Pulses: Normal pulses.     Heart sounds: No murmur. No friction rub. No gallop.   Pulmonary:     Effort: Pulmonary effort is normal. No respiratory distress.     Breath sounds: Normal breath sounds. No stridor. No wheezing, rhonchi or rales.  Chest:     Chest wall: No tenderness.  Abdominal:     General: Abdomen  is flat. Bowel sounds are normal. There is no distension.     Palpations: Abdomen is soft. There is no mass.     Tenderness: There is no abdominal tenderness. There is no right CVA tenderness, left CVA tenderness, guarding or rebound.     Hernia: No hernia is present.  Genitourinary:    Comments: Genital exam deferred with shared decision making Musculoskeletal:        General: No swelling, tenderness, deformity or signs of injury.     Right lower leg: No edema.     Left lower leg: No edema.  Lymphadenopathy:     Cervical: No cervical adenopathy.  Skin:    General: Skin is warm and dry.     Capillary Refill: Capillary refill takes less than 2 seconds.     Coloration: Skin is not jaundiced or pale.     Findings: No bruising, erythema, lesion or rash.  Neurological:     General: No focal deficit present.     Mental Status: He is alert and oriented to person, place, and time.     Cranial Nerves: No cranial nerve deficit.     Sensory: No sensory deficit.     Motor: No weakness.     Coordination: Coordination normal.     Gait: Gait normal.     Deep Tendon Reflexes: Reflexes normal.  Psychiatric:        Mood and Affect: Mood normal.        Behavior: Behavior normal.        Thought Content: Thought content normal.        Judgment: Judgment normal.     Results for orders placed or performed in visit on 10/02/19  CBC with Differential OUT  Result Value Ref Range   WBC 6.7 3.4 - 10.8 x10E3/uL   RBC 4.62 4.14 - 5.80 x10E6/uL   Hemoglobin 15.3 13.0 - 17.7 g/dL   Hematocrit 43.9 37.5 - 51.0 %   MCV 95 79 - 97 fL   MCH 33.1 (H) 26.6 - 33.0 pg   MCHC 34.9 31.5 - 35.7 g/dL   RDW 12.4 11.6 - 15.4 %   Platelets 150 150 - 450 x10E3/uL   Neutrophils 55 Not Estab. %   Lymphs 33 Not Estab. %   Monocytes 7 Not Estab. %   Eos 4 Not Estab. %   Basos 1 Not Estab. %   Neutrophils Absolute 3.8 1.4 - 7.0 x10E3/uL   Lymphocytes Absolute 2.2 0.7 - 3.1 x10E3/uL   Monocytes Absolute 0.5 0.1 - 0.9  x10E3/uL   EOS (ABSOLUTE) 0.3 0.0 - 0.4 x10E3/uL   Basophils Absolute 0.0 0.0 - 0.2 x10E3/uL   Immature Granulocytes 0 Not Estab. %   Immature Grans (Abs) 0.0 0.0 - 0.1 x10E3/uL  Comp Met (CMET)  Result Value Ref Range   Glucose 108 (H) 65 - 99 mg/dL   BUN 9 8 - 27 mg/dL   Creatinine, Ser 0.79 0.76 - 1.27 mg/dL   GFR  calc non Af Amer 82 >59 mL/min/1.73   GFR calc Af Amer 95 >59 mL/min/1.73   BUN/Creatinine Ratio 11 10 - 24   Sodium 144 134 - 144 mmol/L   Potassium 3.9 3.5 - 5.2 mmol/L   Chloride 105 96 - 106 mmol/L   CO2 23 20 - 29 mmol/L   Calcium 9.6 8.6 - 10.2 mg/dL   Total Protein 7.0 6.0 - 8.5 g/dL   Albumin 4.5 3.6 - 4.6 g/dL   Globulin, Total 2.5 1.5 - 4.5 g/dL   Albumin/Globulin Ratio 1.8 1.2 - 2.2   Bilirubin Total 0.5 0.0 - 1.2 mg/dL   Alkaline Phosphatase 96 39 - 117 IU/L   AST 19 0 - 40 IU/L   ALT 16 0 - 44 IU/L  Lipid Panel w/o Chol/HDL Ratio OUT  Result Value Ref Range   Cholesterol, Total 135 100 - 199 mg/dL   Triglycerides 64 0 - 149 mg/dL   HDL 70 >39 mg/dL   VLDL Cholesterol Cal 13 5 - 40 mg/dL   LDL Chol Calc (NIH) 52 0 - 99 mg/dL  Microalbumin, Urine Waived  Result Value Ref Range   Microalb, Ur Waived 10 0 - 19 mg/L   Creatinine, Urine Waived 10 10 - 300 mg/dL   Microalb/Creat Ratio 30-300 (H) <30 mg/g  TSH  Result Value Ref Range   TSH 3.680 0.450 - 4.500 uIU/mL  UA/M w/rflx Culture, Routine   Specimen: Urine   URINE  Result Value Ref Range   Specific Gravity, UA 1.010 1.005 - 1.030   pH, UA 6.0 5.0 - 7.5   Color, UA Yellow Yellow   Appearance Ur Clear Clear   Leukocytes,UA Negative Negative   Protein,UA Negative Negative/Trace   Glucose, UA Negative Negative   Ketones, UA Negative Negative   RBC, UA Negative Negative   Bilirubin, UA Negative Negative   Urobilinogen, Ur 0.2 0.2 - 1.0 mg/dL   Nitrite, UA Negative Negative      Assessment & Plan:   Problem List Items Addressed This Visit      Cardiovascular and Mediastinum   AAA  (abdominal aortic aneurysm) without rupture (HCC)    Stable. Continue to follow with cardiology. Continue to monitor. Call with any concerns.       Relevant Medications   metoprolol succinate (TOPROL-XL) 25 MG 24 hr tablet   lisinopril (ZESTRIL) 5 MG tablet   Other Relevant Orders   CBC with Differential OUT (Completed)   Comp Met (CMET) (Completed)   TSH (Completed)   UA/M w/rflx Culture, Routine (Completed)   CAD (coronary artery disease)    Stable. Continue to follow with cardiology. Continue to monitor. Call with any concerns.       Relevant Medications   metoprolol succinate (TOPROL-XL) 25 MG 24 hr tablet   lisinopril (ZESTRIL) 5 MG tablet   Other Relevant Orders   CBC with Differential OUT (Completed)   Comp Met (CMET) (Completed)   TSH (Completed)   UA/M w/rflx Culture, Routine (Completed)   HFrEF (heart failure with reduced ejection fraction) (HCC)    Stable. Continue to follow with cardiology. Continue to monitor. Call with any concerns.       Relevant Medications   metoprolol succinate (TOPROL-XL) 25 MG 24 hr tablet   lisinopril (ZESTRIL) 5 MG tablet   Other Relevant Orders   CBC with Differential OUT (Completed)   Comp Met (CMET) (Completed)   TSH (Completed)   UA/M w/rflx Culture, Routine (Completed)     Genitourinary  Benign hypertensive renal disease    Under good control on current regimen. Continue current regimen. Continue to monitor. Call with any concerns. Refills given. Labs checked today.       Relevant Orders   CBC with Differential OUT (Completed)   Comp Met (CMET) (Completed)   Microalbumin, Urine Waived (Completed)   TSH (Completed)   UA/M w/rflx Culture, Routine (Completed)     Other   Anxiety and depression    Under good control on current regimen. Continue current regimen. Continue to monitor. Call with any concerns.        Relevant Orders   CBC with Differential OUT (Completed)   Comp Met (CMET) (Completed)   TSH (Completed)    UA/M w/rflx Culture, Routine (Completed)   Malnutrition of moderate degree (HCC)    Weight stable. Continue to monitor. Call with any concerns. Continue to monitor.       Relevant Orders   CBC with Differential OUT (Completed)   Comp Met (CMET) (Completed)   TSH (Completed)   UA/M w/rflx Culture, Routine (Completed)   Mixed hyperlipidemia    Under good control on current regimen. Continue current regimen. Continue to monitor. Call with any concerns. Refills given. Labs drawn today.       Relevant Medications   metoprolol succinate (TOPROL-XL) 25 MG 24 hr tablet   lisinopril (ZESTRIL) 5 MG tablet   Other Relevant Orders   CBC with Differential OUT (Completed)   Comp Met (CMET) (Completed)   Lipid Panel w/o Chol/HDL Ratio OUT (Completed)   TSH (Completed)   UA/M w/rflx Culture, Routine (Completed)    Other Visit Diagnoses    Routine general medical examination at a health care facility    -  Primary   Vaccines up to date. Screening labs checked today. Continue diet and exercise. Call with any concerns.    Flu vaccine need       Flu shot given today.   Relevant Orders   Flu Vaccine QUAD High Dose(Fluad) (Completed)       Discussed aspirin prophylaxis for myocardial infarction prevention and decision was it was not indicated  LABORATORY TESTING:  Health maintenance labs ordered today as discussed above.   IMMUNIZATIONS:   - Tdap: Tetanus vaccination status reviewed: last tetanus booster within 10 years. - Influenza: Administered today - Pneumovax: Up to date - Prevnar: Up to date  SCREENING: - Colonoscopy: Not applicable  Discussed with patient purpose of the colonoscopy is to detect colon cancer at curable precancerous or early stages   - AAA Screening: Up to date   PATIENT COUNSELING:    Sexuality: Discussed sexually transmitted diseases, partner selection, use of condoms, avoidance of unintended pregnancy  and contraceptive alternatives.   Advised to avoid  cigarette smoking.  I discussed with the patient that most people either abstain from alcohol or drink within safe limits (<=14/week and <=4 drinks/occasion for males, <=7/weeks and <= 3 drinks/occasion for females) and that the risk for alcohol disorders and other health effects rises proportionally with the number of drinks per week and how often a drinker exceeds daily limits.  Discussed cessation/primary prevention of drug use and availability of treatment for abuse.   Diet: Encouraged to adjust caloric intake to maintain  or achieve ideal body weight, to reduce intake of dietary saturated fat and total fat, to limit sodium intake by avoiding high sodium foods and not adding table salt, and to maintain adequate dietary potassium and calcium preferably from fresh fruits, vegetables, and low-fat  dairy products.    stressed the importance of regular exercise  Injury prevention: Discussed safety belts, safety helmets, smoke detector, smoking near bedding or upholstery.   Dental health: Discussed importance of regular tooth brushing, flossing, and dental visits.   Follow up plan: NEXT PREVENTATIVE PHYSICAL DUE IN 1 YEAR. Return in about 6 months (around 03/31/2020).

## 2019-10-03 LAB — COMPREHENSIVE METABOLIC PANEL
ALT: 16 IU/L (ref 0–44)
AST: 19 IU/L (ref 0–40)
Albumin/Globulin Ratio: 1.8 (ref 1.2–2.2)
Albumin: 4.5 g/dL (ref 3.6–4.6)
Alkaline Phosphatase: 96 IU/L (ref 39–117)
BUN/Creatinine Ratio: 11 (ref 10–24)
BUN: 9 mg/dL (ref 8–27)
Bilirubin Total: 0.5 mg/dL (ref 0.0–1.2)
CO2: 23 mmol/L (ref 20–29)
Calcium: 9.6 mg/dL (ref 8.6–10.2)
Chloride: 105 mmol/L (ref 96–106)
Creatinine, Ser: 0.79 mg/dL (ref 0.76–1.27)
GFR calc Af Amer: 95 mL/min/{1.73_m2} (ref >59)
GFR calc non Af Amer: 82 mL/min/{1.73_m2} (ref >59)
Globulin, Total: 2.5 g/dL (ref 1.5–4.5)
Glucose: 108 mg/dL — ABNORMAL HIGH (ref 65–99)
Potassium: 3.9 mmol/L (ref 3.5–5.2)
Sodium: 144 mmol/L (ref 134–144)
Total Protein: 7 g/dL (ref 6.0–8.5)

## 2019-10-03 LAB — CBC WITH DIFFERENTIAL/PLATELET
Basophils Absolute: 0 10*3/uL (ref 0.0–0.2)
Basos: 1 %
EOS (ABSOLUTE): 0.3 10*3/uL (ref 0.0–0.4)
Eos: 4 %
Hematocrit: 43.9 % (ref 37.5–51.0)
Hemoglobin: 15.3 g/dL (ref 13.0–17.7)
Immature Grans (Abs): 0 10*3/uL (ref 0.0–0.1)
Immature Granulocytes: 0 %
Lymphocytes Absolute: 2.2 10*3/uL (ref 0.7–3.1)
Lymphs: 33 %
MCH: 33.1 pg — ABNORMAL HIGH (ref 26.6–33.0)
MCHC: 34.9 g/dL (ref 31.5–35.7)
MCV: 95 fL (ref 79–97)
Monocytes Absolute: 0.5 10*3/uL (ref 0.1–0.9)
Monocytes: 7 %
Neutrophils Absolute: 3.8 10*3/uL (ref 1.4–7.0)
Neutrophils: 55 %
Platelets: 150 10*3/uL (ref 150–450)
RBC: 4.62 x10E6/uL (ref 4.14–5.80)
RDW: 12.4 % (ref 11.6–15.4)
WBC: 6.7 10*3/uL (ref 3.4–10.8)

## 2019-10-03 LAB — LIPID PANEL W/O CHOL/HDL RATIO
Cholesterol, Total: 135 mg/dL (ref 100–199)
HDL: 70 mg/dL (ref >39)
LDL Chol Calc (NIH): 52 mg/dL (ref 0–99)
Triglycerides: 64 mg/dL (ref 0–149)
VLDL Cholesterol Cal: 13 mg/dL (ref 5–40)

## 2019-10-03 LAB — TSH: TSH: 3.68 u[IU]/mL (ref 0.450–4.500)

## 2019-10-05 DIAGNOSIS — E782 Mixed hyperlipidemia: Secondary | ICD-10-CM | POA: Insufficient documentation

## 2019-10-05 NOTE — Assessment & Plan Note (Signed)
Under good control on current regimen. Continue current regimen. Continue to monitor. Call with any concerns. Refills given. Labs checked today.  

## 2019-10-05 NOTE — Assessment & Plan Note (Signed)
Stable. Continue to follow with cardiology. Continue to monitor. Call with any concerns.

## 2019-10-05 NOTE — Assessment & Plan Note (Signed)
Weight stable. Continue to monitor. Call with any concerns. Continue to monitor.

## 2019-10-05 NOTE — Assessment & Plan Note (Signed)
Under good control on current regimen. Continue current regimen. Continue to monitor. Call with any concerns. Refills given. Labs drawn today.   

## 2019-10-05 NOTE — Assessment & Plan Note (Signed)
Under good control on current regimen. Continue current regimen. Continue to monitor. Call with any concerns.   

## 2019-10-06 ENCOUNTER — Encounter: Payer: Self-pay | Admitting: Family Medicine

## 2019-10-14 ENCOUNTER — Other Ambulatory Visit: Payer: Self-pay

## 2019-11-03 ENCOUNTER — Other Ambulatory Visit: Payer: Self-pay | Admitting: Cardiology

## 2019-11-24 ENCOUNTER — Other Ambulatory Visit: Payer: Medicare HMO

## 2019-11-24 ENCOUNTER — Ambulatory Visit: Payer: Medicare HMO | Attending: Internal Medicine

## 2019-11-24 DIAGNOSIS — K219 Gastro-esophageal reflux disease without esophagitis: Secondary | ICD-10-CM | POA: Diagnosis not present

## 2019-11-24 DIAGNOSIS — I509 Heart failure, unspecified: Secondary | ICD-10-CM | POA: Diagnosis not present

## 2019-11-24 DIAGNOSIS — Z20822 Contact with and (suspected) exposure to covid-19: Secondary | ICD-10-CM | POA: Diagnosis not present

## 2019-11-24 DIAGNOSIS — E785 Hyperlipidemia, unspecified: Secondary | ICD-10-CM | POA: Diagnosis not present

## 2019-11-24 DIAGNOSIS — I2510 Atherosclerotic heart disease of native coronary artery without angina pectoris: Secondary | ICD-10-CM | POA: Diagnosis not present

## 2019-11-24 DIAGNOSIS — I110 Hypertensive heart disease with heart failure: Secondary | ICD-10-CM | POA: Diagnosis not present

## 2019-11-24 DIAGNOSIS — Z79899 Other long term (current) drug therapy: Secondary | ICD-10-CM | POA: Diagnosis not present

## 2019-11-24 DIAGNOSIS — Z720 Tobacco use: Secondary | ICD-10-CM | POA: Diagnosis not present

## 2019-11-24 DIAGNOSIS — R69 Illness, unspecified: Secondary | ICD-10-CM | POA: Diagnosis not present

## 2019-11-24 DIAGNOSIS — G8929 Other chronic pain: Secondary | ICD-10-CM | POA: Diagnosis not present

## 2019-11-24 DIAGNOSIS — F1120 Opioid dependence, uncomplicated: Secondary | ICD-10-CM | POA: Diagnosis not present

## 2019-11-24 DIAGNOSIS — N529 Male erectile dysfunction, unspecified: Secondary | ICD-10-CM | POA: Diagnosis not present

## 2019-11-24 DIAGNOSIS — G894 Chronic pain syndrome: Secondary | ICD-10-CM | POA: Diagnosis not present

## 2019-11-24 DIAGNOSIS — M545 Low back pain: Secondary | ICD-10-CM | POA: Diagnosis not present

## 2019-11-24 DIAGNOSIS — I252 Old myocardial infarction: Secondary | ICD-10-CM | POA: Diagnosis not present

## 2019-11-25 LAB — NOVEL CORONAVIRUS, NAA: SARS-CoV-2, NAA: NOT DETECTED

## 2019-11-26 ENCOUNTER — Telehealth: Payer: Self-pay | Admitting: Family Medicine

## 2019-11-26 NOTE — Telephone Encounter (Signed)
Negative COVID results given. Patient results "NOT Detected." Caller expressed understanding. ° °

## 2019-11-30 ENCOUNTER — Other Ambulatory Visit: Payer: Self-pay | Admitting: Cardiology

## 2019-12-05 ENCOUNTER — Ambulatory Visit: Payer: Medicare HMO | Attending: Internal Medicine

## 2019-12-05 DIAGNOSIS — Z23 Encounter for immunization: Secondary | ICD-10-CM | POA: Insufficient documentation

## 2019-12-05 NOTE — Progress Notes (Signed)
   Covid-19 Vaccination Clinic  Name:  Brian [name]    MRN: [id number] DOB: [birth year]  12/05/2019  [name] was observed post Covid-19 immunization for 15 minutes without incidence. He was provided with Vaccine Information Sheet and instruction to access the V-Safe system.   [name] was instructed to call 911 with any severe reactions post vaccine: Marland Kitchen Difficulty breathing  . Swelling of your face and throat  . A fast heartbeat  . A bad rash all over your body  . Dizziness and weakness    Immunizations Administered    Name Date Dose VIS Date Route   Pfizer COVID-19 Vaccine 12/05/2019  2:41 PM 0.3 mL 10/24/2019 Intramuscular   Manufacturer: [place]   Lot: [id number]   [place]: [id number]

## 2019-12-26 ENCOUNTER — Ambulatory Visit: Payer: Medicare HMO | Attending: Internal Medicine

## 2019-12-26 DIAGNOSIS — Z23 Encounter for immunization: Secondary | ICD-10-CM | POA: Insufficient documentation

## 2019-12-26 NOTE — Progress Notes (Signed)
   Covid-19 Vaccination Clinic  Name:  BLAN SHOCKLEY    MRN: [id number] DOB: 1933/03/06  12/26/2019  Mr. Deveau was observed post Covid-19 immunization for 15 minutes without incidence. He was provided with Vaccine Information Sheet and instruction to access the V-Safe system.   Mr. Brymer was instructed to call 911 with any severe reactions post vaccine: Marland Kitchen Difficulty breathing  . Swelling of your face and throat  . A fast heartbeat  . A bad rash all over your body  . Dizziness and weakness    Immunizations Administered    Name Date Dose VIS Date Route   Pfizer COVID-19 Vaccine 12/26/2019  2:16 PM 0.3 mL 10/24/2019 Intramuscular   Manufacturer: [place]   Lot: [id number]   [place]: [id number]

## 2020-01-12 DIAGNOSIS — R69 Illness, unspecified: Secondary | ICD-10-CM | POA: Diagnosis not present

## 2020-01-12 DIAGNOSIS — Z79899 Other long term (current) drug therapy: Secondary | ICD-10-CM | POA: Diagnosis not present

## 2020-01-12 DIAGNOSIS — F1120 Opioid dependence, uncomplicated: Secondary | ICD-10-CM | POA: Diagnosis not present

## 2020-01-12 DIAGNOSIS — G894 Chronic pain syndrome: Secondary | ICD-10-CM | POA: Diagnosis not present

## 2020-01-12 DIAGNOSIS — M545 Low back pain: Secondary | ICD-10-CM | POA: Diagnosis not present

## 2020-02-03 ENCOUNTER — Other Ambulatory Visit: Payer: Self-pay | Admitting: Cardiology

## 2020-03-15 ENCOUNTER — Encounter: Payer: Self-pay | Admitting: Family Medicine

## 2020-04-05 ENCOUNTER — Ambulatory Visit: Payer: Medicare HMO | Admitting: Family Medicine

## 2020-04-05 DIAGNOSIS — Z79899 Other long term (current) drug therapy: Secondary | ICD-10-CM | POA: Diagnosis not present

## 2020-04-05 DIAGNOSIS — M545 Low back pain: Secondary | ICD-10-CM | POA: Diagnosis not present

## 2020-04-05 DIAGNOSIS — G894 Chronic pain syndrome: Secondary | ICD-10-CM | POA: Diagnosis not present

## 2020-04-05 DIAGNOSIS — R69 Illness, unspecified: Secondary | ICD-10-CM | POA: Diagnosis not present

## 2020-04-05 DIAGNOSIS — F1120 Opioid dependence, uncomplicated: Secondary | ICD-10-CM | POA: Diagnosis not present

## 2020-04-06 ENCOUNTER — Other Ambulatory Visit: Payer: Self-pay | Admitting: Family Medicine

## 2020-04-09 ENCOUNTER — Ambulatory Visit: Payer: Medicare HMO | Admitting: Family Medicine

## 2020-04-11 ENCOUNTER — Other Ambulatory Visit: Payer: Self-pay | Admitting: Family Medicine

## 2020-04-11 NOTE — Telephone Encounter (Signed)
courtesy RF- Pt needs to make an appt for further refills Requested Prescriptions  Pending Prescriptions Disp Refills  . lisinopril (ZESTRIL) 5 MG tablet [Pharmacy Med Name: Lisinopril 5 MG Oral Tablet] 30 tablet 0    Sig: Take 1 tablet by mouth once daily     Cardiovascular:  ACE Inhibitors Failed - 04/11/2020 10:58 AM      Failed - Cr in normal range and within 180 days    Creatinine  Date Value Ref Range Status  04/15/2012 0.80 0.60 - 1.30 mg/dL Final   Creatinine, Ser  Date Value Ref Range Status  10/02/2019 0.79 0.76 - 1.27 mg/dL Final         Failed - K in normal range and within 180 days    Potassium  Date Value Ref Range Status  10/02/2019 3.9 3.5 - 5.2 mmol/L Final  04/15/2012 3.6 3.5 - 5.1 mmol/L Final         Failed - Last BP in normal range    BP Readings from Last 1 Encounters:  10/02/19 (!) 157/91         Failed - Valid encounter within last 6 months    Recent Outpatient Visits          6 months ago Routine general medical examination at a health care facility   [place], [place], DO   1 year ago Benign hypertensive renal disease   [place], Megan P, DO   1 year ago Benign hypertensive renal disease   [facility] Valerie Roys, DO   2 years ago Medicare annual wellness visit, subsequent   [employer], [place], DO   2 years ago Essential hypertension   [place], [place], DO      Future Appointments            In 1 week Wynetta Emery, Barb Merino, DO [facility]   In 2 months  [facility]   In 4 months Patwardhan, Reynold Bowen, MD [facility]           Passed - Patient is not pregnant

## 2020-04-19 ENCOUNTER — Encounter: Payer: Self-pay | Admitting: Family Medicine

## 2020-04-19 ENCOUNTER — Ambulatory Visit (INDEPENDENT_AMBULATORY_CARE_PROVIDER_SITE_OTHER): Payer: Medicare HMO | Admitting: Family Medicine

## 2020-04-19 ENCOUNTER — Other Ambulatory Visit: Payer: Self-pay

## 2020-04-19 VITALS — BP 107/68 | HR 74 | Temp 97.9°F | Wt 132.0 lb

## 2020-04-19 DIAGNOSIS — I714 Abdominal aortic aneurysm, without rupture, unspecified: Secondary | ICD-10-CM

## 2020-04-19 DIAGNOSIS — I2510 Atherosclerotic heart disease of native coronary artery without angina pectoris: Secondary | ICD-10-CM | POA: Diagnosis not present

## 2020-04-19 DIAGNOSIS — F32A Depression, unspecified: Secondary | ICD-10-CM

## 2020-04-19 DIAGNOSIS — I129 Hypertensive chronic kidney disease with stage 1 through stage 4 chronic kidney disease, or unspecified chronic kidney disease: Secondary | ICD-10-CM

## 2020-04-19 DIAGNOSIS — I5020 Unspecified systolic (congestive) heart failure: Secondary | ICD-10-CM

## 2020-04-19 DIAGNOSIS — E440 Moderate protein-calorie malnutrition: Secondary | ICD-10-CM

## 2020-04-19 DIAGNOSIS — F419 Anxiety disorder, unspecified: Secondary | ICD-10-CM | POA: Diagnosis not present

## 2020-04-19 DIAGNOSIS — R69 Illness, unspecified: Secondary | ICD-10-CM | POA: Diagnosis not present

## 2020-04-19 DIAGNOSIS — F329 Major depressive disorder, single episode, unspecified: Secondary | ICD-10-CM

## 2020-04-19 DIAGNOSIS — E782 Mixed hyperlipidemia: Secondary | ICD-10-CM | POA: Diagnosis not present

## 2020-04-19 LAB — MICROALBUMIN, URINE WAIVED
Creatinine, Urine Waived: 50 mg/dL (ref 10–300)
Microalb, Ur Waived: 10 mg/L (ref 0–19)

## 2020-04-19 MED ORDER — LISINOPRIL 5 MG PO TABS: 5.0000 mg | ORAL_TABLET | Freq: Every day | ORAL | 1 refills | Status: DC

## 2020-04-19 MED ORDER — ATORVASTATIN CALCIUM 80 MG PO TABS: ORAL_TABLET | ORAL | 1 refills | Status: DC

## 2020-04-19 MED ORDER — CLOPIDOGREL BISULFATE 75 MG PO TABS: 75.0000 mg | ORAL_TABLET | Freq: Every day | ORAL | 3 refills | Status: DC

## 2020-04-19 MED ORDER — METOPROLOL SUCCINATE ER 25 MG PO TB24: 12.5000 mg | ORAL_TABLET | Freq: Every day | ORAL | 1 refills | Status: DC

## 2020-04-19 NOTE — Assessment & Plan Note (Signed)
Stable. Continue to follow with specialist. Continue to monitor.

## 2020-04-19 NOTE — Assessment & Plan Note (Signed)
Has lost weight over the last few months. Will continue diet and exercise. Continue to monitor.

## 2020-04-19 NOTE — Assessment & Plan Note (Signed)
Continue to follow with vascular. Continue to monitor. Call with any concerns.

## 2020-04-19 NOTE — Assessment & Plan Note (Signed)
Will keep BP and cholesterol under good control. Continue to monitor. Call with any concerns.  

## 2020-04-19 NOTE — Assessment & Plan Note (Signed)
Euvolemic today. Continue to follow with cardiology. Continue to monitor. Call with any concerns.

## 2020-04-19 NOTE — Assessment & Plan Note (Signed)
Under good control on current regimen. Continue current regimen. Continue to monitor. Call with any concerns. Refills given. Labs drawn today.   

## 2020-04-19 NOTE — Progress Notes (Signed)
BP 107/68   Pulse 74   Temp 97.9 F (36.6 C) (Oral)   Wt 132 lb (59.9 kg)   SpO2 95%   BMI 20.67 kg/m    Subjective:    Patient ID: Brian [name], male    DOB: 12/07/1932, 87 y.o.   MRN: [id number]  HPI: Brian [name] is a 86 y.o. male  Chief Complaint  Patient presents with  . Hypertension  . Hyperlipidemia   HYPERTENSION / HYPERLIPIDEMIA Satisfied with current treatment? yes Duration of hypertension: chronic BP monitoring frequency: not checking BP medication side effects: no Past BP meds: metoprolol, lisinopril Duration of hyperlipidemia: chronic Cholesterol medication side effects: no Cholesterol supplements: none Past cholesterol medications: atorvastatin Medication compliance: excellent compliance Aspirin: no Recent stressors: no Recurrent headaches: no Visual changes: no Palpitations: no Dyspnea: no Chest pain: no Lower extremity edema: no Dizzy/lightheaded: no  Relevant past medical, surgical, family and social history reviewed and updated as indicated. Interim medical history since our last visit reviewed. Allergies and medications reviewed and updated.  Review of Systems  Constitutional: Negative.   HENT: Negative.   Respiratory: Negative.   Cardiovascular: Negative.   Gastrointestinal: Negative.   Psychiatric/Behavioral: Negative.     Per HPI unless specifically indicated above     Objective:    BP 107/68   Pulse 74   Temp 97.9 F (36.6 C) (Oral)   Wt 132 lb (59.9 kg)   SpO2 95%   BMI 20.67 kg/m   Wt Readings from Last 3 Encounters:  04/19/20 132 lb (59.9 kg)  10/02/19 139 lb (63 kg)  08/21/19 139 lb (63 kg)    Physical Exam Vitals and nursing note reviewed.  Constitutional:      General: He is not in acute distress.    Appearance: Normal appearance. He is not ill-appearing, toxic-appearing or diaphoretic.  HENT:     Head: Normocephalic and atraumatic.     Right Ear: External ear normal.     Left Ear: External ear normal.       Nose: Nose normal.     Mouth/Throat:     Mouth: Mucous membranes are moist.     Pharynx: Oropharynx is clear.  Eyes:     General: No scleral icterus.       Right eye: No discharge.        Left eye: No discharge.     Extraocular Movements: Extraocular movements intact.     Conjunctiva/sclera: Conjunctivae normal.     Pupils: Pupils are equal, round, and reactive to light.  Cardiovascular:     Rate and Rhythm: Normal rate and regular rhythm.     Pulses: Normal pulses.     Heart sounds: Normal heart sounds. No murmur. No friction rub. No gallop.   Pulmonary:     Effort: Pulmonary effort is normal. No respiratory distress.     Breath sounds: Normal breath sounds. No stridor. No wheezing, rhonchi or rales.  Chest:     Chest wall: No tenderness.  Musculoskeletal:        General: Normal range of motion.     Cervical back: Normal range of motion and neck supple.  Skin:    General: Skin is warm and dry.     Capillary Refill: Capillary refill takes less than 2 seconds.     Coloration: Skin is not jaundiced or pale.     Findings: No bruising, erythema, lesion or rash.  Neurological:     General: No focal deficit present.  Mental Status: He is alert and oriented to person, place, and time. Mental status is at baseline.  Psychiatric:        Mood and Affect: Mood normal.        Behavior: Behavior normal.        Thought Content: Thought content normal.        Judgment: Judgment normal.     Results for orders placed or performed in visit on 11/24/19  Novel Coronavirus, NAA ([facility])   Specimen: Nasopharyngeal(NP) swabs in vial transport medium   NASOPHARYNGE  TESTING  Result Value Ref Range   SARS-CoV-2, NAA Not Detected Not Detected      Assessment & Plan:   Problem List Items Addressed This Visit      Cardiovascular and Mediastinum   AAA (abdominal aortic aneurysm) without rupture ([place])    Continue to follow with vascular. Continue to monitor. Call with any concerns.        Relevant Medications   atorvastatin (LIPITOR) 80 MG tablet   lisinopril (ZESTRIL) 5 MG tablet   metoprolol succinate (TOPROL-XL) 25 MG 24 hr tablet   CAD (coronary artery disease)    Will keep BP and cholesterol under good control. Continue to monitor. Call with any concerns.       Relevant Medications   atorvastatin (LIPITOR) 80 MG tablet   lisinopril (ZESTRIL) 5 MG tablet   metoprolol succinate (TOPROL-XL) 25 MG 24 hr tablet   Other Relevant Orders   CBC with Differential/Platelet   Comprehensive metabolic panel   HFrEF (heart failure with reduced ejection fraction) ([place])    Euvolemic today. Continue to follow with cardiology. Continue to monitor. Call with any concerns.       Relevant Medications   atorvastatin (LIPITOR) 80 MG tablet   lisinopril (ZESTRIL) 5 MG tablet   metoprolol succinate (TOPROL-XL) 25 MG 24 hr tablet     Genitourinary   Benign hypertensive renal disease - Primary    Under good control on current regimen. Continue current regimen. Continue to monitor. Call with any concerns. Refills given. Labs drawn today.       Relevant Orders   CBC with Differential/Platelet   Comprehensive metabolic panel   Microalbumin, Urine Waived     Other   Anxiety and depression    Stable. Continue to follow with specialist. Continue to monitor.       Relevant Orders   CBC with Differential/Platelet   Comprehensive metabolic panel   Malnutrition of moderate degree (HCC)    Has lost weight over the last few months. Will continue diet and exercise. Continue to monitor.       Mixed hyperlipidemia    Under good control on current regimen. Continue current regimen. Continue to monitor. Call with any concerns. Refills given. Labs drawn today.       Relevant Medications   atorvastatin (LIPITOR) 80 MG tablet   lisinopril (ZESTRIL) 5 MG tablet   metoprolol succinate (TOPROL-XL) 25 MG 24 hr tablet   Other Relevant Orders   CBC with Differential/Platelet    Comprehensive metabolic panel   Lipid Panel w/o Chol/HDL Ratio       Follow up plan: Return in about 6 months (around 10/19/2020), or Physical/wellness.

## 2020-04-20 ENCOUNTER — Encounter: Payer: Self-pay | Admitting: Family Medicine

## 2020-04-20 LAB — COMPREHENSIVE METABOLIC PANEL
ALT: 12 IU/L (ref 0–44)
AST: 17 IU/L (ref 0–40)
Albumin/Globulin Ratio: 1.8 (ref 1.2–2.2)
Albumin: 4.1 g/dL (ref 3.6–4.6)
Alkaline Phosphatase: 71 IU/L (ref 48–121)
BUN/Creatinine Ratio: 9 — ABNORMAL LOW (ref 10–24)
BUN: 7 mg/dL — ABNORMAL LOW (ref 8–27)
Bilirubin Total: 0.4 mg/dL (ref 0.0–1.2)
CO2: 24 mmol/L (ref 20–29)
Calcium: 9.1 mg/dL (ref 8.6–10.2)
Chloride: 108 mmol/L — ABNORMAL HIGH (ref 96–106)
Creatinine, Ser: 0.81 mg/dL (ref 0.76–1.27)
GFR calc Af Amer: 94 mL/min/{1.73_m2} (ref >59)
GFR calc non Af Amer: 82 mL/min/{1.73_m2} (ref >59)
Globulin, Total: 2.3 g/dL (ref 1.5–4.5)
Glucose: 118 mg/dL — ABNORMAL HIGH (ref 65–99)
Potassium: 4.5 mmol/L (ref 3.5–5.2)
Sodium: 143 mmol/L (ref 134–144)
Total Protein: 6.4 g/dL (ref 6.0–8.5)

## 2020-04-20 LAB — LIPID PANEL W/O CHOL/HDL RATIO
Cholesterol, Total: 135 mg/dL (ref 100–199)
HDL: 68 mg/dL (ref >39)
LDL Chol Calc (NIH): 50 mg/dL (ref 0–99)
Triglycerides: 92 mg/dL (ref 0–149)
VLDL Cholesterol Cal: 17 mg/dL (ref 5–40)

## 2020-04-20 LAB — CBC WITH DIFFERENTIAL/PLATELET
Basophils Absolute: 0 10*3/uL (ref 0.0–0.2)
Basos: 1 %
EOS (ABSOLUTE): 0.2 10*3/uL (ref 0.0–0.4)
Eos: 3 %
Hematocrit: 39.7 % (ref 37.5–51.0)
Hemoglobin: 13 g/dL (ref 13.0–17.7)
Immature Grans (Abs): 0 10*3/uL (ref 0.0–0.1)
Immature Granulocytes: 0 %
Lymphocytes Absolute: 1.7 10*3/uL (ref 0.7–3.1)
Lymphs: 28 %
MCH: 32 pg (ref 26.6–33.0)
MCHC: 32.7 g/dL (ref 31.5–35.7)
MCV: 98 fL — ABNORMAL HIGH (ref 79–97)
Monocytes Absolute: 0.4 10*3/uL (ref 0.1–0.9)
Monocytes: 6 %
Neutrophils Absolute: 3.8 10*3/uL (ref 1.4–7.0)
Neutrophils: 62 %
Platelets: 165 10*3/uL (ref 150–450)
RBC: 4.06 x10E6/uL — ABNORMAL LOW (ref 4.14–5.80)
RDW: 12.5 % (ref 11.6–15.4)
WBC: 6.2 10*3/uL (ref 3.4–10.8)

## 2020-05-05 ENCOUNTER — Other Ambulatory Visit: Payer: Self-pay | Admitting: Family Medicine

## 2020-06-10 ENCOUNTER — Telehealth: Payer: Self-pay | Admitting: Family Medicine

## 2020-06-10 NOTE — Telephone Encounter (Signed)
Copied from [place] [id number]. Topic: Medicare AWV >> Jun 10, 2020 10:16 AM Cher Nakai R wrote: Reason for CRM:  [facility] for patient - Need to give new appt information and confrm number to call -srs  He was scheduled for 06/23/2020 for AWVS but we had to rescheduled to 06/25/2020 at 9:00am.

## 2020-06-23 ENCOUNTER — Ambulatory Visit: Payer: Medicare HMO

## 2020-06-25 ENCOUNTER — Ambulatory Visit (INDEPENDENT_AMBULATORY_CARE_PROVIDER_SITE_OTHER): Payer: Medicare HMO

## 2020-06-25 VITALS — BP 140/80 | Ht 68.0 in | Wt 132.0 lb

## 2020-06-25 DIAGNOSIS — Z0000 Encounter for general adult medical examination without abnormal findings: Secondary | ICD-10-CM

## 2020-06-25 NOTE — Patient Instructions (Signed)
Brian [name] , Thank you for taking time to come for your Medicare Wellness Visit. I appreciate your ongoing commitment to your health goals. Please review the following plan we discussed and let me know if I can assist you in the future.   Screening recommendations/referrals: Colonoscopy: not required Recommended yearly ophthalmology/optometry visit for glaucoma screening and checkup Recommended yearly dental visit for hygiene and checkup  Vaccinations: Influenza vaccine: due Pneumococcal vaccine: completed 12/25/2017 Tdap vaccine: due Shingles vaccine: discussed   Covid-19: 12/26/2019, 12/05/2019  Advanced directives: Please bring a copy of your POA (Power of Attorney) and/or Living Will to your next appointment.   Conditions/risks identified: smoking  Next appointment: Follow up in one year for your annual wellness visit.   Preventive Care 23 Years and Older, Male Preventive care refers to lifestyle choices and visits with your health care provider that can promote health and wellness. What does preventive care include?  A yearly physical exam. This is also called an annual well check.  Dental exams once or twice a year.  Routine eye exams. Ask your health care provider how often you should have your eyes checked.  Personal lifestyle choices, including:  Daily care of your teeth and gums.  Regular physical activity.  Eating a healthy diet.  Avoiding tobacco and drug use.  Limiting alcohol use.  Practicing safe sex.  Taking low doses of aspirin every day.  Taking vitamin and mineral supplements as recommended by your health care provider. What happens during an annual well check? The services and screenings done by your health care provider during your annual well check will depend on your age, overall health, lifestyle risk factors, and family history of disease. Counseling  Your health care provider may ask you questions about your:  Alcohol use.  Tobacco  use.  Drug use.  Emotional well-being.  Home and relationship well-being.  Sexual activity.  Eating habits.  History of falls.  Memory and ability to understand (cognition).  Work and work Statistician. Screening  You may have the following tests or measurements:  Height, weight, and BMI.  Blood pressure.  Lipid and cholesterol levels. These may be checked every 5 years, or more frequently if you are over 65 years old.  Skin check.  Lung cancer screening. You may have this screening every year starting at age 93 if you have a 30-pack-year history of smoking and currently smoke or have quit within the past 15 years.  Fecal occult blood test (FOBT) of the stool. You may have this test every year starting at age 53.  Flexible sigmoidoscopy or colonoscopy. You may have a sigmoidoscopy every 5 years or a colonoscopy every 10 years starting at age 50.  Prostate cancer screening. Recommendations will vary depending on your family history and other risks.  Hepatitis C blood test.  Hepatitis B blood test.  Sexually transmitted disease (STD) testing.  Diabetes screening. This is done by checking your blood sugar (glucose) after you have not eaten for a while (fasting). You may have this done every 1-3 years.  Abdominal aortic aneurysm (AAA) screening. You may need this if you are a current or former smoker.  Osteoporosis. You may be screened starting at age 49 if you are at high risk. Talk with your health care provider about your test results, treatment options, and if necessary, the need for more tests. Vaccines  Your health care provider may recommend certain vaccines, such as:  Influenza vaccine. This is recommended every year.  Tetanus, diphtheria, and  acellular pertussis (Tdap, Td) vaccine. You may need a Td booster every 10 years.  Zoster vaccine. You may need this after age 53.  Pneumococcal 13-valent conjugate (PCV13) vaccine. One dose is recommended after age  72.  Pneumococcal polysaccharide (PPSV23) vaccine. One dose is recommended after age 47. Talk to your health care provider about which screenings and vaccines you need and how often you need them. This information is not intended to replace advice given to you by your health care provider. Make sure you discuss any questions you have with your health care provider. Document Released: 11/26/2015 Document Revised: 07/19/2016 Document Reviewed: 08/31/2015 Elsevier Interactive Patient Education  2017 [place] Prevention in the Home Falls can cause injuries. They can happen to people of all ages. There are many things you can do to make your home safe and to help prevent falls. What can I do on the outside of my home?  Regularly fix the edges of walkways and driveways and fix any cracks.  Remove anything that might make you trip as you walk through a door, such as a raised step or threshold.  Trim any bushes or trees on the path to your home.  Use bright outdoor lighting.  Clear any walking paths of anything that might make someone trip, such as rocks or tools.  Regularly check to see if handrails are loose or broken. Make sure that both sides of any steps have handrails.  Any raised decks and porches should have guardrails on the edges.  Have any leaves, snow, or ice cleared regularly.  Use sand or salt on walking paths during winter.  Clean up any spills in your garage right away. This includes oil or grease spills. What can I do in the bathroom?  Use night lights.  Install grab bars by the toilet and in the tub and shower. Do not use towel bars as grab bars.  Use non-skid mats or decals in the tub or shower.  If you need to sit down in the shower, use a plastic, non-slip stool.  Keep the floor dry. Clean up any water that spills on the floor as soon as it happens.  Remove soap buildup in the tub or shower regularly.  Attach bath mats securely with double-sided  non-slip rug tape.  Do not have throw rugs and other things on the floor that can make you trip. What can I do in the bedroom?  Use night lights.  Make sure that you have a light by your bed that is easy to reach.  Do not use any sheets or blankets that are too big for your bed. They should not hang down onto the floor.  Have a firm chair that has side arms. You can use this for support while you get dressed.  Do not have throw rugs and other things on the floor that can make you trip. What can I do in the kitchen?  Clean up any spills right away.  Avoid walking on wet floors.  Keep items that you use a lot in easy-to-reach places.  If you need to reach something above you, use a strong step stool that has a grab bar.  Keep electrical cords out of the way.  Do not use floor polish or wax that makes floors slippery. If you must use wax, use non-skid floor wax.  Do not have throw rugs and other things on the floor that can make you trip. What can I do with my stairs?  Do not leave any items on the stairs.  Make sure that there are handrails on both sides of the stairs and use them. Fix handrails that are broken or loose. Make sure that handrails are as long as the stairways.  Check any carpeting to make sure that it is firmly attached to the stairs. Fix any carpet that is loose or worn.  Avoid having throw rugs at the top or bottom of the stairs. If you do have throw rugs, attach them to the floor with carpet tape.  Make sure that you have a light switch at the top of the stairs and the bottom of the stairs. If you do not have them, ask someone to add them for you. What else can I do to help prevent falls?  Wear shoes that:  Do not have high heels.  Have rubber bottoms.  Are comfortable and fit you well.  Are closed at the toe. Do not wear sandals.  If you use a stepladder:  Make sure that it is fully opened. Do not climb a closed stepladder.  Make sure that both  sides of the stepladder are locked into place.  Ask someone to hold it for you, if possible.  Clearly mark and make sure that you can see:  Any grab bars or handrails.  First and last steps.  Where the edge of each step is.  Use tools that help you move around (mobility aids) if they are needed. These include:  Canes.  Walkers.  Scooters.  Crutches.  Turn on the lights when you go into a dark area. Replace any light bulbs as soon as they burn out.  Set up your furniture so you have a clear path. Avoid moving your furniture around.  If any of your floors are uneven, fix them.  If there are any pets around you, be aware of where they are.  Review your medicines with your doctor. Some medicines can make you feel dizzy. This can increase your chance of falling. Ask your doctor what other things that you can do to help prevent falls. This information is not intended to replace advice given to you by your health care provider. Make sure you discuss any questions you have with your health care provider. Document Released: 08/26/2009 Document Revised: 04/06/2016 Document Reviewed: 12/04/2014 Elsevier Interactive Patient Education  2017 [employer].

## 2020-06-25 NOTE — Progress Notes (Signed)
I connected with Brian [name] today by telephone and verified that I am speaking with the correct person using two identifiers. Location patient: home Location provider: work Persons participating in the virtual visit: Commodore, [place] LPN.   I discussed the limitations, risks, security and privacy concerns of performing an evaluation and management service by telephone and the availability of in person appointments. I also discussed with the patient that there may be a patient responsible charge related to this service. The patient expressed understanding and verbally consented to this telephonic visit.    Interactive audio and video telecommunications were attempted between this provider and patient, however failed, due to patient having technical difficulties OR patient did not have access to video capability.  We continued and completed visit with audio only.    Vital signs may be patient reported or missing.   Subjective:   Brian [name] is a 86 y.o. male who presents for Medicare Annual/Subsequent preventive examination.  Review of Systems     Cardiac Risk Factors include: advanced age (>56men, >12 women);dyslipidemia;hypertension;male gender;sedentary lifestyle     Objective:    Today's Vitals   06/25/20 0856  BP: 140/80  Weight: 132 lb (59.9 kg)  Height: 5\' 8"  (1.727 m)   Body mass index is 20.07 kg/m.  Advanced Directives 06/25/2020 06/23/2019 06/19/2018 12/18/2016 10/29/2016 10/28/2016 06/05/2015  Does Patient Have a Medical Advance Directive? Yes Yes Yes Yes Yes Yes Yes  Type of Paramedic of [place];Living will Living will;Healthcare Power of Attorney Living will;Healthcare Power of Attorney Living will;Healthcare Power of [place];Living will [place];Living will -  Does patient want to make changes to medical advance directive? - - - No - Patient declined No - Patient declined No -  Patient declined No - Patient declined  Copy of [place] in Chart? No - copy requested No - copy requested No - copy requested No - copy requested No - copy requested No - copy requested -    Current Medications (verified) Outpatient Encounter Medications as of 06/25/2020  Medication Sig  . atorvastatin (LIPITOR) 80 MG tablet TAKE 1 TABLET BY MOUTH ONCE DAILY AT  6PM  . clopidogrel (PLAVIX) 75 MG tablet Take 1 tablet (75 mg total) by mouth daily.  . diazepam (VALIUM) 5 MG tablet Take 5 mg by mouth every 8 (eight) hours as needed (for anxiety).   Marland Kitchen lisinopril (ZESTRIL) 5 MG tablet Take 1 tablet (5 mg total) by mouth daily.  . metoprolol succinate (TOPROL-XL) 25 MG 24 hr tablet Take 0.5 tablets (12.5 mg total) by mouth daily.  . nitroGLYCERIN (NITROSTAT) 0.4 MG SL tablet PLACE ONE TABLET UNDER THE TONGUE EVERY 5 MINUTES AS NEEDED FOR CHEST PAIN  . traMADol (ULTRAM) 50 MG tablet Take 1 tablet (50 mg total) by mouth every 4 (four) hours as needed. (Patient taking differently: Take 50 mg by mouth every 4 (four) hours as needed (for pain). )   No facility-administered encounter medications on file as of 06/25/2020.    Allergies (verified) Zoloft [sertraline hcl]   History: Past Medical History:  Diagnosis Date  . AAA (abdominal aortic aneurysm) (HCC)    2.9 CM infrarenal AAA on CT 11/2011  . Acute anterior wall MI ([place]) 05/29/2014  . Alcoholism ([place])   . Aneurysm of abdominal aorta (HCC)    SMALL  . Anxiety    panic attacks, rare.  . Aortic aneurysm (HCC)    small  .  Arthritis   . Coronary artery disease    2 stents in july 2015  . Depression with anxiety   . Diverticulosis of colon 10/2009  . 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   . Gastritis 03/2012  . GERD (gastroesophageal reflux disease)   . GI bleed   . HFrEF (heart failure with reduced ejection fraction) ([place])   . HOH (hard of hearing)   . Hyperlipemia   . Hypertension   . Hypovolemic shock ([place])    . Kidney stones   . Myocardial infarction ([place]) 2015  . Panic attacks   . Positive H. pylori titer ?, before 2010   treated with Abx.   . Tubular adenoma of colon    Past Surgical History:  Procedure Laterality Date  . CARDIAC CATHETERIZATION  2015   Stents  . CATARACT EXTRACTION W/PHACO Right 09/21/2016   Procedure: CATARACT EXTRACTION PHACO AND INTRAOCULAR LENS PLACEMENT (IOC);  Surgeon: Eulogio Bear, MD;  Location: [facility] ORS;  Service: Ophthalmology;  Laterality: Right;  [place] 1.10 AP% 13.8 CDE 9.93 Fluid pack lot # [id number]  . CATARACT EXTRACTION W/PHACO Left 12/07/2016   Procedure: CATARACT EXTRACTION PHACO AND INTRAOCULAR LENS PLACEMENT (IOC);  Surgeon: Eulogio Bear, MD;  Location: [facility] ORS;  Service: Ophthalmology;  Laterality: Left;  [place] 00:42.7 ap 13.7 cde 5.85 lot #[id number]  . COLONOSCOPY WITH PROPOFOL Left 06/06/2015   Procedure: COLONOSCOPY WITH PROPOFOL;  Surgeon: Hulen Luster, MD;  Location: [facility] ENDOSCOPY;  Service: Endoscopy;  Laterality: Left;  . CORONARY ANGIOPLASTY     2015  . LEFT HEART CATHETERIZATION WITH CORONARY ANGIOGRAM Bilateral 05/29/2014   Procedure: LEFT HEART CATHETERIZATION WITH CORONARY ANGIOGRAM;  Surgeon: Laverda Page, MD;  Location: [facility] CATH LAB;  Service: Cardiovascular;  Laterality: Bilateral;  . PERCUTANEOUS CORONARY STENT INTERVENTION (PCI-S)  05/29/2014   Procedure: PERCUTANEOUS CORONARY STENT INTERVENTION (PCI-S);  Surgeon: Laverda Page, MD;  Location: [facility] CATH LAB;  Service: Cardiovascular;;  DES x2 Prox and Mid LAD    Family History  Problem Relation Age of Onset  . Ulcers Father        stomach   . Heart disease Father   . Heart disease Mother   . Anuerysm Brother   . Hypertension Daughter   . Heart disease Paternal Grandmother   . Heart attack Paternal Grandmother   . Heart disease Paternal Grandfather   . Colon cancer Neg Hx    Social History   Socioeconomic History  . Marital status: Married    Spouse name: Not on  file  . Number of children: 4  . Years of education: Not on file  . Highest education level: High school graduate  Occupational History  . Occupation: semi retired  Tobacco Use  . Smoking status: Current Some Day Smoker    Packs/day: 0.25    Years: 55.00    Pack years: 13.75    Types: Cigarettes  . Smokeless tobacco: Never Used  . Tobacco comment: 4-5 cigaretts daily   Vaping Use  . Vaping Use: Never used  Substance and Sexual Activity  . Alcohol use: No    Alcohol/week: 0.0 standard drinks  . Drug use: No  . Sexual activity: Not Currently  Other Topics Concern  . Not on file  Social History Narrative   Working 3.5 hours a day 6 days a week in [employer]    Social Determinants of Health   Financial Resource Strain: Low Risk   . Difficulty of Paying Living Expenses:  Not hard at all  Food Insecurity: No Food Insecurity  . Worried About Charity fundraiser in the Last Year: Never true  . Ran Out of Food in the Last Year: Never true  Transportation Needs: No Transportation Needs  . Lack of Transportation (Medical): No  . Lack of Transportation (Non-Medical): No  Physical Activity: Inactive  . Days of Exercise per Week: 0 days  . Minutes of Exercise per Session: 0 min  Stress: No Stress Concern Present  . Feeling of Stress : Not at all  Social Connections:   . Frequency of Communication with Friends and Family:   . Frequency of Social Gatherings with Friends and Family:   . Attends Religious Services:   . Active Member of Clubs or Organizations:   . Attends Archivist Meetings:   Marland Kitchen Marital Status:     Tobacco Counseling Ready to quit: No Counseling given: Not Answered Comment: 4-5 cigaretts daily    Clinical Intake:  Pre-visit preparation completed: Yes  Pain : No/denies pain     Nutritional Status: BMI of 19-24  Normal Nutritional Risks: Nausea/ vomitting/ diarrhea (nausea in afternoon) Diabetes: No  How often do you need to have someone help you  when you read instructions, pamphlets, or other written materials from your doctor or pharmacy?: 1 - Never What is the last grade level you completed in school?: 12th grade  Diabetic? no  Interpreter Needed?: No  Information entered by :: NAllen LPN   Activities of Daily Living In your present state of health, do you have any difficulty performing the following activities: 06/25/2020 10/02/2019  Hearing? Y -  Comment plan on going to [place] to get hearing aides -  Vision? N N  Difficulty concentrating or making decisions? Y N  Comment some difficulty -  Walking or climbing stairs? N N  Dressing or bathing? N N  Doing errands, shopping? N N  Preparing Food and eating ? N -  Using the Toilet? N -  In the past six months, have you accidently leaked urine? N -  Do you have problems with loss of bowel control? Y -  Comment occasionally -  Managing your Medications? N -  Managing your Finances? N -  Housekeeping or managing your Housekeeping? N -  Some recent data might be hidden    Patient Care Team: Valerie Roys, DO as PCP - General (Family Medicine) Pyrtle, Lajuan Lines, MD as Consulting Physician (Gastroenterology) Nathanial Rancher, MD as Referring Physician (Anesthesiology) Despina Hick, MD as Referring Physician (Cardiology)  Indicate any recent Medical Services you may have received from other than [facility] providers in the past year (date may be approximate).     Assessment:   This is a routine wellness examination for Brian [name].  Hearing/Vision screen  Hearing Screening   125Hz  250Hz  500Hz  1000Hz  2000Hz  3000Hz  4000Hz  6000Hz  8000Hz   Right ear:           Left ear:           Vision Screening Comments: No regular eye exams, [facility]  Dietary issues and exercise activities discussed: Current Exercise Habits: The patient does not participate in regular exercise at present  Goals    . Patient Stated     06/25/2020, keep working    . Quit Smoking     Smoking cessation  discussed      Depression Screen PHQ 2/9 Scores 06/25/2020 04/19/2020 10/02/2019 06/23/2019 06/26/2018 06/19/2018 12/25/2017  PHQ - 2 Score 0  2 0 0 0 0 0  PHQ- 9 Score - 4 0 - 0 - 0    Fall Risk Fall Risk  06/25/2020 04/19/2020 10/02/2019 06/23/2019 01/07/2019  Falls in the past year? 0 0 0 0 0  Number falls in past yr: - 0 0 - -  Injury with Fall? - 0 0 - -  Risk for fall due to : Medication side effect - - - -  Follow up Education provided;Falls prevention discussed;Falls evaluation completed - - - Falls evaluation completed    Any stairs in or around the home? Yes  If so, are there any without handrails? No  Home free of loose throw rugs in walkways, pet beds, electrical cords, etc? Yes  Adequate lighting in your home to reduce risk of falls? Yes   ASSISTIVE DEVICES UTILIZED TO PREVENT FALLS:  Life alert? No  Use of a cane, walker or w/c? No  Grab bars in the bathroom? Yes  Shower chair or bench in shower? No  Elevated toilet seat or a handicapped toilet? No   TIMED UP AND GO:  Was the test performed? No .    Cognitive Function:     6CIT Screen 06/25/2020 06/19/2018 12/25/2017 12/18/2016  What Year? 0 points 0 points 0 points 0 points  What month? 0 points 0 points 0 points 0 points  What time? 0 points 0 points 0 points 0 points  Count back from 20 0 points 0 points 0 points 0 points  Months in reverse 0 points 0 points 0 points 0 points  Repeat phrase 2 points 4 points 0 points 0 points  Total Score 2 4 0 0    Immunizations Immunization History  Administered Date(s) Administered  . Fluad Quad(high Dose 65+) 10/02/2019  . Influenza, High Dose Seasonal PF 07/22/2018  . Influenza-Unspecified 07/15/2015, 07/14/2017  . PFIZER SARS-COV-2 Vaccination 12/05/2019, 12/26/2019  . Pneumococcal Conjugate-13 12/18/2016  . Pneumococcal Polysaccharide-23 12/25/2017    TDAP status: Due, Education has been provided regarding the importance of this vaccine. Advised may receive this vaccine  at local pharmacy or Health Dept. Aware to provide a copy of the vaccination record if obtained from local pharmacy or Health Dept. Verbalized acceptance and understanding. Flu Vaccine status: Up to date Pneumococcal vaccine status: Up to date Covid-19 vaccine status: Completed vaccines  Qualifies for Shingles Vaccine? Yes   Zostavax completed No   Shingrix Completed?: No.    Education has been provided regarding the importance of this vaccine. Patient has been advised to call insurance company to determine out of pocket expense if they have not yet received this vaccine. Advised may also receive vaccine at local pharmacy or Health Dept. Verbalized acceptance and understanding.  Screening Tests Health Maintenance  Topic Date Due  . TETANUS/TDAP  11/14/2019  . INFLUENZA VACCINE  06/13/2020  . COVID-19 Vaccine  Completed  . PNA vac Low Risk Adult  Completed    Health Maintenance  Health Maintenance Due  Topic Date Due  . TETANUS/TDAP  11/14/2019  . INFLUENZA VACCINE  06/13/2020    Colorectal cancer screening: No longer required.   Lung Cancer Screening: (Low Dose CT Chest recommended if Age 54-80 years, 30 pack-year currently smoking OR have quit w/in 15years.) does not qualify.   Lung Cancer Screening Referral: no   Additional Screening:  Hepatitis C Screening: does not qualify;   Vision Screening: Recommended annual ophthalmology exams for early detection of glaucoma and other disorders of the eye. Is the patient up  to date with their annual eye exam?  No  Who is the provider or what is the name of the office in which the patient attends annual eye exams? [facility] If pt is not established with a provider, would they like to be referred to a provider to establish care? No .   Dental Screening: Recommended annual dental exams for proper oral hygiene  Community Resource Referral / Chronic Care Management: CRR required this visit?  No   CCM required this visit?   No      Plan:     I have personally reviewed and noted the following in the patient's chart:   . Medical and social history . Use of alcohol, tobacco or illicit drugs  . Current medications and supplements . Functional ability and status . Nutritional status . Physical activity . Advanced directives . List of other physicians . Hospitalizations, surgeries, and ER visits in previous 12 months . Vitals . Screenings to include cognitive, depression, and falls . Referrals and appointments  In addition, I have reviewed and discussed with patient certain preventive protocols, quality metrics, and best practice recommendations. A written personalized care plan for preventive services as well as general preventive health recommendations were provided to patient.     Kellie Simmering, LPN   [id number]   Nurse Notes:

## 2020-06-28 ENCOUNTER — Telehealth: Payer: Self-pay | Admitting: Family Medicine

## 2020-06-28 NOTE — Telephone Encounter (Signed)
He's already on an ace-inhibitor. I would not advise him to do anything, but he can see what his cardiologist says

## 2020-06-28 NOTE — Telephone Encounter (Signed)
Patient's wife notified of Dr. Durenda Age response. States that the patient has an appointment coming up soon with cardiology.

## 2020-06-28 NOTE — Telephone Encounter (Addendum)
Pt wife received a letter from [place] asking if pt  needs  High  ACE patient  has had heart attacks. Pt wife will also call his cardiologist. Please advise. Pt is on metoprolol. Pt next appt in dec 2021

## 2020-06-28 NOTE — Telephone Encounter (Signed)
Routing to provider. Is there anything we need to do or does the cardiologist need to advise them?

## 2020-07-12 DIAGNOSIS — M545 Low back pain: Secondary | ICD-10-CM | POA: Diagnosis not present

## 2020-07-12 DIAGNOSIS — R69 Illness, unspecified: Secondary | ICD-10-CM | POA: Diagnosis not present

## 2020-07-12 DIAGNOSIS — G894 Chronic pain syndrome: Secondary | ICD-10-CM | POA: Diagnosis not present

## 2020-07-21 ENCOUNTER — Encounter: Payer: Self-pay | Admitting: Family Medicine

## 2020-08-16 ENCOUNTER — Ambulatory Visit: Payer: Medicare HMO

## 2020-08-16 ENCOUNTER — Other Ambulatory Visit: Payer: Self-pay

## 2020-08-16 DIAGNOSIS — I714 Abdominal aortic aneurysm, without rupture, unspecified: Secondary | ICD-10-CM

## 2020-08-17 NOTE — Progress Notes (Signed)
Your patient 

## 2020-08-26 ENCOUNTER — Encounter: Payer: Self-pay | Admitting: Cardiology

## 2020-08-26 ENCOUNTER — Ambulatory Visit: Payer: Medicare HMO | Admitting: Cardiology

## 2020-08-26 ENCOUNTER — Other Ambulatory Visit: Payer: Self-pay

## 2020-08-26 VITALS — BP 148/89 | HR 77 | Resp 16 | Ht 68.0 in | Wt 132.0 lb

## 2020-08-26 DIAGNOSIS — I714 Abdominal aortic aneurysm, without rupture, unspecified: Secondary | ICD-10-CM

## 2020-08-26 DIAGNOSIS — E782 Mixed hyperlipidemia: Secondary | ICD-10-CM

## 2020-08-26 DIAGNOSIS — Z720 Tobacco use: Secondary | ICD-10-CM

## 2020-08-26 DIAGNOSIS — I2510 Atherosclerotic heart disease of native coronary artery without angina pectoris: Secondary | ICD-10-CM | POA: Diagnosis not present

## 2020-08-26 DIAGNOSIS — I119 Hypertensive heart disease without heart failure: Secondary | ICD-10-CM

## 2020-08-26 DIAGNOSIS — I351 Nonrheumatic aortic (valve) insufficiency: Secondary | ICD-10-CM | POA: Diagnosis not present

## 2020-08-26 MED ORDER — METOPROLOL SUCCINATE ER 25 MG PO TB24: 25.0000 mg | ORAL_TABLET | Freq: Every day | ORAL | 2 refills | Status: DC

## 2020-08-26 NOTE — Progress Notes (Signed)
Follow up visit  Subjective:   Brian [name], male    DOB: 1933-03-03, 86 y.o.   MRN: [id number]   Chief Complaint  Patient presents with  . Coronary Artery Disease  . Follow-up  . Results    HPI  86 y/o caucasian male with hypertension, hyperlipidemia, CAD h/o STEMI and ostial and mid LAD PCI 2015, AAA 2.9 cm (2013).  Patient is doing well without any symptoms of chest pain, shortness of breath, palpitations, leg edema, orthopnea, PND, TIA/syncope. He continues to smoke 3-4 cigarettes. He says he gets his "nicotine fix" with few puffs of a large cigarette, saves the butt for another puff. He does not think he can quit smoking.   Recent AAA duplex study reviewed with the patient, details below.  Blood pressure slightly elevated today.   Current Outpatient Medications on File Prior to Visit  Medication Sig Dispense Refill  . atorvastatin (LIPITOR) 80 MG tablet TAKE 1 TABLET BY MOUTH ONCE DAILY AT  6PM 90 tablet 1  . clopidogrel (PLAVIX) 75 MG tablet Take 1 tablet (75 mg total) by mouth daily. 90 tablet 3  . diazepam (VALIUM) 5 MG tablet Take 5 mg by mouth every 8 (eight) hours as needed (for anxiety).     Marland Kitchen lisinopril (ZESTRIL) 5 MG tablet Take 1 tablet (5 mg total) by mouth daily. 90 tablet 1  . metoprolol succinate (TOPROL-XL) 25 MG 24 hr tablet Take 0.5 tablets (12.5 mg total) by mouth daily. 45 tablet 1  . nitroGLYCERIN (NITROSTAT) 0.4 MG SL tablet PLACE ONE TABLET UNDER THE TONGUE EVERY 5 MINUTES AS NEEDED FOR CHEST PAIN 25 tablet 0  . traMADol (ULTRAM) 50 MG tablet Take 1 tablet (50 mg total) by mouth every 4 (four) hours as needed. (Patient taking differently: Take 50 mg by mouth every 4 (four) hours as needed (for pain). ) 60 tablet 0   No current facility-administered medications on file prior to visit.    Cardiovascular studies:  EKG 08/26/2020: Sinus rhythm 87 bpm  Left axis deviation Cannot exclude old anteroseptal infarct  Abdominal Aortic Duplex  08/16/2020:  Dumbbell shaped proximal and mid abdominal aortic aneurysm. Laminated  thrombus in mid aorta.  An abdominal aortic aneurysm measuring 3.74 x 3.74 x 3.71 cm is seen.  Normal flow velocities noted in the abdominal aorta and iliac arteries.  No significant change from 09/05/2019. Recheck in 2 years. See attached  image.  Echocardiogram 10/29/2016: - Technically difficult study with poor images. Normal LV size with   EF 60-65%. Normal RV size and systolic function. No significant   valvular abnormalities.  Coronary angiography and intervention 2015: LM: Normal LAD: Ostial 80% stenosis-->successful but suboptimal stenting 3.0X12 mm promus Premier      Dissufe and aneurysmal mid vessel disease-->2.5X28 mm Promus Premier      Distap apical LAD embolization LCx: Prox 30-40%, mid ectatic 30-40% stenosis.  RCA: Dominant. Mid 20-30% stenosis. Mild diffuse disease with ectasia in mid to distal segement. RPL 10-15% stenosis.  CT abdomen w/contrast 2013: 1. Diverticulosis of the visualized colon.  2. Borderline infrarenal abdominal aortic aneurysm 2.9 cm, slightly increased  from  prior.  3. Left-sided nephrolithiasis, without hydronephrosis.    Recent labs: 04/19/2020: Glucose 118, BUN/Cr 7/0.81. EGFR 82. Na/K 143/4.5. Rest of the CMP normal H/H 13/39. MCV 98. Platelets 165 Chol 135, TG 92, HDL 58, LDL 68 TSH 3.6 normal   Review of Systems  Cardiovascular: Negative for chest pain, dyspnea on exertion, leg swelling, palpitations  and syncope.         Vitals:   08/26/20 0947  BP: (!) 148/89  Pulse: 77  Resp: 16  SpO2: 93%     Body mass index is 20.07 kg/m. Filed Weights   08/26/20 0947  Weight: 132 lb (59.9 kg)    Objective:   Physical Exam Vitals and nursing note reviewed.  Constitutional:      General: He is not in acute distress. Neck:     Vascular: No JVD.  Cardiovascular:     Rate and Rhythm: Normal rate and regular rhythm.     Heart sounds:  Normal heart sounds. No murmur heard.   Pulmonary:     Effort: Pulmonary effort is normal.     Breath sounds: Normal breath sounds. No wheezing or rales.         Assessment & Recommendations:   87 y/o caucasian male with hypertension, hyperlipidemia, CAD h/o STEMI and ostial and mid LAD PCI 2015, AAA 2.9 cm (2013).  CAD: Stable with no angina symptoms.  He has been on plavix without Aspirin with no bleeding issues. Continue the same, along with Lipitor 10, lisinopril 5 mg. Increase metoprolol succinate 25 mg,   AAA:  Stable. Will obtain US duplex in 2023  Tobacco cessation counseling: - Currently smoking 3-4 cigarettes/day   - Patient was informed of the dangers of tobacco abuse including stroke, cancer, and MI, as well as benefits of tobacco cessation. - Patient is not willing to quit at this time. - Approximately 5 mins were spent counseling patient cessation techniques. We discussed various methods to help quit smoking, including deciding on a date to quit, joining a support group, pharmacological agents. He is not willing to quit at this time.  - I will reassess his progress at the next follow-up visit  F/u in 1 year  Manish J Patwardhan, MD [facility] Pager: [phone] Office: [phone] If no answer Cell [phone]   

## 2020-09-07 ENCOUNTER — Other Ambulatory Visit: Payer: Medicare HMO

## 2020-10-01 ENCOUNTER — Other Ambulatory Visit: Payer: Self-pay | Admitting: Family Medicine

## 2020-10-01 DIAGNOSIS — I2510 Atherosclerotic heart disease of native coronary artery without angina pectoris: Secondary | ICD-10-CM

## 2020-10-04 ENCOUNTER — Other Ambulatory Visit: Payer: Self-pay | Admitting: Family Medicine

## 2020-10-19 ENCOUNTER — Encounter: Payer: Medicare HMO | Admitting: Family Medicine

## 2020-10-25 ENCOUNTER — Encounter: Payer: Medicare HMO | Admitting: Family Medicine

## 2020-10-27 ENCOUNTER — Other Ambulatory Visit: Payer: Self-pay | Admitting: Family Medicine

## 2020-11-01 ENCOUNTER — Encounter: Payer: Medicare HMO | Admitting: Family Medicine

## 2020-11-02 DIAGNOSIS — Z79891 Long term (current) use of opiate analgesic: Secondary | ICD-10-CM | POA: Diagnosis not present

## 2020-11-02 DIAGNOSIS — F1120 Opioid dependence, uncomplicated: Secondary | ICD-10-CM | POA: Diagnosis not present

## 2020-11-03 ENCOUNTER — Other Ambulatory Visit: Payer: Self-pay | Admitting: Family Medicine

## 2020-11-08 DIAGNOSIS — M545 Low back pain, unspecified: Secondary | ICD-10-CM | POA: Diagnosis not present

## 2020-11-08 DIAGNOSIS — R69 Illness, unspecified: Secondary | ICD-10-CM | POA: Diagnosis not present

## 2020-11-08 DIAGNOSIS — G894 Chronic pain syndrome: Secondary | ICD-10-CM | POA: Diagnosis not present

## 2020-11-18 ENCOUNTER — Telehealth: Payer: Self-pay

## 2020-11-18 NOTE — Telephone Encounter (Signed)
Patient wife states he began with symptoms yesterday and tested positive with an at home test. Patient does not want an appointment at this time.

## 2020-11-18 NOTE — Telephone Encounter (Signed)
Please let them know that I have sent a message to add him to the list for the [facility] infusion clinic. They have a very limited supply of medication right now. If he qualifies for the infusion he will be contacted within 24-48 hours. If he does not qualify, he will not be contacted. Thanks!

## 2020-11-18 NOTE — Telephone Encounter (Signed)
I will send a message over to the infusion center- please find out when his symptoms started and find out if they need a virtual appointment for other symptomatic care

## 2020-11-18 NOTE — Telephone Encounter (Signed)
Copied from CRM [id number]. Topic: General - Other >> Nov 18, 2020  8:19 AM Gwenlyn Fudge wrote: Reason for CRM: Pts daughter called stating that the pt tested positive for covid 11/18/19. She states that the pt is weak, and has a fever. She is requesting to have the pt set up with the infusion clinic. Please advise.

## 2020-11-19 ENCOUNTER — Telehealth: Payer: Self-pay | Admitting: Adult Health

## 2020-11-19 ENCOUNTER — Other Ambulatory Visit: Payer: Self-pay | Admitting: Adult Health

## 2020-11-19 DIAGNOSIS — U071 COVID-19: Secondary | ICD-10-CM

## 2020-11-19 NOTE — Telephone Encounter (Signed)
Called patient on home and cell # to discuss COVID treatment.  LMOM to return call.    Wilber Bihari, NP

## 2020-11-19 NOTE — Progress Notes (Signed)
I connected by phone with Brian [name] on 11/19/2020 at 4:58 PM to discuss the potential use of a new treatment for mild to moderate COVID-19 viral infection in non-hospitalized patients.  This patient is a 87 y.o. male that meets the FDA criteria for Emergency Use Authorization of COVID monoclonal antibody sotrovimab.  Has a (+) direct SARS-CoV-2 viral test result  Has mild or moderate COVID-19   Is NOT hospitalized due to COVID-19  Is within 10 days of symptom onset  Has at least one of the high risk factor(s) for progression to severe COVID-19 and/or hospitalization as defined in EUA.  Specific high risk criteria : Older age (>/= 85 yo), Cardiovascular disease or hypertension and Chronic Lung Disease   Sx onset 11/17/2020   I have spoken and communicated the following to the patient or parent/caregiver regarding COVID monoclonal antibody treatment:  1. FDA has authorized the emergency use for the treatment of mild to moderate COVID-19 in adults and pediatric patients with positive results of direct SARS-CoV-2 viral testing who are 62 years of age and older weighing at least 40 kg, and who are at high risk for progressing to severe COVID-19 and/or hospitalization.  2. The significant known and potential risks and benefits of COVID monoclonal antibody, and the extent to which such potential risks and benefits are unknown.  3. Information on available alternative treatments and the risks and benefits of those alternatives, including clinical trials.  4. Patients treated with COVID monoclonal antibody should continue to self-isolate and use infection control measures (e.g., wear mask, isolate, social distance, avoid sharing personal items, clean and disinfect "high touch" surfaces, and frequent handwashing) according to CDC guidelines.   5. The patient or parent/caregiver has the option to accept or refuse COVID monoclonal antibody treatment.  After reviewing this information with the  patient, the patient has agreed to receive one of the available covid 19 monoclonal antibodies and will be provided an appropriate fact sheet prior to infusion. Scot Dock, NP 11/19/2020 4:58 PM

## 2020-11-20 ENCOUNTER — Ambulatory Visit (HOSPITAL_COMMUNITY)
Admission: RE | Admit: 2020-11-20 | Discharge: 2020-11-20 | Disposition: A | Discharge status: Home / Self Care | Payer: Medicare HMO | Source: Ambulatory Visit | Attending: Pulmonary Disease | Admitting: Pulmonary Disease

## 2020-11-20 DIAGNOSIS — U071 COVID-19: Secondary | ICD-10-CM | POA: Insufficient documentation

## 2020-11-20 MED ORDER — SODIUM CHLORIDE 0.9 % IV SOLN: INTRAVENOUS | Status: DC | PRN

## 2020-11-20 MED ORDER — DIPHENHYDRAMINE HCL 50 MG/ML IJ SOLN: 50.0000 mg | Freq: Once | INTRAMUSCULAR | Status: DC | PRN

## 2020-11-20 MED ORDER — EPINEPHRINE 0.3 MG/0.3ML IJ SOAJ: 0.3000 mg | Freq: Once | INTRAMUSCULAR | Status: DC | PRN

## 2020-11-20 MED ORDER — FAMOTIDINE IN NACL 20-0.9 MG/50ML-% IV SOLN: 20.0000 mg | Freq: Once | INTRAVENOUS | Status: DC | PRN

## 2020-11-20 MED ORDER — METHYLPREDNISOLONE SODIUM SUCC 125 MG IJ SOLR: 125.0000 mg | Freq: Once | INTRAMUSCULAR | Status: DC | PRN

## 2020-11-20 MED ORDER — SOTROVIMAB 500 MG/8ML IV SOLN
500.0000 mg | Freq: Once | INTRAVENOUS | Status: AC
  Administered 2020-11-20: 500 mg via INTRAVENOUS

## 2020-11-20 MED ORDER — ALBUTEROL SULFATE HFA 108 (90 BASE) MCG/ACT IN AERS: 2.0000 | INHALATION_SPRAY | Freq: Once | RESPIRATORY_TRACT | Status: DC | PRN

## 2020-11-20 NOTE — Progress Notes (Signed)
  Diagnosis: COVID-19  Physician:  Dr. Asencion Noble   Procedure:  Sotrovimab administered via IV.  Patient provided with medication fact sheet prior to infusion and questions answered.  Patient provided with discharge instructions at discharge and all questions answered.  Complications: No immediate complications noted.  Discharge: Discharged home   Monna Fam 11/20/2020

## 2020-11-20 NOTE — Progress Notes (Signed)
Patient reviewed Fact Sheet for Patients, Parents, and Caregivers for Emergency Use Authorization (EUA) of Sotrovimab for the Treatment of Coronavirus. Patient also reviewed and is agreeable to the estimated cost of treatment. Patient is agreeable to proceed.   

## 2020-11-20 NOTE — Discharge Instructions (Signed)
10 Things You Can Do to Manage Your COVID-19 Symptoms at Home If you have possible or confirmed COVID-19: 1. Stay home from work and school. And stay away from other public places. If you must go out, avoid using any kind of public transportation, ridesharing, or taxis. 2. Monitor your symptoms carefully. If your symptoms get worse, call your healthcare provider immediately. 3. Get rest and stay hydrated. 4. If you have a medical appointment, call the healthcare provider ahead of time and tell them that you have or may have COVID-19. 5. For medical emergencies, call 911 and notify the dispatch personnel that you have or may have COVID-19. 6. Cover your cough and sneezes with a tissue or use the inside of your elbow. 7. Wash your hands often with soap and water for at least 20 seconds or clean your hands with an alcohol-based hand sanitizer that contains at least 60% alcohol. 8. As much as possible, stay in a specific room and away from other people in your home. Also, you should use a separate bathroom, if available. If you need to be around other people in or outside of the home, wear a mask. 9. Avoid sharing personal items with other people in your household, like dishes, towels, and bedding. 10. Clean all surfaces that are touched often, like counters, tabletops, and doorknobs. Use household cleaning sprays or wipes according to the label instructions. cdc.gov/coronavirus 05/14/2019 This information is not intended to replace advice given to you by your health care provider. Make sure you discuss any questions you have with your health care provider. Document Revised: 10/16/2019 Document Reviewed: 10/16/2019 Elsevier Patient Education  2020 Elsevier Inc. What types of side effects do monoclonal antibody drugs cause?  Common side effects  In general, the more common side effects caused by monoclonal antibody drugs include: . Allergic reactions, such as hives or itching . Flu-like signs and  symptoms, including chills, fatigue, fever, and muscle aches and pains . Nausea, vomiting . Diarrhea . Skin rashes . Low blood pressure   The CDC is recommending patients who receive monoclonal antibody treatments wait at least 90 days before being vaccinated.  Currently, there are no data on the safety and efficacy of mRNA COVID-19 vaccines in persons who received monoclonal antibodies or convalescent plasma as part of COVID-19 treatment. Based on the estimated half-life of such therapies as well as evidence suggesting that reinfection is uncommon in the 90 days after initial infection, vaccination should be deferred for at least 90 days, as a precautionary measure until additional information becomes available, to avoid interference of the antibody treatment with vaccine-induced immune responses. If you have any questions or concerns after the infusion please call the Advanced Practice Provider on call at [phone]. This number is ONLY intended for your use regarding questions or concerns about the infusion post-treatment side-effects.  Please do not provide this number to others for use. For return to work notes please contact your primary care provider.   If someone you know is interested in receiving treatment please have them call the COVID hotline at [phone].   

## 2020-11-23 ENCOUNTER — Encounter: Payer: Medicare HMO | Admitting: Family Medicine

## 2020-12-14 DIAGNOSIS — F1120 Opioid dependence, uncomplicated: Secondary | ICD-10-CM | POA: Diagnosis not present

## 2020-12-14 DIAGNOSIS — Z79891 Long term (current) use of opiate analgesic: Secondary | ICD-10-CM | POA: Diagnosis not present

## 2020-12-15 ENCOUNTER — Ambulatory Visit (INDEPENDENT_AMBULATORY_CARE_PROVIDER_SITE_OTHER): Payer: Medicare HMO | Admitting: Family Medicine

## 2020-12-15 ENCOUNTER — Encounter: Payer: Self-pay | Admitting: Family Medicine

## 2020-12-15 ENCOUNTER — Other Ambulatory Visit: Payer: Self-pay

## 2020-12-15 VITALS — BP 137/82 | HR 67 | Temp 97.8°F | Ht 67.5 in | Wt 130.4 lb

## 2020-12-15 DIAGNOSIS — E782 Mixed hyperlipidemia: Secondary | ICD-10-CM

## 2020-12-15 DIAGNOSIS — I5020 Unspecified systolic (congestive) heart failure: Secondary | ICD-10-CM

## 2020-12-15 DIAGNOSIS — Z23 Encounter for immunization: Secondary | ICD-10-CM | POA: Diagnosis not present

## 2020-12-15 DIAGNOSIS — Z0000 Encounter for general adult medical examination without abnormal findings: Secondary | ICD-10-CM

## 2020-12-15 DIAGNOSIS — I714 Abdominal aortic aneurysm, without rupture, unspecified: Secondary | ICD-10-CM

## 2020-12-15 DIAGNOSIS — S81801A Unspecified open wound, right lower leg, initial encounter: Secondary | ICD-10-CM | POA: Diagnosis not present

## 2020-12-15 DIAGNOSIS — F32A Depression, unspecified: Secondary | ICD-10-CM

## 2020-12-15 DIAGNOSIS — I129 Hypertensive chronic kidney disease with stage 1 through stage 4 chronic kidney disease, or unspecified chronic kidney disease: Secondary | ICD-10-CM | POA: Diagnosis not present

## 2020-12-15 DIAGNOSIS — I2510 Atherosclerotic heart disease of native coronary artery without angina pectoris: Secondary | ICD-10-CM

## 2020-12-15 DIAGNOSIS — F419 Anxiety disorder, unspecified: Secondary | ICD-10-CM | POA: Diagnosis not present

## 2020-12-15 DIAGNOSIS — K4090 Unilateral inguinal hernia, without obstruction or gangrene, not specified as recurrent: Secondary | ICD-10-CM

## 2020-12-15 DIAGNOSIS — R69 Illness, unspecified: Secondary | ICD-10-CM | POA: Diagnosis not present

## 2020-12-15 LAB — URINALYSIS, ROUTINE W REFLEX MICROSCOPIC
Bilirubin, UA: NEGATIVE
Glucose, UA: NEGATIVE
Ketones, UA: NEGATIVE
Leukocytes,UA: NEGATIVE
Nitrite, UA: NEGATIVE
Protein,UA: NEGATIVE
RBC, UA: NEGATIVE
Specific Gravity, UA: 1.015 (ref 1.005–1.030)
Urobilinogen, Ur: 0.2 mg/dL (ref 0.2–1.0)
pH, UA: 5.5 (ref 5.0–7.5)

## 2020-12-15 LAB — MICROALBUMIN, URINE WAIVED
Creatinine, Urine Waived: 50 mg/dL (ref 10–300)
Microalb, Ur Waived: 10 mg/L (ref 0–19)

## 2020-12-15 MED ORDER — ATORVASTATIN CALCIUM 80 MG PO TABS: ORAL_TABLET | ORAL | 1 refills | Status: DC

## 2020-12-15 MED ORDER — CLOPIDOGREL BISULFATE 75 MG PO TABS: 75.0000 mg | ORAL_TABLET | Freq: Every day | ORAL | 3 refills | Status: DC

## 2020-12-15 MED ORDER — LISINOPRIL 5 MG PO TABS: 5.0000 mg | ORAL_TABLET | Freq: Every day | ORAL | 1 refills | Status: DC

## 2020-12-15 MED ORDER — METOPROLOL SUCCINATE ER 25 MG PO TB24: 25.0000 mg | ORAL_TABLET | Freq: Every day | ORAL | 1 refills | Status: DC

## 2020-12-15 NOTE — Progress Notes (Signed)
BP 137/82   Pulse 67   Temp 97.8 F (36.6 C)   Ht 5' 7.5" (1.715 m)   Wt 130 lb 6.4 oz (59.1 kg)   SpO2 98%   BMI 20.12 kg/m    Subjective:    Patient ID: Brian [name], male    DOB: 15-Dec-1932, 87 y.o.   MRN: [id number]  HPI: Brian [name] is a 87 y.o. male presenting on 12/15/2020 for comprehensive medical examination. Current medical complaints include:  HYPERTENSION / HYPERLIPIDEMIA Satisfied with current treatment? yes Duration of hypertension: chronic BP monitoring frequency: not checking BP medication side effects: no Past BP meds: metoprolol, lisinopril Duration of hyperlipidemia: chronic Cholesterol medication side effects: no Cholesterol supplements: none Past cholesterol medications: atorvastatin Medication compliance: excellent compliance Aspirin: no Recent stressors: no Recurrent headaches: no Visual changes: no Palpitations: no Dyspnea: no Chest pain: no Lower extremity edema: no Dizzy/lightheaded: yes  He currently lives with: wife Interim Problems from his last visit: no  Depression Screen done today and results listed below:  Depression screen [facility] 2/9 12/15/2020 06/25/2020 04/19/2020 10/02/2019 06/23/2019  Decreased Interest 0 0 1 0 0  Down, Depressed, Hopeless 0 0 1 0 0  PHQ - 2 Score 0 0 2 0 0  Altered sleeping - - 0 0 -  Tired, decreased energy - - 1 0 -  Change in appetite - - 1 0 -  Feeling bad or failure about yourself  - - 0 0 -  Trouble concentrating - - 0 0 -  Moving slowly or fidgety/restless - - 0 0 -  Suicidal thoughts - - 0 0 -  PHQ-9 Score - - 4 0 -  Difficult doing work/chores - - Not difficult at all Not difficult at all -    Past Medical History:  Past Medical History:  Diagnosis Date  . AAA (abdominal aortic aneurysm) (HCC)    2.9 CM infrarenal AAA on CT 11/2011  . Acute anterior wall MI ([place]) 05/29/2014  . Alcoholism ([place])   . Aneurysm of abdominal aorta (HCC)    SMALL  . Anxiety    panic attacks, rare.  . Aortic  aneurysm (HCC)    small  . Arthritis   . Coronary artery disease    2 stents in july 2015  . Depression with anxiety   . Diverticulosis of colon 10/2009  . 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   . Gastritis 03/2012  . GERD (gastroesophageal reflux disease)   . GI bleed   . HFrEF (heart failure with reduced ejection fraction) ([place])   . HOH (hard of hearing)   . Hyperlipemia   . Hypertension   . Hypovolemic shock ([place])   . Kidney stones   . Myocardial infarction ([place]) 2015  . Panic attacks   . Positive H. pylori titer ?, before 2010   treated with Abx.   . Tubular adenoma of colon     Surgical History:  Past Surgical History:  Procedure Laterality Date  . CARDIAC CATHETERIZATION  2015   Stents  . CATARACT EXTRACTION W/PHACO Right 09/21/2016   Procedure: CATARACT EXTRACTION PHACO AND INTRAOCULAR LENS PLACEMENT (IOC);  Surgeon: Eulogio Bear, MD;  Location: [facility] ORS;  Service: Ophthalmology;  Laterality: Right;  [place] 1.10 AP% 13.8 CDE 9.93 Fluid pack lot # [id number]  . CATARACT EXTRACTION W/PHACO Left 12/07/2016   Procedure: CATARACT EXTRACTION PHACO AND INTRAOCULAR LENS PLACEMENT (IOC);  Surgeon: Eulogio Bear, MD;  Location: [facility] ORS;  Service: Ophthalmology;  Laterality: Left;  [place] 00:42.7 ap 13.7 cde 5.85 lot #[id number]  . COLONOSCOPY WITH PROPOFOL Left 06/06/2015   Procedure: COLONOSCOPY WITH PROPOFOL;  Surgeon: Hulen Luster, MD;  Location: [facility] ENDOSCOPY;  Service: Endoscopy;  Laterality: Left;  . CORONARY ANGIOPLASTY     2015  . LEFT HEART CATHETERIZATION WITH CORONARY ANGIOGRAM Bilateral 05/29/2014   Procedure: LEFT HEART CATHETERIZATION WITH CORONARY ANGIOGRAM;  Surgeon: Laverda Page, MD;  Location: [facility] CATH LAB;  Service: Cardiovascular;  Laterality: Bilateral;  . PERCUTANEOUS CORONARY STENT INTERVENTION (PCI-S)  05/29/2014   Procedure: PERCUTANEOUS CORONARY STENT INTERVENTION (PCI-S);  Surgeon: Laverda Page, MD;  Location: [facility] CATH LAB;  Service:  Cardiovascular;;  DES x2 Prox and Mid LAD     Medications:  Current Outpatient Medications on File Prior to Visit  Medication Sig  . diazepam (VALIUM) 5 MG tablet Take 5 mg by mouth every 8 (eight) hours as needed (for anxiety).   . nitroGLYCERIN (NITROSTAT) 0.4 MG SL tablet PLACE ONE TABLET UNDER THE TONGUE EVERY 5 MINUTES AS NEEDED FOR CHEST PAIN  . traMADol (ULTRAM) 50 MG tablet Take 1 tablet (50 mg total) by mouth every 4 (four) hours as needed. (Patient taking differently: Take 50 mg by mouth every 4 (four) hours as needed (for pain).)   No current facility-administered medications on file prior to visit.    Allergies:  Allergies  Allergen Reactions  . Zoloft [Sertraline Hcl] Nausea Only    Patient states that he was not himself when he took this drug    Social History:  Social History   Socioeconomic History  . Marital status: Married    Spouse name: Not on file  . Number of children: 4  . Years of education: Not on file  . Highest education level: High school graduate  Occupational History  . Occupation: semi retired  Tobacco Use  . Smoking status: Current Some Day Smoker    Packs/day: 0.25    Years: 55.00    Pack years: 13.75    Types: Cigarettes  . Smokeless tobacco: Never Used  . Tobacco comment: 3-4 cigarettes daily   Vaping Use  . Vaping Use: Never used  Substance and Sexual Activity  . Alcohol use: No    Alcohol/week: 0.0 standard drinks  . Drug use: No  . Sexual activity: Not Currently  Other Topics Concern  . Not on file  Social History Narrative   Working 3.5 hours a day 6 days a week in [employer]    Social Determinants of Health   Financial Resource Strain: Low Risk   . Difficulty of Paying Living Expenses: Not hard at all  Food Insecurity: No Food Insecurity  . Worried About Charity fundraiser in the Last Year: Never true  . Ran Out of Food in the Last Year: Never true  Transportation Needs: No Transportation Needs  . Lack of Transportation  (Medical): No  . Lack of Transportation (Non-Medical): No  Physical Activity: Inactive  . Days of Exercise per Week: 0 days  . Minutes of Exercise per Session: 0 min  Stress: No Stress Concern Present  . Feeling of Stress : Not at all  Social Connections: Not on file  Intimate Partner Violence: Not on file   Social History   Tobacco Use  Smoking Status Current Some Day Smoker  . Packs/day: 0.25  . Years: 55.00  . Pack years: 13.75  . Types: Cigarettes  Smokeless Tobacco Never Used  Tobacco Comment   3-4 cigarettes  daily    Social History   Substance and Sexual Activity  Alcohol Use No  . Alcohol/week: 0.0 standard drinks    Family History:  Family History  Problem Relation Age of Onset  . Ulcers Father        stomach   . Heart disease Father   . Heart disease Mother   . Anuerysm Brother   . Hypertension Daughter   . Heart disease Paternal Grandmother   . Heart attack Paternal Grandmother   . Heart disease Paternal Grandfather   . Colon cancer Neg Hx     Past medical history, surgical history, medications, allergies, family history and social history reviewed with patient today and changes made to appropriate areas of the chart.   Review of Systems  Constitutional: Positive for chills. Negative for diaphoresis, fever, malaise/fatigue and weight loss.  HENT: Positive for hearing loss. Negative for congestion, ear discharge, ear pain, nosebleeds, sinus pain, sore throat and tinnitus.   Eyes: Negative.   Respiratory: Negative.  Negative for stridor.   Cardiovascular: Negative.   Gastrointestinal: Positive for constipation and diarrhea. Negative for abdominal pain, blood in stool, heartburn, melena, nausea and vomiting.  Genitourinary: Negative.   Musculoskeletal: Positive for myalgias. Negative for back pain, falls, joint pain and neck pain.  Skin: Negative.   Endo/Heme/Allergies: Negative for environmental allergies and polydipsia. Bruises/bleeds easily.   All  other ROS negative except what is listed above and in the HPI.      Objective:    BP 137/82   Pulse 67   Temp 97.8 F (36.6 C)   Ht 5' 7.5" (1.715 m)   Wt 130 lb 6.4 oz (59.1 kg)   SpO2 98%   BMI 20.12 kg/m   Wt Readings from Last 3 Encounters:  12/15/20 130 lb 6.4 oz (59.1 kg)  08/26/20 132 lb (59.9 kg)  06/25/20 132 lb (59.9 kg)    Physical Exam Vitals and nursing note reviewed.  Constitutional:      General: He is not in acute distress.    Appearance: Normal appearance. He is obese. He is not ill-appearing, toxic-appearing or diaphoretic.  HENT:     Head: Normocephalic and atraumatic.     Right Ear: Tympanic membrane, ear canal and external ear normal. There is no impacted cerumen.     Left Ear: Tympanic membrane, ear canal and external ear normal. There is no impacted cerumen.     Nose: Nose normal. No congestion or rhinorrhea.     Mouth/Throat:     Mouth: Mucous membranes are moist.     Pharynx: Oropharynx is clear. No oropharyngeal exudate or posterior oropharyngeal erythema.  Eyes:     General: No scleral icterus.       Right eye: No discharge.        Left eye: No discharge.     Extraocular Movements: Extraocular movements intact.     Conjunctiva/sclera: Conjunctivae normal.     Pupils: Pupils are equal, round, and reactive to light.  Neck:     Vascular: No carotid bruit.  Cardiovascular:     Rate and Rhythm: Normal rate and regular rhythm.     Pulses: Normal pulses.     Heart sounds: No murmur heard. No friction rub. No gallop.   Pulmonary:     Effort: Pulmonary effort is normal. No respiratory distress.     Breath sounds: Normal breath sounds. No stridor. No wheezing, rhonchi or rales.  Chest:     Chest wall: No tenderness.  Abdominal:     General: Abdomen is flat. Bowel sounds are normal. There is no distension.     Palpations: Abdomen is soft. There is no mass.     Tenderness: There is no abdominal tenderness. There is no right CVA tenderness, left CVA  tenderness, guarding or rebound.     Hernia: No hernia is present.  Genitourinary:    Comments: Genital exam deferred with shared decision making Musculoskeletal:        General: No swelling, tenderness, deformity or signs of injury.     Cervical back: Normal range of motion and neck supple. No rigidity. No muscular tenderness.     Right lower leg: No edema.     Left lower leg: No edema.  Lymphadenopathy:     Cervical: No cervical adenopathy.  Skin:    General: Skin is warm and dry.     Capillary Refill: Capillary refill takes less than 2 seconds.     Coloration: Skin is not jaundiced or pale.     Findings: No bruising, erythema, lesion or rash.     Comments: Small well healing wound on R leg  Neurological:     General: No focal deficit present.     Mental Status: He is alert and oriented to person, place, and time.     Cranial Nerves: No cranial nerve deficit.     Sensory: No sensory deficit.     Motor: No weakness.     Coordination: Coordination normal.     Gait: Gait normal.     Deep Tendon Reflexes: Reflexes normal.  Psychiatric:        Mood and Affect: Mood normal.        Behavior: Behavior normal.        Thought Content: Thought content normal.        Judgment: Judgment normal.     Results for orders placed or performed in visit on 12/15/20  Microalbumin, Urine Waived  Result Value Ref Range   Microalb, Ur Waived 10 0 - 19 mg/L   Creatinine, Urine Waived 50 10 - 300 mg/dL   Microalb/Creat Ratio 30-300 (H) <30 mg/g  Comprehensive metabolic panel  Result Value Ref Range   Glucose 107 (H) 65 - 99 mg/dL   BUN 7 (L) 8 - 27 mg/dL   Creatinine, Ser 0.79 0.76 - 1.27 mg/dL   GFR calc non Af Amer 82 >59 mL/min/1.73   GFR calc Af Amer 95 >59 mL/min/1.73   BUN/Creatinine Ratio 9 (L) 10 - 24   Sodium 145 (H) 134 - 144 mmol/L   Potassium 3.9 3.5 - 5.2 mmol/L   Chloride 104 96 - 106 mmol/L   CO2 25 20 - 29 mmol/L   Calcium 9.2 8.6 - 10.2 mg/dL   Total Protein 7.0 6.0 - 8.5  g/dL   Albumin 4.4 3.6 - 4.6 g/dL   Globulin, Total 2.6 1.5 - 4.5 g/dL   Albumin/Globulin Ratio 1.7 1.2 - 2.2   Bilirubin Total 0.5 0.0 - 1.2 mg/dL   Alkaline Phosphatase 78 44 - 121 IU/L   AST 16 0 - 40 IU/L   ALT 17 0 - 44 IU/L  CBC with Differential/Platelet  Result Value Ref Range   WBC 6.2 3.4 - 10.8 x10E3/uL   RBC 4.41 4.14 - 5.80 x10E6/uL   Hemoglobin 14.5 13.0 - 17.7 g/dL   Hematocrit 42.1 37.5 - 51.0 %   MCV 96 79 - 97 fL   MCH 32.9 26.6 - 33.0 pg  MCHC 34.4 31.5 - 35.7 g/dL   RDW 12.2 11.6 - 15.4 %   Platelets 138 (L) 150 - 450 x10E3/uL   Neutrophils 56 Not Estab. %   Lymphs 31 Not Estab. %   Monocytes 8 Not Estab. %   Eos 4 Not Estab. %   Basos 1 Not Estab. %   Neutrophils Absolute 3.6 1.4 - 7.0 x10E3/uL   Lymphocytes Absolute 1.9 0.7 - 3.1 x10E3/uL   Monocytes Absolute 0.5 0.1 - 0.9 x10E3/uL   EOS (ABSOLUTE) 0.2 0.0 - 0.4 x10E3/uL   Basophils Absolute 0.0 0.0 - 0.2 x10E3/uL   Immature Granulocytes 0 Not Estab. %   Immature Grans (Abs) 0.0 0.0 - 0.1 x10E3/uL  Lipid Panel w/o Chol/HDL Ratio  Result Value Ref Range   Cholesterol, Total 145 100 - 199 mg/dL   Triglycerides 74 0 - 149 mg/dL   HDL 73 >39 mg/dL   VLDL Cholesterol Cal 14 5 - 40 mg/dL   LDL Chol Calc (NIH) 58 0 - 99 mg/dL  TSH  Result Value Ref Range   TSH 4.190 0.450 - 4.500 uIU/mL  Urinalysis, Routine w reflex microscopic  Result Value Ref Range   Specific Gravity, UA 1.015 1.005 - 1.030   pH, UA 5.5 5.0 - 7.5   Color, UA Yellow Yellow   Appearance Ur Clear Clear   Leukocytes,UA Negative Negative   Protein,UA Negative Negative/Trace   Glucose, UA Negative Negative   Ketones, UA Negative Negative   RBC, UA Negative Negative   Bilirubin, UA Negative Negative   Urobilinogen, Ur 0.2 0.2 - 1.0 mg/dL   Nitrite, UA Negative Negative      Assessment & Plan:   Problem List Items Addressed This Visit      Cardiovascular and Mediastinum   AAA (abdominal aortic aneurysm) without rupture (HCC)     Continue to follow with vascular and cardiology. Continue to monitor. Call with any concerns. Continue to monitor.       Relevant Medications   lisinopril (ZESTRIL) 5 MG tablet   atorvastatin (LIPITOR) 80 MG tablet   metoprolol succinate (TOPROL-XL) 25 MG 24 hr tablet   Other Relevant Orders   Comprehensive metabolic panel (Completed)   CBC with Differential/Platelet (Completed)   CAD (coronary artery disease)    Continue to follow with vascular and cardiology. Continue to monitor. Call with any concerns. Continue to monitor.       Relevant Medications   lisinopril (ZESTRIL) 5 MG tablet   atorvastatin (LIPITOR) 80 MG tablet   metoprolol succinate (TOPROL-XL) 25 MG 24 hr tablet   Other Relevant Orders   Comprehensive metabolic panel (Completed)   CBC with Differential/Platelet (Completed)   HFrEF (heart failure with reduced ejection fraction) ([place])    Continue to follow with vascular and cardiology. Continue to monitor. Call with any concerns. Continue to monitor.       Relevant Medications   lisinopril (ZESTRIL) 5 MG tablet   atorvastatin (LIPITOR) 80 MG tablet   metoprolol succinate (TOPROL-XL) 25 MG 24 hr tablet   Other Relevant Orders   Comprehensive metabolic panel (Completed)   CBC with Differential/Platelet (Completed)     Genitourinary   Benign hypertensive renal disease    Under good control on current regimen. Continue current regimen. Continue to monitor. Call with any concerns. Refills given. Labs drawn today.        Relevant Orders   Microalbumin, Urine Waived (Completed)   Comprehensive metabolic panel (Completed)   CBC with Differential/Platelet (Completed)  Urinalysis, Routine w reflex microscopic (Completed)     Other   Anxiety and depression    Continue to follow with vascular and cardiology. Continue to monitor. Call with any concerns. Continue to monitor.       Relevant Orders   Comprehensive metabolic panel (Completed)   CBC with  Differential/Platelet (Completed)   TSH (Completed)   Mixed hyperlipidemia    Continue to follow with vascular and cardiology. Continue to monitor. Call with any concerns. Continue to monitor.       Relevant Medications   lisinopril (ZESTRIL) 5 MG tablet   atorvastatin (LIPITOR) 80 MG tablet   metoprolol succinate (TOPROL-XL) 25 MG 24 hr tablet   Other Relevant Orders   Comprehensive metabolic panel (Completed)   CBC with Differential/Platelet (Completed)   Lipid Panel w/o Chol/HDL Ratio (Completed)   Non-recurrent unilateral inguinal hernia without obstruction or gangrene    Not causing pain right now. Would like to hold on seeing surgery at this time. Call with any concerns or if getting worse.        Other Visit Diagnoses    Routine general medical examination at a health care facility    -  Primary   Vaccines up to date. Screening labs checked today. Continue diet and exercise. Call with any concerns. Continue to monitor.    Open wound of right lower leg, initial encounter       Consistent with scratch. Healing well. Due for Td- given today.   Relevant Orders   Td : Tetanus/diphtheria >7yo Preservative  free (Completed)       LABORATORY TESTING:  Health maintenance labs ordered today as discussed above.   IMMUNIZATIONS:   - Tdap: Tetanus vaccination status reviewed: Td vaccination indicated and given today. - Influenza: Up to date - Pneumovax: Up to date - Prevnar: Up to date - HPV: Up to date  SCREENING: - Colonoscopy: Not applicable  Discussed with patient purpose of the colonoscopy is to detect colon cancer at curable precancerous or early stages   PATIENT COUNSELING:    Sexuality: Discussed sexually transmitted diseases, partner selection, use of condoms, avoidance of unintended pregnancy  and contraceptive alternatives.   Advised to avoid cigarette smoking.  I discussed with the patient that most people either abstain from alcohol or drink within safe limits  (<=14/week and <=4 drinks/occasion for males, <=7/weeks and <= 3 drinks/occasion for females) and that the risk for alcohol disorders and other health effects rises proportionally with the number of drinks per week and how often a drinker exceeds daily limits.  Discussed cessation/primary prevention of drug use and availability of treatment for abuse.   Diet: Encouraged to adjust caloric intake to maintain  or achieve ideal body weight, to reduce intake of dietary saturated fat and total fat, to limit sodium intake by avoiding high sodium foods and not adding table salt, and to maintain adequate dietary potassium and calcium preferably from fresh fruits, vegetables, and low-fat dairy products.    stressed the importance of regular exercise  Injury prevention: Discussed safety belts, safety helmets, smoke detector, smoking near bedding or upholstery.   Dental health: Discussed importance of regular tooth brushing, flossing, and dental visits.   Follow up plan: NEXT PREVENTATIVE PHYSICAL DUE IN 1 YEAR. Return in about 6 months (around 06/14/2021).

## 2020-12-16 LAB — CBC WITH DIFFERENTIAL/PLATELET
Basophils Absolute: 0 10*3/uL (ref 0.0–0.2)
Basos: 1 %
EOS (ABSOLUTE): 0.2 10*3/uL (ref 0.0–0.4)
Eos: 4 %
Hematocrit: 42.1 % (ref 37.5–51.0)
Hemoglobin: 14.5 g/dL (ref 13.0–17.7)
Immature Grans (Abs): 0 10*3/uL (ref 0.0–0.1)
Immature Granulocytes: 0 %
Lymphocytes Absolute: 1.9 10*3/uL (ref 0.7–3.1)
Lymphs: 31 %
MCH: 32.9 pg (ref 26.6–33.0)
MCHC: 34.4 g/dL (ref 31.5–35.7)
MCV: 96 fL (ref 79–97)
Monocytes Absolute: 0.5 10*3/uL (ref 0.1–0.9)
Monocytes: 8 %
Neutrophils Absolute: 3.6 10*3/uL (ref 1.4–7.0)
Neutrophils: 56 %
Platelets: 138 10*3/uL — ABNORMAL LOW (ref 150–450)
RBC: 4.41 x10E6/uL (ref 4.14–5.80)
RDW: 12.2 % (ref 11.6–15.4)
WBC: 6.2 10*3/uL (ref 3.4–10.8)

## 2020-12-16 LAB — COMPREHENSIVE METABOLIC PANEL
ALT: 17 IU/L (ref 0–44)
AST: 16 IU/L (ref 0–40)
Albumin/Globulin Ratio: 1.7 (ref 1.2–2.2)
Albumin: 4.4 g/dL (ref 3.6–4.6)
Alkaline Phosphatase: 78 IU/L (ref 44–121)
BUN/Creatinine Ratio: 9 — ABNORMAL LOW (ref 10–24)
BUN: 7 mg/dL — ABNORMAL LOW (ref 8–27)
Bilirubin Total: 0.5 mg/dL (ref 0.0–1.2)
CO2: 25 mmol/L (ref 20–29)
Calcium: 9.2 mg/dL (ref 8.6–10.2)
Chloride: 104 mmol/L (ref 96–106)
Creatinine, Ser: 0.79 mg/dL (ref 0.76–1.27)
GFR calc Af Amer: 95 mL/min/{1.73_m2} (ref >59)
GFR calc non Af Amer: 82 mL/min/{1.73_m2} (ref >59)
Globulin, Total: 2.6 g/dL (ref 1.5–4.5)
Glucose: 107 mg/dL — ABNORMAL HIGH (ref 65–99)
Potassium: 3.9 mmol/L (ref 3.5–5.2)
Sodium: 145 mmol/L — ABNORMAL HIGH (ref 134–144)
Total Protein: 7 g/dL (ref 6.0–8.5)

## 2020-12-16 LAB — TSH: TSH: 4.19 u[IU]/mL (ref 0.450–4.500)

## 2020-12-16 LAB — LIPID PANEL W/O CHOL/HDL RATIO
Cholesterol, Total: 145 mg/dL (ref 100–199)
HDL: 73 mg/dL (ref >39)
LDL Chol Calc (NIH): 58 mg/dL (ref 0–99)
Triglycerides: 74 mg/dL (ref 0–149)
VLDL Cholesterol Cal: 14 mg/dL (ref 5–40)

## 2020-12-19 ENCOUNTER — Encounter: Payer: Self-pay | Admitting: Family Medicine

## 2020-12-20 ENCOUNTER — Encounter: Payer: Self-pay | Admitting: Family Medicine

## 2020-12-20 NOTE — Assessment & Plan Note (Signed)
Continue to follow with vascular and cardiology. Continue to monitor. Call with any concerns. Continue to monitor.  

## 2020-12-20 NOTE — Assessment & Plan Note (Signed)
Not causing pain right now. Would like to hold on seeing surgery at this time. Call with any concerns or if getting worse.

## 2020-12-20 NOTE — Assessment & Plan Note (Signed)
Continue to follow with vascular and cardiology. Continue to monitor. Call with any concerns. Continue to monitor.

## 2020-12-20 NOTE — Assessment & Plan Note (Signed)
Under good control on current regimen. Continue current regimen. Continue to monitor. Call with any concerns. Refills given. Labs drawn today.   

## 2021-01-03 DIAGNOSIS — G894 Chronic pain syndrome: Secondary | ICD-10-CM | POA: Diagnosis not present

## 2021-01-03 DIAGNOSIS — R69 Illness, unspecified: Secondary | ICD-10-CM | POA: Diagnosis not present

## 2021-01-03 DIAGNOSIS — M545 Low back pain, unspecified: Secondary | ICD-10-CM | POA: Diagnosis not present

## 2021-01-10 DIAGNOSIS — G8929 Other chronic pain: Secondary | ICD-10-CM | POA: Diagnosis not present

## 2021-01-10 DIAGNOSIS — I2510 Atherosclerotic heart disease of native coronary artery without angina pectoris: Secondary | ICD-10-CM | POA: Diagnosis not present

## 2021-01-10 DIAGNOSIS — I110 Hypertensive heart disease with heart failure: Secondary | ICD-10-CM | POA: Diagnosis not present

## 2021-01-10 DIAGNOSIS — Z008 Encounter for other general examination: Secondary | ICD-10-CM | POA: Diagnosis not present

## 2021-01-10 DIAGNOSIS — I509 Heart failure, unspecified: Secondary | ICD-10-CM | POA: Diagnosis not present

## 2021-01-10 DIAGNOSIS — I252 Old myocardial infarction: Secondary | ICD-10-CM | POA: Diagnosis not present

## 2021-01-10 DIAGNOSIS — M545 Low back pain, unspecified: Secondary | ICD-10-CM | POA: Diagnosis not present

## 2021-01-10 DIAGNOSIS — E785 Hyperlipidemia, unspecified: Secondary | ICD-10-CM | POA: Diagnosis not present

## 2021-01-10 DIAGNOSIS — K219 Gastro-esophageal reflux disease without esophagitis: Secondary | ICD-10-CM | POA: Diagnosis not present

## 2021-01-10 DIAGNOSIS — R69 Illness, unspecified: Secondary | ICD-10-CM | POA: Diagnosis not present

## 2021-01-30 ENCOUNTER — Other Ambulatory Visit: Payer: Self-pay | Admitting: Family Medicine

## 2021-02-01 DIAGNOSIS — I700 Atherosclerosis of aorta: Secondary | ICD-10-CM | POA: Insufficient documentation

## 2021-03-22 DIAGNOSIS — F1120 Opioid dependence, uncomplicated: Secondary | ICD-10-CM | POA: Diagnosis not present

## 2021-03-22 DIAGNOSIS — Z79891 Long term (current) use of opiate analgesic: Secondary | ICD-10-CM | POA: Diagnosis not present

## 2021-03-28 DIAGNOSIS — R69 Illness, unspecified: Secondary | ICD-10-CM | POA: Diagnosis not present

## 2021-03-28 DIAGNOSIS — G894 Chronic pain syndrome: Secondary | ICD-10-CM | POA: Diagnosis not present

## 2021-03-28 DIAGNOSIS — M545 Low back pain, unspecified: Secondary | ICD-10-CM | POA: Diagnosis not present

## 2021-05-02 ENCOUNTER — Other Ambulatory Visit: Payer: Self-pay | Admitting: Family Medicine

## 2021-05-09 DIAGNOSIS — G894 Chronic pain syndrome: Secondary | ICD-10-CM | POA: Diagnosis not present

## 2021-05-09 DIAGNOSIS — R69 Illness, unspecified: Secondary | ICD-10-CM | POA: Diagnosis not present

## 2021-05-09 DIAGNOSIS — M545 Low back pain, unspecified: Secondary | ICD-10-CM | POA: Diagnosis not present

## 2021-06-15 ENCOUNTER — Encounter: Payer: Self-pay | Admitting: Family Medicine

## 2021-06-15 ENCOUNTER — Other Ambulatory Visit: Payer: Self-pay

## 2021-06-15 ENCOUNTER — Ambulatory Visit (INDEPENDENT_AMBULATORY_CARE_PROVIDER_SITE_OTHER): Payer: Medicare HMO | Admitting: Family Medicine

## 2021-06-15 VITALS — BP 119/73 | HR 59 | Temp 98.2°F | Ht 67.0 in | Wt 132.2 lb

## 2021-06-15 DIAGNOSIS — I129 Hypertensive chronic kidney disease with stage 1 through stage 4 chronic kidney disease, or unspecified chronic kidney disease: Secondary | ICD-10-CM

## 2021-06-15 DIAGNOSIS — I2510 Atherosclerotic heart disease of native coronary artery without angina pectoris: Secondary | ICD-10-CM

## 2021-06-15 DIAGNOSIS — E782 Mixed hyperlipidemia: Secondary | ICD-10-CM | POA: Diagnosis not present

## 2021-06-15 MED ORDER — NITROGLYCERIN 0.4 MG SL SUBL: SUBLINGUAL_TABLET | SUBLINGUAL | 12 refills | Status: DC

## 2021-06-15 MED ORDER — LISINOPRIL 5 MG PO TABS: 5.0000 mg | ORAL_TABLET | Freq: Every day | ORAL | 1 refills | Status: DC

## 2021-06-15 MED ORDER — METOPROLOL SUCCINATE ER 25 MG PO TB24: 25.0000 mg | ORAL_TABLET | Freq: Every day | ORAL | 1 refills | Status: DC

## 2021-06-15 MED ORDER — ATORVASTATIN CALCIUM 80 MG PO TABS: ORAL_TABLET | ORAL | 1 refills | Status: DC

## 2021-06-15 MED ORDER — CLOPIDOGREL BISULFATE 75 MG PO TABS: 75.0000 mg | ORAL_TABLET | Freq: Every day | ORAL | 3 refills | Status: DC

## 2021-06-15 NOTE — Assessment & Plan Note (Signed)
Under good control on current regimen. Continue current regimen. Continue to monitor. Call with any concerns. Refills given. Labs drawn today.   

## 2021-06-15 NOTE — Progress Notes (Signed)
BP 119/73   Pulse (!) 59   Temp 98.2 F (36.8 C) (Oral)   Ht '5\' 7"'$  (1.702 m)   Wt 132 lb 3.2 oz (60 kg)   SpO2 97%   BMI 20.71 kg/m    Subjective:    Patient ID: Brian [name], male    DOB: 08-01-1933, 87 y.o.   MRN: [id number]  HPI: Brian [name] is a 87 y.o. male  Chief Complaint  Patient presents with   Hypertension   Hyperlipidemia    HYPERTENSION / [place] Satisfied with current treatment? yes Duration of hypertension: chronic BP monitoring frequency: not checking BP medication side effects: no Past BP meds: metoprolol, lisinopril Duration of hyperlipidemia: chronic Cholesterol medication side effects: no Cholesterol supplements: fish oil Past cholesterol medications: atorvastatin Medication compliance: excellent compliance Aspirin: no Recent stressors: no Recurrent headaches: no Visual changes: no Palpitations: no Dyspnea: no Chest pain: no Lower extremity edema: no Dizzy/lightheaded: no   Relevant past medical, surgical, family and social history reviewed and updated as indicated. Interim medical history since our last visit reviewed. Allergies and medications reviewed and updated.  Review of Systems  Constitutional: Negative.   Respiratory: Negative.    Cardiovascular: Negative.   Gastrointestinal: Negative.   Musculoskeletal: Negative.   Psychiatric/Behavioral: Negative.     Per HPI unless specifically indicated above     Objective:    BP 119/73   Pulse (!) 59   Temp 98.2 F (36.8 C) (Oral)   Ht '5\' 7"'$  (1.702 m)   Wt 132 lb 3.2 oz (60 kg)   SpO2 97%   BMI 20.71 kg/m   Wt Readings from Last 3 Encounters:  06/15/21 132 lb 3.2 oz (60 kg)  12/15/20 130 lb 6.4 oz (59.1 kg)  08/26/20 132 lb (59.9 kg)    Physical Exam Vitals and nursing note reviewed.  Constitutional:      General: He is not in acute distress.    Appearance: Normal appearance. He is not ill-appearing, toxic-appearing or diaphoretic.  HENT:     Head:  Normocephalic and atraumatic.     Right Ear: External ear normal.     Left Ear: External ear normal.     Nose: Nose normal.     Mouth/Throat:     Mouth: Mucous membranes are moist.     Pharynx: Oropharynx is clear.  Eyes:     General: No scleral icterus.       Right eye: No discharge.        Left eye: No discharge.     Extraocular Movements: Extraocular movements intact.     Conjunctiva/sclera: Conjunctivae normal.     Pupils: Pupils are equal, round, and reactive to light.  Cardiovascular:     Rate and Rhythm: Normal rate and regular rhythm.     Pulses: Normal pulses.     Heart sounds: Normal heart sounds. No murmur heard.   No friction rub. No gallop.  Pulmonary:     Effort: Pulmonary effort is normal. No respiratory distress.     Breath sounds: Normal breath sounds. No stridor. No wheezing, rhonchi or rales.  Chest:     Chest wall: No tenderness.  Musculoskeletal:        General: Normal range of motion.     Cervical back: Normal range of motion and neck supple.  Skin:    General: Skin is warm and dry.     Capillary Refill: Capillary refill takes less than 2 seconds.     Coloration: Skin  is not jaundiced or pale.     Findings: No bruising, erythema, lesion or rash.  Neurological:     General: No focal deficit present.     Mental Status: He is alert and oriented to person, place, and time. Mental status is at baseline.  Psychiatric:        Mood and Affect: Mood normal.        Behavior: Behavior normal.        Thought Content: Thought content normal.        Judgment: Judgment normal.    Results for orders placed or performed in visit on 12/15/20  Microalbumin, Urine Waived  Result Value Ref Range   Microalb, Ur Waived 10 0 - 19 mg/L   Creatinine, Urine Waived 50 10 - 300 mg/dL   Microalb/Creat Ratio 30-300 (H) <30 mg/g  Comprehensive metabolic panel  Result Value Ref Range   Glucose 107 (H) 65 - 99 mg/dL   BUN 7 (L) 8 - 27 mg/dL   Creatinine, Ser 0.79 0.76 - 1.27  mg/dL   GFR calc non Af Amer 82 >59 mL/min/1.73   GFR calc Af Amer 95 >59 mL/min/1.73   BUN/Creatinine Ratio 9 (L) 10 - 24   Sodium 145 (H) 134 - 144 mmol/L   Potassium 3.9 3.5 - 5.2 mmol/L   Chloride 104 96 - 106 mmol/L   CO2 25 20 - 29 mmol/L   Calcium 9.2 8.6 - 10.2 mg/dL   Total Protein 7.0 6.0 - 8.5 g/dL   Albumin 4.4 3.6 - 4.6 g/dL   Globulin, Total 2.6 1.5 - 4.5 g/dL   Albumin/Globulin Ratio 1.7 1.2 - 2.2   Bilirubin Total 0.5 0.0 - 1.2 mg/dL   Alkaline Phosphatase 78 44 - 121 IU/L   AST 16 0 - 40 IU/L   ALT 17 0 - 44 IU/L  CBC with Differential/Platelet  Result Value Ref Range   WBC 6.2 3.4 - 10.8 x10E3/uL   RBC 4.41 4.14 - 5.80 x10E6/uL   Hemoglobin 14.5 13.0 - 17.7 g/dL   Hematocrit 42.1 37.5 - 51.0 %   MCV 96 79 - 97 fL   MCH 32.9 26.6 - 33.0 pg   MCHC 34.4 31.5 - 35.7 g/dL   RDW 12.2 11.6 - 15.4 %   Platelets 138 (L) 150 - 450 x10E3/uL   Neutrophils 56 Not Estab. %   Lymphs 31 Not Estab. %   Monocytes 8 Not Estab. %   Eos 4 Not Estab. %   Basos 1 Not Estab. %   Neutrophils Absolute 3.6 1.4 - 7.0 x10E3/uL   Lymphocytes Absolute 1.9 0.7 - 3.1 x10E3/uL   Monocytes Absolute 0.5 0.1 - 0.9 x10E3/uL   EOS (ABSOLUTE) 0.2 0.0 - 0.4 x10E3/uL   Basophils Absolute 0.0 0.0 - 0.2 x10E3/uL   Immature Granulocytes 0 Not Estab. %   Immature Grans (Abs) 0.0 0.0 - 0.1 x10E3/uL  Lipid Panel w/o Chol/HDL Ratio  Result Value Ref Range   Cholesterol, Total 145 100 - 199 mg/dL   Triglycerides 74 0 - 149 mg/dL   HDL 73 >39 mg/dL   VLDL Cholesterol Cal 14 5 - 40 mg/dL   LDL Chol Calc (NIH) 58 0 - 99 mg/dL  TSH  Result Value Ref Range   TSH 4.190 0.450 - 4.500 uIU/mL  Urinalysis, Routine w reflex microscopic  Result Value Ref Range   Specific Gravity, UA 1.015 1.005 - 1.030   pH, UA 5.5 5.0 - 7.5   Color, UA  Yellow Yellow   Appearance Ur Clear Clear   Leukocytes,UA Negative Negative   Protein,UA Negative Negative/Trace   Glucose, UA Negative Negative   Ketones, UA Negative  Negative   RBC, UA Negative Negative   Bilirubin, UA Negative Negative   Urobilinogen, Ur 0.2 0.2 - 1.0 mg/dL   Nitrite, UA Negative Negative      Assessment & Plan:   Problem List Items Addressed This Visit       Cardiovascular and Mediastinum   CAD (coronary artery disease)    Continue to follow with cardiology. Call with any concerns.        Relevant Medications   atorvastatin (LIPITOR) 80 MG tablet   lisinopril (ZESTRIL) 5 MG tablet   metoprolol succinate (TOPROL-XL) 25 MG 24 hr tablet   nitroGLYCERIN (NITROSTAT) 0.4 MG SL tablet     Genitourinary   Benign hypertensive renal disease - Primary    Under good control on current regimen. Continue current regimen. Continue to monitor. Call with any concerns. Refills given. Labs drawn today.        Relevant Orders   Comprehensive metabolic panel     Other   Mixed hyperlipidemia    Under good control on current regimen. Continue current regimen. Continue to monitor. Call with any concerns. Refills given. Labs drawn today.       Relevant Medications   atorvastatin (LIPITOR) 80 MG tablet   lisinopril (ZESTRIL) 5 MG tablet   metoprolol succinate (TOPROL-XL) 25 MG 24 hr tablet   nitroGLYCERIN (NITROSTAT) 0.4 MG SL tablet   Other Relevant Orders   Comprehensive metabolic panel   Lipid Panel w/o Chol/HDL Ratio     Follow up plan: Return in about 6 months (around 12/16/2021) for physical.

## 2021-06-15 NOTE — Assessment & Plan Note (Signed)
Continue to follow with cardiology. Call with any concerns.  

## 2021-06-16 ENCOUNTER — Encounter: Payer: Self-pay | Admitting: Family Medicine

## 2021-06-16 LAB — COMPREHENSIVE METABOLIC PANEL
ALT: 15 IU/L (ref 0–44)
AST: 16 IU/L (ref 0–40)
Albumin/Globulin Ratio: 1.8 (ref 1.2–2.2)
Albumin: 4.4 g/dL (ref 3.6–4.6)
Alkaline Phosphatase: 89 IU/L (ref 44–121)
BUN/Creatinine Ratio: 10 (ref 10–24)
BUN: 8 mg/dL (ref 8–27)
Bilirubin Total: 0.5 mg/dL (ref 0.0–1.2)
CO2: 25 mmol/L (ref 20–29)
Calcium: 9.2 mg/dL (ref 8.6–10.2)
Chloride: 104 mmol/L (ref 96–106)
Creatinine, Ser: 0.83 mg/dL (ref 0.76–1.27)
Globulin, Total: 2.4 g/dL (ref 1.5–4.5)
Glucose: 113 mg/dL — ABNORMAL HIGH (ref 65–99)
Potassium: 4.3 mmol/L (ref 3.5–5.2)
Sodium: 145 mmol/L — ABNORMAL HIGH (ref 134–144)
Total Protein: 6.8 g/dL (ref 6.0–8.5)
eGFR: 85 mL/min/{1.73_m2} (ref >59)

## 2021-06-16 LAB — LIPID PANEL W/O CHOL/HDL RATIO
Cholesterol, Total: 138 mg/dL (ref 100–199)
HDL: 69 mg/dL (ref >39)
LDL Chol Calc (NIH): 56 mg/dL (ref 0–99)
Triglycerides: 63 mg/dL (ref 0–149)
VLDL Cholesterol Cal: 13 mg/dL (ref 5–40)

## 2021-06-27 ENCOUNTER — Ambulatory Visit (INDEPENDENT_AMBULATORY_CARE_PROVIDER_SITE_OTHER): Payer: Medicare HMO

## 2021-06-27 VITALS — Ht 68.0 in | Wt 130.0 lb

## 2021-06-27 DIAGNOSIS — Z0000 Encounter for general adult medical examination without abnormal findings: Secondary | ICD-10-CM

## 2021-06-27 NOTE — Patient Instructions (Signed)
Brian [name] , Thank you for taking time to come for your Medicare Wellness Visit. I appreciate your ongoing commitment to your health goals. Please review the following plan we discussed and let me know if I can assist you in the future.   Screening recommendations/referrals: Colonoscopy: not required Recommended yearly ophthalmology/optometry visit for glaucoma screening and checkup Recommended yearly dental visit for hygiene and checkup  Vaccinations: Influenza vaccine: due Pneumococcal vaccine: completed 12/25/2017 Tdap vaccine: completed 12/15/2020, due 12/15/2030 Shingles vaccine: discussed   Covid-19: 08/20/2020, 12/26/2019, 12/05/2019  Advanced directives: Please bring a copy of your POA (Power of Attorney) and/or Living Will to your next appointment.   Conditions/risks identified: smoking  Next appointment: Follow up in one year for your annual wellness visit.   Preventive Care 13 Years and Older, Male Preventive care refers to lifestyle choices and visits with your health care provider that can promote health and wellness. What does preventive care include? A yearly physical exam. This is also called an annual well check. Dental exams once or twice a year. Routine eye exams. Ask your health care provider how often you should have your eyes checked. Personal lifestyle choices, including: Daily care of your teeth and gums. Regular physical activity. Eating a healthy diet. Avoiding tobacco and drug use. Limiting alcohol use. Practicing safe sex. Taking low doses of aspirin every day. Taking vitamin and mineral supplements as recommended by your health care provider. What happens during an annual well check? The services and screenings done by your health care provider during your annual well check will depend on your age, overall health, lifestyle risk factors, and family history of disease. Counseling  Your health care provider may ask you questions about your: Alcohol  use. Tobacco use. Drug use. Emotional well-being. Home and relationship well-being. Sexual activity. Eating habits. History of falls. Memory and ability to understand (cognition). Work and work Statistician. Screening  You may have the following tests or measurements: Height, weight, and BMI. Blood pressure. Lipid and cholesterol levels. These may be checked every 5 years, or more frequently if you are over 66 years old. Skin check. Lung cancer screening. You may have this screening every year starting at age 62 if you have a 30-pack-year history of smoking and currently smoke or have quit within the past 15 years. Fecal occult blood test (FOBT) of the stool. You may have this test every year starting at age 51. Flexible sigmoidoscopy or colonoscopy. You may have a sigmoidoscopy every 5 years or a colonoscopy every 10 years starting at age 47. Prostate cancer screening. Recommendations will vary depending on your family history and other risks. Hepatitis C blood test. Hepatitis B blood test. Sexually transmitted disease (STD) testing. Diabetes screening. This is done by checking your blood sugar (glucose) after you have not eaten for a while (fasting). You may have this done every 1-3 years. Abdominal aortic aneurysm (AAA) screening. You may need this if you are a current or former smoker. Osteoporosis. You may be screened starting at age 34 if you are at high risk. Talk with your health care provider about your test results, treatment options, and if necessary, the need for more tests. Vaccines  Your health care provider may recommend certain vaccines, such as: Influenza vaccine. This is recommended every year. Tetanus, diphtheria, and acellular pertussis (Tdap, Td) vaccine. You may need a Td booster every 10 years. Zoster vaccine. You may need this after age 88. Pneumococcal 13-valent conjugate (PCV13) vaccine. One dose is recommended after age  65. Pneumococcal polysaccharide  (PPSV23) vaccine. One dose is recommended after age 34. Talk to your health care provider about which screenings and vaccines you need and how often you need them. This information is not intended to replace advice given to you by your health care provider. Make sure you discuss any questions you have with your health care provider. Document Released: 11/26/2015 Document Revised: 07/19/2016 Document Reviewed: 08/31/2015 Elsevier Interactive Patient Education  2017 [place] Prevention in the Home Falls can cause injuries. They can happen to people of all ages. There are many things you can do to make your home safe and to help prevent falls. What can I do on the outside of my home? Regularly fix the edges of walkways and driveways and fix any cracks. Remove anything that might make you trip as you walk through a door, such as a raised step or threshold. Trim any bushes or trees on the path to your home. Use bright outdoor lighting. Clear any walking paths of anything that might make someone trip, such as rocks or tools. Regularly check to see if handrails are loose or broken. Make sure that both sides of any steps have handrails. Any raised decks and porches should have guardrails on the edges. Have any leaves, snow, or ice cleared regularly. Use sand or salt on walking paths during winter. Clean up any spills in your garage right away. This includes oil or grease spills. What can I do in the bathroom? Use night lights. Install grab bars by the toilet and in the tub and shower. Do not use towel bars as grab bars. Use non-skid mats or decals in the tub or shower. If you need to sit down in the shower, use a plastic, non-slip stool. Keep the floor dry. Clean up any water that spills on the floor as soon as it happens. Remove soap buildup in the tub or shower regularly. Attach bath mats securely with double-sided non-slip rug tape. Do not have throw rugs and other things on the  floor that can make you trip. What can I do in the bedroom? Use night lights. Make sure that you have a light by your bed that is easy to reach. Do not use any sheets or blankets that are too big for your bed. They should not hang down onto the floor. Have a firm chair that has side arms. You can use this for support while you get dressed. Do not have throw rugs and other things on the floor that can make you trip. What can I do in the kitchen? Clean up any spills right away. Avoid walking on wet floors. Keep items that you use a lot in easy-to-reach places. If you need to reach something above you, use a strong step stool that has a grab bar. Keep electrical cords out of the way. Do not use floor polish or wax that makes floors slippery. If you must use wax, use non-skid floor wax. Do not have throw rugs and other things on the floor that can make you trip. What can I do with my stairs? Do not leave any items on the stairs. Make sure that there are handrails on both sides of the stairs and use them. Fix handrails that are broken or loose. Make sure that handrails are as long as the stairways. Check any carpeting to make sure that it is firmly attached to the stairs. Fix any carpet that is loose or worn. Avoid having throw rugs at  the top or bottom of the stairs. If you do have throw rugs, attach them to the floor with carpet tape. Make sure that you have a light switch at the top of the stairs and the bottom of the stairs. If you do not have them, ask someone to add them for you. What else can I do to help prevent falls? Wear shoes that: Do not have high heels. Have rubber bottoms. Are comfortable and fit you well. Are closed at the toe. Do not wear sandals. If you use a stepladder: Make sure that it is fully opened. Do not climb a closed stepladder. Make sure that both sides of the stepladder are locked into place. Ask someone to hold it for you, if possible. Clearly mark and make  sure that you can see: Any grab bars or handrails. First and last steps. Where the edge of each step is. Use tools that help you move around (mobility aids) if they are needed. These include: Canes. Walkers. Scooters. Crutches. Turn on the lights when you go into a dark area. Replace any light bulbs as soon as they burn out. Set up your furniture so you have a clear path. Avoid moving your furniture around. If any of your floors are uneven, fix them. If there are any pets around you, be aware of where they are. Review your medicines with your doctor. Some medicines can make you feel dizzy. This can increase your chance of falling. Ask your doctor what other things that you can do to help prevent falls. This information is not intended to replace advice given to you by your health care provider. Make sure you discuss any questions you have with your health care provider. Document Released: 08/26/2009 Document Revised: 04/06/2016 Document Reviewed: 12/04/2014 Elsevier Interactive Patient Education  2017 [employer].

## 2021-06-27 NOTE — Progress Notes (Signed)
I connected with Brian [name] today by telephone and verified that I am speaking with the correct person using two identifiers. Location patient: home Location provider: work Persons participating in the virtual visit: Brian [name], [name] LPN.   I discussed the limitations, risks, security and privacy concerns of performing an evaluation and management service by telephone and the availability of in person appointments. I also discussed with the patient that there may be a patient responsible charge related to this service. The patient expressed understanding and verbally consented to this telephonic visit.    Interactive audio and video telecommunications were attempted between this provider and patient, however failed, due to patient having technical difficulties OR patient did not have access to video capability.  We continued and completed visit with audio only.     Vital signs may be patient reported or missing.  Subjective:   Brian [name] is a 87 y.o. male who presents for Medicare Annual/Subsequent preventive examination.  Review of Systems     Cardiac Risk Factors include: advanced age (>39mn, >>54women);dyslipidemia;hypertension;male gender;smoking/ tobacco exposure     Objective:    Today's Vitals   06/27/21 0856  Weight: 130 lb (59 kg)  Height: '5\' 8"'$  (1.727 m)   Body mass index is 19.77 kg/m.  Advanced Directives 06/27/2021 06/25/2020 06/23/2019 06/19/2018 12/18/2016 10/29/2016 10/28/2016  Does Patient Have a Medical Advance Directive? Yes Yes Yes Yes Yes Yes Yes  Type of AParamed[place]of [place] will [place] Day[place] will Living will;Healthcare Power of Attorney Living will;Healthcare Power of Attorney Living will;Healthcare Power of [place]  Does patient want to make changes to medical advance directive? - - - - No - Patient declined  No - Patient declined No - Patient declined  Copy of [place] Chart? No - copy requested No - copy requested No - copy requested No - copy requested No - copy requested No - copy requested No - copy requested    Current Medications (verified) Outpatient Encounter Medications as of 06/27/2021  Medication Sig   atorvastatin (LIPITOR) 80 MG tablet TAKE 1 TABLET BY MOUTH ONCE DAILY AT  6PM   clopidogrel (PLAVIX) 75 MG tablet Take 1 tablet (75 mg total) by mouth daily.   diazepam (VALIUM) 5 MG tablet Take 5 mg by mouth every 8 (eight) hours as needed (for anxiety).    lisinopril (ZESTRIL) 5 MG tablet Take 1 tablet (5 mg total) by mouth daily.   metoprolol succinate (TOPROL-XL) 25 MG 24 hr tablet Take 1 tablet (25 mg total) by mouth daily.   nitroGLYCERIN (NITROSTAT) 0.4 MG SL tablet PLACE ONE TABLET UNDER THE TONGUE EVERY 5 MINUTES AS NEEDED FOR CHEST PAIN   traMADol (ULTRAM) 50 MG tablet Take 1 tablet (50 mg total) by mouth every 4 (four) hours as needed. (Patient taking differently: Take 50 mg by mouth every 4 (four) hours as needed (for pain).)   No facility-administered encounter medications on file as of 06/27/2021.    Allergies (verified) Zoloft [sertraline hcl]   History: Past Medical History:  Diagnosis Date   AAA (abdominal aortic aneurysm) (HCC)    2.9 CM infrarenal AAA on CT 11/2011   Acute anterior wall MI ([place] 05/29/2014   Alcoholism ([place]    Aneurysm of abdominal aorta (HCC)    SMALL   Anxiety    panic attacks, rare.   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    Gastritis 03/2012   GERD (gastroesophageal reflux disease)    GI bleed    HFrEF (heart failure with reduced ejection fraction) ([place])    HOH (hard of hearing)    Hyperlipemia    Hypertension    Hypovolemic shock ([place])     Kidney stones    Myocardial infarction ([place]) 2015   Panic attacks    Positive H. pylori titer ?, before 2010   treated with Abx.    Tubular adenoma of colon    Past Surgical History:  Procedure Laterality Date   CARDIAC CATHETERIZATION  2015   Stents   CATARACT EXTRACTION W/PHACO Right 09/21/2016   Procedure: CATARACT EXTRACTION PHACO AND INTRAOCULAR LENS PLACEMENT (IOC);  Surgeon: Eulogio Bear, MD;  Location: [facility] ORS;  Service: Ophthalmology;  Laterality: Right;  [place] 1.10 AP% 13.8 CDE 9.93 Fluid pack lot # [id number]   CATARACT EXTRACTION W/PHACO Left 12/07/2016   Procedure: CATARACT EXTRACTION PHACO AND INTRAOCULAR LENS PLACEMENT (IOC);  Surgeon: Eulogio Bear, MD;  Location: [facility] ORS;  Service: Ophthalmology;  Laterality: Left;  [place] 00:42.7 ap 13.7 cde 5.85 lot [id number]   COLONOSCOPY WITH PROPOFOL Left 06/06/2015   Procedure: COLONOSCOPY WITH PROPOFOL;  Surgeon: Hulen Luster, MD;  Location: [facility] ENDOSCOPY;  Service: Endoscopy;  Laterality: Left;   CORONARY ANGIOPLASTY     2015   LEFT HEART CATHETERIZATION WITH CORONARY ANGIOGRAM Bilateral 05/29/2014   Procedure: LEFT HEART CATHETERIZATION WITH CORONARY ANGIOGRAM;  Surgeon: Laverda Page, MD;  Location[facility] CATH LAB;  Service: Cardiovascular;  Laterality: Bilateral;   PERCUTANEOUS CORONARY STENT INTERVENTION (PCI-S)  05/29/2014   Procedure: PERCUTANEOUS CORONARY STENT INTERVENTION (PCI-S);  Surgeon: Laverda Page, MD;  Location: [facility] CATH LAB;  Service: Cardiovascular;;  DES x2 Prox and Mid LAD    Family History  Problem Relation Age of Onset   Heart disease Mother    Ulcers Father        stomach    Heart disease Father    Anuerysm Brother    Leukemia Brother    Hypertension Daughter    Heart disease Paternal Grandmother    Heart attack Paternal Grandmother    Heart disease Paternal Grandfather    Colon cancer Neg Hx    Social History   Socioeconomic History   Marital status: Married    Spouse name: Not on file    Number of children: 4   Years of education: Not on file   Highest education level: High school graduate  Occupational History   Occupation: semi retired  Tobacco Use   Smoking status: Some Days    Packs/day: 0.25    Years: 55.00    Pack years: 13.75    Types: Cigarettes   Smokeless tobacco: Never   Tobacco comments:    3-4 cigarettes daily   Vaping Use   Vaping Use: Never used  Substance and Sexual Activity   Alcohol use: No    Alcohol/week: 0.0 standard drinks   Drug use: No   Sexual activity: Not Currently  Other Topics Concern   Not on file  Social History Narrative   Working 3.5 hours a day 6 days a week in [employer]    Social Determinants of Health   Financial Resource Strain: Low Risk  Difficulty of Paying Living Expenses: Not hard at all  Food Insecurity: No Food Insecurity   Worried About [place] in the Last Year: Never true   Ran Out of Food in the Last Year: Never true  Transportation Needs: No Transportation Needs   Lack of Transportation (Medical): No   Lack of Transportation (Non-Medical): No  Physical Activity: Insufficiently Active   Days of Exercise per Week: 4 days   Minutes of Exercise per Session: 20 min  Stress: No Stress Concern Present   Feeling of Stress : Not at all  Social Connections: Not on file    Tobacco Counseling Ready to quit: Yes Counseling given: Not Answered Tobacco comments: 3-4 cigarettes daily    Clinical Intake:  Pre-visit preparation completed: Yes  Pain : No/denies pain     Nutritional Status: BMI of 19-24  Normal Nutritional Risks: None Diabetes: No  How often do you need to have someone help you when you read instructions, pamphlets, or other written materials from your doctor or pharmacy?: 1 - Never What is the last grade level you completed in school?: 12th grade  Diabetic? no  Interpreter Needed?: No  Information entered by :: NAllen LPN   Activities of Daily Living In your present  state of health, do you have any difficulty performing the following activities: 06/27/2021 12/15/2020  Hearing? Y Y  Comment has hearing aides -  Vision? N N  Difficulty concentrating or making decisions? Y Y  Comment somewhat short term -  Walking or climbing stairs? N N  Dressing or bathing? N N  Doing errands, shopping? N N  Preparing Food and eating ? N -  Using the Toilet? N -  In the past six months, have you accidently leaked urine? N -  Do you have problems with loss of bowel control? N -  Managing your Medications? N -  Managing your Finances? N -  Housekeeping or managing your Housekeeping? N -  Some recent data might be hidden    Patient Care Team: Valerie Roys, DO as PCP - General (Family Medicine) Pyrtle, Lajuan Lines, MD as Consulting Physician (Gastroenterology) Nathanial Rancher, MD as Referring Physician (Anesthesiology) Despina Hick, MD as Referring Physician (Cardiology)  Indicate any recent Medical Services you may have received from other than [facility] providers in the past year (date may be approximate).     Assessment:   This is a routine wellness examination for Elisio.  Hearing/Vision screen Vision Screening - Comments:: No regular eye [place]  Dietary issues and exercise activities discussed: Current Exercise Habits: Home exercise routine, Type of exercise: walking, Time (Minutes): 20, Frequency (Times/Week): 4, Weekly Exercise (Minutes/Week): 80   Goals Addressed             This Visit's Progress    Patient Stated       06/27/2021, quit smoking       Depression Screen PHQ 2/9 Scores 06/27/2021 06/15/2021 12/15/2020 06/25/2020 04/19/2020 10/02/2019 06/23/2019  PHQ - 2 Score 0 0 0 0 2 0 0  PHQ- 9 Score - 0 - - 4 0 -    Fall Risk Fall Risk  06/27/2021 12/15/2020 06/25/2020 04/19/2020 10/02/2019  Falls in the past year? 0 0 0 0 0  Number falls in past yr: - 0 - 0 0  Injury with Fall? - 0 - 0 0  Risk for fall due to : Medication side effect No  Fall Risks Medication side effect - -  Follow up Falls evaluation completed;Education provided;Falls prevention discussed Falls evaluation completed Education provided;Falls prevention discussed;Falls evaluation completed - -    FALL RISK PREVENTION PERTAINING TO THE HOME:  Any stairs in or around the home? Yes  If so, are there any without handrails? No  Home free of loose throw rugs in walkways, pet beds, electrical cords, etc? Yes  Adequate lighting in your home to reduce risk of falls? Yes   ASSISTIVE DEVICES UTILIZED TO PREVENT FALLS:  Life alert? No  Use of a cane, walker or w/c? No  Grab bars in the bathroom? Yes  Shower chair or bench in shower? No  Elevated toilet seat or a handicapped toilet? No   TIMED UP AND GO:  Was the test performed? No .      Cognitive Function:     6CIT Screen 06/27/2021 06/25/2020 06/19/2018 12/25/2017 12/18/2016  What Year? 0 points 0 points 0 points 0 points 0 points  What month? 0 points 0 points 0 points 0 points 0 points  What time? 0 points 0 points 0 points 0 points 0 points  Count back from 20 0 points 0 points 0 points 0 points 0 points  Months in reverse 0 points 0 points 0 points 0 points 0 points  Repeat phrase 0 points 2 points 4 points 0 points 0 points  Total Score 0 2 4 0 0    Immunizations Immunization History  Administered Date(s) Administered   Fluad Quad(high Dose 65+) 10/02/2019   Influenza, High Dose Seasonal PF 07/22/2018, 08/20/2020   Influenza-Unspecified 07/15/2015, 07/14/2017   PFIZER(Purple Top)SARS-COV-2 Vaccination 12/05/2019, 12/26/2019, 08/20/2020   Pneumococcal Conjugate-13 12/18/2016   Pneumococcal Polysaccharide-23 12/25/2017   Td 12/15/2020    TDAP status: Up to date  Flu Vaccine status: Due, Education has been provided regarding the importance of this vaccine. Advised may receive this vaccine at local pharmacy or Health Dept. Aware to provide a copy of the vaccination record if obtained from local  pharmacy or Health Dept. Verbalized acceptance and understanding.  Pneumococcal vaccine status: Up to date  Covid-19 vaccine status: Completed vaccines  Qualifies for Shingles Vaccine? Yes   Zostavax completed No   Shingrix Completed?: No.    Education has been provided regarding the importance of this vaccine. Patient has been advised to call insurance company to determine out of pocket expense if they have not yet received this vaccine. Advised may also receive vaccine at local pharmacy or Health Dept. Verbalized acceptance and understanding.  Screening Tests Health Maintenance  Topic Date Due   COVID-19 Vaccine (4 - Booster for Pfizer series) 12/21/2020   INFLUENZA VACCINE  06/13/2021   Zoster Vaccines- Shingrix (1 of 2) 09/15/2021 (Originally [birth year]1985)   TETANUS/TDAP  12/15/2030   PNA vac Low Risk Adult  Completed   HPV VACCINES  Aged Out    Health Maintenance  Health Maintenance Due  Topic Date Due   COVID-19 Vaccine (4 - Booster for Pfizer series) 12/21/2020   INFLUENZA VACCINE  06/13/2021    Colorectal cancer screening: No longer required.   Lung Cancer Screening: (Low Dose CT Chest recommended if Age 41-80 years, 30 pack-year currently smoking OR have quit w/in 15years.) does not qualify.   Lung Cancer Screening Referral: no  Additional Screening:  Hepatitis C Screening: does not qualify;  Vision Screening: Recommended annual ophthalmology exams for early detection of glaucoma and other disorders of the eye. Is the patient up to date with their annual eye exam?  No  Who  is the provider or what is the name of the office in which the patient attends annual eye exams? [facility] If pt is not established with a provider, would they like to be referred to a provider to establish care? No .   Dental Screening: Recommended annual dental exams for proper oral hygiene  Community Resource Referral / Chronic Care Management: CRR required this visit?  No   CCM  required this visit?  No      Plan:     I have personally reviewed and noted the following in the patient's chart:   Medical and social history Use of alcohol, tobacco or illicit drugs  Current medications and supplements including opioid prescriptions. Patient is currently taking opioid prescriptions. Information provided to patient regarding non-opioid alternatives. Patient advised to discuss non-opioid treatment plan with their provider. Functional ability and status Nutritional status Physical activity Advanced directives List of other physicians Hospitalizations, surgeries, and ER visits in previous 12 months Vitals Screenings to include cognitive, depression, and falls Referrals and appointments  In addition, I have reviewed and discussed with patient certain preventive protocols, quality metrics, and best practice recommendations. A written personalized care plan for preventive services as well as general preventive health recommendations were provided to patient.     Kellie Simmering, LPN   [id number]   Nurse Notes:

## 2021-08-01 ENCOUNTER — Other Ambulatory Visit: Payer: Self-pay | Admitting: Family Medicine

## 2021-08-01 DIAGNOSIS — R69 Illness, unspecified: Secondary | ICD-10-CM | POA: Diagnosis not present

## 2021-08-01 DIAGNOSIS — G894 Chronic pain syndrome: Secondary | ICD-10-CM | POA: Diagnosis not present

## 2021-08-01 DIAGNOSIS — M545 Low back pain, unspecified: Secondary | ICD-10-CM | POA: Diagnosis not present

## 2021-08-22 ENCOUNTER — Encounter: Payer: Self-pay | Admitting: Cardiology

## 2021-08-22 ENCOUNTER — Other Ambulatory Visit: Payer: Self-pay

## 2021-08-22 ENCOUNTER — Ambulatory Visit: Payer: Medicare HMO | Admitting: Cardiology

## 2021-08-22 VITALS — BP 134/83 | HR 63 | Temp 97.0°F | Resp 16 | Ht 68.0 in | Wt 132.0 lb

## 2021-08-22 DIAGNOSIS — I119 Hypertensive heart disease without heart failure: Secondary | ICD-10-CM | POA: Diagnosis not present

## 2021-08-22 DIAGNOSIS — R0609 Other forms of dyspnea: Secondary | ICD-10-CM | POA: Diagnosis not present

## 2021-08-22 DIAGNOSIS — R69 Illness, unspecified: Secondary | ICD-10-CM | POA: Diagnosis not present

## 2021-08-22 DIAGNOSIS — R0600 Dyspnea, unspecified: Secondary | ICD-10-CM | POA: Insufficient documentation

## 2021-08-22 DIAGNOSIS — F17210 Nicotine dependence, cigarettes, uncomplicated: Secondary | ICD-10-CM | POA: Diagnosis not present

## 2021-08-22 DIAGNOSIS — I7141 Pararenal abdominal aortic aneurysm, without rupture: Secondary | ICD-10-CM

## 2021-08-22 NOTE — Progress Notes (Signed)
Follow up visit  Subjective:   Brian [name], male    DOB: 22-Aug-1933, 87 y.o.   MRN: [id number]   Chief Complaint  Patient presents with   AAA   Coronary Artery Disease   Follow-up    1 year     HPI  87 y/o caucasian male with hypertension, hyperlipidemia, CAD h/o STEMI and ostial and mid LAD PCI 2015, AAA 2.9 cm (2013).  Patient is here with his wife today.  He does not have any chest pain complaints.  However, he has occasional episodes of shortness of breath, with "nervousness".  He does not have any significant shortness of breath with walking up to 1 mile or so.  Unfortunately he continues to smoke.  Blood pressure is elevated.   Current Outpatient Medications on File Prior to Visit  Medication Sig Dispense Refill   atorvastatin (LIPITOR) 80 MG tablet TAKE 1 TABLET BY MOUTH ONCE DAILY AT  6PM 90 tablet 1   clopidogrel (PLAVIX) 75 MG tablet Take 1 tablet (75 mg total) by mouth daily. 90 tablet 3   diazepam (VALIUM) 5 MG tablet Take 5 mg by mouth every 8 (eight) hours as needed (for anxiety).      lisinopril (ZESTRIL) 5 MG tablet Take 1 tablet by mouth once daily 90 tablet 0   metoprolol succinate (TOPROL-XL) 25 MG 24 hr tablet Take 1 tablet (25 mg total) by mouth daily. 90 tablet 1   nitroGLYCERIN (NITROSTAT) 0.4 MG SL tablet PLACE ONE TABLET UNDER THE TONGUE EVERY 5 MINUTES AS NEEDED FOR CHEST PAIN 25 tablet 12   traMADol (ULTRAM) 50 MG tablet Take 1 tablet (50 mg total) by mouth every 4 (four) hours as needed. (Patient taking differently: Take 50 mg by mouth every 4 (four) hours as needed (for pain).) 60 tablet 0   No current facility-administered medications on file prior to visit.    Cardiovascular studies:  EKG 08/22/2021: Sinus rhythm 73 bpm Od anteroseptal infarct Left axis deviation Low voltage  Abdominal Aortic Duplex  08/16/2020:  Dumbbell shaped proximal and mid abdominal aortic aneurysm.  Laminated  thrombus in mid aorta.  An abdominal aortic aneurysm  measuring 3.74 x 3.74 x 3.71 cm is seen.  Normal flow velocities noted in the abdominal aorta and iliac arteries.  No significant change from 09/05/2019. Recheck in 2 years. See attached  image.  Echocardiogram 10/29/2016: - Technically difficult study with poor images. Normal LV size with   EF 60-65%. Normal RV size and systolic function. No significant   valvular abnormalities.  Coronary angiography and intervention 2015: LM: Normal LAD: Ostial 80% stenosis-->successful but suboptimal stenting 3.0X12 mm promus Premier      Dissufe and aneurysmal mid vessel disease-->2.5X28 mm Promus Premier      Distap apical LAD embolization LCx: Prox 30-40%, mid ectatic 30-40% stenosis.  RCA: Dominant. Mid 20-30% stenosis. Mild diffuse disease with ectasia in mid to distal segement. RPL 10-15% stenosis.  CT abdomen w/contrast 2013: 1. Diverticulosis of the visualized colon.  2. Borderline infrarenal abdominal aortic aneurysm 2.9 cm, slightly increased  from  prior.  3. Left-sided nephrolithiasis, without hydronephrosis.    Recent labs: 06/15/2021: Glucose 113, BUN/Cr 8/0.83. EGFR 85. Na/K 145/4.3. Rest of the CMP normal HbA1C N/A Chol 138, TG 63, HDL 69, LDL 56 TSH 4.1 normal  12/2020: H/H 14/42. MCV 96. Platelets 138  04/19/2020: Glucose 118, BUN/Cr 7/0.81. EGFR 82. Na/K 143/4.5. Rest of the CMP normal H/H 13/39. MCV 98. Platelets 165 Chol 135,  TG 92, HDL 58, LDL 68 TSH 3.6 normal   Review of Systems  Cardiovascular:  Negative for chest pain, dyspnea on exertion, leg swelling, palpitations and syncope.        Vitals:   08/22/21 0915 08/22/21 0923  BP: (!) 196/90 134/83  Pulse: 63 63  Resp: 16   Temp: (!) 97 F (36.1 C)   SpO2: 94%      Body mass index is 20.07 kg/m. Filed Weights   08/22/21 0915  Weight: 132 lb (59.9 kg)    Objective:   Physical Exam Vitals and nursing note reviewed.  Constitutional:      General: He is not in acute distress. Neck:     Vascular:  No JVD.  Cardiovascular:     Rate and Rhythm: Normal rate and regular rhythm.     Heart sounds: Normal heart sounds. No murmur heard. Pulmonary:     Effort: Pulmonary effort is normal.     Breath sounds: Normal breath sounds. No wheezing or rales.        Assessment & Recommendations:   87 y/o caucasian male with hypertension, hyperlipidemia, CAD h/o STEMI and ostial and mid LAD PCI 2015, AAA 2.9 cm (2013).  CAD: Stable with no angina symptoms.  He has been on plavix without Aspirin with no bleeding issues. Continue the same, along with Lipitor 10, lisinopril 5 mg. Continue metoprolol succinate 25 mg,   AAA:  Will obtain aorta duplex to ensure stability  Dyspnea: Not elevated on exertion.  He is also a smoker.  Will obtain echocardiogram to rule out any significant LV dysfunction.  Tobacco cessation counseling: - Currently smoking 3-4 cigarettes/day   - Patient was informed of the dangers of tobacco abuse including stroke, cancer, and MI, as well as benefits of tobacco cessation. - Patient is unsure if he wants to quit at this time. - Approximately 5 mins were spent counseling patient cessation techniques. We discussed various methods to help quit smoking, including deciding on a date to quit, joining a support group, pharmacological agents. He is not willing to quit at this time.  - I will reassess his progress at the next follow-up visit  F/u in 1 year  Nigel Mormon, MD [facility] Pager: [phone] Office: [phone] If no answer Cell [phone]

## 2021-08-29 DIAGNOSIS — Z23 Encounter for immunization: Secondary | ICD-10-CM | POA: Diagnosis not present

## 2021-09-06 ENCOUNTER — Ambulatory Visit: Payer: Medicare HMO

## 2021-09-06 ENCOUNTER — Other Ambulatory Visit: Payer: Self-pay

## 2021-09-06 DIAGNOSIS — R0600 Dyspnea, unspecified: Secondary | ICD-10-CM

## 2021-09-06 DIAGNOSIS — I119 Hypertensive heart disease without heart failure: Secondary | ICD-10-CM

## 2021-09-06 DIAGNOSIS — I7141 Pararenal abdominal aortic aneurysm, without rupture: Secondary | ICD-10-CM

## 2021-09-06 DIAGNOSIS — I700 Atherosclerosis of aorta: Secondary | ICD-10-CM | POA: Diagnosis not present

## 2021-09-08 NOTE — Progress Notes (Signed)
Called and spoke with patient regarding his AA duplex results.

## 2021-09-08 NOTE — Progress Notes (Signed)
Called and spoke with patient regarding his echocardiogram results.  ?

## 2021-11-18 DIAGNOSIS — Z03818 Encounter for observation for suspected exposure to other biological agents ruled out: Secondary | ICD-10-CM | POA: Diagnosis not present

## 2021-11-18 DIAGNOSIS — Z20822 Contact with and (suspected) exposure to covid-19: Secondary | ICD-10-CM | POA: Diagnosis not present

## 2021-11-21 ENCOUNTER — Ambulatory Visit (INDEPENDENT_AMBULATORY_CARE_PROVIDER_SITE_OTHER): Payer: Medicare HMO | Admitting: Family Medicine

## 2021-11-21 ENCOUNTER — Other Ambulatory Visit: Payer: Self-pay

## 2021-11-21 ENCOUNTER — Encounter: Payer: Self-pay | Admitting: Family Medicine

## 2021-11-21 VITALS — BP 94/61 | HR 76 | Temp 97.7°F | Wt 124.0 lb

## 2021-11-21 DIAGNOSIS — R197 Diarrhea, unspecified: Secondary | ICD-10-CM | POA: Diagnosis not present

## 2021-11-21 MED ORDER — ONDANSETRON 4 MG PO TBDP: 4.0000 mg | ORAL_TABLET | Freq: Three times a day (TID) | ORAL | 0 refills | Status: DC | PRN

## 2021-11-21 MED ORDER — OMEPRAZOLE 20 MG PO CPDR: 20.0000 mg | DELAYED_RELEASE_CAPSULE | Freq: Every day | ORAL | 3 refills | Status: DC

## 2021-11-21 NOTE — Progress Notes (Signed)
BP 94/61    Pulse 76    Temp 97.7 F (36.5 C)    Wt 124 lb (56.2 kg)    SpO2 95%    BMI 18.85 kg/m    Subjective:    Patient ID: Brian [name], male    DOB: November 11, 1933, 88 y.o.   MRN: [id number]  HPI: Brian [name] is a 88 y.o. male  Chief Complaint  Patient presents with   Abdominal Pain    Patient states he has been having abdominal pain for about 5-6 days, has nausea and diarrhea. Patient states he does not have an appetite.    ABDOMINAL ISSUES Duration: 5-6 days Nature: sick on his stomach Severity: moderate  Frequency: intermittent- not eating Treatments attempted:  baking soda and water Constipation: no Diarrhea: yes Episodes of diarrhea/day: 4-5x a day Mucous in the stool: no Heartburn: no Bloating:no Flatulence: no Nausea: yes Vomiting: no Melena or hematochezia: no Rash: no Jaundice: no Fever: no Weight loss: yes  Relevant past medical, surgical, family and social history reviewed and updated as indicated. Interim medical history since our last visit reviewed. Allergies and medications reviewed and updated.  Review of Systems  Constitutional:  Positive for appetite change and fatigue. Negative for activity change, chills, diaphoresis, fever and unexpected weight change.  HENT: Negative.    Respiratory:  Positive for cough. Negative for apnea, choking, chest tightness, shortness of breath, wheezing and stridor.   Cardiovascular: Negative.   Gastrointestinal:  Positive for diarrhea and nausea. Negative for abdominal distention, abdominal pain, anal bleeding, blood in stool, constipation, rectal pain and vomiting.  Musculoskeletal: Negative.   Psychiatric/Behavioral: Negative.     Per HPI unless specifically indicated above     Objective:    BP 94/61    Pulse 76    Temp 97.7 F (36.5 C)    Wt 124 lb (56.2 kg)    SpO2 95%    BMI 18.85 kg/m   Wt Readings from Last 3 Encounters:  11/21/21 124 lb (56.2 kg)  08/22/21 132 lb (59.9 kg)  06/27/21 130 lb  (59 kg)    Physical Exam Vitals and nursing note reviewed.  Constitutional:      General: He is not in acute distress.    Appearance: Normal appearance. He is not ill-appearing, toxic-appearing or diaphoretic.  HENT:     Head: Normocephalic and atraumatic.     Right Ear: External ear normal.     Left Ear: External ear normal.     Nose: Nose normal.     Mouth/Throat:     Mouth: Mucous membranes are moist.     Pharynx: Oropharynx is clear.  Eyes:     General: No scleral icterus.       Right eye: No discharge.        Left eye: No discharge.     Extraocular Movements: Extraocular movements intact.     Conjunctiva/sclera: Conjunctivae normal.     Pupils: Pupils are equal, round, and reactive to light.  Cardiovascular:     Rate and Rhythm: Normal rate and regular rhythm.     Pulses: Normal pulses.     Heart sounds: Normal heart sounds. No murmur heard.   No friction rub. No gallop.  Pulmonary:     Effort: Pulmonary effort is normal. No respiratory distress.     Breath sounds: Normal breath sounds. No stridor. No wheezing, rhonchi or rales.  Chest:     Chest wall: No tenderness.  Musculoskeletal:  General: Normal range of motion.     Cervical back: Normal range of motion and neck supple.  Skin:    General: Skin is warm and dry.     Capillary Refill: Capillary refill takes less than 2 seconds.     Coloration: Skin is not jaundiced or pale.     Findings: No bruising, erythema, lesion or rash.  Neurological:     General: No focal deficit present.     Mental Status: He is alert and oriented to person, place, and time. Mental status is at baseline.  Psychiatric:        Mood and Affect: Mood normal.        Behavior: Behavior normal.        Thought Content: Thought content normal.        Judgment: Judgment normal.    Results for orders placed or performed in visit on 06/15/21  Comprehensive metabolic panel  Result Value Ref Range   Glucose 113 (H) 65 - 99 mg/dL   BUN 8 8  - 27 mg/dL   Creatinine, Ser 0.83 0.76 - 1.27 mg/dL   eGFR 85 >59 mL/min/1.73   BUN/Creatinine Ratio 10 10 - 24   Sodium 145 (H) 134 - 144 mmol/L   Potassium 4.3 3.5 - 5.2 mmol/L   Chloride 104 96 - 106 mmol/L   CO2 25 20 - 29 mmol/L   Calcium 9.2 8.6 - 10.2 mg/dL   Total Protein 6.8 6.0 - 8.5 g/dL   Albumin 4.4 3.6 - 4.6 g/dL   Globulin, Total 2.4 1.5 - 4.5 g/dL   Albumin/Globulin Ratio 1.8 1.2 - 2.2   Bilirubin Total 0.5 0.0 - 1.2 mg/dL   Alkaline Phosphatase 89 44 - 121 IU/L   AST 16 0 - 40 IU/L   ALT 15 0 - 44 IU/L  Lipid Panel w/o Chol/HDL Ratio  Result Value Ref Range   Cholesterol, Total 138 100 - 199 mg/dL   Triglycerides 63 0 - 149 mg/dL   HDL 69 >39 mg/dL   VLDL Cholesterol Cal 13 5 - 40 mg/dL   LDL Chol Calc (NIH) 56 0 - 99 mg/dL      Assessment & Plan:   Problem List Items Addressed This Visit   None Visit Diagnoses     Diarrhea, unspecified type    -  Primary   Will check labs and stool studies. Start omeprazole and zofran. Start [employer]. Recheck 39month Call with any concerns.    Relevant Orders   Comprehensive metabolic panel   TSH   Stool C-Diff Toxin Assay   Ova and parasite examination   Fecal leukocytes   Fecal occult blood, imunochemical([facility])   H. pylori antigen, stool   Novel Coronavirus, NAA ([facility])   CBC with Differential/Platelet        Follow up plan: Return As scheduled.

## 2021-11-22 ENCOUNTER — Ambulatory Visit: Payer: Self-pay | Admitting: *Deleted

## 2021-11-22 ENCOUNTER — Encounter: Payer: Self-pay | Admitting: Family Medicine

## 2021-11-22 LAB — CBC WITH DIFFERENTIAL/PLATELET
Basophils Absolute: 0 10*3/uL (ref 0.0–0.2)
Basos: 0 %
EOS (ABSOLUTE): 0 10*3/uL (ref 0.0–0.4)
Eos: 0 %
Hematocrit: 40.6 % (ref 37.5–51.0)
Hemoglobin: 14.3 g/dL (ref 13.0–17.7)
Immature Grans (Abs): 0 10*3/uL (ref 0.0–0.1)
Immature Granulocytes: 0 %
Lymphocytes Absolute: 1.5 10*3/uL (ref 0.7–3.1)
Lymphs: 19 %
MCH: 33.1 pg — ABNORMAL HIGH (ref 26.6–33.0)
MCHC: 35.2 g/dL (ref 31.5–35.7)
MCV: 94 fL (ref 79–97)
Monocytes Absolute: 0.6 10*3/uL (ref 0.1–0.9)
Monocytes: 8 %
Neutrophils Absolute: 5.9 10*3/uL (ref 1.4–7.0)
Neutrophils: 73 %
Platelets: 185 10*3/uL (ref 150–450)
RBC: 4.32 x10E6/uL (ref 4.14–5.80)
RDW: 13 % (ref 11.6–15.4)
WBC: 8.1 10*3/uL (ref 3.4–10.8)

## 2021-11-22 LAB — COMPREHENSIVE METABOLIC PANEL
ALT: 17 IU/L (ref 0–44)
AST: 27 IU/L (ref 0–40)
Albumin/Globulin Ratio: 1.5 (ref 1.2–2.2)
Albumin: 3.9 g/dL (ref 3.6–4.6)
Alkaline Phosphatase: 77 IU/L (ref 44–121)
BUN/Creatinine Ratio: 17 (ref 10–24)
BUN: 14 mg/dL (ref 8–27)
Bilirubin Total: 0.8 mg/dL (ref 0.0–1.2)
CO2: 25 mmol/L (ref 20–29)
Calcium: 8.9 mg/dL (ref 8.6–10.2)
Chloride: 98 mmol/L (ref 96–106)
Creatinine, Ser: 0.82 mg/dL (ref 0.76–1.27)
Globulin, Total: 2.6 g/dL (ref 1.5–4.5)
Glucose: 86 mg/dL (ref 70–99)
Potassium: 3.7 mmol/L (ref 3.5–5.2)
Sodium: 139 mmol/L (ref 134–144)
Total Protein: 6.5 g/dL (ref 6.0–8.5)
eGFR: 86 mL/min/{1.73_m2} (ref >59)

## 2021-11-22 LAB — TSH: TSH: 1.75 u[IU]/mL (ref 0.450–4.500)

## 2021-11-22 NOTE — Telephone Encounter (Signed)
Returned phone call to daughter and relayed Dr. Durenda Age message. Patient daughter verbalized understanding.

## 2021-11-22 NOTE — Telephone Encounter (Signed)
I returned pt's call to daughter Makayla Confer.  She called in earlier needing clarification on which BP medication her father was supposed to take.  The metoprolol 25 mg or the lisinopril 5 mg.   Dr. Wynetta Emery stopped one of them during his visit yesterday.   Reason for Disposition  [1] Caller has URGENT medicine question about med that PCP or specialist prescribed AND [2] triager unable to answer question  Answer Assessment - Initial Assessment Questions 1. NAME of MEDICATION: "What medicine are you calling about?"     I returned daughter's call.   The metoprolol and lisinopril 5 mg.  He was seen yesterday by Dr. Wynetta Emery and she stopped one of his BP medications because his BP was low due to the diarrhea. 2. QUESTION: "What is your question?" (e.g., double dose of medicine, side effect)     Which one should I stop?    3. PRESCRIBING HCP: "Who prescribed it?" Reason: if prescribed by specialist, call should be referred to that group.     Dr. Park Liter 4. SYMPTOMS: "Do you have any symptoms?"     N/A 5. SEVERITY: If symptoms are present, ask "Are they mild, moderate or severe?"     N/A 6. PREGNANCY:  "Is there any chance that you are pregnant?" "When was your last menstrual period?"     N/A  Protocols used: Medication Question Call-A-AH

## 2021-11-22 NOTE — Telephone Encounter (Signed)
We will discuss it at his follow up appt

## 2021-11-22 NOTE — Telephone Encounter (Signed)
°  Chief Complaint: Daughter Benjamine Mola needs to know when Dr. Wynetta Emery wants to resume the lisinopril. Symptoms: Pt having diarrhea and BP was low so Dr. Wynetta Emery has stopped the lisinopril for now. Frequency: N/A Pertinent Negatives: Patient denies N/A Disposition: [] ED /[] Urgent Care (no appt availability in office) / [] Appointment(In office/virtual)/ []  [facility]/ [] Home Care/ [] Refused Recommended Disposition /[] [place] [facility]/ [x]  Follow-up with PCP Additional Notes: I checked the chart and let Benjamine Mola know the lisinopril was the medication Dr. Wynetta Emery stopped for his BP but to continue the metoprolol.  She verbalized understanding. Benjamine Mola can be reached on her cell phone at [phone].

## 2021-11-23 DIAGNOSIS — R197 Diarrhea, unspecified: Secondary | ICD-10-CM | POA: Diagnosis not present

## 2021-11-23 LAB — SARS-COV-2, NAA 2 DAY TAT

## 2021-11-23 LAB — NOVEL CORONAVIRUS, NAA: SARS-CoV-2, NAA: NOT DETECTED

## 2021-11-24 ENCOUNTER — Other Ambulatory Visit: Payer: Self-pay | Admitting: Family Medicine

## 2021-11-24 DIAGNOSIS — R197 Diarrhea, unspecified: Secondary | ICD-10-CM | POA: Diagnosis not present

## 2021-11-25 LAB — FECAL OCCULT BLOOD, IMMUNOCHEMICAL: Fecal Occult Bld: NEGATIVE

## 2021-11-26 LAB — OVA AND PARASITE EXAMINATION

## 2021-11-26 LAB — FECAL LEUKOCYTES

## 2021-11-26 LAB — H. PYLORI ANTIGEN, STOOL: H pylori Ag, Stl: NEGATIVE

## 2021-11-26 LAB — CLOSTRIDIUM DIFFICILE EIA: C difficile Toxins A+B, EIA: NEGATIVE

## 2021-11-28 LAB — STOOL CULTURE: E coli, Shiga toxin Assay: NEGATIVE

## 2021-11-28 NOTE — Progress Notes (Signed)
Please let Brian [name] know his stool sample returned negative for any infectious element.

## 2021-12-07 ENCOUNTER — Ambulatory Visit: Payer: Self-pay

## 2021-12-07 NOTE — Telephone Encounter (Signed)
Pt called once again requesting return call regarding sending in a Rx because he still is not feeling well. Cb# [phone]   ----- Message from Yvette Rack sent at 12/07/2021 11:48 AM EST -----  Pt stated he feels like he has the flu and would like a Rx to be sent to [place], [place]. Pt stated he does not want to have to come in because he has an appt scheduled with Dr. Wynetta Emery in February. Cb# [phone]    Chief Complaint: Cough, congestion Symptoms: Runny nose Frequency: Started 5 days ago Pertinent Negatives: Patient denies fever. COVID test negative Disposition: [] ED /[] Urgent Care (no appt availability in office) / [x] Appointment(In office/virtual)/ []  [facility]/ [] Home Care/ [] Refused Recommended Disposition /[]  Mobile [facility]/ []  Follow-up with PCP Additional Notes: Wife requests call home phone.   Reason for Disposition  [1] Using nasal washes and pain medicine > 24 hours AND [2] sinus pain (around cheekbone or eye) persists  Answer Assessment - Initial Assessment Questions 1. ONSET: "When did the cough begin?"      5 days 2. SEVERITY: "How bad is the cough today?"      Moderate 3. SPUTUM: "Describe the color of your sputum" (none, dry cough; clear, white, yellow, green)     Yellow 4. HEMOPTYSIS: "Are you coughing up any blood?" If so ask: "How much?" (flecks, streaks, tablespoons, etc.)     No 5. DIFFICULTY BREATHING: "Are you having difficulty breathing?" If Yes, ask: "How bad is it?" (e.g., mild, moderate, severe)    - MILD: No SOB at rest, mild SOB with walking, speaks normally in sentences, can lie down, no retractions, pulse < 100.    - MODERATE: SOB at rest, SOB with minimal exertion and prefers to sit, cannot lie down flat, speaks in phrases, mild retractions, audible wheezing, pulse 100-120.    - SEVERE: Very SOB at rest, speaks in single words, struggling to breathe, sitting hunched forward,  retractions, pulse > 120      Mild 6. FEVER: "Do you have a fever?" If Yes, ask: "What is your temperature, how was it measured, and when did it start?"     No 7. CARDIAC HISTORY: "Do you have any history of heart disease?" (e.g., heart attack, congestive heart failure)      No 8. LUNG HISTORY: "Do you have any history of lung disease?"  (e.g., pulmonary embolus, asthma, emphysema)     No 9. PE RISK FACTORS: "Do you have a history of blood clots?" (or: recent major surgery, recent prolonged travel, bedridden)     No 10. OTHER SYMPTOMS: "Do you have any other symptoms?" (e.g., runny nose, wheezing, chest pain)       Runny nose 11. PREGNANCY: "Is there any chance you are pregnant?" "When was your last menstrual period?"       N/a 12. TRAVEL: "Have you traveled out of the country in the last month?" (e.g., travel history, exposures)       no  Protocols used: Cough - Acute Productive-A-AH

## 2021-12-08 ENCOUNTER — Telehealth: Payer: Medicare HMO | Admitting: Nurse Practitioner

## 2021-12-19 ENCOUNTER — Ambulatory Visit (INDEPENDENT_AMBULATORY_CARE_PROVIDER_SITE_OTHER): Payer: Medicare HMO | Admitting: Family Medicine

## 2021-12-19 ENCOUNTER — Encounter: Payer: Self-pay | Admitting: Family Medicine

## 2021-12-19 ENCOUNTER — Other Ambulatory Visit: Payer: Self-pay

## 2021-12-19 VITALS — BP 123/78 | HR 62 | Temp 97.7°F | Ht 68.0 in | Wt 124.0 lb

## 2021-12-19 DIAGNOSIS — I129 Hypertensive chronic kidney disease with stage 1 through stage 4 chronic kidney disease, or unspecified chronic kidney disease: Secondary | ICD-10-CM

## 2021-12-19 DIAGNOSIS — I5020 Unspecified systolic (congestive) heart failure: Secondary | ICD-10-CM | POA: Diagnosis not present

## 2021-12-19 DIAGNOSIS — K219 Gastro-esophageal reflux disease without esophagitis: Secondary | ICD-10-CM

## 2021-12-19 DIAGNOSIS — I2510 Atherosclerotic heart disease of native coronary artery without angina pectoris: Secondary | ICD-10-CM | POA: Diagnosis not present

## 2021-12-19 DIAGNOSIS — R21 Rash and other nonspecific skin eruption: Secondary | ICD-10-CM | POA: Diagnosis not present

## 2021-12-19 DIAGNOSIS — F419 Anxiety disorder, unspecified: Secondary | ICD-10-CM

## 2021-12-19 DIAGNOSIS — I700 Atherosclerosis of aorta: Secondary | ICD-10-CM | POA: Diagnosis not present

## 2021-12-19 DIAGNOSIS — R69 Illness, unspecified: Secondary | ICD-10-CM | POA: Diagnosis not present

## 2021-12-19 DIAGNOSIS — I714 Abdominal aortic aneurysm, without rupture, unspecified: Secondary | ICD-10-CM | POA: Diagnosis not present

## 2021-12-19 DIAGNOSIS — F32A Depression, unspecified: Secondary | ICD-10-CM

## 2021-12-19 DIAGNOSIS — Z0000 Encounter for general adult medical examination without abnormal findings: Secondary | ICD-10-CM | POA: Diagnosis not present

## 2021-12-19 DIAGNOSIS — E782 Mixed hyperlipidemia: Secondary | ICD-10-CM | POA: Diagnosis not present

## 2021-12-19 LAB — MICROALBUMIN, URINE WAIVED
Creatinine, Urine Waived: 100 mg/dL (ref 10–300)
Microalb, Ur Waived: 30 mg/L — ABNORMAL HIGH (ref 0–19)
Microalb/Creat Ratio: <30 mg/g (ref <30)

## 2021-12-19 MED ORDER — METOPROLOL SUCCINATE ER 25 MG PO TB24: 25.0000 mg | ORAL_TABLET | Freq: Every day | ORAL | 1 refills | Status: DC

## 2021-12-19 MED ORDER — ATORVASTATIN CALCIUM 80 MG PO TABS: ORAL_TABLET | ORAL | 1 refills | Status: DC

## 2021-12-19 NOTE — Assessment & Plan Note (Signed)
Under good control on current regimen. Continue current regimen. Continue to monitor. Call with any concerns. Refills given. Labs drawn today.   

## 2021-12-19 NOTE — Assessment & Plan Note (Signed)
No pain.  Continue current regimen. Continue to follow with cardiology. Call with any concerns.

## 2021-12-19 NOTE — Patient Instructions (Signed)
DO NOT RESTART THE LISINOPRIL  OK TO CONTINUE HIS METOPROLOL

## 2021-12-19 NOTE — Assessment & Plan Note (Signed)
Euvolemic today. Continue current regimen. Continue to follow with cardiology. Call with any concerns.  

## 2021-12-19 NOTE — Assessment & Plan Note (Signed)
Will keep BP and cholesterol under good control. Continue current regimen. Continue to follow with cardiology. Call with any concerns.  

## 2021-12-19 NOTE — Progress Notes (Signed)
BP 123/78    Pulse 62    Temp 97.7 F (36.5 C)    Ht 5\' 8"  (1.727 m)    Wt 124 lb (56.2 kg)    SpO2 96%    BMI 18.85 kg/m    Subjective:    Patient ID: Brian [name], male    DOB: 05/16/1933, 88 y.o.   MRN: [id number]  HPI: Brian [name] is a 88 y.o. male presenting on 12/19/2021 for comprehensive medical examination. Current medical complaints include:  HYPERTENSION / HYPERLIPIDEMIA Satisfied with current treatment? yes Duration of hypertension: chronic BP monitoring frequency: not checking BP medication side effects: no Past BP meds: metoprolol Duration of hyperlipidemia: chronic Cholesterol medication side effects: no Cholesterol supplements: none Past cholesterol medications: atorvastatin Medication compliance: excellent compliance Aspirin: no Recent stressors: no Recurrent headaches: no Visual changes: no Palpitations: no Dyspnea: no Chest pain: no Lower extremity edema: no Dizzy/lightheaded: no  Interim Problems from his last visit: no  Depression Screen done today and results listed below:  Depression screen [facility] 2/9 12/19/2021 06/27/2021 06/15/2021 12/15/2020 06/25/2020  Decreased Interest 1 0 0 0 0  Down, Depressed, Hopeless 1 0 0 0 0  PHQ - 2 Score 2 0 0 0 0  Altered sleeping 0 - 0 - -  Tired, decreased energy 2 - 0 - -  Change in appetite 2 - 0 - -  Feeling bad or failure about yourself  0 - 0 - -  Trouble concentrating 0 - 0 - -  Moving slowly or fidgety/restless 0 - 0 - -  Suicidal thoughts 0 - 0 - -  PHQ-9 Score 6 - 0 - -  Difficult doing work/chores - - Not difficult at all - -  Some recent data might be hidden    Past Medical History:  Past Medical History:  Diagnosis Date   AAA (abdominal aortic aneurysm)    2.9 CM infrarenal AAA on CT 11/2011   Acute anterior wall MI ([place]) 05/29/2014   Alcoholism ([place])    Aneurysm of abdominal aorta    SMALL   Anxiety    panic attacks, rare.   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    Gastritis 03/2012   GERD (gastroesophageal reflux disease)    GI bleed    HFrEF (heart failure with reduced ejection fraction) ([place])    HOH (hard of hearing)    Hyperlipemia    Hypertension    Hypovolemic shock ([place])    Kidney stones    Myocardial infarction ([place]) 2015   Panic attacks    Positive H. pylori titer ?, before 2010   treated with Abx.    Tubular adenoma of colon     Surgical History:  Past Surgical History:  Procedure Laterality Date   CARDIAC CATHETERIZATION  2015   Stents   CATARACT EXTRACTION W/PHACO Right 09/21/2016   Procedure: CATARACT EXTRACTION PHACO AND INTRAOCULAR LENS PLACEMENT ([place]);  Surgeon: Eulogio Bear, MD;  Location: [facility] ORS;  Service: Ophthalmology;  Laterality: Right;  [place] 1.10 AP% 13.8 CDE 9.93 Fluid pack lot # [id number]   CATARACT EXTRACTION W/PHACO Left 12/07/2016   Procedure: CATARACT EXTRACTION PHACO AND INTRAOCULAR LENS PLACEMENT ([facility]);  Surgeon: Eulogio Bear, MD;  Location: [facility] ORS;  Service: Ophthalmology;  Laterality: Left;  [place] 00:42.7 ap 13.7 cde 5.85 lot #[id number]   COLONOSCOPY WITH PROPOFOL Left 06/06/2015   Procedure: COLONOSCOPY WITH PROPOFOL;  Surgeon: Hulen Luster, MD;  [facility];  Service: Endoscopy;  Laterality: Left;   CORONARY ANGIOPLASTY     2015   LEFT HEART CATHETERIZATION WITH CORONARY ANGIOGRAM Bilateral 05/29/2014   Procedure: LEFT HEART CATHETERIZATION WITH CORONARY ANGIOGRAM;  Surgeon: Laverda Page, MD;  Location: [facility] CATH LAB;  Service: Cardiovascular;  Laterality: Bilateral;   PERCUTANEOUS CORONARY STENT INTERVENTION (PCI-S)  05/29/2014   Procedure: PERCUTANEOUS CORONARY STENT INTERVENTION (PCI-S);  Surgeon: Laverda Page, MD;  Location: [facility] CATH LAB;  Service: Cardiovascular;;  DES x2 Prox and Mid LAD     Medications:  Current Outpatient  Medications on File Prior to Visit  Medication Sig   diazepam (VALIUM) 5 MG tablet Take 5 mg by mouth every 8 (eight) hours as needed (for anxiety).    nitroGLYCERIN (NITROSTAT) 0.4 MG SL tablet PLACE ONE TABLET UNDER THE TONGUE EVERY 5 MINUTES AS NEEDED FOR CHEST PAIN   traMADol (ULTRAM) 50 MG tablet Take 1 tablet (50 mg total) by mouth every 4 (four) hours as needed.   clopidogrel (PLAVIX) 75 MG tablet Take 1 tablet (75 mg total) by mouth daily. (Patient not taking: Reported on 11/21/2021)   No current facility-administered medications on file prior to visit.    Allergies:  Allergies  Allergen Reactions   Zoloft [Sertraline Hcl] Nausea Only    Patient states that he was not himself when he took this drug    Social History:  Social History   Socioeconomic History   Marital status: Married    Spouse name: Not on file   Number of children: 4   Years of education: Not on file   Highest education level: High school graduate  Occupational History   Occupation: semi retired  Tobacco Use   Smoking status: Former    Packs/day: 0.25    Years: 55.00    Pack years: 13.75    Types: Cigarettes, Cigars    Quit date: 06/2021    Years since quitting: 0.5   Smokeless tobacco: Never   Tobacco comments:    3-4 cigarettes daily   Vaping Use   Vaping Use: Never used  Substance and Sexual Activity   Alcohol use: No    Alcohol/week: 0.0 standard drinks   Drug use: No   Sexual activity: Not Currently  Other Topics Concern   Not on file  Social History Narrative   Working 3.5 hours a day 6 days a week in [employer]    Social Determinants of Health   Financial Resource Strain: Low Risk    Difficulty of Paying Living Expenses: Not hard at all  Food Insecurity: No Food Insecurity   Worried About Charity fundraiser in the Last Year: Never true   Arboriculturist in the Last Year: Never true  Transportation Needs: No Transportation Needs   Lack of Transportation (Medical): No   Lack of  Transportation (Non-Medical): No  Physical Activity: Insufficiently Active   Days of Exercise per Week: 4 days   Minutes of Exercise per Session: 20 min  Stress: No Stress Concern Present   Feeling of Stress : Not at all  Social Connections: Not on file  Intimate Partner Violence: Not on file   Social History   Tobacco Use  Smoking Status Former   Packs/day: 0.25   Years: 55.00   Pack years:  13.75   Types: Cigarettes, Cigars   Quit date: 06/2021   Years since quitting: 0.5  Smokeless Tobacco Never  Tobacco Comments   3-4 cigarettes daily    Social History   Substance and Sexual Activity  Alcohol Use No   Alcohol/week: 0.0 standard drinks    Family History:  Family History  Problem Relation Age of Onset   Heart disease Mother    Ulcers Father        stomach    Heart disease Father    Anuerysm Brother    Leukemia Brother    Heart disease Paternal Grandmother    Heart attack Paternal Grandmother    Heart disease Paternal Grandfather    Hypertension Daughter    Colon cancer Neg Hx     Past medical history, surgical history, medications, allergies, family history and social history reviewed with patient today and changes made to appropriate areas of the chart.   Review of Systems  Constitutional:  Positive for chills. Negative for diaphoresis, fever, malaise/fatigue and weight loss.  HENT: Negative.    Eyes: Negative.   Respiratory:  Positive for cough. Negative for hemoptysis, sputum production, shortness of breath and wheezing.   Cardiovascular: Negative.   Gastrointestinal:  Positive for diarrhea. Negative for abdominal pain, blood in stool, constipation, heartburn, melena, nausea and vomiting.  Genitourinary: Negative.   Musculoskeletal: Negative.   Skin:  Positive for rash. Negative for itching.  Neurological:  Positive for dizziness. Negative for tingling, tremors, sensory change, speech change, focal weakness, seizures, loss of consciousness, weakness and  headaches.  Endo/Heme/Allergies: Negative.   Psychiatric/Behavioral: Negative.    All other ROS negative except what is listed above and in the HPI.      Objective:    BP 123/78    Pulse 62    Temp 97.7 F (36.5 C)    Ht 5\' 8"  (1.727 m)    Wt 124 lb (56.2 kg)    SpO2 96%    BMI 18.85 kg/m   Wt Readings from Last 3 Encounters:  12/19/21 124 lb (56.2 kg)  11/21/21 124 lb (56.2 kg)  08/22/21 132 lb (59.9 kg)    Physical Exam Vitals and nursing note reviewed.  Constitutional:      General: He is not in acute distress.    Appearance: Normal appearance. He is normal weight. He is not ill-appearing, toxic-appearing or diaphoretic.  HENT:     Head: Normocephalic and atraumatic.     Right Ear: Tympanic membrane, ear canal and external ear normal. There is no impacted cerumen.     Left Ear: Tympanic membrane, ear canal and external ear normal. There is no impacted cerumen.     Nose: Nose normal. No congestion or rhinorrhea.     Mouth/Throat:     Mouth: Mucous membranes are moist.     Pharynx: Oropharynx is clear. No oropharyngeal exudate or posterior oropharyngeal erythema.  Eyes:     General: No scleral icterus.       Right eye: No discharge.        Left eye: No discharge.     Extraocular Movements: Extraocular movements intact.     Conjunctiva/sclera: Conjunctivae normal.     Pupils: Pupils are equal, round, and reactive to light.  Neck:     Vascular: No carotid bruit.  Cardiovascular:     Rate and Rhythm: Normal rate and regular rhythm.     Pulses: Normal pulses.     Heart sounds: No murmur heard.  No friction rub. No gallop.  Pulmonary:     Effort: Pulmonary effort is normal. No respiratory distress.     Breath sounds: Normal breath sounds. No stridor. No wheezing, rhonchi or rales.  Chest:     Chest wall: No tenderness.  Abdominal:     General: Abdomen is flat. Bowel sounds are normal. There is no distension.     Palpations: Abdomen is soft. There is no mass.      Tenderness: There is no abdominal tenderness. There is no right CVA tenderness, left CVA tenderness, guarding or rebound.     Hernia: No hernia is present.  Genitourinary:    Comments: Genital exam deferred with shared decision making Musculoskeletal:        General: No swelling, tenderness, deformity or signs of injury.     Cervical back: Normal range of motion and neck supple. No rigidity. No muscular tenderness.     Right lower leg: No edema.     Left lower leg: No edema.  Lymphadenopathy:     Cervical: No cervical adenopathy.  Skin:    General: Skin is warm and dry.     Capillary Refill: Capillary refill takes less than 2 seconds.     Coloration: Skin is not jaundiced or pale.     Findings: No bruising, erythema, lesion or rash.  Neurological:     General: No focal deficit present.     Mental Status: He is alert and oriented to person, place, and time.     Cranial Nerves: No cranial nerve deficit.     Sensory: No sensory deficit.     Motor: No weakness.     Coordination: Coordination normal.     Gait: Gait normal.     Deep Tendon Reflexes: Reflexes normal.  Psychiatric:        Mood and Affect: Mood normal.        Behavior: Behavior normal.        Thought Content: Thought content normal.        Judgment: Judgment normal.    Results for orders placed or performed in visit on 12/19/21  Microalbumin, Urine Waived  Result Value Ref Range   Microalb, Ur Waived 30 (H) 0 - 19 mg/L   Creatinine, Urine Waived 100 10 - 300 mg/dL   Microalb/Creat Ratio <30 <30 mg/g      Assessment & Plan:   Problem List Items Addressed This Visit       Cardiovascular and Mediastinum   AAA (abdominal aortic aneurysm) without rupture    Stable. Continue to follow with vascular and cardiology. Call with any concerns. Continue to monitor.      Relevant Medications   atorvastatin (LIPITOR) 80 MG tablet   metoprolol succinate (TOPROL-XL) 25 MG 24 hr tablet   CAD (coronary artery disease)    No  pain.  Continue current regimen. Continue to follow with cardiology. Call with any concerns.       Relevant Medications   atorvastatin (LIPITOR) 80 MG tablet   metoprolol succinate (TOPROL-XL) 25 MG 24 hr tablet   HFrEF (heart failure with reduced ejection fraction) ([place])    Euvolemic today. Continue current regimen. Continue to follow with cardiology. Call with any concerns.       Relevant Medications   atorvastatin (LIPITOR) 80 MG tablet   metoprolol succinate (TOPROL-XL) 25 MG 24 hr tablet   Aortic atherosclerosis (HCC)    Will keep BP and cholesterol under good control.  Continue current regimen. Continue to follow  with cardiology. Call with any concerns.       Relevant Medications   atorvastatin (LIPITOR) 80 MG tablet   metoprolol succinate (TOPROL-XL) 25 MG 24 hr tablet     Digestive   GERD (gastroesophageal reflux disease)    Under good control on current regimen. Continue current regimen. Continue to monitor. Call with any concerns. Refills given. Labs drawn today.        Relevant Orders   Comprehensive metabolic panel   CBC with Differential/Platelet     Genitourinary   Benign hypertensive renal disease    Under good control on current regimen. Continue current regimen. Continue to monitor. Call with any concerns. Refills given. Labs drawn today.        Relevant Orders   Comprehensive metabolic panel   Microalbumin, Urine Waived (Completed)     Other   Anxiety and depression    Under good control on current regimen. Continue current regimen. Continue to monitor. Call with any concerns. Refills given. Labs drawn today.       Mixed hyperlipidemia    Under good control on current regimen. Continue current regimen. Continue to monitor. Call with any concerns. Refills given. Labs drawn today.       Relevant Medications   atorvastatin (LIPITOR) 80 MG tablet   metoprolol succinate (TOPROL-XL) 25 MG 24 hr tablet   Other Relevant Orders   Comprehensive  metabolic panel   Lipid Panel w/o Chol/HDL Ratio   Other Visit Diagnoses     Routine general medical examination at a health care facility    -  Primary   Vaccines up to date. Screening labs checked today. Continue diet and exercise. Call with any concerns. Continue to monitor.    Rash       Referral to dermatology made today. Call with any concerns.    Relevant Orders   Ambulatory referral to Dermatology        LABORATORY TESTING:  Health maintenance labs ordered today as discussed above.   IMMUNIZATIONS:   - Tdap: Tetanus vaccination status reviewed: last tetanus booster within 10 years. - Influenza: Up to date - Pneumovax: Up to date - Prevnar: Up to date - COVID: Up to date - HPV: Not applicable - Shingrix vaccine: Not applicable  SCREENING: - Colonoscopy: Not applicable  Discussed with patient purpose of the colonoscopy is to detect colon cancer at curable precancerous or early stages   Follow up plan: NEXT PREVENTATIVE PHYSICAL DUE IN 1 YEAR. Return in about 6 months (around 06/18/2022).

## 2021-12-19 NOTE — Assessment & Plan Note (Signed)
Stable. Continue to follow with vascular and cardiology. Call with any concerns. Continue to monitor.

## 2021-12-20 ENCOUNTER — Encounter: Payer: Self-pay | Admitting: Family Medicine

## 2021-12-20 LAB — COMPREHENSIVE METABOLIC PANEL
ALT: 11 IU/L (ref 0–44)
AST: 16 IU/L (ref 0–40)
Albumin/Globulin Ratio: 1.5 (ref 1.2–2.2)
Albumin: 4.2 g/dL (ref 3.6–4.6)
Alkaline Phosphatase: 78 IU/L (ref 44–121)
BUN/Creatinine Ratio: 10 (ref 10–24)
BUN: 8 mg/dL (ref 8–27)
Bilirubin Total: 0.5 mg/dL (ref 0.0–1.2)
CO2: 26 mmol/L (ref 20–29)
Calcium: 9.3 mg/dL (ref 8.6–10.2)
Chloride: 107 mmol/L — ABNORMAL HIGH (ref 96–106)
Creatinine, Ser: 0.79 mg/dL (ref 0.76–1.27)
Globulin, Total: 2.8 g/dL (ref 1.5–4.5)
Glucose: 115 mg/dL — ABNORMAL HIGH (ref 70–99)
Potassium: 4.4 mmol/L (ref 3.5–5.2)
Sodium: 148 mmol/L — ABNORMAL HIGH (ref 134–144)
Total Protein: 7 g/dL (ref 6.0–8.5)
eGFR: 87 mL/min/{1.73_m2} (ref >59)

## 2021-12-20 LAB — CBC WITH DIFFERENTIAL/PLATELET
Basophils Absolute: 0 10*3/uL (ref 0.0–0.2)
Basos: 1 %
EOS (ABSOLUTE): 0.2 10*3/uL (ref 0.0–0.4)
Eos: 3 %
Hematocrit: 43.6 % (ref 37.5–51.0)
Hemoglobin: 14.7 g/dL (ref 13.0–17.7)
Immature Grans (Abs): 0 10*3/uL (ref 0.0–0.1)
Immature Granulocytes: 0 %
Lymphocytes Absolute: 2 10*3/uL (ref 0.7–3.1)
Lymphs: 34 %
MCH: 32.8 pg (ref 26.6–33.0)
MCHC: 33.7 g/dL (ref 31.5–35.7)
MCV: 97 fL (ref 79–97)
Monocytes Absolute: 0.5 10*3/uL (ref 0.1–0.9)
Monocytes: 8 %
Neutrophils Absolute: 3.2 10*3/uL (ref 1.4–7.0)
Neutrophils: 54 %
Platelets: 165 10*3/uL (ref 150–450)
RBC: 4.48 x10E6/uL (ref 4.14–5.80)
RDW: 13.1 % (ref 11.6–15.4)
WBC: 6 10*3/uL (ref 3.4–10.8)

## 2021-12-20 LAB — LIPID PANEL W/O CHOL/HDL RATIO
Cholesterol, Total: 151 mg/dL (ref 100–199)
HDL: 76 mg/dL (ref >39)
LDL Chol Calc (NIH): 61 mg/dL (ref 0–99)
Triglycerides: 71 mg/dL (ref 0–149)
VLDL Cholesterol Cal: 14 mg/dL (ref 5–40)

## 2021-12-22 DIAGNOSIS — Z20822 Contact with and (suspected) exposure to covid-19: Secondary | ICD-10-CM | POA: Diagnosis not present

## 2021-12-22 DIAGNOSIS — Z03818 Encounter for observation for suspected exposure to other biological agents ruled out: Secondary | ICD-10-CM | POA: Diagnosis not present

## 2021-12-26 DIAGNOSIS — M545 Low back pain, unspecified: Secondary | ICD-10-CM | POA: Diagnosis not present

## 2021-12-26 DIAGNOSIS — R69 Illness, unspecified: Secondary | ICD-10-CM | POA: Diagnosis not present

## 2021-12-26 DIAGNOSIS — M25511 Pain in right shoulder: Secondary | ICD-10-CM | POA: Diagnosis not present

## 2021-12-28 ENCOUNTER — Ambulatory Visit: Payer: Medicare HMO | Admitting: Dermatology

## 2021-12-28 ENCOUNTER — Other Ambulatory Visit: Payer: Self-pay

## 2021-12-28 ENCOUNTER — Ambulatory Visit: Payer: Medicare Other | Admitting: Dermatology

## 2021-12-28 DIAGNOSIS — D0471 Carcinoma in situ of skin of right lower limb, including hip: Secondary | ICD-10-CM

## 2021-12-28 DIAGNOSIS — L814 Other melanin hyperpigmentation: Secondary | ICD-10-CM | POA: Diagnosis not present

## 2021-12-28 DIAGNOSIS — C4492 Squamous cell carcinoma of skin, unspecified: Secondary | ICD-10-CM

## 2021-12-28 DIAGNOSIS — Z86007 Personal history of in-situ neoplasm of skin: Secondary | ICD-10-CM

## 2021-12-28 DIAGNOSIS — D489 Neoplasm of uncertain behavior, unspecified: Secondary | ICD-10-CM

## 2021-12-28 HISTORY — DX: Squamous cell carcinoma of skin, unspecified: C44.92

## 2021-12-28 HISTORY — DX: Personal history of in-situ neoplasm of skin: Z86.007

## 2021-12-28 NOTE — Patient Instructions (Addendum)
°Biopsy Wound Care Instructions ° °Leave the original bandage on for 24 hours if possible.  If the bandage becomes soaked or soiled before that time, it is OK to remove it and examine the wound.  A small amount of post-operative bleeding is normal.  If excessive bleeding occurs, remove the bandage, place gauze over the site and apply continuous pressure (no peeking) over the area for 30 minutes. If this does not work, please call our clinic as soon as possible or page your doctor if it is after hours.  ° °Once a day, cleanse the wound with soap and water. It is fine to shower. If a thick crust develops you may use a Q-tip dipped into dilute hydrogen peroxide (mix 1:1 with water) to dissolve it.  Hydrogen peroxide can slow the healing process, so use it only as needed.   ° °After washing, apply petroleum jelly (Vaseline) or an antibiotic ointment if your doctor prescribed one for you, followed by a bandage.   ° °For best healing, the wound should be covered with a layer of ointment at all times. If you are not able to keep the area covered with a bandage to hold the ointment in place, this may mean re-applying the ointment several times a day.  Continue this wound care until the wound has healed and is no longer open.  ° °Itching and mild discomfort is normal during the healing process. However, if you develop pain or severe itching, please call our office.  ° °If you have any discomfort, you can take Tylenol (acetaminophen) or ibuprofen as directed on the bottle. (Please do not take these if you have an allergy to them or cannot take them for another reason). ° °Some redness, tenderness and white or yellow material in the wound is normal healing.  If the area becomes very sore and red, or develops a thick yellow-green material (pus), it may be infected; please notify us.   ° °If you have stitches, return to clinic as directed to have the stitches removed. You will continue wound care for 2-3 days after the stitches  are removed.  ° °Wound healing continues for up to one year following surgery. It is not unusual to experience pain in the scar from time to time during the interval.  If the pain becomes severe or the scar thickens, you should notify the office.   ° °A slight amount of redness in a scar is expected for the first six months.  After six months, the redness will fade and the scar will soften and fade.  The color difference becomes less noticeable with time.  If there are any problems, return for a post-op surgery check at your earliest convenience. ° °To improve the appearance of the scar, you can use silicone scar gel, cream, or sheets (such as Mederma or Serica) every night for up to one year. These are available over the counter (without a prescription). ° °Please call our office at [phone] for any questions or concerns. ° ° ° ° ° ° ° Melanoma ABCDEs ° °Melanoma is the most dangerous type of skin cancer, and is the leading cause of death from skin disease.  You are more likely to develop melanoma if you: °Have light-colored skin, light-colored eyes, or red or blond hair °Spend a lot of time in the sun °Tan regularly, either outdoors or in a tanning bed °Have had blistering sunburns, especially during childhood °Have a close family member who has had a melanoma °Have atypical   moles or large birthmarks ° °Early detection of melanoma is key since treatment is typically straightforward and cure rates are extremely high if we catch it early.  ° °The first sign of melanoma is often a change in a mole or a new dark spot.  The ABCDE system is a way of remembering the signs of melanoma. ° °A for asymmetry:  The two halves do not match. °B for border:  The edges of the growth are irregular. °C for color:  A mixture of colors are present instead of an even brown color. °D for diameter:  Melanomas are usually (but not always) greater than 6mm - the size of a pencil eraser. °E for evolution:  The spot keeps changing in  size, shape, and color. ° °Please check your skin once per month between visits. You can use a small mirror in front and a large mirror behind you to keep an eye on the back side or your body.  ° °If you see any new or changing lesions before your next follow-up, please call to schedule a visit. ° °Please continue daily skin protection including broad spectrum sunscreen SPF 30+ to sun-exposed areas, reapplying every 2 hours as needed when you're outdoors.  ° °Staying in the shade or wearing long sleeves, sun glasses (UVA+UVB protection) and wide brim hats (4-inch brim around the entire circumference of the hat) are also recommended for sun protection.   ° °If You Need Anything After Your Visit ° °If you have any questions or concerns for your doctor, please call our main line at [phone] and press option 4 to reach your doctor's medical assistant. If no one answers, please leave a voicemail as directed and we will return your call as soon as possible. Messages left after 4 pm will be answered the following business day.  ° °You may also send us a message via MyChart. We typically respond to MyChart messages within 1-2 business days. ° °For prescription refills, please ask your pharmacy to contact our office. Our fax number is [phone]. ° °If you have an urgent issue when the clinic is closed that cannot wait until the next business day, you can page your doctor at the number below.   ° °Please note that while we do our best to be available for urgent issues outside of office hours, we are not available 24/7.  ° °If you have an urgent issue and are unable to reach us, you may choose to seek medical care at your doctor's office, retail clinic, urgent care center, or emergency room. ° °If you have a medical emergency, please immediately call 911 or go to the emergency department. ° °Pager Numbers ° °- Dr. Kowalski: [phone] ° °- Dr. Moye: [phone] ° °- Dr. Stewart: [phone] ° °In the event of  inclement weather, please call our main line at [phone] for an update on the status of any delays or closures. ° °Dermatology Medication Tips: °Please keep the boxes that topical medications come in in order to help keep track of the instructions about where and how to use these. Pharmacies typically print the medication instructions only on the boxes and not directly on the medication tubes.  ° °If your medication is too expensive, please contact our office at [phone] option 4 or send us a message through MyChart.  ° °We are unable to tell what your co-pay for medications will be in advance as this is different depending on your insurance coverage. However, we may be able to   find a substitute medication at lower cost or fill out paperwork to get insurance to cover a needed medication.  ° °If a prior authorization is required to get your medication covered by your insurance company, please allow us 1-2 business days to complete this process. ° °Drug prices often vary depending on where the prescription is filled and some pharmacies may offer cheaper prices. ° °The website www.goodrx.com contains coupons for medications through different pharmacies. The prices here do not account for what the cost may be with help from insurance (it may be cheaper with your insurance), but the website can give you the price if you did not use any insurance.  °- You can print the associated coupon and take it with your prescription to the pharmacy.  °- You may also stop by our office during regular business hours and pick up a GoodRx coupon card.  °- If you need your prescription sent electronically to a different pharmacy, notify our office through  MyChart or by phone at [phone] option 4. ° ° ° ° °Si Usted Necesita Algo Después de Su Visita ° °También puede enviarnos un mensaje a través de MyChart. Por lo general respondemos a los mensajes de MyChart en el transcurso de 1 a 2 días hábiles. ° °Para renovar  recetas, por favor pida a su farmacia que se ponga en contacto con nuestra oficina. Nuestro número de fax es el [phone]. ° °Si tiene un asunto urgente cuando la clínica esté cerrada y que no puede esperar hasta el siguiente día hábil, puede llamar/localizar a su doctor(a) al número que aparece a continuación.  ° °Por favor, tenga en cuenta que aunque hacemos todo lo posible para estar disponibles para asuntos urgentes fuera del horario de oficina, no estamos disponibles las 24 horas del día, los 7 días de la semana.  ° °Si tiene un problema urgente y no puede comunicarse con nosotros, puede optar por buscar atención médica  en el consultorio de su doctor(a), en una clínica privada, en un centro de atención urgente o en una sala de emergencias. ° °Si tiene una emergencia médica, por favor llame inmediatamente al 911 o vaya a la sala de emergencias. ° °Números de bíper ° °- Dr. Kowalski: [phone] ° °- Dra. Moye: [phone] ° °- Dra. Stewart: [phone] ° °En caso de inclemencias del tiempo, por favor llame a nuestra línea principal al [phone] para una actualización sobre el estado de cualquier retraso o cierre. ° °Consejos para la medicación en dermatología: °Por favor, guarde las cajas en las que vienen los medicamentos de uso tópico para ayudarle a seguir las instrucciones sobre dónde y cómo usarlos. Las farmacias generalmente imprimen las instrucciones del medicamento sólo en las cajas y no directamente en los tubos del medicamento.  ° °Si su medicamento es muy caro, por favor, póngase en contacto con nuestra oficina llamando al [phone] y presione la opción 4 o envíenos un mensaje a través de MyChart.  ° °No podemos decirle cuál será su copago por los medicamentos por adelantado ya que esto es diferente dependiendo de la cobertura de su seguro. Sin embargo, es posible que podamos encontrar un medicamento sustituto a menor costo o llenar un formulario para que el seguro cubra el medicamento  que se considera necesario.  ° °Si se requiere una autorización previa para que su compañía de seguros cubra su medicamento, por favor permítanos de 1 a 2 días hábiles para completar este proceso. ° °Los precios de los medicamentos varían con frecuencia dependiendo   del lugar de dónde se surte la receta y alguna farmacias pueden ofrecer precios más baratos. ° °El sitio web www.goodrx.com tiene cupones para medicamentos de diferentes farmacias. Los precios aquí no tienen en cuenta lo que podría costar con la ayuda del seguro (puede ser más barato con su seguro), pero el sitio web puede darle el precio si no utilizó ningún seguro.  °- Puede imprimir el cupón correspondiente y llevarlo con su receta a la farmacia.  °- También puede pasar por nuestra oficina durante el horario de atención regular y recoger una tarjeta de cupones de GoodRx.  °- Si necesita que su receta se envíe electrónicamente a una farmacia diferente, informe a nuestra oficina a través de MyChart de [place] o por teléfono llamando al [phone] y presione la opción 4. ° °

## 2021-12-28 NOTE — Progress Notes (Signed)
° °  New Patient Visit  Subjective  Brian [name] is a 88 y.o. male who presents for the following: New Patient (Initial Visit) (Patient here today for a scaly and red spot at lower right leg. Patient reports rarely itches.  Patient denies personal or family history of cancer. ).   The following portions of the chart were reviewed this encounter and updated as appropriate:   Tobacco   Allergies   Meds   Problems   Med Hx   Surg Hx   Fam Hx       Review of Systems:  No other skin or systemic complaints except as noted in HPI or Assessment and Plan.  Objective  Well appearing patient in no apparent distress; mood and affect are within normal limits.  A focused examination was performed including right medial calf, right preauricular area, face, arms, hands. Relevant physical exam findings are noted in the Assessment and Plan.  right medial calf superior 2.0 cm scaly pink plaque        right medial calf inferior 3 cm scaly pink plaque        Right Preauricular Area 1.4 cm medium brown patch without suspicious foci          Assessment & Plan  Neoplasm of uncertain behavior (2) right medial calf superior  Skin / nail biopsy Type of biopsy: tangential   Informed consent: discussed and consent obtained   Timeout: patient name, date of birth, surgical site, and procedure verified   Patient was prepped and draped in usual sterile fashion: Area prepped with isopropyl alcohol. Anesthesia: the lesion was anesthetized in a standard fashion   Anesthetic:  1% lidocaine w/ epinephrine 1-100,000 buffered w/ 8.4% NaHCO3 Instrument used: flexible razor blade   Hemostasis achieved with: aluminum chloride   Outcome: patient tolerated procedure well   Post-procedure details: wound care instructions given   Additional details:  Mupirocin and a bandage applied  Specimen 1 - Surgical pathology Differential Diagnosis: r/o sccis  Check Margins: No  right medial calf  inferior  Skin / nail biopsy Type of biopsy: tangential   Informed consent: discussed and consent obtained   Timeout: patient name, date of birth, surgical site, and procedure verified   Patient was prepped and draped in usual sterile fashion: Area prepped with isopropyl alcohol. Anesthesia: the lesion was anesthetized in a standard fashion   Anesthetic:  1% lidocaine w/ epinephrine 1-100,000 buffered w/ 8.4% NaHCO3 Instrument used: flexible razor blade   Hemostasis achieved with: aluminum chloride   Outcome: patient tolerated procedure well   Post-procedure details: wound care instructions given   Additional details:  Mupirocin and a bandage applied  Specimen 2 - Surgical pathology Differential Diagnosis: r/o sccis  Check Margins: No  R/o sccis   Lentigines Right Preauricular Area  Photo today will recheck at next follow up  Lentigo vs macular seborrheic keratosis favored.  Call for changes    Return for next available for tbse .  I, Ruthell Rummage, CMA, am acting as scribe for Forest Gleason, MD.  Documentation: I have reviewed the above documentation for accuracy and completeness, and I agree with the above.  Forest Gleason, MD

## 2022-01-05 ENCOUNTER — Telehealth: Payer: Self-pay

## 2022-01-05 NOTE — Telephone Encounter (Addendum)
°  Patient called and scheduled for appointment for treatment of ED&C x 2. Patient scheduled for March 14 at 8:10 am. Patient verbalized understanding and denied further questions.     ----- Message from Alfonso Patten, MD sent at 01/04/2022  5:24 PM EST ----- 1. 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 Thank you!

## 2022-01-23 ENCOUNTER — Encounter: Payer: Self-pay | Admitting: Dermatology

## 2022-01-23 ENCOUNTER — Other Ambulatory Visit: Payer: Self-pay

## 2022-01-23 ENCOUNTER — Ambulatory Visit: Payer: Medicare HMO | Admitting: Dermatology

## 2022-01-23 DIAGNOSIS — D0471 Carcinoma in situ of skin of right lower limb, including hip: Secondary | ICD-10-CM | POA: Diagnosis not present

## 2022-01-23 DIAGNOSIS — D099 Carcinoma in situ, unspecified: Secondary | ICD-10-CM

## 2022-01-23 DIAGNOSIS — L578 Other skin changes due to chronic exposure to nonionizing radiation: Secondary | ICD-10-CM | POA: Diagnosis not present

## 2022-01-23 MED ORDER — MUPIROCIN 2 % EX OINT: 1.0000 "application " | TOPICAL_OINTMENT | Freq: Every day | CUTANEOUS | 0 refills | Status: DC

## 2022-01-23 NOTE — Patient Instructions (Addendum)
Electrodesiccation and Curettage (Scrape and Burn) Wound Care Instructions  Leave the original bandage on for 24 hours if possible.  If the bandage becomes soaked or soiled before that time, it is OK to remove it and examine the wound.  A small amount of post-operative bleeding is normal.  If excessive bleeding occurs, remove the bandage, place gauze over the site and apply continuous pressure (no peeking) over the area for 30 minutes. If this does not work, please call our clinic as soon as possible or page your doctor if it is after hours.   Once a day, cleanse the wound with soap and water. It is fine to shower. If a thick crust develops you may use a Q-tip dipped into dilute hydrogen peroxide (mix 1:1 with water) to dissolve it.  Hydrogen peroxide can slow the healing process, so use it only as needed.    After washing, apply Mupericin ointment, an antibiotic ointment if your doctor prescribed one for you, followed by a bandage.    For best healing, the wound should be covered with a layer of ointment at all times. If you are not able to keep the area covered with a bandage to hold the ointment in place, this may mean re-applying the ointment several times a day.  Continue this wound care until the wound has healed and is no longer open. It may take several weeks for the wound to heal and close.  Itching and mild discomfort is normal during the healing process.  If you have any discomfort, you can take Tylenol (acetaminophen) or ibuprofen as directed on the bottle. (Please do not take these if you have an allergy to them or cannot take them for another reason).  Some redness, tenderness and white or yellow material in the wound is normal healing.  If the area becomes very sore and red, or develops a thick yellow-green material (pus), it may be infected; please notify us.    Wound healing continues for up to one year following surgery. It is not unusual to experience pain in the scar from time  to time during the interval.  If the pain becomes severe or the scar thickens, you should notify the office.    A slight amount of redness in a scar is expected for the first six months.  After six months, the redness will fade and the scar will soften and fade.  The color difference becomes less noticeable with time.  If there are any problems, return for a post-op surgery check at your earliest convenience.  To improve the appearance of the scar, you can use silicone scar gel, cream, or sheets (such as Mederma or Serica) every night for up to one year. These are available over the counter (without a prescription).  Please call our office at [phone] for any questions or concerns.  If You Need Anything After Your Visit  If you have any questions or concerns for your doctor, please call our main line at [phone] and press option 4 to reach your doctor's medical assistant. If no one answers, please leave a voicemail as directed and we will return your call as soon as possible. Messages left after 4 pm will be answered the following business day.   You may also send [place] a message via [place]. We typically respond to MyChart messages within 1-2 business days.  For prescription refills, please ask your pharmacy to contact our office. Our fax number is [phone].  If you have an urgent issue  when the clinic is closed that cannot wait until the next business day, you can page your doctor at the number below.    Please note that while we do our best to be available for urgent issues outside of office hours, we are not available 24/7.   If you have an urgent issue and are unable to reach [place], you may choose to seek medical care at your doctor's office, retail clinic, urgent care center, or emergency room.  If you have a medical emergency, please immediately call 911 or go to the emergency department.  Pager Numbers  - Dr. Nehemiah Massed: [phone]  - Dr. Laurence Ferrari: [phone]  - Dr.  Nicole Kindred: [phone]  In the event of inclement weather, please call our main line at [phone] for an update on the status of any delays or closures.  Dermatology Medication Tips: Please keep the boxes that topical medications come in in order to help keep track of the instructions about where and how to use these. Pharmacies typically print the medication instructions only on the boxes and not directly on the medication tubes.   If your medication is too expensive, please contact our office at [phone] option 4 or send [place] a message through [place].   We are unable to tell what your co-pay for medications will be in advance as this is different depending on your insurance coverage. However, we may be able to find a substitute medication at lower cost or fill out paperwork to get insurance to cover a needed medication.   If a prior authorization is required to get your medication covered by your insurance company, please allow [place] 1-2 business days to complete this process.  Drug prices often vary depending on where the prescription is filled and some pharmacies may offer cheaper prices.  The website www.goodrx.com contains coupons for medications through different pharmacies. The prices here do not account for what the cost may be with help from insurance (it may be cheaper with your insurance), but the website can give you the price if you did not use any insurance.  - You can print the associated coupon and take it with your prescription to the pharmacy.  - You may also stop by our office during regular business hours and pick up a GoodRx coupon card.  - If you need your prescription sent electronically to a different pharmacy, notify our office through [facility] or by phone at [phone] option 4.     Si Usted Necesita Algo Despus de Su Visita  Tambin puede enviarnos un mensaje a travs de Pharmacist, community. Por lo general respondemos a los mensajes de MyChart en el transcurso  de 1 a 2 das hbiles.  Para renovar recetas, por favor pida a su farmacia que se ponga en contacto con nuestra oficina. Harland Dingwall de fax es [place] [phone].  Si tiene un asunto urgente cuando la clnica est cerrada y que no puede esperar hasta el siguiente da hbil, puede llamar/localizar a su doctor(a) al nmero que aparece a continuacin.   Por favor, tenga en cuenta que aunque hacemos todo lo posible para estar disponibles para asuntos urgentes fuera del horario de [place], no estamos disponibles las 24 horas del da, los 7 das de la [place].   Si tiene un problema urgente y no puede comunicarse con nosotros, puede optar por buscar atencin mdica  en el consultorio de su doctor(a), en una clnica privada, en un centro de atencin urgente o en una sala de emergencias.  Si  tiene una emergencia mdica, por favor llame inmediatamente al 911 o vaya a la sala de emergencias.  Nmeros de bper  - Dr. Nehemiah Massed: [phone]  - Dra. Moye: [phone]  - Dra. Nicole Kindred: [phone]  En caso de inclemencias del [place], por favor llame a Johnsie Kindred principal al [phone] para una actualizacin sobre el [place] de cualquier retraso o cierre.  Consejos para la medicacin en dermatologa: Por favor, guarde las cajas en las que vienen los medicamentos de uso tpico para ayudarle a seguir las instrucciones sobre dnde y cmo usarlos. Las farmacias generalmente imprimen las instrucciones del medicamento slo en las cajas y no directamente en los tubos del [place].   Si su medicamento es muy caro, por favor, pngase en contacto con Zigmund Daniel llamando al [phone] y presione la opcin 4 o envenos un mensaje a travs de Pharmacist, community.   No podemos decirle cul ser su copago por los medicamentos por adelantado ya que esto es diferente dependiendo de la cobertura de su seguro. Sin embargo, es posible que podamos encontrar un medicamento sustituto a Electrical engineer un formulario  para que el seguro cubra el medicamento que se considera necesario.   Si se requiere una autorizacin previa para que su compaa de seguros [place] su medicamento, por favor permtanos de 1 a 2 das hbiles para completar este proceso.  Los precios de los medicamentos varan con frecuencia dependiendo del Environmental consultant de dnde se surte la receta y alguna farmacias pueden ofrecer precios ms baratos.  El sitio web www.goodrx.com tiene cupones para medicamentos de Airline pilot. Los precios aqu no tienen en cuenta lo que podra costar con la ayuda del seguro (puede ser ms barato con su seguro), pero el sitio web puede darle el precio si no utiliz Research scientist (physical sciences).  - Puede imprimir el cupn correspondiente y llevarlo con su receta a la farmacia.  - Tambin puede pasar por nuestra oficina durante el horario de atencin regular y Charity fundraiser una tarjeta de cupones de GoodRx.  - Si necesita que su receta se enve electrnicamente a una farmacia diferente, informe a nuestra oficina a travs de MyChart de  o por telfono llamando al [phone] y presione la opcin 4.

## 2022-01-23 NOTE — Progress Notes (Signed)
? ?  Follow-Up Visit ?  ?Subjective  ?Brian [name] is a 88 y.o. male who presents for the following: SCC IS x 2 (R medial calf superior, R medial calf inferior, bx proven).  Here for treatment with EDC. ? ? ?The following portions of the chart were reviewed this encounter and updated as appropriate:  ?  ?  ? ?Review of Systems:  No other skin or systemic complaints except as noted in HPI or Assessment and Plan. ? ?Objective  ?Well appearing patient in no apparent distress; mood and affect are within normal limits. ? ?A focused examination was performed including R lower leg. Relevant physical exam findings are noted in the Assessment and Plan. ? ?R medial calf inferior ?Pink bx site with surrounding pink scaly plaque ? ?R medial calf superior ?Pink bx site with surrounding pink scaly plaque ? ? ? 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 ?Destruction of lesion ? ?Destruction method: electrodesiccation and curettage   ?Informed consent: discussed and consent obtained   ?Timeout:  patient name, date of birth, surgical site, and procedure verified ?Anesthesia: the lesion was anesthetized in a standard fashion   ?Anesthetic:  1% lidocaine w/ epinephrine 1-100,000 buffered w/ 8.4% NaHCO3 ?Curettage performed in three different directions: Yes   ?Electrodesiccation performed over the curetted area: Yes   ?Curettage cycles:  2 ?Final wound size (cm):  2.8 ?Hemostasis achieved with:  pressure, aluminum chloride and electrodesiccation ?Outcome: patient tolerated procedure well with no complications   ?Post-procedure details: wound care instructions given   ?Additional details:  Post txt defect 2.8 x 2.5cm ? ?mupirocin ointment (BACTROBAN) 2 % ?Apply 1 application. topically daily. Qd to wound on right calf until healed ? ?R medial calf superior ? ?Destruction of lesion ? ?Destruction method: electrodesiccation and curettage   ?Informed consent: discussed and consent obtained    ?Timeout:  patient name, date of birth, surgical site, and procedure verified ?Anesthesia: the lesion was anesthetized in a standard fashion   ?Anesthetic:  1% lidocaine w/ epinephrine 1-100,000 buffered w/ 8.4% NaHCO3 ?Curettage performed in three different directions: Yes   ?Electrodesiccation performed over the curetted area: Yes   ?Curettage cycles:  2 ?Final wound size (cm):  2.1 ?Hemostasis achieved with:  pressure, aluminum chloride and electrodesiccation ?Outcome: patient tolerated procedure well with no complications   ?Post-procedure details: wound care instructions given   ?Additional details:  Post txt defect 2.1 x 1.3cm ? ?BX proven ?EDC today to both sites ?Start Mupirocin oint qd to wounds and cover ?Recommend compression socks qd ? ?Actinic Damage ?- chronic, secondary to cumulative UV radiation exposure/sun exposure over time ?- diffuse scaly erythematous macules with underlying dyspigmentation ?- Recommend daily broad spectrum sunscreen SPF 30+ to sun-exposed areas, reapply every 2 hours as needed.  ?- Recommend staying in the shade or wearing long sleeves, sun glasses (UVA+UVB protection) and wide brim hats (4-inch brim around the entire circumference of the hat). ?- Call for new or changing lesions.  ? ?Return in about 4 weeks (around 02/20/2022) for recheck [facility] IS EDC sites. ? ?I, Othelia Pulling, RMA, am acting as scribe for Brendolyn Patty, MD . ? ?Documentation: I have reviewed the above documentation for accuracy and completeness, and I agree with the above. ? ?Brendolyn Patty MD  ? ?

## 2022-01-24 ENCOUNTER — Ambulatory Visit: Payer: Medicare HMO | Admitting: Dermatology

## 2022-01-30 ENCOUNTER — Other Ambulatory Visit: Payer: Self-pay | Admitting: Family Medicine

## 2022-01-31 ENCOUNTER — Telehealth: Payer: Self-pay | Admitting: Family Medicine

## 2022-01-31 NOTE — Telephone Encounter (Signed)
Please advise, lisinopril is not on current medication list.  ?

## 2022-01-31 NOTE — Telephone Encounter (Signed)
Copied from [place] [id number]. Topic: Quick Communication - Rx Refill/Question ?>> Jan 30, 2022  4:58 PM Pawlus, Brayton Layman A wrote: ?Pt was confused regarding his current medications, pt was not sure if he was supposed to be taking Lisinopril as well,  please advise. ?

## 2022-02-01 ENCOUNTER — Telehealth: Payer: Self-pay | Admitting: Family Medicine

## 2022-02-01 NOTE — Telephone Encounter (Signed)
Spoke to patient wife, patient needs refill on medication. Last report states not taking. Should patient be taking medication?  ?

## 2022-02-01 NOTE — Telephone Encounter (Signed)
Copied from [place] [id number]. Topic: General - Inquiry ?>> Feb 01, 2022  9:14 AM Brian [name] wrote: ?Reason for CRM: clopidogrel (PLAVIX) 75 MG tablet 90 tablet 3 06/15/2021   ?Sig - Route: Take 1 tablet (75 mg total) by mouth daily. - Oral  ?Patient not taking: Reported on 11/21/2021      ?Sent to pharmacy as: clopidogrel (PLAVIX) 75 MG tablet  ?E-Prescribing Status: Receipt confirmed by pharmacy (06/15/2021 ?9:59 AM   ?Pt wife Brian [name] wanted to speak with Brian [name] 's nurse, they had requested the above med and even as stated pt was no longer taking he has been. Wife says he takes every day, They want a fu to know if this should have been stopped as refill was denied. Pls fu with Brian [name] at [phone] ?

## 2022-02-01 NOTE — Telephone Encounter (Signed)
Requested medications are due for refill today.  Unsure if still taking- too soon for refill ? ?Requested medications are on the active medications list.  yes ? ?Last refill. 06/15/2021 #90 3 refills ? ?Future visit scheduled.   yes ? ?Notes to clinic.  Pt reported no longer taking 11/2021 - please advise ? ? ? ?Requested Prescriptions  ?Pending Prescriptions Disp Refills  ? clopidogrel (PLAVIX) 75 MG tablet [Pharmacy Med Name: Clopidogrel Bisulfate 75 MG Oral Tablet] 90 tablet 0  ?  Sig: Take 1 tablet by mouth once daily  ?  ? Hematology: Antiplatelets - clopidogrel Passed - 01/30/2022  2:38 PM  ?  ?  Passed - HCT in normal range and within 180 days  ?  Hematocrit  ?Date Value Ref Range Status  ?12/19/2021 43.6 37.5 - 51.0 % Final  ?  ?  ?  ?  Passed - HGB in normal range and within 180 days  ?  Hemoglobin  ?Date Value Ref Range Status  ?12/19/2021 14.7 13.0 - 17.7 g/dL Final  ?  ?  ?  ?  Passed - PLT in normal range and within 180 days  ?  Platelets  ?Date Value Ref Range Status  ?12/19/2021 165 150 - 450 x10E3/uL Final  ?  ?  ?  ?  Passed - Cr in normal range and within 360 days  ?  Creatinine  ?Date Value Ref Range Status  ?04/15/2012 0.80 0.60 - 1.30 mg/dL Final  ? ?Creatinine, Ser  ?Date Value Ref Range Status  ?12/19/2021 0.79 0.76 - 1.27 mg/dL Final  ?  ?  ?  ?  Passed - Valid encounter within last 6 months  ?  Recent Outpatient Visits   ? ?      ? 1 month ago Routine general medical examination at a health care facility  ? [place] P, DO  ? 2 months ago Diarrhea, unspecified type  ? [place], [place] P, DO  ? 7 months ago Benign hypertensive renal disease  ? [place], DO  ? 1 year ago Routine general medical examination at a health care facility  ? [place], [place] P, DO  ? 1 year ago Benign hypertensive renal disease  ? [place], [place] P, DO  ? ?  ?  ?Future Appointments   ? ?         ? In 3 weeks Brendolyn Patty, MD [place]  ? In 4 months Wynetta Emery, Barb Merino, DO [place]  ? In 4 months  [facility]  ? In 6 months Patwardhan, Reynold Bowen, MD [facility]  ? ?  ? ?  ?  ?  ?  ?

## 2022-02-02 NOTE — Telephone Encounter (Signed)
He should not need a refill on this- a years supply was sent in in August. He should be taking plavix.  ?

## 2022-02-02 NOTE — Telephone Encounter (Signed)
Spoke with patient wife and notified her that [place] does not want the patient to take any of the Lisinopril prescription and if she has any medication left over from prior prescriptions, that she would like to have any extra Lisinopril to be discarded. Patient wife verbalized understanding and has no further questions at this time. Patient wife was also made aware that patient should not need any refills on his Plavix as a year supply was sent over back in August. Patient wife verbalized understanding.  ?

## 2022-02-02 NOTE — Telephone Encounter (Signed)
HE IS NOT TO TAKE THE LISINOPRIL. This was written on his after visit summary. Any lisinopril at home should be thrown out and please confirm that he doesn't have any old Rxs for it at the pharmacy- if he does, please cancel them.  ?

## 2022-02-27 ENCOUNTER — Ambulatory Visit: Payer: Medicare HMO | Admitting: Dermatology

## 2022-02-27 DIAGNOSIS — Z86007 Personal history of in-situ neoplasm of skin: Secondary | ICD-10-CM | POA: Diagnosis not present

## 2022-02-27 DIAGNOSIS — L578 Other skin changes due to chronic exposure to nonionizing radiation: Secondary | ICD-10-CM | POA: Diagnosis not present

## 2022-02-27 DIAGNOSIS — L0889 Other specified local infections of the skin and subcutaneous tissue: Secondary | ICD-10-CM

## 2022-02-27 DIAGNOSIS — T8149XA Infection following a procedure, other surgical site, initial encounter: Secondary | ICD-10-CM

## 2022-02-27 DIAGNOSIS — L089 Local infection of the skin and subcutaneous tissue, unspecified: Secondary | ICD-10-CM

## 2022-02-27 DIAGNOSIS — T148XXA Other injury of unspecified body region, initial encounter: Secondary | ICD-10-CM | POA: Diagnosis not present

## 2022-02-27 NOTE — Patient Instructions (Addendum)
Wound Care Instructions ? ?Cleanse wound gently with soap and water twice a day and gently remove crust,  then pat dry with clean bandage. Apply a thick coat of Mupirocin 2% Ointment over the wound twice a day.  ? ?Cover the wound with fresh, clean, nonstick gauze and secure with paper tape. You may use Band-Aids in place of gauze and tape if the would is small enough, but would recommend trimming much of the tape off as there is often too much. Sometimes Band-Aids can irritate the skin. ? ?If you experience any problems, such as abnormal amounts of bleeding, swelling, significant bruising, significant pain, or evidence of infection, please call the office immediately. ? ?FOR ADULT SURGERY PATIENTS: If you need something for pain relief you may take 1 extra strength Tylenol (acetaminophen) AND 2 Ibuprofen ('200mg'$  each) together every 4 hours as needed for pain. (do not take these if you are allergic to them or if you have a reason you should not take them.) Typically, you may only need pain medication for 1 to 3 days.  ? ?If You Need Anything After Your Visit ? ?If you have any questions or concerns for your doctor, please call our main line at [phone] and press option 4 to reach your doctor's medical assistant. If no one answers, please leave a voicemail as directed and we will return your call as soon as possible. Messages left after 4 pm will be answered the following business day.  ? ?You may also send [place] a message via MyChart. We typically respond to MyChart messages within 1-2 business days. ? ?For prescription refills, please ask your pharmacy to contact our office. Our fax number is [phone]. ? ?If you have an urgent issue when the clinic is closed that cannot wait until the next business day, you can page your doctor at the number below.   ? ?Please note that while we do our best to be available for urgent issues outside of office hours, we are not available 24/7.  ? ?If you have an urgent issue and  are unable to reach [place], you may choose to seek medical care at your doctor's office, retail clinic, urgent care center, or emergency room. ? ?If you have a medical emergency, please immediately call 911 or go to the emergency department. ? ?Pager Numbers ? ?- Dr. Nehemiah Massed: [phone] ? ?- Dr. Laurence Ferrari: [phone] ? ?- Dr. Nicole Kindred: [phone] ? ?In the event of inclement weather, please call our main line at [phone] for an update on the status of any delays or closures. ? ?Dermatology Medication Tips: ?Please keep the boxes that topical medications come in in order to help keep track of the instructions about where and how to use these. Pharmacies typically print the medication instructions only on the boxes and not directly on the medication tubes.  ? ?If your medication is too expensive, please contact our office at [phone] option 4 or send [place] a message through [place].  ? ?We are unable to tell what your co-pay for medications will be in advance as this is different depending on your insurance coverage. However, we may be able to find a substitute medication at lower cost or fill out paperwork to get insurance to cover a needed medication.  ? ?If a prior authorization is required to get your medication covered by your insurance company, please allow [place] 1-2 business days to complete this process. ? ?Drug prices often vary depending on where the prescription is filled and some pharmacies  may offer cheaper prices. ? ?The website www.goodrx.com contains coupons for medications through different pharmacies. The prices here do not account for what the cost may be with help from insurance (it may be cheaper with your insurance), but the website can give you the price if you did not use any insurance.  ?- You can print the associated coupon and take it with your prescription to the pharmacy.  ?- You may also stop by our office during regular business hours and pick up a GoodRx coupon card.  ?- If you need your  prescription sent electronically to a different pharmacy, notify our office through [facility] or by phone at [phone] option 4. ? ? ? ? ?Si Usted Necesita Algo Despu?s de Su Visita ? ?Tambi?n puede enviarnos un mensaje a trav?s de MyChart. Por lo general respondemos a los mensajes de MyChart en el transcurso de 1 a 2 d?as h?biles. ? ?Para renovar recetas, por favor pida a su farmacia que se ponga en contacto con nuestra oficina. Nuestro n?mero de fax es el [phone]. ? ?Si tiene un asunto urgente cuando la cl?nica est? cerrada y que no puede esperar hasta el siguiente d?a h?bil, puede llamar/localizar a su doctor(a) al n?mero que aparece a continuaci?n.  ? ?Por favor, tenga en cuenta que aunque hacemos todo lo posible para estar disponibles para asuntos urgentes fuera del horario de oficina, no estamos disponibles las 24 horas del d?a, los 7 d?as de la semana.  ? ?Si tiene un problema urgente y no puede comunicarse con nosotros, puede optar por buscar atenci?n m?dica  en el consultorio de su doctor(a), en una cl?nica privada, en un centro de atenci?n urgente o en una sala de emergencias. ? ?Si tiene Engineer, maintenance (IT) m?dica, por favor llame inmediatamente al 911 o vaya a la sala de emergencias. ? ?N?meros de b?per ? ?- Dr. Nehemiah Massed: [phone] ? ?- Dra. Moye: [phone] ? ?- Dra. Nicole Kindred: [phone] ? ?En caso de inclemencias del tiempo, por favor llame a nuestra l?nea principal al [phone] para una actualizaci?n sobre el estado de cualquier retraso o cierre. ? ?Consejos para la medicaci?n en dermatolog?a: ?Por favor, guarde las cajas en las que vienen los medicamentos de uso t?pico para ayudarle a seguir las instrucciones sobre d?nde y c?mo usarlos. Las farmacias generalmente imprimen las instrucciones del medicamento s?lo en las cajas y no directamente en los tubos del [place].  ? ?Si su medicamento es muy caro, por favor, p?ngase en contacto con Zigmund Daniel llamando al [phone]  y presione la opci?n 4 o env?enos un mensaje a trav?s de MyChart.  ? ?No podemos decirle cu?l ser? su copago por los medicamentos por adelantado ya que esto es diferente dependiendo de la cobertura de su seguro. Sin embargo, es posible que podamos encontrar un medicamento sustituto a Electrical engineer un formulario para que el seguro cubra el medicamento que se considera necesario.  ? ?Si se requiere Ardelia Mems autorizaci?n previa para que su compa??a de seguros [place] su medicamento, por favor perm?tanos de 1 a 2 d?as h?biles para completar este proceso. ? ?Los precios de los medicamentos var?an con frecuencia dependiendo del Environmental consultant de d?nde se surte la receta y alguna farmacias pueden ofrecer precios m?s baratos. ? ?El sitio web www.goodrx.com tiene cupones para medicamentos de Airline pilot. Los precios aqu? no tienen en cuenta lo que podr?a costar con la ayuda del seguro (puede ser m?s barato con su seguro), pero el sitio web puede darle el precio si no  utiliz? ning?n seguro.  ?- Puede imprimir el cup?n correspondiente y llevarlo con su receta a la farmacia.  ?- Tambi?n puede pasar por nuestra oficina durante el horario de atenci?n regular y recoger una tarjeta de cupones de GoodRx.  ?- Si necesita que su receta se env?e electr?nicamente a Chiropodist, informe a nuestra oficina a trav?s de MyChart de [place] o por tel?fono llamando al [phone] y presione la opci?n 4. ? ?

## 2022-02-27 NOTE — Progress Notes (Signed)
? ?  Follow-Up Visit ?  ?Subjective  ?Brian [name] is a 88 y.o. male who presents for the following: Follow-up. ? ?Patient here for 4 month follow-up. He has a history of Squamous Cell Carcinoma in situ of the right medial calf superior and inferior, treated with [facility] 01/23/22. Healing well, per patient.  ? ?The following portions of the chart were reviewed this encounter and updated as appropriate:  ?  ?  ? ?Review of Systems:  No other skin or systemic complaints except as noted in HPI or Assessment and Plan. ? ?Objective  ?Well appearing patient in no apparent distress; mood and affect are within normal limits. ? ?A focused examination was performed including face, right leg. Relevant physical exam findings are noted in the Assessment and Plan. ? ?right medial calf superior; right medial calf inferior ?Healing EDC sites with possible wound infection.  ? ?right medial calf superior; right medial calf inferior ?Yellow/gray brown crust with mild surrounding erythema at [facility] sites ? ? ? ? ? ? ?Assessment & Plan  ?Actinic Damage ?- chronic, secondary to cumulative UV radiation exposure/sun exposure over time ?- diffuse scaly erythematous macules with underlying dyspigmentation ?- Recommend daily broad spectrum sunscreen SPF 30+ to sun-exposed areas, reapply every 2 hours as needed.  ?- Recommend staying in the shade or wearing long sleeves, sun glasses (UVA+UVB protection) and wide brim hats (4-inch brim around the entire circumference of the hat). ?- Call for new or changing lesions. ? ?History of squamous cell carcinoma in situ (SCCIS) of skin ?right medial calf superior; right medial calf inferior ? ?Healing. Observe for recurrence. Call clinic for new or changing lesions.  Recommend regular skin exams, daily broad-spectrum spf 30+ sunscreen use, and photoprotection.   ? ?Wound infection ?right medial calf superior; right medial calf inferior ? ?Bacterial culture performed today.  ? ?Continue wound care, wash and  gently remove crusts from sites with wet cloth twice daily, followed by thick layer mupirocin 2% ointment and bandage.  ? ?Anaerobic and Aerobic Culture - right medial calf superior; right medial calf inferior ? ? ?Return in about 2 weeks (around 03/13/2022) for wound check. ? ?I, Jamesetta Orleans, CMA, am acting as scribe for Brendolyn Patty, MD . ? ?Documentation: I have reviewed the above documentation for accuracy and completeness, and I agree with the above. ? ?Brendolyn Patty MD  ? ?

## 2022-03-04 LAB — ANAEROBIC AND AEROBIC CULTURE

## 2022-03-06 ENCOUNTER — Telehealth: Payer: Self-pay

## 2022-03-06 NOTE — Telephone Encounter (Signed)
Lft pt msg to call for culture result/sh ?

## 2022-03-06 NOTE — Telephone Encounter (Signed)
-----   Message from Brendolyn Patty, MD sent at 03/06/2022  1:37 PM EDT ----- ?Bacterial culture is negative, no infection ? ? - please call patient ?

## 2022-03-06 NOTE — Telephone Encounter (Signed)
Patient's spouse advised of culture results. aw ?

## 2022-03-13 ENCOUNTER — Ambulatory Visit: Payer: Medicare HMO | Admitting: Dermatology

## 2022-03-13 ENCOUNTER — Other Ambulatory Visit: Payer: Self-pay

## 2022-03-13 ENCOUNTER — Telehealth: Payer: Self-pay | Admitting: Cardiology

## 2022-03-13 DIAGNOSIS — D0471 Carcinoma in situ of skin of right lower limb, including hip: Secondary | ICD-10-CM

## 2022-03-13 DIAGNOSIS — I2510 Atherosclerotic heart disease of native coronary artery without angina pectoris: Secondary | ICD-10-CM

## 2022-03-13 MED ORDER — METOPROLOL SUCCINATE ER 25 MG PO TB24: 25.0000 mg | ORAL_TABLET | Freq: Every day | ORAL | 1 refills | Status: DC

## 2022-03-13 NOTE — Telephone Encounter (Signed)
Refill has been sent.  °

## 2022-03-13 NOTE — Telephone Encounter (Signed)
metoprolol succinate (TOPROL-XL) 25 MG 24 hr tablet ?[facility] [place], [place]  [phone] ?

## 2022-03-13 NOTE — Patient Instructions (Signed)

## 2022-03-13 NOTE — Progress Notes (Signed)
? ?  Follow-Up Visit ?  ?Subjective  ?Brian [name] is a 88 y.o. male who presents for the following: recheck wounds (R med calf sup, R med calf inferior, Mupirocin oint qd). ? ? ?The following portions of the chart were reviewed this encounter and updated as appropriate:  ?  ?  ? ?Review of Systems:  No other skin or systemic complaints except as noted in HPI or Assessment and Plan. ? ?Objective  ?Well appearing patient in no apparent distress; mood and affect are within normal limits. ? ?A focused examination was performed including R lower leg. Relevant physical exam findings are noted in the Assessment and Plan. ? ?R med calf sup, R med calf inferior ?Superifcial ulceration with mild crusting R med calf sup, R med calf inferior ? ? ? ?Assessment & Plan  ?Squamous cell carcinoma in situ (SCCIS) of skin of right lower leg ?R med calf sup, R med calf inferior ? ?S/P EDC 01/23/22, healing well ?Bacterial culture negative ? ?Cont Mupirocin oint qd until healed ? ? ?Return for as scheduled with Dr. Laurence Ferrari. ? ?I, Othelia Pulling, RMA, am acting as scribe for Brendolyn Patty, MD . ? ?Documentation: I have reviewed the above documentation for accuracy and completeness, and I agree with the above. ? ?Brendolyn Patty MD  ? ?

## 2022-03-15 DIAGNOSIS — I2510 Atherosclerotic heart disease of native coronary artery without angina pectoris: Secondary | ICD-10-CM | POA: Diagnosis not present

## 2022-03-15 DIAGNOSIS — Z809 Family history of malignant neoplasm, unspecified: Secondary | ICD-10-CM | POA: Diagnosis not present

## 2022-03-15 DIAGNOSIS — Z720 Tobacco use: Secondary | ICD-10-CM | POA: Diagnosis not present

## 2022-03-15 DIAGNOSIS — E785 Hyperlipidemia, unspecified: Secondary | ICD-10-CM | POA: Diagnosis not present

## 2022-03-15 DIAGNOSIS — R030 Elevated blood-pressure reading, without diagnosis of hypertension: Secondary | ICD-10-CM | POA: Diagnosis not present

## 2022-03-15 DIAGNOSIS — N529 Male erectile dysfunction, unspecified: Secondary | ICD-10-CM | POA: Diagnosis not present

## 2022-03-15 DIAGNOSIS — I252 Old myocardial infarction: Secondary | ICD-10-CM | POA: Diagnosis not present

## 2022-03-15 DIAGNOSIS — Z7902 Long term (current) use of antithrombotics/antiplatelets: Secondary | ICD-10-CM | POA: Diagnosis not present

## 2022-03-15 DIAGNOSIS — M1990 Unspecified osteoarthritis, unspecified site: Secondary | ICD-10-CM | POA: Diagnosis not present

## 2022-03-15 DIAGNOSIS — R69 Illness, unspecified: Secondary | ICD-10-CM | POA: Diagnosis not present

## 2022-03-15 DIAGNOSIS — Z8249 Family history of ischemic heart disease and other diseases of the circulatory system: Secondary | ICD-10-CM | POA: Diagnosis not present

## 2022-03-15 DIAGNOSIS — K219 Gastro-esophageal reflux disease without esophagitis: Secondary | ICD-10-CM | POA: Diagnosis not present

## 2022-03-16 DIAGNOSIS — F1110 Opioid abuse, uncomplicated: Secondary | ICD-10-CM | POA: Diagnosis not present

## 2022-03-16 DIAGNOSIS — Z79899 Other long term (current) drug therapy: Secondary | ICD-10-CM | POA: Diagnosis not present

## 2022-05-08 DIAGNOSIS — F17210 Nicotine dependence, cigarettes, uncomplicated: Secondary | ICD-10-CM | POA: Diagnosis not present

## 2022-05-08 DIAGNOSIS — M25511 Pain in right shoulder: Secondary | ICD-10-CM | POA: Diagnosis not present

## 2022-05-08 DIAGNOSIS — Z79899 Other long term (current) drug therapy: Secondary | ICD-10-CM | POA: Diagnosis not present

## 2022-05-08 DIAGNOSIS — R69 Illness, unspecified: Secondary | ICD-10-CM | POA: Diagnosis not present

## 2022-05-08 DIAGNOSIS — M545 Low back pain, unspecified: Secondary | ICD-10-CM | POA: Diagnosis not present

## 2022-05-08 DIAGNOSIS — G894 Chronic pain syndrome: Secondary | ICD-10-CM | POA: Diagnosis not present

## 2022-05-08 DIAGNOSIS — M79606 Pain in leg, unspecified: Secondary | ICD-10-CM | POA: Diagnosis not present

## 2022-06-19 ENCOUNTER — Ambulatory Visit (INDEPENDENT_AMBULATORY_CARE_PROVIDER_SITE_OTHER): Payer: Medicare HMO | Admitting: Family Medicine

## 2022-06-19 ENCOUNTER — Encounter: Payer: Self-pay | Admitting: Family Medicine

## 2022-06-19 VITALS — BP 116/72 | HR 49 | Temp 98.2°F | Ht 67.0 in | Wt 128.9 lb

## 2022-06-19 DIAGNOSIS — E782 Mixed hyperlipidemia: Secondary | ICD-10-CM | POA: Diagnosis not present

## 2022-06-19 DIAGNOSIS — I119 Hypertensive heart disease without heart failure: Secondary | ICD-10-CM | POA: Diagnosis not present

## 2022-06-19 DIAGNOSIS — I129 Hypertensive chronic kidney disease with stage 1 through stage 4 chronic kidney disease, or unspecified chronic kidney disease: Secondary | ICD-10-CM

## 2022-06-19 MED ORDER — ATORVASTATIN CALCIUM 80 MG PO TABS
ORAL_TABLET | ORAL | 1 refills | Status: DC
Start: 2022-06-19 — End: 2022-12-20

## 2022-06-19 MED ORDER — CLOPIDOGREL BISULFATE 75 MG PO TABS: 75.0000 mg | ORAL_TABLET | Freq: Every day | ORAL | 0 refills | Status: DC

## 2022-06-19 MED ORDER — NITROGLYCERIN 0.4 MG SL SUBL: SUBLINGUAL_TABLET | SUBLINGUAL | 12 refills | Status: DC

## 2022-06-19 NOTE — Assessment & Plan Note (Signed)
Under good control on current regimen. Continue current regimen. Continue to monitor. Call with any concerns. Refills given. Labs drawn today.   

## 2022-06-19 NOTE — Progress Notes (Signed)
BP 116/72   Pulse (!) 49   Temp 98.2 F (36.8 C) (Oral)   Ht '5\' 7"'$  (1.702 m)   Wt 128 lb 14.4 oz (58.5 kg)   SpO2 95%   BMI 20.19 kg/m    Subjective:    Patient ID: Brian [name], male    DOB: Jan 25, 1933, 88 y.o.   MRN: [id number]  HPI: Brian [name] is a 88 y.o. male  Chief Complaint  Patient presents with   Hypertension   Hyperlipidemia   HYPERTENSION / [place] Satisfied with current treatment? yes Duration of hypertension: chronic BP monitoring frequency: not checking BP medication side effects: no Past BP meds: metoprolol Duration of hyperlipidemia: chronic Cholesterol medication side effects: no Cholesterol supplements: none Past cholesterol medications: atorvastatin Medication compliance: excellent compliance Aspirin: no Recent stressors: no Recurrent headaches: no Visual changes: no Palpitations: no Dyspnea: no Chest pain: no Lower extremity edema: no Dizzy/lightheaded: yes  Relevant past medical, surgical, family and social history reviewed and updated as indicated. Interim medical history since our last visit reviewed. Allergies and medications reviewed and updated.  Review of Systems  Constitutional: Negative.   Respiratory: Negative.    Cardiovascular: Negative.   Gastrointestinal: Negative.   Musculoskeletal: Negative.   Neurological: Negative.   Psychiatric/Behavioral: Negative.      Per HPI unless specifically indicated above     Objective:    BP 116/72   Pulse (!) 49   Temp 98.2 F (36.8 C) (Oral)   Ht '5\' 7"'$  (1.702 m)   Wt 128 lb 14.4 oz (58.5 kg)   SpO2 95%   BMI 20.19 kg/m   Wt Readings from Last 3 Encounters:  06/19/22 128 lb 14.4 oz (58.5 kg)  12/19/21 124 lb (56.2 kg)  11/21/21 124 lb (56.2 kg)    Physical Exam Vitals and nursing note reviewed.  Constitutional:      General: He is not in acute distress.    Appearance: Normal appearance. He is not ill-appearing, toxic-appearing or diaphoretic.  HENT:      Head: Normocephalic and atraumatic.     Right Ear: External ear normal.     Left Ear: External ear normal.     Nose: Nose normal.     Mouth/Throat:     Mouth: Mucous membranes are moist.     Pharynx: Oropharynx is clear.  Eyes:     General: No scleral icterus.       Right eye: No discharge.        Left eye: No discharge.     Extraocular Movements: Extraocular movements intact.     Conjunctiva/sclera: Conjunctivae normal.     Pupils: Pupils are equal, round, and reactive to light.  Cardiovascular:     Rate and Rhythm: Normal rate and regular rhythm.     Pulses: Normal pulses.     Heart sounds: Normal heart sounds. No murmur heard.    No friction rub. No gallop.  Pulmonary:     Effort: Pulmonary effort is normal. No respiratory distress.     Breath sounds: Normal breath sounds. No stridor. No wheezing, rhonchi or rales.  Chest:     Chest wall: No tenderness.  Musculoskeletal:        General: Normal range of motion.     Cervical back: Normal range of motion and neck supple.  Skin:    General: Skin is warm and dry.     Capillary Refill: Capillary refill takes less than 2 seconds.     Coloration: Skin  is not jaundiced or pale.     Findings: No bruising, erythema, lesion or rash.  Neurological:     General: No focal deficit present.     Mental Status: He is alert and oriented to person, place, and time. Mental status is at baseline.  Psychiatric:        Mood and Affect: Mood normal.        Behavior: Behavior normal.        Thought Content: Thought content normal.        Judgment: Judgment normal.     Results for orders placed or performed in visit on 02/27/22  Anaerobic and Aerobic Culture   Specimen: Leg, Right; Skin   Skin Right medial calf  Result Value Ref Range   Anaerobic Culture Final report    Result 1 Comment    Aerobic Culture Final report    Result 1 Comment       Assessment & Plan:   Problem List Items Addressed This Visit       Cardiovascular and  Mediastinum   Hypertension with heart disease - Primary    Under good control on current regimen. Continue current regimen. Continue to monitor. Call with any concerns. Refills given. Labs drawn today.        Relevant Medications   nitroGLYCERIN (NITROSTAT) 0.4 MG SL tablet   atorvastatin (LIPITOR) 80 MG tablet   Other Relevant Orders   Comprehensive metabolic panel     Genitourinary   Benign hypertensive renal disease    Under good control on current regimen. Continue current regimen. Continue to monitor. Call with any concerns. Refills given. Labs drawn today.        Relevant Orders   Comprehensive metabolic panel   CBC with Differential/Platelet     Other   Mixed hyperlipidemia    Under good control on current regimen. Continue current regimen. Continue to monitor. Call with any concerns. Refills given. Labs drawn today.        Relevant Medications   nitroGLYCERIN (NITROSTAT) 0.4 MG SL tablet   atorvastatin (LIPITOR) 80 MG tablet   Other Relevant Orders   Comprehensive metabolic panel   Lipid Panel w/o Chol/HDL Ratio     Follow up plan: Return in about 6 months (around 12/20/2022) for physical.

## 2022-06-20 LAB — CBC WITH DIFFERENTIAL/PLATELET
Basophils Absolute: 0.1 10*3/uL (ref 0.0–0.2)
Basos: 1 %
EOS (ABSOLUTE): 0.4 10*3/uL (ref 0.0–0.4)
Eos: 6 %
Hematocrit: 41 % (ref 37.5–51.0)
Hemoglobin: 13.7 g/dL (ref 13.0–17.7)
Immature Grans (Abs): 0 10*3/uL (ref 0.0–0.1)
Immature Granulocytes: 0 %
Lymphocytes Absolute: 2.7 10*3/uL (ref 0.7–3.1)
Lymphs: 42 %
MCH: 32 pg (ref 26.6–33.0)
MCHC: 33.4 g/dL (ref 31.5–35.7)
MCV: 96 fL (ref 79–97)
Monocytes Absolute: 0.6 10*3/uL (ref 0.1–0.9)
Monocytes: 9 %
Neutrophils Absolute: 2.7 10*3/uL (ref 1.4–7.0)
Neutrophils: 42 %
Platelets: 146 10*3/uL — ABNORMAL LOW (ref 150–450)
RBC: 4.28 x10E6/uL (ref 4.14–5.80)
RDW: 12.8 % (ref 11.6–15.4)
WBC: 6.4 10*3/uL (ref 3.4–10.8)

## 2022-06-20 LAB — COMPREHENSIVE METABOLIC PANEL
ALT: 13 IU/L (ref 0–44)
AST: 12 IU/L (ref 0–40)
Albumin/Globulin Ratio: 1.7 (ref 1.2–2.2)
Albumin: 4 g/dL (ref 3.7–4.7)
Alkaline Phosphatase: 75 IU/L (ref 44–121)
BUN/Creatinine Ratio: 11 (ref 10–24)
BUN: 9 mg/dL (ref 8–27)
Bilirubin Total: 0.6 mg/dL (ref 0.0–1.2)
CO2: 24 mmol/L (ref 20–29)
Calcium: 9 mg/dL (ref 8.6–10.2)
Chloride: 103 mmol/L (ref 96–106)
Creatinine, Ser: 0.79 mg/dL (ref 0.76–1.27)
Globulin, Total: 2.3 g/dL (ref 1.5–4.5)
Glucose: 109 mg/dL — ABNORMAL HIGH (ref 70–99)
Potassium: 4 mmol/L (ref 3.5–5.2)
Sodium: 139 mmol/L (ref 134–144)
Total Protein: 6.3 g/dL (ref 6.0–8.5)
eGFR: 86 mL/min/{1.73_m2} (ref >59)

## 2022-06-20 LAB — LIPID PANEL W/O CHOL/HDL RATIO
Cholesterol, Total: 127 mg/dL (ref 100–199)
HDL: 67 mg/dL (ref >39)
LDL Chol Calc (NIH): 45 mg/dL (ref 0–99)
Triglycerides: 72 mg/dL (ref 0–149)
VLDL Cholesterol Cal: 15 mg/dL (ref 5–40)

## 2022-06-21 ENCOUNTER — Encounter: Payer: Self-pay | Admitting: Family Medicine

## 2022-06-21 ENCOUNTER — Encounter: Payer: Self-pay | Admitting: Dermatology

## 2022-06-21 ENCOUNTER — Ambulatory Visit (INDEPENDENT_AMBULATORY_CARE_PROVIDER_SITE_OTHER): Payer: Medicare HMO | Admitting: Dermatology

## 2022-06-21 DIAGNOSIS — Z1283 Encounter for screening for malignant neoplasm of skin: Secondary | ICD-10-CM | POA: Diagnosis not present

## 2022-06-21 DIAGNOSIS — Z85828 Personal history of other malignant neoplasm of skin: Secondary | ICD-10-CM

## 2022-06-21 DIAGNOSIS — L821 Other seborrheic keratosis: Secondary | ICD-10-CM | POA: Diagnosis not present

## 2022-06-21 DIAGNOSIS — D039 Melanoma in situ, unspecified: Secondary | ICD-10-CM

## 2022-06-21 DIAGNOSIS — C44212 Basal cell carcinoma of skin of right ear and external auricular canal: Secondary | ICD-10-CM

## 2022-06-21 DIAGNOSIS — L570 Actinic keratosis: Secondary | ICD-10-CM

## 2022-06-21 DIAGNOSIS — D1800 Hemangioma unspecified site: Secondary | ICD-10-CM

## 2022-06-21 DIAGNOSIS — L578 Other skin changes due to chronic exposure to nonionizing radiation: Secondary | ICD-10-CM

## 2022-06-21 DIAGNOSIS — D492 Neoplasm of unspecified behavior of bone, soft tissue, and skin: Secondary | ICD-10-CM

## 2022-06-21 DIAGNOSIS — D229 Melanocytic nevi, unspecified: Secondary | ICD-10-CM

## 2022-06-21 DIAGNOSIS — L814 Other melanin hyperpigmentation: Secondary | ICD-10-CM

## 2022-06-21 DIAGNOSIS — D0339 Melanoma in situ of other parts of face: Secondary | ICD-10-CM | POA: Diagnosis not present

## 2022-06-21 DIAGNOSIS — C4491 Basal cell carcinoma of skin, unspecified: Secondary | ICD-10-CM

## 2022-06-21 HISTORY — DX: Melanoma in situ, unspecified: D03.9

## 2022-06-21 HISTORY — DX: Basal cell carcinoma of skin, unspecified: C44.91

## 2022-06-21 NOTE — Patient Instructions (Addendum)
Cryotherapy Aftercare  Wash gently with soap and water everyday.   Apply Vaseline and Band-Aid daily until healed.    Wound Care Instructions  Cleanse wound gently with soap and water once a day then pat dry with clean gauze. Apply a thin coat of Petrolatum (petroleum jelly, "Vaseline") over the wound (unless you have an allergy to this). We recommend that you use a new, sterile tube of Vaseline. Do not pick or remove scabs. Do not remove the yellow or white "healing tissue" from the base of the wound.  Cover the wound with fresh, clean, nonstick gauze and secure with paper tape. You may use Band-Aids in place of gauze and tape if the wound is small enough, but would recommend trimming much of the tape off as there is often too much. Sometimes Band-Aids can irritate the skin.  You should call the office for your biopsy report after 1 week if you have not already been contacted.  If you experience any problems, such as abnormal amounts of bleeding, swelling, significant bruising, significant pain, or evidence of infection, please call the office immediately.  FOR ADULT SURGERY PATIENTS: If you need something for pain relief you may take 1 extra strength Tylenol (acetaminophen) AND 2 Ibuprofen (200mg each) together every 4 hours as needed for pain. (do not take these if you are allergic to them or if you have a reason you should not take them.) Typically, you may only need pain medication for 1 to 3 days.   Melanoma ABCDEs  Melanoma is the most dangerous type of skin cancer, and is the leading cause of death from skin disease.  You are more likely to develop melanoma if you: Have light-colored skin, light-colored eyes, or red or blond hair Spend a lot of time in the sun Tan regularly, either outdoors or in a tanning bed Have had blistering sunburns, especially during childhood Have a close family member who has had a melanoma Have atypical moles or large birthmarks  Early detection of  melanoma is key since treatment is typically straightforward and cure rates are extremely high if we catch it early.   The first sign of melanoma is often a change in a mole or a new dark spot.  The ABCDE system is a way of remembering the signs of melanoma.  A for asymmetry:  The two halves do not match. B for border:  The edges of the growth are irregular. C for color:  A mixture of colors are present instead of an even brown color. D for diameter:  Melanomas are usually (but not always) greater than 6mm - the size of a pencil eraser. E for evolution:  The spot keeps changing in size, shape, and color.  Please check your skin once per month between visits. You can use a small mirror in front and a large mirror behind you to keep an eye on the back side or your body.   If you see any new or changing lesions before your next follow-up, please call to schedule a visit.  Please continue daily skin protection including broad spectrum sunscreen SPF 30+ to sun-exposed areas, reapplying every 2 hours as needed when you're outdoors.    Due to recent changes in healthcare laws, you may see results of your pathology and/or laboratory studies on MyChart before the doctors have had a chance to review them. We understand that in some cases there may be results that are confusing or concerning to you. Please understand that not all   results are received at the same time and often the doctors may need to interpret multiple results in order to provide you with the best plan of care or course of treatment. Therefore, we ask that you please give us 2 business days to thoroughly review all your results before contacting the office for clarification. Should we see a critical lab result, you will be contacted sooner.   If You Need Anything After Your Visit  If you have any questions or concerns for your doctor, please call our main line at [phone] and press option 4 to reach your doctor's medical assistant. If  no one answers, please leave a voicemail as directed and we will return your call as soon as possible. Messages left after 4 pm will be answered the following business day.   You may also send us a message via MyChart. We typically respond to MyChart messages within 1-2 business days.  For prescription refills, please ask your pharmacy to contact our office. Our fax number is [phone].  If you have an urgent issue when the clinic is closed that cannot wait until the next business day, you can page your doctor at the number below.    Please note that while we do our best to be available for urgent issues outside of office hours, we are not available 24/7.   If you have an urgent issue and are unable to reach us, you may choose to seek medical care at your doctor's office, retail clinic, urgent care center, or emergency room.  If you have a medical emergency, please immediately call 911 or go to the emergency department.  Pager Numbers  - Dr. Kowalski: [phone]  - Dr. Moye: [phone]  - Dr. Stewart: [phone]  In the event of inclement weather, please call our main line at [phone] for an update on the status of any delays or closures.  Dermatology Medication Tips: Please keep the boxes that topical medications come in in order to help keep track of the instructions about where and how to use these. Pharmacies typically print the medication instructions only on the boxes and not directly on the medication tubes.   If your medication is too expensive, please contact our office at [phone] option 4 or send us a message through MyChart.   We are unable to tell what your co-pay for medications will be in advance as this is different depending on your insurance coverage. However, we may be able to find a substitute medication at lower cost or fill out paperwork to get insurance to cover a needed medication.   If a prior authorization is required to get your medication  covered by your insurance company, please allow us 1-2 business days to complete this process.  Drug prices often vary depending on where the prescription is filled and some pharmacies may offer cheaper prices.  The website www.goodrx.com contains coupons for medications through different pharmacies. The prices here do not account for what the cost may be with help from insurance (it may be cheaper with your insurance), but the website can give you the price if you did not use any insurance.  - You can print the associated coupon and take it with your prescription to the pharmacy.  - You may also stop by our office during regular business hours and pick up a GoodRx coupon card.  - If you need your prescription sent electronically to a different pharmacy, notify our office through [facility] MyChart or by phone at [phone]   option 4.     Si Usted Necesita Algo Despus de Su Visita  Tambin puede enviarnos un mensaje a travs de MyChart. Por lo general respondemos a los mensajes de MyChart en el transcurso de 1 a 2 das hbiles.  Para renovar recetas, por favor pida a su farmacia que se ponga en contacto con nuestra oficina. Nuestro nmero de fax es el [phone].  Si tiene un asunto urgente cuando la clnica est cerrada y que no puede esperar hasta el siguiente da hbil, puede llamar/localizar a su doctor(a) al nmero que aparece a continuacin.   Por favor, tenga en cuenta que aunque hacemos todo lo posible para estar disponibles para asuntos urgentes fuera del horario de oficina, no estamos disponibles las 24 horas del da, los 7 das de la semana.   Si tiene un problema urgente y no puede comunicarse con nosotros, puede optar por buscar atencin mdica  en el consultorio de su doctor(a), en una clnica privada, en un centro de atencin urgente o en una sala de emergencias.  Si tiene una emergencia mdica, por favor llame inmediatamente al 911 o vaya a la sala de  emergencias.  Nmeros de bper  - Dr. Kowalski: [phone]  - Dra. Moye: [phone]  - Dra. Stewart: [phone]  En caso de inclemencias del tiempo, por favor llame a nuestra lnea principal al [phone] para una actualizacin sobre el estado de cualquier retraso o cierre.  Consejos para la medicacin en dermatologa: Por favor, guarde las cajas en las que vienen los medicamentos de uso tpico para ayudarle a seguir las instrucciones sobre dnde y cmo usarlos. Las farmacias generalmente imprimen las instrucciones del medicamento slo en las cajas y no directamente en los tubos del medicamento.   Si su medicamento es muy caro, por favor, pngase en contacto con nuestra oficina llamando al [phone] y presione la opcin 4 o envenos un mensaje a travs de MyChart.   No podemos decirle cul ser su copago por los medicamentos por adelantado ya que esto es diferente dependiendo de la cobertura de su seguro. Sin embargo, es posible que podamos encontrar un medicamento sustituto a menor costo o llenar un formulario para que el seguro cubra el medicamento que se considera necesario.   Si se requiere una autorizacin previa para que su compaa de seguros cubra su medicamento, por favor permtanos de 1 a 2 das hbiles para completar este proceso.  Los precios de los medicamentos varan con frecuencia dependiendo del lugar de dnde se surte la receta y alguna farmacias pueden ofrecer precios ms baratos.  El sitio web www.goodrx.com tiene cupones para medicamentos de diferentes farmacias. Los precios aqu no tienen en cuenta lo que podra costar con la ayuda del seguro (puede ser ms barato con su seguro), pero el sitio web puede darle el precio si no utiliz ningn seguro.  - Puede imprimir el cupn correspondiente y llevarlo con su receta a la farmacia.  - Tambin puede pasar por nuestra oficina durante el horario de atencin regular y recoger una tarjeta de cupones de GoodRx.  - Si  necesita que su receta se enve electrnicamente a una farmacia diferente, informe a nuestra oficina a travs de MyChart de [facility] o por telfono llamando al [phone] y presione la opcin 4.  

## 2022-06-21 NOTE — Progress Notes (Signed)
Follow-Up Visit   Subjective  Brian [name] is a 88 y.o. male who presents for the following: FBSE (The patient presents for Total-Body Skin Exam (TBSE) for skin cancer screening and mole check.  The patient has spots, moles and lesions to be evaluated, some may be new or changing and the patient has concerns that these could be cancer. Patient with hx of SCCis.).  Patient accompanied by wife who contributes to history.  The following portions of the chart were reviewed this encounter and updated as appropriate:   Tobacco  Allergies  Meds  Problems  Med Hx  Surg Hx  Fam Hx      Review of Systems:  No other skin or systemic complaints except as noted in HPI or Assessment and Plan.  Objective  Well appearing patient in no apparent distress; mood and affect are within normal limits.  A full examination was performed including scalp, head, eyes, ears, nose, lips, neck, chest, axillae, abdomen, back, buttocks, bilateral upper extremities, bilateral lower extremities, hands, feet, fingers, toes, fingernails, and toenails. All findings within normal limits unless otherwise noted below.  L dorsal hand x 3, L forearm x 2 (5) Erythematous thin papules/macules with gritty scale. Erythematous thin papules/macules with gritty scale.   Right Preauricular 1.5 cm brown and pink thin plaque R/o Atypia vs SK     Right helix 0.6 cm pink papule R/o BCC vs Scar       Assessment & Plan  AK (actinic keratosis) (5) L dorsal hand x 3, L forearm x 2  Actinic keratoses are precancerous spots that appear secondary to cumulative UV radiation exposure/sun exposure over time. They are chronic with expected duration over 1 year. A portion of actinic keratoses will progress to squamous cell carcinoma of the skin. It is not possible to reliably predict which spots will progress to skin cancer and so treatment is recommended to prevent development of skin cancer.  Recommend daily broad spectrum  sunscreen SPF 30+ to sun-exposed areas, reapply every 2 hours as needed.  Recommend staying in the shade or wearing long sleeves, sun glasses (UVA+UVB protection) and wide brim hats (4-inch brim around the entire circumference of the hat). Call for new or changing lesions.  Prior to procedure, discussed risks of blister formation, small wound, skin dyspigmentation, or rare scar following cryotherapy. Recommend Vaseline ointment to treated areas while healing.  Hypertrophic at left dorsal hand  Destruction of lesion - L dorsal hand x 3, L forearm x 2  Destruction method: cryotherapy   Informed consent: discussed and consent obtained   Lesion destroyed using liquid nitrogen: Yes   Cryotherapy cycles:  2 Outcome: patient tolerated procedure well with no complications   Post-procedure details: wound care instructions given    Neoplasm of skin (2) Right Preauricular  Skin / nail biopsy Type of biopsy: tangential   Informed consent: discussed and consent obtained   Timeout: patient name, date of birth, surgical site, and procedure verified   Procedure prep:  Patient was prepped and draped in usual sterile fashion Prep type:  Isopropyl alcohol Anesthesia: the lesion was anesthetized in a standard fashion   Anesthetic:  1% lidocaine w/ epinephrine 1-100,000 buffered w/ 8.4% NaHCO3 Instrument used: flexible razor blade   Hemostasis achieved with: aluminum chloride   Outcome: patient tolerated procedure well   Post-procedure details: wound care instructions given   Additional details:  Petrolatum and a pressure bandage applied  Specimen 1 - Surgical pathology Differential Diagnosis: R/o Atypia vs  SK  Check Margins: No 1.5 cm brown and pink thin plaque   Right helix  Skin / nail biopsy Type of biopsy: tangential   Informed consent: discussed and consent obtained   Timeout: patient name, date of birth, surgical site, and procedure verified   Procedure prep:  Patient was prepped and  draped in usual sterile fashion Prep type:  Isopropyl alcohol Anesthesia: the lesion was anesthetized in a standard fashion   Anesthetic:  1% lidocaine w/ epinephrine 1-100,000 buffered w/ 8.4% NaHCO3 Instrument used: flexible razor blade   Hemostasis achieved with: aluminum chloride   Outcome: patient tolerated procedure well   Post-procedure details: wound care instructions given   Additional details:  Petrolatum and a pressure bandage applied  Specimen 2 - Surgical pathology Differential Diagnosis: R/o BCC vs Scar  Check Margins: No 0.6 cm pink papule    History of Squamous Cell Carcinoma in Situ of the Skin - No evidence of recurrence today - Recommend regular full body skin exams - Recommend daily broad spectrum sunscreen SPF 30+ to sun-exposed areas, reapply every 2 hours as needed.  - Call if any new or changing lesions are noted between office visits  Lentigines - Scattered tan macules - Due to sun exposure - Benign-appearing, observe - Recommend daily broad spectrum sunscreen SPF 30+ to sun-exposed areas, reapply every 2 hours as needed. - Call for any changes  Seborrheic Keratoses - Stuck-on, waxy, tan-brown papules and/or plaques  - Benign-appearing - Discussed benign etiology and prognosis. - Observe - Call for any changes  Melanocytic Nevi - Tan-brown and/or pink-flesh-colored symmetric macules and papules - Benign appearing on exam today - Observation - Call clinic for new or changing moles - Recommend daily use of broad spectrum spf 30+ sunscreen to sun-exposed areas.   Hemangiomas - Red papules - Discussed benign nature - Observe - Call for any changes  Actinic Damage - Chronic condition, secondary to cumulative UV/sun exposure - diffuse scaly erythematous macules with underlying dyspigmentation - Recommend daily broad spectrum sunscreen SPF 30+ to sun-exposed areas, reapply every 2 hours as needed.  - Staying in the shade or wearing long  sleeves, sun glasses (UVA+UVB protection) and wide brim hats (4-inch brim around the entire circumference of the hat) are also recommended for sun protection.  - Call for new or changing lesions.  Skin cancer screening performed today.  Return for TBSE 6-12 months.  Graciella Belton, RMA, am acting as scribe for Forest Gleason, MD .  Documentation: I have reviewed the above documentation for accuracy and completeness, and I agree with the above.  Forest Gleason, MD

## 2022-06-27 ENCOUNTER — Encounter: Payer: Self-pay | Admitting: Dermatology

## 2022-06-28 ENCOUNTER — Ambulatory Visit (INDEPENDENT_AMBULATORY_CARE_PROVIDER_SITE_OTHER): Payer: Medicare HMO | Admitting: Dermatology

## 2022-06-28 ENCOUNTER — Ambulatory Visit (INDEPENDENT_AMBULATORY_CARE_PROVIDER_SITE_OTHER): Payer: Medicare HMO | Admitting: *Deleted

## 2022-06-28 DIAGNOSIS — D0339 Melanoma in situ of other parts of face: Secondary | ICD-10-CM | POA: Diagnosis not present

## 2022-06-28 DIAGNOSIS — Z0000 Encounter for general adult medical examination without abnormal findings: Secondary | ICD-10-CM

## 2022-06-28 DIAGNOSIS — D0330 Melanoma in situ of unspecified part of face: Secondary | ICD-10-CM

## 2022-06-28 DIAGNOSIS — D492 Neoplasm of unspecified behavior of bone, soft tissue, and skin: Secondary | ICD-10-CM

## 2022-06-28 MED ORDER — MUPIROCIN 2 % EX OINT: 1.0000 | TOPICAL_OINTMENT | Freq: Every day | CUTANEOUS | 0 refills | Status: AC

## 2022-06-28 NOTE — Progress Notes (Signed)
   Follow-Up Visit   Subjective  Brian [name] is a 88 y.o. male who presents for the following: Follow-up (Patient here today for biopsy.).   The following portions of the chart were reviewed this encounter and updated as appropriate:   Tobacco  Allergies  Meds  Problems  Med Hx  Surg Hx  Fam Hx      Review of Systems:  No other skin or systemic complaints except as noted in HPI or Assessment and Plan.  Objective  Well appearing patient in no apparent distress; mood and affect are within normal limits.  A focused examination was performed including face. Relevant physical exam findings are noted in the Assessment and Plan.  right preauricular 1.5 cm brown and pink thin plaque [phone]    Assessment & Plan  Neoplasm of skin right preauricular  Epidermal / dermal shaving  Lesion diameter (cm):  1.5 Informed consent: discussed and consent obtained   Timeout: patient name, date of birth, surgical site, and procedure verified   Anesthesia: the lesion was anesthetized in a standard fashion   Anesthetic:  1% lidocaine w/ epinephrine 1-100,000 local infiltration Instrument used: flexible razor blade   Hemostasis achieved with: aluminum chloride   Outcome: patient tolerated procedure well   Post-procedure details: wound care instructions given   Additional details:  Mupirocin and a bandage applied  mupirocin ointment (BACTROBAN) 2 % Apply 1 Application topically daily.  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 Recommend repeat broader shave to lesion to assess for invasion.  Discussed plan for referral for Mohs surgery provided no invasion is appreciated. Will also call daughter to review with her.   Return in about 3 months (around 09/28/2022) for TBSE.  Graciella Belton, RMA, am acting as scribe for Forest Gleason, MD .  Documentation: I have reviewed the above documentation for accuracy and completeness, and I agree with the above.  Forest Gleason, MD

## 2022-06-28 NOTE — Patient Instructions (Signed)
Brian [name] , Thank you for taking time to come for your Medicare Wellness Visit. I appreciate your ongoing commitment to your health goals. Please review the following plan we discussed and let me know if I can assist you in the future.   Screening recommendations/referrals: Colonoscopy: no longer required Recommended yearly ophthalmology/optometry visit for glaucoma screening and checkup Recommended yearly dental visit for hygiene and checkup  Vaccinations: Influenza vaccine: up to date Pneumococcal vaccine: up to date Tdap vaccine: up to date Shingles vaccine: Education provided    Advanced directives:   Conditions/risks identified:   Next appointment: 12-20-2021 @ 8:40  [facility] 65 Years and Older, Male Preventive care refers to lifestyle choices and visits with your health care provider that can promote health and wellness. What does preventive care include? A yearly physical exam. This is also called an annual well check. Dental exams once or twice a year. Routine eye exams. Ask your health care provider how often you should have your eyes checked. Personal lifestyle choices, including: Daily care of your teeth and gums. Regular physical activity. Eating a healthy diet. Avoiding tobacco and drug use. Limiting alcohol use. Practicing safe sex. Taking low doses of aspirin every day. Taking vitamin and mineral supplements as recommended by your health care provider. What happens during an annual well check? The services and screenings done by your health care provider during your annual well check will depend on your age, overall health, lifestyle risk factors, and family history of disease. Counseling  Your health care provider may ask you questions about your: Alcohol use. Tobacco use. Drug use. Emotional well-being. Home and relationship well-being. Sexual activity. Eating habits. History of falls. Memory and ability to understand (cognition). Work  and work Statistician. Screening  You may have the following tests or measurements: Height, weight, and BMI. Blood pressure. Lipid and cholesterol levels. These may be checked every 5 years, or more frequently if you are over 48 years old. Skin check. Lung cancer screening. You may have this screening every year starting at age 47 if you have a 30-pack-year history of smoking and currently smoke or have quit within the past 15 years. Fecal occult blood test (FOBT) of the stool. You may have this test every year starting at age 68. Flexible sigmoidoscopy or colonoscopy. You may have a sigmoidoscopy every 5 years or a colonoscopy every 10 years starting at age 47. Prostate cancer screening. Recommendations will vary depending on your family history and other risks. Hepatitis C blood test. Hepatitis B blood test. Sexually transmitted disease (STD) testing. Diabetes screening. This is done by checking your blood sugar (glucose) after you have not eaten for a while (fasting). You may have this done every 1-3 years. Abdominal aortic aneurysm (AAA) screening. You may need this if you are a current or former smoker. Osteoporosis. You may be screened starting at age 27 if you are at high risk. Talk with your health care provider about your test results, treatment options, and if necessary, the need for more tests. Vaccines  Your health care provider may recommend certain vaccines, such as: Influenza vaccine. This is recommended every year. Tetanus, diphtheria, and acellular pertussis (Tdap, Td) vaccine. You may need a Td booster every 10 years. Zoster vaccine. You may need this after age 39. Pneumococcal 13-valent conjugate (PCV13) vaccine. One dose is recommended after age 30. Pneumococcal polysaccharide (PPSV23) vaccine. One dose is recommended after age 19. Talk to your health care provider about which screenings and vaccines  you need and how often you need them. This information is not intended  to replace advice given to you by your health care provider. Make sure you discuss any questions you have with your health care provider. Document Released: 11/26/2015 Document Revised: 07/19/2016 Document Reviewed: 08/31/2015 Elsevier Interactive Patient Education  2017 [place] Prevention in the Home Falls can cause injuries. They can happen to people of all ages. There are many things you can do to make your home safe and to help prevent falls. What can I do on the outside of my home? Regularly fix the edges of walkways and driveways and fix any cracks. Remove anything that might make you trip as you walk through a door, such as a raised step or threshold. Trim any bushes or trees on the path to your home. Use bright outdoor lighting. Clear any walking paths of anything that might make someone trip, such as rocks or tools. Regularly check to see if handrails are loose or broken. Make sure that both sides of any steps have handrails. Any raised decks and porches should have guardrails on the edges. Have any leaves, snow, or ice cleared regularly. Use sand or salt on walking paths during winter. Clean up any spills in your garage right away. This includes oil or grease spills. What can I do in the bathroom? Use night lights. Install grab bars by the toilet and in the tub and shower. Do not use towel bars as grab bars. Use non-skid mats or decals in the tub or shower. If you need to sit down in the shower, use a plastic, non-slip stool. Keep the floor dry. Clean up any water that spills on the floor as soon as it happens. Remove soap buildup in the tub or shower regularly. Attach bath mats securely with double-sided non-slip rug tape. Do not have throw rugs and other things on the floor that can make you trip. What can I do in the bedroom? Use night lights. Make sure that you have a light by your bed that is easy to reach. Do not use any sheets or blankets that are too big  for your bed. They should not hang down onto the floor. Have a firm chair that has side arms. You can use this for support while you get dressed. Do not have throw rugs and other things on the floor that can make you trip. What can I do in the kitchen? Clean up any spills right away. Avoid walking on wet floors. Keep items that you use a lot in easy-to-reach places. If you need to reach something above you, use a strong step stool that has a grab bar. Keep electrical cords out of the way. Do not use floor polish or wax that makes floors slippery. If you must use wax, use non-skid floor wax. Do not have throw rugs and other things on the floor that can make you trip. What can I do with my stairs? Do not leave any items on the stairs. Make sure that there are handrails on both sides of the stairs and use them. Fix handrails that are broken or loose. Make sure that handrails are as long as the stairways. Check any carpeting to make sure that it is firmly attached to the stairs. Fix any carpet that is loose or worn. Avoid having throw rugs at the top or bottom of the stairs. If you do have throw rugs, attach them to the floor with carpet tape. Make sure  that you have a light switch at the top of the stairs and the bottom of the stairs. If you do not have them, ask someone to add them for you. What else can I do to help prevent falls? Wear shoes that: Do not have high heels. Have rubber bottoms. Are comfortable and fit you well. Are closed at the toe. Do not wear sandals. If you use a stepladder: Make sure that it is fully opened. Do not climb a closed stepladder. Make sure that both sides of the stepladder are locked into place. Ask someone to hold it for you, if possible. Clearly mark and make sure that you can see: Any grab bars or handrails. First and last steps. Where the edge of each step is. Use tools that help you move around (mobility aids) if they are needed. These  include: Canes. Walkers. Scooters. Crutches. Turn on the lights when you go into a dark area. Replace any light bulbs as soon as they burn out. Set up your furniture so you have a clear path. Avoid moving your furniture around. If any of your floors are uneven, fix them. If there are any pets around you, be aware of where they are. Review your medicines with your doctor. Some medicines can make you feel dizzy. This can increase your chance of falling. Ask your doctor what other things that you can do to help prevent falls. This information is not intended to replace advice given to you by your health care provider. Make sure you discuss any questions you have with your health care provider. Document Released: 08/26/2009 Document Revised: 04/06/2016 Document Reviewed: 12/04/2014 Elsevier Interactive Patient Education  2017 [employer].

## 2022-06-28 NOTE — Progress Notes (Signed)
Subjective:   Brian [name] is a 88 y.o. male who presents for Medicare Annual/Subsequent preventive examination.  I connected with  Brian [name] on 06/28/22 by a telephone enabled telemedicine application and verified that I am speaking with the correct person using two identifiers.   I discussed the limitations of evaluation and management by telemedicine. The patient expressed understanding and agreed to proceed.  Patient location: home  Provider location:   Tele-health-home    Review of Systems     Cardiac Risk Factors include: advanced age (>23mn, >>25women);male gender;family history of premature cardiovascular disease;hypertension;smoking/ tobacco exposure (chews on cigar)     Objective:    Today's Vitals   There is no height or weight on file to calculate BMI.     06/28/2022    9:05 AM 06/27/2021    9:01 AM 06/25/2020    9:02 AM 06/23/2019    9:34 AM 06/19/2018    9:51 AM 12/18/2016   10:30 AM 10/29/2016   12:38 AM  Advanced Directives  Does Patient Have a Medical Advance Directive? Yes Yes Yes Yes Yes Yes Yes  Type of Advance Directive Living will [place] Living will;Healthcare Power of Attorney Living will;Healthcare Power of Attorney Living will;Healthcare Power of [place]  Does patient want to make changes to medical advance directive?      No - Patient declined No - Patient declined  Copy of HFidelisin Chart?  No - copy requested No - copy requested No - copy requested No - copy requested No - copy requested No - copy requested    Current Medications (verified) Outpatient Encounter Medications as of 06/28/2022  Medication Sig   atorvastatin (LIPITOR) 80 MG tablet TAKE 1 TABLET BY MOUTH ONCE DAILY AT  6PM   clopidogrel (PLAVIX) 75 MG tablet Take 1 tablet (75 mg total) by mouth daily.   diazepam (VALIUM) 5 MG tablet Take 5 mg  by mouth every 8 (eight) hours as needed (for anxiety).    metoprolol succinate (TOPROL-XL) 25 MG 24 hr tablet Take 1 tablet (25 mg total) by mouth daily.   nitroGLYCERIN (NITROSTAT) 0.4 MG SL tablet PLACE ONE TABLET UNDER THE TONGUE EVERY 5 MINUTES AS NEEDED FOR CHEST PAIN   traMADol (ULTRAM) 50 MG tablet Take 1 tablet (50 mg total) by mouth every 4 (four) hours as needed.   No facility-administered encounter medications on file as of 06/28/2022.    Allergies (verified) Zoloft [sertraline hcl]   History: Past Medical History:  Diagnosis Date   AAA (abdominal aortic aneurysm) (HCC)    2.9 CM infrarenal AAA on CT 11/2011   Acute anterior wall MI ([facility] 05/29/2014   Alcoholism ([place]    Aneurysm of abdominal aorta (HCC)    SMALL   Anxiety    panic attacks, rare.   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    Gastritis 03/2012   GERD (gastroesophageal reflux disease)    GI bleed    HFrEF (heart failure with reduced ejection fraction) (HCC)    HOH (hard of hearing)    Hyperlipemia    Hypertension    Hypovolemic shock ([place]    Kidney stones  Myocardial infarction ([place]) 2015   Panic attacks    Positive H. pylori titer ?, before 2010   treated with Abx.    Squamous cell carcinoma of skin 12/28/2021   SCC IS, R medial calf superior, EDC 01/23/22   Squamous cell carcinoma of skin 12/28/2021   SCC IS, R medial calf inferior, EDC 01/23/22   Tubular adenoma of colon    Past Surgical History:  Procedure Laterality Date   CARDIAC CATHETERIZATION  2015   Stents   CATARACT EXTRACTION W/PHACO Right 09/21/2016   Procedure: CATARACT EXTRACTION PHACO AND INTRAOCULAR LENS PLACEMENT (IOC);  Surgeon: Eulogio Bear, MD;  Location: [facility] ORS;  Service: Ophthalmology;  Laterality: Right;  [place] 1.10 AP% 13.8 CDE 9.93 Fluid pack  lot # [id number] H   CATARACT EXTRACTION W/PHACO Left 12/07/2016   Procedure: CATARACT EXTRACTION PHACO AND INTRAOCULAR LENS PLACEMENT (IOC);  Surgeon: Eulogio Bear, MD;  Location: [facility] ORS;  Service: Ophthalmology;  Laterality: Left;  [place] 00:42.7 ap 13.7 cde 5.85 lot #[id number]   COLONOSCOPY WITH PROPOFOL Left 06/06/2015   Procedure: COLONOSCOPY WITH PROPOFOL;  Surgeon: Hulen Luster, MD;  Location: [facility] ENDOSCOPY;  Service: Endoscopy;  Laterality: Left;   CORONARY ANGIOPLASTY     2015   LEFT HEART CATHETERIZATION WITH CORONARY ANGIOGRAM Bilateral 05/29/2014   Procedure: LEFT HEART CATHETERIZATION WITH CORONARY ANGIOGRAM;  Surgeon: Laverda Page, MD;  Location[facility] CATH LAB;  Service: Cardiovascular;  Laterality: Bilateral;   PERCUTANEOUS CORONARY STENT INTERVENTION (PCI-S)  05/29/2014   Procedure: PERCUTANEOUS CORONARY STENT INTERVENTION (PCI-S);  Surgeon: Laverda Page, MD;  Location: [facility] CATH LAB;  Service: Cardiovascular;;  DES x2 Prox and Mid LAD    Family History  Problem Relation Age of Onset   Heart disease Mother    Ulcers Father        stomach    Heart disease Father    Anuerysm Brother    Leukemia Brother    Heart disease Paternal Grandmother    Heart attack Paternal Grandmother    Heart disease Paternal Grandfather    Hypertension Daughter    Colon cancer Neg Hx    Social History   Socioeconomic History   Marital status: Married    Spouse name: Not on file   Number of children: 4   Years of education: Not on file   Highest education level: High school graduate  Occupational History   Occupation: semi retired  Tobacco Use   Smoking status: Former    Packs/day: 0.25    Years: 55.00    Total pack years: 13.75    Types: Cigarettes, Cigars    Quit date: 06/2021    Years since quitting: 1.0   Smokeless tobacco: Never   Tobacco comments:    3-4 cigarettes daily   Vaping Use   Vaping Use: Never used  Substance and Sexual Activity   Alcohol use: No     Alcohol/week: 0.0 standard drinks of alcohol   Drug use: No   Sexual activity: Not Currently  Other Topics Concern   Not on file  Social History Narrative   Working 3.5 hours a day 6 days a week in [employer]    Social Determinants of Health   Financial Resource Strain: Low Risk  (06/28/2022)   Overall Financial Resource Strain (CARDIA)    Difficulty of Paying Living Expenses: Not hard at all  Food Insecurity: No Food Insecurity (06/28/2022)   Hunger Vital Sign    Worried About Charity fundraiser in  the Last Year: Never true    [place] in the Last Year: Never true  Transportation Needs: No Transportation Needs (06/28/2022)   PRAPARE - Hydrologist (Medical): No    Lack of Transportation (Non-Medical): No  Physical Activity: Insufficiently Active (06/27/2021)   Exercise Vital Sign    Days of Exercise per Week: 4 days    Minutes of Exercise per Session: 20 min  Stress: No Stress Concern Present (06/28/2022)   [place]    Feeling of Stress : Not at all  Social Connections: [place] (06/28/2022)   Social Connection and Isolation Panel [NHANES]    Frequency of Communication with Friends and Family: More than three times a week    Frequency of Social Gatherings with Friends and Family: Three times a week    Attends Religious Services: More than 4 times per year    Active Member of Clubs or Organizations: Yes    Attends Music therapist: More than 4 times per year    Marital Status: Married    Tobacco Counseling Counseling given: Not Answered Tobacco comments: 3-4 cigarettes daily    Clinical Intake:  Pre-visit preparation completed: Yes  Pain : No/denies pain     Diabetes: No  How often do you need to have someone help you when you read instructions, pamphlets, or other written materials from your doctor or pharmacy?: 1 - Never  Diabetic?   no  Interpreter Needed?: No  Information entered by :: Leroy Kennedy LPN   Activities of Daily Living    06/28/2022    9:11 AM 12/19/2021    8:55 AM  In your present state of health, do you have any difficulty performing the following activities:  Hearing? 1 1  Vision? 0 0  Difficulty concentrating or making decisions? 0 0  Walking or climbing stairs? 0 0  Dressing or bathing? 0 0  Doing errands, shopping? 0 0  Preparing Food and eating ? N   Using the Toilet? N   In the past six months, have you accidently leaked urine? Y   Do you have problems with loss of bowel control? N   Managing your Medications? N   Managing your Finances? N   Housekeeping or managing your Housekeeping? N     Patient Care Team: Valerie Roys, DO as PCP - General (Family Medicine) Pyrtle, Lajuan Lines, MD as Consulting Physician (Gastroenterology) Nathanial Rancher, MD as Referring Physician (Anesthesiology) Despina Hick, MD as Referring Physician (Cardiology)  Indicate any recent Medical Services you may have received from other than [facility] providers in the past year (date may be approximate).     Assessment:   This is a routine wellness examination for [place].  Hearing/Vision screen Hearing Screening - Comments:: Bilateral aids Vision Screening - Comments:: Not up to date [facility]  Dietary issues and exercise activities discussed: Current Exercise Habits: The patient does not participate in regular exercise at present   Goals Addressed             This Visit's Progress    Patient Stated       Keep working       Depression Screen    06/28/2022    9:10 AM 12/19/2021    8:46 AM 06/27/2021    9:02 AM 06/15/2021    9:27 AM 12/15/2020    9:00 AM 06/25/2020    9:04 AM 04/19/2020  9:31 AM  PHQ 2/9 Scores  PHQ - 2 Score 1 2 0 0 0 0 2  PHQ- 9 Score 1 6  0   4    Fall Risk    12/19/2021    8:47 AM 06/27/2021    9:02 AM 12/15/2020    9:00 AM 06/25/2020    9:04 AM 04/19/2020    9:31 AM   Fall Risk   Falls in the past year? 0 0 0 0 0  Number falls in past yr: 0  0  0  Injury with Fall? 0  0  0  Risk for fall due to : No Fall Risks Medication side effect No Fall Risks Medication side effect   Follow up Falls evaluation completed Falls evaluation completed;Education provided;Falls prevention discussed Falls evaluation completed Education provided;Falls prevention discussed;Falls evaluation completed     FALL RISK PREVENTION PERTAINING TO THE HOME:  Any stairs in or around the home? Yes  If so, are there any without handrails? No  Home free of loose throw rugs in walkways, pet beds, electrical cords, etc? Yes  Adequate lighting in your home to reduce risk of falls? Yes   ASSISTIVE DEVICES UTILIZED TO PREVENT FALLS:  Life alert? No  Use of a cane, walker or w/c? No  Grab bars in the bathroom? Yes  Shower chair or bench in shower? No  Elevated toilet seat or a handicapped toilet? No   TIMED UP AND GO:  Was the test performed? No .    Cognitive Function:        06/28/2022    9:07 AM 06/27/2021    9:05 AM 06/25/2020    9:07 AM 06/19/2018    9:53 AM 12/25/2017   11:17 AM  6CIT Screen  What Year? 0 points 0 points 0 points 0 points 0 points  What month? 0 points 0 points 0 points 0 points 0 points  What time? 0 points 0 points 0 points 0 points 0 points  Count back from 20 0 points 0 points 0 points 0 points 0 points  Months in reverse 0 points 0 points 0 points 0 points 0 points  Repeat phrase 2 points 0 points 2 points 4 points 0 points  Total Score 2 points 0 points 2 points 4 points 0 points    Immunizations Immunization History  Administered Date(s) Administered   Fluad Quad(high Dose 65+) 10/02/2019   Influenza, High Dose Seasonal PF 07/22/2018, 08/20/2020, 08/29/2021   Influenza-Unspecified 07/15/2015, 07/14/2017   PFIZER(Purple Top)SARS-COV-2 Vaccination 12/05/2019, 12/26/2019, 08/20/2020   Pneumococcal Conjugate-13 12/18/2016   Pneumococcal  Polysaccharide-23 12/25/2017   Td 12/15/2020    TDAP status: Up to date  Flu Vaccine status: Up to date  Pneumococcal vaccine status: Up to date  Covid-19 vaccine status: Information provided on how to obtain vaccines.   Qualifies for Shingles Vaccine? Yes   Zostavax completed No   Shingrix Completed?: No.    Education has been provided regarding the importance of this vaccine. Patient has been advised to call insurance company to determine out of pocket expense if they have not yet received this vaccine. Advised may also receive vaccine at local pharmacy or Health Dept. Verbalized acceptance and understanding.  Screening Tests Health Maintenance  Topic Date Due   INFLUENZA VACCINE  06/13/2022   COVID-19 Vaccine (4 - Pfizer risk series) 07/14/2022 (Originally 10/15/2020)   Zoster Vaccines- Shingrix (1 of 2) 09/28/2022 (Originally 05/27/1952)   TETANUS/TDAP  12/15/2030   Pneumonia Vaccine 65+ Years  old  Completed   HPV VACCINES  Aged Out    Health Maintenance  Health Maintenance Due  Topic Date Due   INFLUENZA VACCINE  06/13/2022    Colorectal cancer screening: No longer required.   Lung Cancer Screening: (Low Dose CT Chest recommended if Age 50-80 years, 30 pack-year currently smoking OR have quit w/in 15years.) does not qualify.   Lung Cancer Screening Referral:   Additional Screening:  Hepatitis C Screening: does not qualif  Vision Screening: Recommended annual ophthalmology exams for early detection of glaucoma and other disorders of the eye. Is the patient up to date with their annual eye exam?  No  Who is the provider or what is the name of the office in which the patient attends annual eye exams? [facility] If pt is not established with a provider, would they like to be referred to a provider to establish care? No .   Dental Screening: Recommended annual dental exams for proper oral hygiene  Community Resource Referral / Chronic Care Management: CRR required  this visit?  No   CCM required this visit?  No      Plan:     I have personally reviewed and noted the following in the patient's chart:   Medical and social history Use of alcohol, tobacco or illicit drugs  Current medications and supplements including opioid prescriptions. Patient is not currently taking opioid prescriptions. Functional ability and status Nutritional status Physical activity Advanced directives List of other physicians Hospitalizations, surgeries, and ER visits in previous 12 months Vitals Screenings to include cognitive, depression, and falls Referrals and appointments  In addition, I have reviewed and discussed with patient certain preventive protocols, quality metrics, and best practice recommendations. A written personalized care plan for preventive services as well as general preventive health recommendations were provided to patient.     Leroy Kennedy, LPN   12/07/5807   Nurse Notes:

## 2022-06-28 NOTE — Patient Instructions (Addendum)
Wound Care Instructions  Cleanse wound gently with soap and water once a day then pat dry with clean gauze. Apply a thin coat of Petrolatum (petroleum jelly, "Vaseline") over the wound (unless you have an allergy to this). We recommend that you use a new, sterile tube of Vaseline. Do not pick or remove scabs. Do not remove the yellow or white "healing tissue" from the base of the wound.  Cover the wound with fresh, clean, nonstick gauze and secure with paper tape. You may use Band-Aids in place of gauze and tape if the wound is small enough, but would recommend trimming much of the tape off as there is often too much. Sometimes Band-Aids can irritate the skin.  You should call the office for your biopsy report after 1 week if you have not already been contacted.  If you experience any problems, such as abnormal amounts of bleeding, swelling, significant bruising, significant pain, or evidence of infection, please call the office immediately.  FOR ADULT SURGERY PATIENTS: If you need something for pain relief you may take 1 extra strength Tylenol (acetaminophen) AND 2 Ibuprofen ('200mg'$  each) together every 4 hours as needed for pain. (do not take these if you are allergic to them or if you have a reason you should not take them.) Typically, you may only need pain medication for 1 to 3 days.    Just in case you need it - Dr. Laurence Ferrari Pager Number - [phone] - Call this number and then enter [place] phone number so I know who to call. If you have not heard from me within 1 hour, please page me again.   Due to recent changes in healthcare laws, you may see results of your pathology and/or laboratory studies on MyChart before the doctors have had a chance to review them. We understand that in some cases there may be results that are confusing or concerning to you. Please understand that not all results are received at the same time and often the doctors may need to interpret multiple results in order to  provide you with the best plan of care or course of treatment. Therefore, we ask that you please give [place] 2 business days to thoroughly review all your results before contacting the office for clarification. Should we see a critical lab result, you will be contacted sooner.   If You Need Anything After Your Visit  If you have any questions or concerns for your doctor, please call our main line at [phone] and press option 4 to reach your doctor's medical assistant. If no one answers, please leave a voicemail as directed and we will return your call as soon as possible. Messages left after 4 pm will be answered the following business day.   You may also send [place] a message via [place]. We typically respond to MyChart messages within 1-2 business days.  For prescription refills, please ask your pharmacy to contact our office. Our fax number is [phone].  If you have an urgent issue when the clinic is closed that cannot wait until the next business day, you can page your doctor at the number below.    Please note that while we do our best to be available for urgent issues outside of office hours, we are not available 24/7.   If you have an urgent issue and are unable to reach [place], you may choose to seek medical care at your doctor's office, retail clinic, urgent care center, or emergency room.  If you have  a medical emergency, please immediately call 911 or go to the emergency department.  Pager Numbers  - Dr. Nehemiah Massed: [phone]  - Dr. Laurence Ferrari: [phone]  - Dr. Nicole Kindred: [phone]  In the event of inclement weather, please call our main line at [phone] for an update on the status of any delays or closures.  Dermatology Medication Tips: Please keep the boxes that topical medications come in in order to help keep track of the instructions about where and how to use these. Pharmacies typically print the medication instructions only on the boxes and not directly on the  medication tubes.   If your medication is too expensive, please contact our office at [phone] option 4 or send [place] a message through [place].   We are unable to tell what your co-pay for medications will be in advance as this is different depending on your insurance coverage. However, we may be able to find a substitute medication at lower cost or fill out paperwork to get insurance to cover a needed medication.   If a prior authorization is required to get your medication covered by your insurance company, please allow [place] 1-2 business days to complete this process.  Drug prices often vary depending on where the prescription is filled and some pharmacies may offer cheaper prices.  The website www.goodrx.com contains coupons for medications through different pharmacies. The prices here do not account for what the cost may be with help from insurance (it may be cheaper with your insurance), but the website can give you the price if you did not use any insurance.  - You can print the associated coupon and take it with your prescription to the pharmacy.  - You may also stop by our office during regular business hours and pick up a GoodRx coupon card.  - If you need your prescription sent electronically to a different pharmacy, notify our office through [facility] or by phone at [phone] option 4.     Si Usted Necesita Algo Despus de Su Visita  Tambin puede enviarnos un mensaje a travs de Pharmacist, community. Por lo general respondemos a los mensajes de MyChart en el transcurso de 1 a 2 das hbiles.  Para renovar recetas, por favor pida a su farmacia que se ponga en contacto con nuestra oficina. Harland Dingwall de fax es [place] [phone].  Si tiene un asunto urgente cuando la clnica est cerrada y que no puede esperar hasta el siguiente da hbil, puede llamar/localizar a su doctor(a) al nmero que aparece a continuacin.   Por favor, tenga en cuenta que aunque hacemos todo lo posible  para estar disponibles para asuntos urgentes fuera del horario de [place], no estamos disponibles las 24 horas del da, los 7 das de la [place].   Si tiene un problema urgente y no puede comunicarse con nosotros, puede optar por buscar atencin mdica  en el consultorio de su doctor(a), en una clnica privada, en un centro de atencin urgente o en una sala de emergencias.  Si tiene Engineering geologist, por favor llame inmediatamente al 911 o vaya a la sala de emergencias.  Nmeros de bper  - Dr. Nehemiah Massed: [phone]  - Dra. Moye: [phone]  - Dra. Nicole Kindred: [phone]  En caso de inclemencias del [place], por favor llame a Johnsie Kindred principal al [phone] para una actualizacin sobre el [place] de cualquier retraso o cierre.  Consejos para la medicacin en dermatologa: Por favor, guarde las cajas en las que vienen los medicamentos de uso tpico para  ayudarle a seguir las [employer] dnde y cmo usarlos. Las farmacias generalmente imprimen las instrucciones del medicamento slo en las cajas y no directamente en los tubos del [place].   Si su medicamento es muy caro, por favor, pngase en contacto con Zigmund Daniel llamando al [phone] y presione la opcin 4 o envenos un mensaje a travs de Pharmacist, community.   No podemos decirle cul ser su copago por los medicamentos por adelantado ya que esto es diferente dependiendo de la cobertura de su seguro. Sin embargo, es posible que podamos encontrar un medicamento sustituto a Electrical engineer un formulario para que el seguro cubra el medicamento que se considera necesario.   Si se requiere una autorizacin previa para que su compaa de seguros [place] su medicamento, por favor permtanos de 1 a 2 das hbiles para completar este proceso.  Los precios de los medicamentos varan con frecuencia dependiendo del Environmental consultant de dnde se surte la receta y alguna farmacias pueden ofrecer precios ms baratos.  El sitio web  www.goodrx.com tiene cupones para medicamentos de Airline pilot. Los precios aqu no tienen en cuenta lo que podra costar con la ayuda del seguro (puede ser ms barato con su seguro), pero el sitio web puede darle el precio si no utiliz Research scientist (physical sciences).  - Puede imprimir el cupn correspondiente y llevarlo con su receta a la farmacia.  - Tambin puede pasar por nuestra oficina durante el horario de atencin regular y Charity fundraiser una tarjeta de cupones de GoodRx.  - Si necesita que su receta se enve electrnicamente a una farmacia diferente, informe a nuestra oficina a travs de MyChart de [place] o por telfono llamando al [phone] y presione la opcin 4.

## 2022-06-30 ENCOUNTER — Ambulatory Visit: Payer: Medicare HMO

## 2022-07-04 ENCOUNTER — Other Ambulatory Visit: Payer: Self-pay

## 2022-07-04 ENCOUNTER — Telehealth: Payer: Self-pay

## 2022-07-04 DIAGNOSIS — D0330 Melanoma in situ of unspecified part of face: Secondary | ICD-10-CM

## 2022-07-04 DIAGNOSIS — C44212 Basal cell carcinoma of skin of right ear and external auricular canal: Secondary | ICD-10-CM

## 2022-07-04 NOTE — Telephone Encounter (Signed)
-----   Message from Alfonso Patten, MD sent at 07/04/2022  1:31 PM EDT ----- Skin (M), right preauricular EXCISION, RESIDUAL MALIGNANT MELANOMA IN SITU, LATERAL MARGIN INVOLVED --> Urgent Mohs referral to Dr. Manley Mason at [facility]  Patient also with [facility] at the ear (biopsied 8/9) also needing Mohs.   PATHOLOGY FROM 8/9 ORIGINAL BIOPSIES 1. Skin , right preauricular MELANOMA IN SITU, PERIPHERAL MARGIN INVOLVED  2. Skin , right helix BASAL CELL CARCINOMA, NODULAR PATTERN   MAs please refer to Dr. Manley Mason at [facility] for both. Thank you!

## 2022-07-04 NOTE — Telephone Encounter (Signed)
Faxed referral to Dr. [place] office. Urgent referral for MIS and Mohs referral for [facility]. Called patient, advised referrals have been sent. Call our office if he has not heard from them within the week. JP

## 2022-07-08 ENCOUNTER — Encounter: Payer: Self-pay | Admitting: Dermatology

## 2022-07-14 DIAGNOSIS — Z79899 Other long term (current) drug therapy: Secondary | ICD-10-CM | POA: Diagnosis not present

## 2022-07-19 DIAGNOSIS — L814 Other melanin hyperpigmentation: Secondary | ICD-10-CM | POA: Diagnosis not present

## 2022-07-19 DIAGNOSIS — C44212 Basal cell carcinoma of skin of right ear and external auricular canal: Secondary | ICD-10-CM | POA: Diagnosis not present

## 2022-07-19 DIAGNOSIS — D0339 Melanoma in situ of other parts of face: Secondary | ICD-10-CM | POA: Diagnosis not present

## 2022-07-19 DIAGNOSIS — L578 Other skin changes due to chronic exposure to nonionizing radiation: Secondary | ICD-10-CM | POA: Diagnosis not present

## 2022-07-19 DIAGNOSIS — L988 Other specified disorders of the skin and subcutaneous tissue: Secondary | ICD-10-CM | POA: Diagnosis not present

## 2022-07-20 ENCOUNTER — Telehealth: Payer: Self-pay

## 2022-07-20 NOTE — Telephone Encounter (Signed)
Progress Notes from [facility] surgery at [facility]/Dr. Manley Mason scanned in under media tab for your review.

## 2022-07-20 NOTE — Telephone Encounter (Signed)
Reviewed. Thank you.

## 2022-08-14 ENCOUNTER — Telehealth: Payer: Self-pay

## 2022-08-14 NOTE — Telephone Encounter (Signed)
History and specimen tracking updated per MOHs progress note. 

## 2022-08-23 ENCOUNTER — Ambulatory Visit: Payer: Medicare HMO | Admitting: Cardiology

## 2022-09-04 DIAGNOSIS — R69 Illness, unspecified: Secondary | ICD-10-CM | POA: Diagnosis not present

## 2022-09-04 DIAGNOSIS — M25511 Pain in right shoulder: Secondary | ICD-10-CM | POA: Diagnosis not present

## 2022-09-04 DIAGNOSIS — M79606 Pain in leg, unspecified: Secondary | ICD-10-CM | POA: Diagnosis not present

## 2022-09-04 DIAGNOSIS — M545 Low back pain, unspecified: Secondary | ICD-10-CM | POA: Diagnosis not present

## 2022-09-04 DIAGNOSIS — F17210 Nicotine dependence, cigarettes, uncomplicated: Secondary | ICD-10-CM | POA: Diagnosis not present

## 2022-09-18 ENCOUNTER — Ambulatory Visit: Payer: Medicare HMO | Admitting: Cardiology

## 2022-09-18 ENCOUNTER — Encounter: Payer: Self-pay | Admitting: Cardiology

## 2022-09-18 VITALS — BP 147/75 | HR 79 | Resp 16 | Ht 67.0 in | Wt 127.0 lb

## 2022-09-18 DIAGNOSIS — I2510 Atherosclerotic heart disease of native coronary artery without angina pectoris: Secondary | ICD-10-CM | POA: Diagnosis not present

## 2022-09-18 DIAGNOSIS — F17210 Nicotine dependence, cigarettes, uncomplicated: Secondary | ICD-10-CM | POA: Diagnosis not present

## 2022-09-18 DIAGNOSIS — I119 Hypertensive heart disease without heart failure: Secondary | ICD-10-CM

## 2022-09-18 DIAGNOSIS — R69 Illness, unspecified: Secondary | ICD-10-CM | POA: Diagnosis not present

## 2022-09-18 MED ORDER — LOSARTAN POTASSIUM 25 MG PO TABS: 25.0000 mg | ORAL_TABLET | Freq: Every day | ORAL | 3 refills | Status: DC

## 2022-09-18 MED ORDER — ASPIRIN 81 MG PO TBEC: 81.0000 mg | DELAYED_RELEASE_TABLET | Freq: Every day | ORAL | 3 refills | Status: AC

## 2022-09-18 NOTE — Progress Notes (Signed)
 Follow up visit  Subjective:   Brian [name], male    DOB: 09/07/1933, 88 y.o.   MRN: [id number]   Chief Complaint  Patient presents with   Pararenal abdominal aortic aneurysm (AAA) without rupture   Coronary Artery Disease   Follow-up    1 year    HPI  88 y/o caucasian male with hypertension, hyperlipidemia, CAD h/o STEMI and ostial and mid LAD PCI 2015, AAA 2.9 cm (2013).  Patient is here with his daughter Lisa. He denies chest pain, shortness of breath, palpitations, leg edema, orthopnea, PND, TIA/syncope. Blood pressure usually runs 130-140/80-90. He smokes roughly 2 cigarettes/day.    Current Outpatient Medications:    atorvastatin (LIPITOR) 80 MG tablet, TAKE 1 TABLET BY MOUTH ONCE DAILY AT  6PM, Disp: 90 tablet, Rfl: 1   clopidogrel (PLAVIX) 75 MG tablet, Take 1 tablet (75 mg total) by mouth daily., Disp: 90 tablet, Rfl: 0   diazepam (VALIUM) 5 MG tablet, Take 5 mg by mouth every 8 (eight) hours as needed (for anxiety). , Disp: , Rfl:    metoprolol succinate (TOPROL-XL) 25 MG 24 hr tablet, Take 1 tablet (25 mg total) by mouth daily., Disp: 90 tablet, Rfl: 1   mupirocin ointment (BACTROBAN) 2 %, Apply 1 Application topically daily., Disp: 22 g, Rfl: 0   nitroGLYCERIN (NITROSTAT) 0.4 MG SL tablet, PLACE ONE TABLET UNDER THE TONGUE EVERY 5 MINUTES AS NEEDED FOR CHEST PAIN, Disp: 25 tablet, Rfl: 12   traMADol (ULTRAM) 50 MG tablet, Take 1 tablet (50 mg total) by mouth every 4 (four) hours as needed., Disp: 60 tablet, Rfl: 0   Cardiovascular studies:  EKG 09/18/2022: Sinus rhythm 66 bpm  Left axis deviation Old anteroseptal infarct Low voltage   Abdominal Aortic Duplex 09/06/2021: Moderate dilatation of the abdominal aorta is noted in the mid aorta. Dumbbell shaped proximal and mid abdominal aortic aneurysm.   An abdominal aortic aneurysm measuring 3.44 x 3.44 x 3.49 cm is seen. Mild plaque noted in the proximal, mid and distal aorta. No significant change from  08/16/2020. Recheck in 3 years for stability.  Echocardiogram 08/18/2021: Normal LV systolic function with EF 57%. Left ventricle cavity is normal in size. Normal left ventricular wall thickness. Normal global wall motion. Indeterminate diastolic filling pattern, insufficient data. Calculated EF 57%. Left atrial cavity is mildly dilated. Trileaflet aortic valve. No evidence of aortic stenosis. Mild (Grade I) aortic regurgitation. Mild aortic valve leaflet thickening. Borderline prolapse of the anterior mitral valve leaflet. No significant myxomatous degeneration present. Mild (Grade I) mitral regurgitation. Mild to moderate tricuspid regurgitation. No evidence of pulmonary hypertension. RVSP measures 30 mmHg. No significant change since 07/23/2018.  Coronary angiography and intervention 2015: LM: Normal LAD: Ostial 80% stenosis-->successful but suboptimal stenting 3.0X12 mm promus Premier      Dissufe and aneurysmal mid vessel disease-->2.5X28 mm Promus Premier      Distap apical LAD embolization LCx: Prox 30-40%, mid ectatic 30-40% stenosis.  RCA: Dominant. Mid 20-30% stenosis. Mild diffuse disease with ectasia in mid to distal segement. RPL 10-15% stenosis.  CT abdomen w/contrast 2013: 1. Diverticulosis of the visualized colon.  2. Borderline infrarenal abdominal aortic aneurysm 2.9 cm, slightly increased  from  prior.  3. Left-sided nephrolithiasis, without hydronephrosis.    Recent labs: 06/19/2022: Glucose 109, BUN/Cr 9/0.79. EGFR 86. Na/K 139/4.0. Rest of the CMP normal H/H 13/41. MCV 96. Platelets 146 HbA1C NA Chol 127, TG 72, HDL 67, LDL 45  11/2021: TSH 1.7 normal  Review   of Systems  Cardiovascular:  Negative for chest pain, dyspnea on exertion, leg swelling, palpitations and syncope.         Vitals:   09/18/22 1328  BP: (!) 147/75  Pulse: 79  Resp: 16  SpO2: 94%     Body mass index is 19.89 kg/m. Filed Weights   09/18/22 1328  Weight: 127 lb (57.6  kg)    Objective:   Physical Exam Vitals and nursing note reviewed.  Constitutional:      General: He is not in acute distress. Neck:     Vascular: No JVD.  Cardiovascular:     Rate and Rhythm: Normal rate and regular rhythm.     Heart sounds: Normal heart sounds. No murmur heard. Pulmonary:     Effort: Pulmonary effort is normal.     Breath sounds: Normal breath sounds. No wheezing or rales.         Assessment & Recommendations:   88 y/o caucasian male with hypertension, hyperlipidemia, CAD h/o STEMI and ostial and mid LAD PCI 2015, AAA 3.4 cm (2013).  CAD: Stable with no angina symptoms.  He has been on plavix without Aspirin with no bleeding issues. Continue the same, along with Lipitor 10, lisinopril 5 mg. Continue metoprolol succinate 25 mg,   AAA:  An abdominal aortic aneurysm measuring 3.44 x 3.44 x 3.49 cm is seen. Mild plaque noted in the proximal, mid and distal aorta. No significant change from 08/16/2020. Recheck in 3 years for stability.  Hypertension: Uncontrolled. In light of CAD and AAA, added losartan 25 mg daily. Check BMP in 1 week. Arranged for remote patient monitoring through pur pharmacist Alicia Sumeriski.  Tobacco cessation counseling: - Currently smoking 2 cigarettes/day   - Patient was informed of the dangers of tobacco abuse including stroke, cancer, and MI, as well as benefits of tobacco cessation. - Patient is unsure if he wants to quit at this time. - Approximately 5 mins were spent counseling patient cessation techniques. We discussed various methods to help quit smoking, including deciding on a date to quit, joining a support group, pharmacological agents. He is willing to quit at this time and will use nicotine gum.  - I will reassess his progress at the next follow-up visit  F/u in 1 year   Manish J Patwardhan, MD Pager: [phone] Office: [phone]     

## 2022-09-27 ENCOUNTER — Telehealth: Payer: Self-pay

## 2022-09-27 NOTE — Telephone Encounter (Signed)
Initial follow-up from starting RPM with home BP monitoring.   Per conversation this morning with Dr. Virgina Jock this morning - no changes to current regimen at this time. Will continue to monitor BP through RPM  Average Systolic BP Level 550.1 mmHg Lowest Systolic BP Level 586 mmHg Highest Systolic BP Level 825 mmHg  11/27/2021 Wednesday at 07:13 AM 148 / 94      09/26/2022 Tuesday at 07:00 AM 145 / 95      09/25/2022 Monday at 07:52 AM 141 / 96      09/24/2022 'Sunday at 07:55 AM 152 / 100      09/23/2022 Saturday at 01:43 PM 126 / 90      09/22/2022 Friday at 01:40 PM 123 / 81      11'$ /07/2022 Thursday at 02:00 PM 134 / 86

## 2022-10-02 DIAGNOSIS — I119 Hypertensive heart disease without heart failure: Secondary | ICD-10-CM | POA: Diagnosis not present

## 2022-10-03 LAB — BASIC METABOLIC PANEL
BUN/Creatinine Ratio: 18 (ref 10–24)
BUN: 14 mg/dL (ref 8–27)
CO2: 23 mmol/L (ref 20–29)
Calcium: 9.4 mg/dL (ref 8.6–10.2)
Chloride: 105 mmol/L (ref 96–106)
Creatinine, Ser: 0.79 mg/dL (ref 0.76–1.27)
Glucose: 104 mg/dL — ABNORMAL HIGH (ref 70–99)
Potassium: 4.2 mmol/L (ref 3.5–5.2)
Sodium: 140 mmol/L (ref 134–144)
eGFR: 86 mL/min/{1.73_m2} (ref >59)

## 2022-10-12 ENCOUNTER — Encounter: Payer: Self-pay | Admitting: Dermatology

## 2022-10-12 ENCOUNTER — Ambulatory Visit: Payer: Medicare HMO | Admitting: Dermatology

## 2022-10-12 VITALS — BP 122/76 | HR 81

## 2022-10-12 DIAGNOSIS — Z85828 Personal history of other malignant neoplasm of skin: Secondary | ICD-10-CM

## 2022-10-12 DIAGNOSIS — L570 Actinic keratosis: Secondary | ICD-10-CM

## 2022-10-12 DIAGNOSIS — Z86006 Personal history of melanoma in-situ: Secondary | ICD-10-CM | POA: Diagnosis not present

## 2022-10-12 DIAGNOSIS — Z1283 Encounter for screening for malignant neoplasm of skin: Secondary | ICD-10-CM | POA: Diagnosis not present

## 2022-10-12 DIAGNOSIS — L814 Other melanin hyperpigmentation: Secondary | ICD-10-CM | POA: Diagnosis not present

## 2022-10-12 DIAGNOSIS — L578 Other skin changes due to chronic exposure to nonionizing radiation: Secondary | ICD-10-CM

## 2022-10-12 DIAGNOSIS — L821 Other seborrheic keratosis: Secondary | ICD-10-CM

## 2022-10-12 DIAGNOSIS — D229 Melanocytic nevi, unspecified: Secondary | ICD-10-CM | POA: Diagnosis not present

## 2022-10-12 NOTE — Progress Notes (Signed)
Follow-Up Visit   Subjective  Brian [name] is a 88 y.o. male who presents for the following: Annual Exam (HxMIS, SCC, BCC and AKs).  The patient presents for Total-Body Skin Exam (TBSE) for skin cancer screening and mole check.  The patient has spots, moles and lesions to be evaluated, some may be new or changing and the patient has concerns that these could be cancer.   The following portions of the chart were reviewed this encounter and updated as appropriate:  Tobacco  Allergies  Meds  Problems  Med Hx  Surg Hx  Fam Hx      Review of Systems: No other skin or systemic complaints except as noted in HPI or Assessment and Plan.   Objective  Well appearing patient in no apparent distress; mood and affect are within normal limits.  A full examination was performed including scalp, head, eyes, ears, nose, lips, neck, chest, axillae, abdomen, back, buttocks, bilateral upper extremities, bilateral lower extremities, hands, feet, fingers, toes, fingernails, and toenails. All findings within normal limits unless otherwise noted below.  Mid Back x1 Erythematous thin papule with gritty scale.    Assessment & Plan   History of Melanoma in Situ. Right preauricular. Mohs 07/19/2022 - No evidence of recurrence today - No lymphadenopathy  - Recommend regular full body skin exams - Recommend daily broad spectrum sunscreen SPF 30+ to sun-exposed areas, reapply every 2 hours as needed.  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 EDC 01/23/2022. - No evidence of recurrence today - Recommend regular full body skin exams - Recommend daily broad spectrum sunscreen SPF 30+ to sun-exposed areas, reapply every 2 hours as needed.  - Call if any new or changing lesions are noted between office visits   History of Basal Cell Carcinoma of the Skin. Right helix. Mohs  07/19/2022. - No evidence of recurrence today - Recommend regular full body skin exams - Recommend daily broad spectrum sunscreen SPF 30+ to sun-exposed areas, reapply every 2 hours as needed.  - Call if any new or changing lesions are noted between office visits  Lentigines - Scattered tan macules - Due to sun exposure - Benign-appearing, observe - Recommend daily broad spectrum sunscreen SPF 30+ to sun-exposed areas, reapply every 2 hours as needed. - Call for any changes  Seborrheic Keratoses - Stuck-on, waxy, tan-brown papules and/or plaques  - Benign-appearing - Discussed benign etiology and prognosis. - Observe - Call for any changes  Melanocytic Nevi - Tan-brown and/or pink-flesh-colored symmetric macules and papules - Benign appearing on exam today - Observation - Call clinic for new or changing moles - Recommend daily use of broad spectrum spf 30+ sunscreen to sun-exposed areas.   Hemangiomas - Red papules - Discussed benign nature - Observe - Call for any changes  Actinic Damage - Chronic condition, secondary to cumulative UV/sun exposure - diffuse scaly erythematous macules with underlying dyspigmentation - Recommend daily broad spectrum sunscreen SPF 30+ to sun-exposed areas, reapply every 2 hours as needed.  - Staying in the shade or wearing long sleeves, sun glasses (UVA+UVB protection) and wide brim hats (4-inch brim around the entire circumference of the hat) are also recommended for sun protection.  - Call for new or changing lesions.  Skin cancer screening performed today.   AK (actinic keratosis) Mid Back x1  Actinic keratoses are precancerous spots that appear  secondary to cumulative UV radiation exposure/sun exposure over time. They are chronic with expected duration over 1 year. A portion of actinic keratoses will progress to squamous cell carcinoma of the skin. It is not possible to reliably predict which spots will progress to skin cancer and so  treatment is recommended to prevent development of skin cancer.  Recommend daily broad spectrum sunscreen SPF 30+ to sun-exposed areas, reapply every 2 hours as needed.  Recommend staying in the shade or wearing long sleeves, sun glasses (UVA+UVB protection) and wide brim hats (4-inch brim around the entire circumference of the hat). Call for new or changing lesions.  Destruction of lesion - Mid Back x1  Destruction method: cryotherapy   Informed consent: discussed and consent obtained   Lesion destroyed using liquid nitrogen: Yes   Region frozen until ice ball extended beyond lesion: Yes   Outcome: patient tolerated procedure well with no complications   Post-procedure details: wound care instructions given   Additional details:  Prior to procedure, discussed risks of blister formation, small wound, skin dyspigmentation, or rare scar following cryotherapy. Recommend Vaseline ointment to treated areas while healing.    Return in about 3 months (around 01/11/2023) for TBSE, HxMIS, BCC, SCC.  I, Emelia Salisbury, CMA, am acting as scribe for Forest Gleason, MD.  Documentation: I have reviewed the above documentation for accuracy and completeness, and I agree with the above.  Forest Gleason, MD

## 2022-10-12 NOTE — Patient Instructions (Addendum)
Cryotherapy Aftercare  Wash gently with soap and water everyday.   Apply Vaseline daily until healed.    Recommend daily broad spectrum sunscreen SPF 30+ to sun-exposed areas, reapply every 2 hours as needed. Call for new or changing lesions.  Staying in the shade or wearing long sleeves, sun glasses (UVA+UVB protection) and wide brim hats (4-inch brim around the entire circumference of the hat) are also recommended for sun protection.    Melanoma ABCDEs  Melanoma is the most dangerous type of skin cancer, and is the leading cause of death from skin disease.  You are more likely to develop melanoma if you: Have light-colored skin, light-colored eyes, or red or blond hair Spend a lot of time in the sun Tan regularly, either outdoors or in a tanning bed Have had blistering sunburns, especially during childhood Have a close family member who has had a melanoma Have atypical moles or large birthmarks  Early detection of melanoma is key since treatment is typically straightforward and cure rates are extremely high if we catch it early.   The first sign of melanoma is often a change in a mole or a new dark spot.  The ABCDE system is a way of remembering the signs of melanoma.  A for asymmetry:  The two halves do not match. B for border:  The edges of the growth are irregular. C for color:  A mixture of colors are present instead of an even brown color. D for diameter:  Melanomas are usually (but not always) greater than 6mm - the size of a pencil eraser. E for evolution:  The spot keeps changing in size, shape, and color.  Please check your skin once per month between visits. You can use a small mirror in front and a large mirror behind you to keep an eye on the back side or your body.   If you see any new or changing lesions before your next follow-up, please call to schedule a visit.  Please continue daily skin protection including broad spectrum sunscreen SPF 30+ to sun-exposed areas,  reapplying every 2 hours as needed when you're outdoors.   Staying in the shade or wearing long sleeves, sun glasses (UVA+UVB protection) and wide brim hats (4-inch brim around the entire circumference of the hat) are also recommended for sun protection.       Due to recent changes in healthcare laws, you may see results of your pathology and/or laboratory studies on MyChart before the doctors have had a chance to review them. We understand that in some cases there may be results that are confusing or concerning to you. Please understand that not all results are received at the same time and often the doctors may need to interpret multiple results in order to provide you with the best plan of care or course of treatment. Therefore, we ask that you please give us 2 business days to thoroughly review all your results before contacting the office for clarification. Should we see a critical lab result, you will be contacted sooner.   If You Need Anything After Your Visit  If you have any questions or concerns for your doctor, please call our main line at [phone] and press option 4 to reach your doctor's medical assistant. If no one answers, please leave a voicemail as directed and we will return your call as soon as possible. Messages left after 4 pm will be answered the following business day.   You may also send us a message   via MyChart. We typically respond to MyChart messages within 1-2 business days.  For prescription refills, please ask your pharmacy to contact our office. Our fax number is [phone].  If you have an urgent issue when the clinic is closed that cannot wait until the next business day, you can page your doctor at the number below.    Please note that while we do our best to be available for urgent issues outside of office hours, we are not available 24/7.   If you have an urgent issue and are unable to reach us, you may choose to seek medical care at your doctor's  office, retail clinic, urgent care center, or emergency room.  If you have a medical emergency, please immediately call 911 or go to the emergency department.  Pager Numbers  - Dr. Kowalski: [phone]  - Dr. Moye: [phone]  - Dr. Stewart: [phone]  In the event of inclement weather, please call our main line at [phone] for an update on the status of any delays or closures.  Dermatology Medication Tips: Please keep the boxes that topical medications come in in order to help keep track of the instructions about where and how to use these. Pharmacies typically print the medication instructions only on the boxes and not directly on the medication tubes.   If your medication is too expensive, please contact our office at [phone] option 4 or send us a message through MyChart.   We are unable to tell what your co-pay for medications will be in advance as this is different depending on your insurance coverage. However, we may be able to find a substitute medication at lower cost or fill out paperwork to get insurance to cover a needed medication.   If a prior authorization is required to get your medication covered by your insurance company, please allow us 1-2 business days to complete this process.  Drug prices often vary depending on where the prescription is filled and some pharmacies may offer cheaper prices.  The website www.goodrx.com contains coupons for medications through different pharmacies. The prices here do not account for what the cost may be with help from insurance (it may be cheaper with your insurance), but the website can give you the price if you did not use any insurance.  - You can print the associated coupon and take it with your prescription to the pharmacy.  - You may also stop by our office during regular business hours and pick up a GoodRx coupon card.  - If you need your prescription sent electronically to a different pharmacy, notify our office  through [place] MyChart or by phone at [phone] option 4.     Si Usted Necesita Algo Despus de Su Visita  Tambin puede enviarnos un mensaje a travs de MyChart. Por lo general respondemos a los mensajes de MyChart en el transcurso de 1 a 2 das hbiles.  Para renovar recetas, por favor pida a su farmacia que se ponga en contacto con nuestra oficina. Nuestro nmero de fax es el [phone].  Si tiene un asunto urgente cuando la clnica est cerrada y que no puede esperar hasta el siguiente da hbil, puede llamar/localizar a su doctor(a) al nmero que aparece a continuacin.   Por favor, tenga en cuenta que aunque hacemos todo lo posible para estar disponibles para asuntos urgentes fuera del horario de oficina, no estamos disponibles las 24 horas del da, los 7 das de la semana.   Si tiene un problema urgente y   no puede comunicarse con nosotros, puede optar por buscar atencin mdica  en el consultorio de su doctor(a), en una clnica privada, en un centro de atencin urgente o en una sala de emergencias.  Si tiene una emergencia mdica, por favor llame inmediatamente al 911 o vaya a la sala de emergencias.  Nmeros de bper  - Dr. Kowalski: [phone]  - Dra. Moye: [phone]  - Dra. Stewart: [phone]  En caso de inclemencias del tiempo, por favor llame a nuestra lnea principal al [phone] para una actualizacin sobre el estado de cualquier retraso o cierre.  Consejos para la medicacin en dermatologa: Por favor, guarde las cajas en las que vienen los medicamentos de uso tpico para ayudarle a seguir las instrucciones sobre dnde y cmo usarlos. Las farmacias generalmente imprimen las instrucciones del medicamento slo en las cajas y no directamente en los tubos del medicamento.   Si su medicamento es muy caro, por favor, pngase en contacto con nuestra oficina llamando al [phone] y presione la opcin 4 o envenos un mensaje a travs de MyChart.   No  podemos decirle cul ser su copago por los medicamentos por adelantado ya que esto es diferente dependiendo de la cobertura de su seguro. Sin embargo, es posible que podamos encontrar un medicamento sustituto a menor costo o llenar un formulario para que el seguro cubra el medicamento que se considera necesario.   Si se requiere una autorizacin previa para que su compaa de seguros cubra su medicamento, por favor permtanos de 1 a 2 das hbiles para completar este proceso.  Los precios de los medicamentos varan con frecuencia dependiendo del lugar de dnde se surte la receta y alguna farmacias pueden ofrecer precios ms baratos.  El sitio web www.goodrx.com tiene cupones para medicamentos de diferentes farmacias. Los precios aqu no tienen en cuenta lo que podra costar con la ayuda del seguro (puede ser ms barato con su seguro), pero el sitio web puede darle el precio si no utiliz ningn seguro.  - Puede imprimir el cupn correspondiente y llevarlo con su receta a la farmacia.  - Tambin puede pasar por nuestra oficina durante el horario de atencin regular y recoger una tarjeta de cupones de GoodRx.  - Si necesita que su receta se enve electrnicamente a una farmacia diferente, informe a nuestra oficina a travs de MyChart de [place] o por telfono llamando al [phone] y presione la opcin 4.  

## 2022-10-18 DIAGNOSIS — I119 Hypertensive heart disease without heart failure: Secondary | ICD-10-CM | POA: Diagnosis not present

## 2022-10-26 ENCOUNTER — Other Ambulatory Visit: Payer: Self-pay | Admitting: Family Medicine

## 2022-10-26 NOTE — Telephone Encounter (Signed)
Requested by interface surescripts. Medication discontinued 09/18/22 change in therapy.  Requested Prescriptions  Refused Prescriptions Disp Refills   clopidogrel (PLAVIX) 75 MG tablet [Pharmacy Med Name: Clopidogrel Bisulfate 75 MG Oral Tablet] 90 tablet 0    Sig: Take 1 tablet by mouth once daily     Hematology: Antiplatelets - clopidogrel Failed - 10/26/2022  9:31 AM      Failed - PLT in normal range and within 180 days    Platelets  Date Value Ref Range Status  06/19/2022 146 (L) 150 - 450 x10E3/uL Final         Passed - HCT in normal range and within 180 days    Hematocrit  Date Value Ref Range Status  06/19/2022 41.0 37.5 - 51.0 % Final         Passed - HGB in normal range and within 180 days    Hemoglobin  Date Value Ref Range Status  06/19/2022 13.7 13.0 - 17.7 g/dL Final         Passed - Cr in normal range and within 360 days    Creatinine  Date Value Ref Range Status  04/15/2012 0.80 0.60 - 1.30 mg/dL Final   Creatinine, Ser  Date Value Ref Range Status  10/02/2022 0.79 0.76 - 1.27 mg/dL Final         Passed - Valid encounter within last 6 months    Recent Outpatient Visits           4 months ago Hypertension with heart disease   [facility] Johnson, Megan P, DO   10 months ago Routine general medical examination at a health care facility   [facility], Catlin P, DO   11 months ago Diarrhea, unspecified type   [place], Megan P, DO   1 year ago Benign hypertensive renal disease   [facility] [place], Megan P, DO   1 year ago Routine general medical examination at a health care facility   [place], Barb Merino, DO       Future Appointments             In 1 month Johnson, Barb Merino, DO [place]   In 10 months Patwardhan, Reynold Bowen, MD [facility]

## 2022-12-20 ENCOUNTER — Encounter: Payer: Self-pay | Admitting: Family Medicine

## 2022-12-20 ENCOUNTER — Ambulatory Visit (INDEPENDENT_AMBULATORY_CARE_PROVIDER_SITE_OTHER): Payer: Medicare HMO | Admitting: Family Medicine

## 2022-12-20 VITALS — BP 108/70 | HR 82 | Temp 97.5°F | Ht 65.0 in | Wt 124.0 lb

## 2022-12-20 DIAGNOSIS — I5020 Unspecified systolic (congestive) heart failure: Secondary | ICD-10-CM | POA: Diagnosis not present

## 2022-12-20 DIAGNOSIS — I714 Abdominal aortic aneurysm, without rupture, unspecified: Secondary | ICD-10-CM

## 2022-12-20 DIAGNOSIS — I700 Atherosclerosis of aorta: Secondary | ICD-10-CM

## 2022-12-20 DIAGNOSIS — I2510 Atherosclerotic heart disease of native coronary artery without angina pectoris: Secondary | ICD-10-CM

## 2022-12-20 DIAGNOSIS — K219 Gastro-esophageal reflux disease without esophagitis: Secondary | ICD-10-CM | POA: Diagnosis not present

## 2022-12-20 DIAGNOSIS — Z0000 Encounter for general adult medical examination without abnormal findings: Secondary | ICD-10-CM

## 2022-12-20 DIAGNOSIS — I129 Hypertensive chronic kidney disease with stage 1 through stage 4 chronic kidney disease, or unspecified chronic kidney disease: Secondary | ICD-10-CM

## 2022-12-20 DIAGNOSIS — I119 Hypertensive heart disease without heart failure: Secondary | ICD-10-CM | POA: Diagnosis not present

## 2022-12-20 DIAGNOSIS — E440 Moderate protein-calorie malnutrition: Secondary | ICD-10-CM | POA: Diagnosis not present

## 2022-12-20 DIAGNOSIS — E782 Mixed hyperlipidemia: Secondary | ICD-10-CM | POA: Diagnosis not present

## 2022-12-20 MED ORDER — ATORVASTATIN CALCIUM 80 MG PO TABS: ORAL_TABLET | ORAL | 1 refills | Status: DC

## 2022-12-20 MED ORDER — METOPROLOL SUCCINATE ER 25 MG PO TB24: 25.0000 mg | ORAL_TABLET | Freq: Every day | ORAL | 1 refills | Status: DC

## 2022-12-20 MED ORDER — LOSARTAN POTASSIUM 25 MG PO TABS: 25.0000 mg | ORAL_TABLET | Freq: Every day | ORAL | 1 refills | Status: DC

## 2022-12-20 NOTE — Assessment & Plan Note (Signed)
Under good control on current regimen. Continue current regimen. Continue to monitor. Call with any concerns. Refills given. Labs drawn today.   

## 2022-12-20 NOTE — Assessment & Plan Note (Signed)
Will keep BP and cholesterol under good control. Continue to monitor. Call with any concerns.

## 2022-12-20 NOTE — Assessment & Plan Note (Signed)
Continue to follow with vascular. Call with any concerns.

## 2022-12-20 NOTE — Assessment & Plan Note (Signed)
Stable.  Continue to follow with cardiology. 

## 2022-12-20 NOTE — Assessment & Plan Note (Signed)
Weight stable. Continue to monitor. Call with any concerns.

## 2022-12-20 NOTE — Progress Notes (Signed)
BP 108/70   Pulse 82   Temp (!) 97.5 F (36.4 C) (Oral)   Ht '5\' 5"'$  (1.651 m)   Wt 124 lb (56.2 kg)   SpO2 95%   BMI 20.63 kg/m    Subjective:    Patient ID: Brian [name], male    DOB: 08/06/1933, 89 y.o.   MRN: [id number]  HPI: Brian [name] is a 89 y.o. male presenting on 12/20/2022 for comprehensive medical examination. Current medical complaints include:  HYPERTENSION / HYPERLIPIDEMIA Satisfied with current treatment? yes Duration of hypertension: chronic BP monitoring frequency: not checking BP medication side effects: no Past BP meds: losartan, metoprolol Duration of hyperlipidemia: chronic Cholesterol medication side effects: no Cholesterol supplements: none Past cholesterol medications: atorvastatin Medication compliance: excellent compliance Aspirin: yes Recent stressors: no Recurrent headaches: no Visual changes: no Palpitations: no Dyspnea: no Chest pain: no Lower extremity edema: no Dizzy/lightheaded: no  Interim Problems from his last visit: no  Depression Screen done today and results listed below:     12/20/2022    8:54 AM 06/28/2022    9:10 AM 12/19/2021    8:46 AM 06/27/2021    9:02 AM 06/15/2021    9:27 AM  Depression screen PHQ 2/9  Decreased Interest 0 1 1 0 0  Down, Depressed, Hopeless 0 0 1 0 0  PHQ - 2 Score 0 1 2 0 0  Altered sleeping 0 0 0  0  Tired, decreased energy 0 0 2  0  Change in appetite 1 0 2  0  Feeling bad or failure about yourself  1 0 0  0  Trouble concentrating 0 0 0  0  Moving slowly or fidgety/restless 0 0 0  0  Suicidal thoughts 0 0 0  0  PHQ-9 Score '2 1 6  '$ 0  Difficult doing work/chores Not difficult at all    Not difficult at all    Past Medical History:  Past Medical History:  Diagnosis Date   AAA (abdominal aortic aneurysm) (HCC)    2.9 CM infrarenal AAA on CT 11/2011   Acute anterior wall MI ([place]) 05/29/2014   Alcoholism ([place])    Aneurysm of abdominal aorta (HCC)    SMALL   Anxiety    panic attacks, rare.    Aortic aneurysm (HCC)    small   Arthritis    Basal cell carcinoma 06/21/2022   Right helix. 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    Gastritis 03/2012   GERD (gastroesophageal reflux disease)    GI bleed    HFrEF (heart failure with reduced ejection fraction) ([place])    HOH (hard of hearing)    Hyperlipemia    Hypertension    Hypovolemic shock ([place])    Kidney stones    Melanoma in situ ([place]) 06/21/2022   Right preauricular, Mohs 07/19/22   Myocardial infarction ([place]) 2015   Panic attacks    Positive H. pylori titer ?, before 2010   treated with Abx.    Squamous cell carcinoma of skin 12/28/2021   SCC IS, R medial calf superior, EDC 01/23/22   Squamous cell carcinoma of skin 12/28/2021   SCC IS, R medial calf inferior, EDC 01/23/22   Tubular adenoma of colon     Surgical History:  Past Surgical History:  Procedure Laterality Date  CARDIAC CATHETERIZATION  2015   Stents   CATARACT EXTRACTION W/PHACO Right 09/21/2016   Procedure: CATARACT EXTRACTION PHACO AND INTRAOCULAR LENS PLACEMENT (IOC);  Surgeon: Eulogio Bear, MD;  Location: [facility] ORS;  Service: Ophthalmology;  Laterality: Right;  [place] 1.10 AP% 13.8 CDE 9.93 Fluid pack lot # [id number]   CATARACT EXTRACTION W/PHACO Left 12/07/2016   Procedure: CATARACT EXTRACTION PHACO AND INTRAOCULAR LENS PLACEMENT (IOC);  Surgeon: Eulogio Bear, MD;  Location: [facility] ORS;  Service: Ophthalmology;  Laterality: Left;  [place] 00:42.7 ap 13.7 cde 5.85 lot #[id number]   COLONOSCOPY WITH PROPOFOL Left 06/06/2015   Procedure: COLONOSCOPY WITH PROPOFOL;  Surgeon: Hulen Luster, MD;  Location: [facility] ENDOSCOPY;  Service: Endoscopy;  Laterality: Left;   CORONARY ANGIOPLASTY     2015   LEFT HEART CATHETERIZATION WITH CORONARY ANGIOGRAM Bilateral 05/29/2014   Procedure: LEFT HEART CATHETERIZATION WITH CORONARY  ANGIOGRAM;  Surgeon: Laverda Page, MD;  Location: [facility] CATH LAB;  Service: Cardiovascular;  Laterality: Bilateral;   PERCUTANEOUS CORONARY STENT INTERVENTION (PCI-S)  05/29/2014   Procedure: PERCUTANEOUS CORONARY STENT INTERVENTION (PCI-S);  Surgeon: Laverda Page, MD;  Location: [facility] CATH LAB;  Service: Cardiovascular;;  DES x2 Prox and Mid LAD     Medications:  Current Outpatient Medications on File Prior to Visit  Medication Sig   aspirin EC 81 MG tablet Take 1 tablet (81 mg total) by mouth daily. Swallow whole.   diazepam (VALIUM) 5 MG tablet Take 5 mg by mouth every 8 (eight) hours as needed (for anxiety).    mupirocin ointment (BACTROBAN) 2 % Apply 1 Application topically daily.   nitroGLYCERIN (NITROSTAT) 0.4 MG SL tablet PLACE ONE TABLET UNDER THE TONGUE EVERY 5 MINUTES AS NEEDED FOR CHEST PAIN   traMADol (ULTRAM) 50 MG tablet Take 1 tablet (50 mg total) by mouth every 4 (four) hours as needed.   No current facility-administered medications on file prior to visit.    Allergies:  Allergies  Allergen Reactions   Zoloft [Sertraline Hcl] Nausea Only    Patient states that he was not himself when he took this drug    Social History:  Social History   Socioeconomic History   Marital status: Married    Spouse name: Not on file   Number of children: 4   Years of education: Not on file   Highest education level: High school graduate  Occupational History   Occupation: semi retired  Tobacco Use   Smoking status: Former    Packs/day: 0.25    Years: 55.00    Total pack years: 13.75    Types: Cigarettes, Cigars    Quit date: 06/2021    Years since quitting: 1.5   Smokeless tobacco: Never   Tobacco comments:    3-4 cigarettes daily   Vaping Use   Vaping Use: Never used  Substance and Sexual Activity   Alcohol use: No    Alcohol/week: 0.0 standard drinks of alcohol   Drug use: No   Sexual activity: Not Currently  Other Topics Concern   Not on file  Social History  Narrative   Working 3.5 hours a day 6 days a week in [employer]    Social Determinants of Health   Financial Resource Strain: Low Risk  (06/28/2022)   Overall Financial Resource Strain (CARDIA)    Difficulty of Paying Living Expenses: Not hard at all  Food Insecurity: No Food Insecurity (06/28/2022)   Hunger Vital Sign    Worried About Charity fundraiser in  the Last Year: Never true    [place] in the Last Year: Never true  Transportation Needs: No Transportation Needs (06/28/2022)   PRAPARE - Hydrologist (Medical): No    Lack of Transportation (Non-Medical): No  Physical Activity: Insufficiently Active (06/27/2021)   Exercise Vital Sign    Days of Exercise per Week: 4 days    Minutes of Exercise per Session: 20 min  Stress: No Stress Concern Present (06/28/2022)   [place]    Feeling of Stress : Not at all  Social Connections: [place] (06/28/2022)   Social Connection and Isolation Panel [NHANES]    Frequency of Communication with Friends and Family: More than three times a week    Frequency of Social Gatherings with Friends and Family: Three times a week    Attends Religious Services: More than 4 times per year    Active Member of Clubs or Organizations: Yes    Attends Archivist Meetings: More than 4 times per year    Marital Status: Married  Human resources officer Violence: Not At Risk (06/28/2022)   Humiliation, Afraid, Rape, and Kick questionnaire    Fear of Current or Ex-Partner: No    Emotionally Abused: No    Physically Abused: No    Sexually Abused: No   Social History   Tobacco Use  Smoking Status Former   Packs/day: 0.25   Years: 55.00   Total pack years: 13.75   Types: Cigarettes, Cigars   Quit date: 06/2021   Years since quitting: 1.5  Smokeless Tobacco Never  Tobacco Comments   3-4 cigarettes daily    Social History   Substance and Sexual  Activity  Alcohol Use No   Alcohol/week: 0.0 standard drinks of alcohol    Family History:  Family History  Problem Relation Age of Onset   Heart disease Mother    Ulcers Father        stomach    Heart disease Father    Anuerysm Brother    Leukemia Brother    Heart disease Paternal Grandmother    Heart attack Paternal Grandmother    Heart disease Paternal Grandfather    Hypertension Daughter    Colon cancer Neg Hx     Past medical history, surgical history, medications, allergies, family history and social history reviewed with patient today and changes made to appropriate areas of the chart.   Review of Systems  Constitutional:  Positive for chills. Negative for diaphoresis, fever, malaise/fatigue and weight loss.  HENT:  Positive for hearing loss. Negative for congestion, ear discharge, ear pain, nosebleeds, sinus pain, sore throat and tinnitus.   Eyes: Negative.   Respiratory:  Positive for shortness of breath. Negative for cough, hemoptysis, sputum production, wheezing and stridor.   Cardiovascular: Negative.   Gastrointestinal:  Positive for diarrhea and nausea. Negative for abdominal pain, blood in stool, constipation, heartburn, melena and vomiting.  Genitourinary: Negative.   Musculoskeletal: Negative.   Skin: Negative.   Neurological:  Positive for dizziness. Negative for tingling, tremors, sensory change, speech change, focal weakness, seizures, loss of consciousness, weakness and headaches.  Endo/Heme/Allergies: Negative.   Psychiatric/Behavioral: Negative.     All other ROS negative except what is listed above and in the HPI.      Objective:    BP 108/70   Pulse 82   Temp (!) 97.5 F (36.4 C) (Oral)   Ht '5\' 5"'$  (1.651 m)  Wt 124 lb (56.2 kg)   SpO2 95%   BMI 20.63 kg/m   Wt Readings from Last 3 Encounters:  12/20/22 124 lb (56.2 kg)  09/18/22 127 lb (57.6 kg)  06/19/22 128 lb 14.4 oz (58.5 kg)    Physical Exam Vitals and nursing note reviewed.   Constitutional:      General: He is not in acute distress.    Appearance: Normal appearance. He is normal weight. He is not ill-appearing, toxic-appearing or diaphoretic.  HENT:     Head: Normocephalic and atraumatic.     Right Ear: Tympanic membrane, ear canal and external ear normal. There is no impacted cerumen.     Left Ear: Tympanic membrane, ear canal and external ear normal. There is no impacted cerumen.     Nose: Nose normal. No congestion or rhinorrhea.     Mouth/Throat:     Mouth: Mucous membranes are moist.     Pharynx: Oropharynx is clear. No oropharyngeal exudate or posterior oropharyngeal erythema.  Eyes:     General: No scleral icterus.       Right eye: No discharge.        Left eye: No discharge.     Extraocular Movements: Extraocular movements intact.     Conjunctiva/sclera: Conjunctivae normal.     Pupils: Pupils are equal, round, and reactive to light.  Neck:     Vascular: No carotid bruit.  Cardiovascular:     Rate and Rhythm: Normal rate and regular rhythm.     Pulses: Normal pulses.     Heart sounds: No murmur heard.    No friction rub. No gallop.  Pulmonary:     Effort: Pulmonary effort is normal. No respiratory distress.     Breath sounds: Normal breath sounds. No stridor. No wheezing, rhonchi or rales.  Chest:     Chest wall: No tenderness.  Abdominal:     General: Abdomen is flat. Bowel sounds are normal. There is no distension.     Palpations: Abdomen is soft. There is no mass.     Tenderness: There is no abdominal tenderness. There is no right CVA tenderness, left CVA tenderness, guarding or rebound.     Hernia: No hernia is present.  Genitourinary:    Comments: Genital exam deferred with shared decision making Musculoskeletal:        General: No swelling, tenderness, deformity or signs of injury.     Cervical back: Normal range of motion and neck supple. No rigidity. No muscular tenderness.     Right lower leg: No edema.     Left lower leg: No  edema.  Lymphadenopathy:     Cervical: No cervical adenopathy.  Skin:    General: Skin is warm and dry.     Capillary Refill: Capillary refill takes less than 2 seconds.     Coloration: Skin is not jaundiced or pale.     Findings: No bruising, erythema, lesion or rash.  Neurological:     General: No focal deficit present.     Mental Status: He is alert and oriented to person, place, and time.     Cranial Nerves: No cranial nerve deficit.     Sensory: No sensory deficit.     Motor: No weakness.     Coordination: Coordination normal.     Gait: Gait normal.     Deep Tendon Reflexes: Reflexes normal.  Psychiatric:        Mood and Affect: Mood normal.  Behavior: Behavior normal.        Thought Content: Thought content normal.        Judgment: Judgment normal.     Results for orders placed or performed in visit on 62/56/38  Basic metabolic panel  Result Value Ref Range   Glucose 104 (H) 70 - 99 mg/dL   BUN 14 8 - 27 mg/dL   Creatinine, Ser 0.79 0.76 - 1.27 mg/dL   eGFR 86 >59 mL/min/1.73   BUN/Creatinine Ratio 18 10 - 24   Sodium 140 134 - 144 mmol/L   Potassium 4.2 3.5 - 5.2 mmol/L   Chloride 105 96 - 106 mmol/L   CO2 23 20 - 29 mmol/L   Calcium 9.4 8.6 - 10.2 mg/dL      Assessment & Plan:   Problem List Items Addressed This Visit       Cardiovascular and Mediastinum   AAA (abdominal aortic aneurysm) without rupture ([place])    Continue to follow with vascular. Call with any concerns.       Relevant Medications   atorvastatin (LIPITOR) 80 MG tablet   losartan (COZAAR) 25 MG tablet   metoprolol succinate (TOPROL-XL) 25 MG 24 hr tablet   CAD (coronary artery disease)    Will keep BP and cholesterol under good control. Continue to monitor. Call with any concerns.       Relevant Medications   atorvastatin (LIPITOR) 80 MG tablet   losartan (COZAAR) 25 MG tablet   metoprolol succinate (TOPROL-XL) 25 MG 24 hr tablet   HFrEF (heart failure with reduced ejection  fraction) (HCC)    Stable. Continue to follow with cardiology.       Relevant Medications   atorvastatin (LIPITOR) 80 MG tablet   losartan (COZAAR) 25 MG tablet   metoprolol succinate (TOPROL-XL) 25 MG 24 hr tablet   Aortic atherosclerosis (HCC)    Will keep BP and cholesterol under good control. Continue to monitor. Call with any concerns.       Relevant Medications   atorvastatin (LIPITOR) 80 MG tablet   losartan (COZAAR) 25 MG tablet   metoprolol succinate (TOPROL-XL) 25 MG 24 hr tablet   Hypertension with heart disease    Under good control on current regimen. Continue current regimen. Continue to monitor. Call with any concerns. Refills given. Labs drawn today.        Relevant Medications   atorvastatin (LIPITOR) 80 MG tablet   losartan (COZAAR) 25 MG tablet   metoprolol succinate (TOPROL-XL) 25 MG 24 hr tablet   Other Relevant Orders   Comprehensive metabolic panel   CBC with Differential/Platelet   Urinalysis, Routine w reflex microscopic     Digestive   GERD (gastroesophageal reflux disease)    Under good control on current regimen. Continue current regimen. Continue to monitor. Call with any concerns. Refills given. Labs drawn today.       Relevant Orders   Comprehensive metabolic panel   CBC with Differential/Platelet     Genitourinary   Benign hypertensive renal disease    Under good control on current regimen. Continue current regimen. Continue to monitor. Call with any concerns. Refills given. Labs drawn today.       Relevant Orders   Comprehensive metabolic panel   CBC with Differential/Platelet   TSH   Urinalysis, Routine w reflex microscopic   Urine Microalbumin w/creat. ratio     Other   Malnutrition of moderate degree (HCC)    Weight stable. Continue to monitor. Call with any concerns.  Mixed hyperlipidemia    Under good control on current regimen. Continue current regimen. Continue to monitor. Call with any concerns. Refills given. Labs  drawn today.       Relevant Medications   atorvastatin (LIPITOR) 80 MG tablet   losartan (COZAAR) 25 MG tablet   metoprolol succinate (TOPROL-XL) 25 MG 24 hr tablet   Other Relevant Orders   Comprehensive metabolic panel   CBC with Differential/Platelet   Lipid Panel w/o Chol/HDL Ratio   Other Visit Diagnoses     Routine general medical examination at a health care facility    -  Primary   Vaccines up to date. Screening labs checked today. Continue diet and exercise. Call with any concerns.        Discussed aspirin prophylaxis for myocardial infarction prevention and decision was it was not indicated  LABORATORY TESTING:  Health maintenance labs ordered today as discussed above.   IMMUNIZATIONS:   - Tdap: Tetanus vaccination status reviewed: last tetanus booster within 10 years. - Influenza: Up to date - Pneumovax: Up to date - Prevnar: Up to date - COVID: Up to date - HPV: Not applicable - Shingrix vaccine: Not applicable  SCREENING: - Colonoscopy: Not applicable  Discussed with patient purpose of the colonoscopy is to detect colon cancer at curable precancerous or early stages    PATIENT COUNSELING:    Sexuality: Discussed sexually transmitted diseases, partner selection, use of condoms, avoidance of unintended pregnancy  and contraceptive alternatives.   Advised to avoid cigarette smoking.  I discussed with the patient that most people either abstain from alcohol or drink within safe limits (<=14/week and <=4 drinks/occasion for males, <=7/weeks and <= 3 drinks/occasion for females) and that the risk for alcohol disorders and other health effects rises proportionally with the number of drinks per week and how often a drinker exceeds daily limits.  Discussed cessation/primary prevention of drug use and availability of treatment for abuse.   Diet: Encouraged to adjust caloric intake to maintain  or achieve ideal body weight, to reduce intake of dietary saturated fat  and total fat, to limit sodium intake by avoiding high sodium foods and not adding table salt, and to maintain adequate dietary potassium and calcium preferably from fresh fruits, vegetables, and low-fat dairy products.    stressed the importance of regular exercise  Injury prevention: Discussed safety belts, safety helmets, smoke detector, smoking near bedding or upholstery.   Dental health: Discussed importance of regular tooth brushing, flossing, and dental visits.   Follow up plan: NEXT PREVENTATIVE PHYSICAL DUE IN 1 YEAR. Return in about 6 months (around 06/20/2023).

## 2022-12-22 LAB — MICROALBUMIN / CREATININE URINE RATIO
Creatinine, Urine: 33 mg/dL
Microalb/Creat Ratio: 15 mg/g creat (ref 0–29)
Microalbumin, Urine: 4.9 ug/mL

## 2022-12-22 LAB — LIPID PANEL W/O CHOL/HDL RATIO
Cholesterol, Total: 149 mg/dL (ref 100–199)
HDL: 69 mg/dL (ref >39)
LDL Chol Calc (NIH): 64 mg/dL (ref 0–99)
Triglycerides: 83 mg/dL (ref 0–149)
VLDL Cholesterol Cal: 16 mg/dL (ref 5–40)

## 2022-12-22 LAB — CBC WITH DIFFERENTIAL/PLATELET
Basophils Absolute: 0 10*3/uL (ref 0.0–0.2)
Basos: 1 %
EOS (ABSOLUTE): 0.2 10*3/uL (ref 0.0–0.4)
Eos: 4 %
Hematocrit: 41.1 % (ref 37.5–51.0)
Hemoglobin: 14.1 g/dL (ref 13.0–17.7)
Immature Grans (Abs): 0 10*3/uL (ref 0.0–0.1)
Immature Granulocytes: 0 %
Lymphocytes Absolute: 1.4 10*3/uL (ref 0.7–3.1)
Lymphs: 23 %
MCH: 32 pg (ref 26.6–33.0)
MCHC: 34.3 g/dL (ref 31.5–35.7)
MCV: 93 fL (ref 79–97)
Monocytes Absolute: 0.5 10*3/uL (ref 0.1–0.9)
Monocytes: 8 %
Neutrophils Absolute: 4 10*3/uL (ref 1.4–7.0)
Neutrophils: 64 %
Platelets: 184 10*3/uL (ref 150–450)
RBC: 4.41 x10E6/uL (ref 4.14–5.80)
RDW: 11.9 % (ref 11.6–15.4)
WBC: 6.1 10*3/uL (ref 3.4–10.8)

## 2022-12-22 LAB — COMPREHENSIVE METABOLIC PANEL
ALT: 12 IU/L (ref 0–44)
AST: 17 IU/L (ref 0–40)
Albumin/Globulin Ratio: 1.4 (ref 1.2–2.2)
Albumin: 4 g/dL (ref 3.7–4.7)
Alkaline Phosphatase: 93 IU/L (ref 44–121)
BUN/Creatinine Ratio: 10 (ref 10–24)
BUN: 8 mg/dL (ref 8–27)
Bilirubin Total: 0.5 mg/dL (ref 0.0–1.2)
CO2: 25 mmol/L (ref 20–29)
Calcium: 9.4 mg/dL (ref 8.6–10.2)
Chloride: 104 mmol/L (ref 96–106)
Creatinine, Ser: 0.77 mg/dL (ref 0.76–1.27)
Globulin, Total: 2.9 g/dL (ref 1.5–4.5)
Glucose: 118 mg/dL — ABNORMAL HIGH (ref 70–99)
Potassium: 4.5 mmol/L (ref 3.5–5.2)
Sodium: 143 mmol/L (ref 134–144)
Total Protein: 6.9 g/dL (ref 6.0–8.5)
eGFR: 87 mL/min/{1.73_m2} (ref >59)

## 2022-12-22 LAB — URINALYSIS, ROUTINE W REFLEX MICROSCOPIC
Bilirubin, UA: NEGATIVE
Glucose, UA: NEGATIVE
Ketones, UA: NEGATIVE
Leukocytes,UA: NEGATIVE
Nitrite, UA: NEGATIVE
Protein,UA: NEGATIVE
RBC, UA: NEGATIVE
Specific Gravity, UA: 1.007 (ref 1.005–1.030)
Urobilinogen, Ur: 0.2 mg/dL (ref 0.2–1.0)
pH, UA: 7.5 (ref 5.0–7.5)

## 2022-12-22 LAB — TSH: TSH: 3.17 u[IU]/mL (ref 0.450–4.500)

## 2022-12-22 LAB — SPECIMEN STATUS REPORT

## 2023-01-01 DIAGNOSIS — M25511 Pain in right shoulder: Secondary | ICD-10-CM | POA: Diagnosis not present

## 2023-01-01 DIAGNOSIS — F17210 Nicotine dependence, cigarettes, uncomplicated: Secondary | ICD-10-CM | POA: Diagnosis not present

## 2023-01-01 DIAGNOSIS — R69 Illness, unspecified: Secondary | ICD-10-CM | POA: Diagnosis not present

## 2023-01-01 DIAGNOSIS — M545 Low back pain, unspecified: Secondary | ICD-10-CM | POA: Diagnosis not present

## 2023-01-01 DIAGNOSIS — M79606 Pain in leg, unspecified: Secondary | ICD-10-CM | POA: Diagnosis not present

## 2023-01-07 DIAGNOSIS — F419 Anxiety disorder, unspecified: Secondary | ICD-10-CM | POA: Diagnosis not present

## 2023-01-07 DIAGNOSIS — R69 Illness, unspecified: Secondary | ICD-10-CM | POA: Diagnosis not present

## 2023-01-07 DIAGNOSIS — Z833 Family history of diabetes mellitus: Secondary | ICD-10-CM | POA: Diagnosis not present

## 2023-01-07 DIAGNOSIS — Z8249 Family history of ischemic heart disease and other diseases of the circulatory system: Secondary | ICD-10-CM | POA: Diagnosis not present

## 2023-01-07 DIAGNOSIS — M1990 Unspecified osteoarthritis, unspecified site: Secondary | ICD-10-CM | POA: Diagnosis not present

## 2023-01-07 DIAGNOSIS — I10 Essential (primary) hypertension: Secondary | ICD-10-CM | POA: Diagnosis not present

## 2023-01-07 DIAGNOSIS — I252 Old myocardial infarction: Secondary | ICD-10-CM | POA: Diagnosis not present

## 2023-01-07 DIAGNOSIS — Z809 Family history of malignant neoplasm, unspecified: Secondary | ICD-10-CM | POA: Diagnosis not present

## 2023-01-07 DIAGNOSIS — E785 Hyperlipidemia, unspecified: Secondary | ICD-10-CM | POA: Diagnosis not present

## 2023-01-07 DIAGNOSIS — K5900 Constipation, unspecified: Secondary | ICD-10-CM | POA: Diagnosis not present

## 2023-01-07 DIAGNOSIS — N529 Male erectile dysfunction, unspecified: Secondary | ICD-10-CM | POA: Diagnosis not present

## 2023-01-07 DIAGNOSIS — Z85828 Personal history of other malignant neoplasm of skin: Secondary | ICD-10-CM | POA: Diagnosis not present

## 2023-01-07 DIAGNOSIS — H9193 Unspecified hearing loss, bilateral: Secondary | ICD-10-CM | POA: Diagnosis not present

## 2023-01-24 ENCOUNTER — Encounter: Payer: Self-pay | Admitting: Dermatology

## 2023-01-24 ENCOUNTER — Ambulatory Visit (INDEPENDENT_AMBULATORY_CARE_PROVIDER_SITE_OTHER): Payer: Medicare HMO | Admitting: Dermatology

## 2023-01-24 VITALS — BP 108/70

## 2023-01-24 DIAGNOSIS — L814 Other melanin hyperpigmentation: Secondary | ICD-10-CM

## 2023-01-24 DIAGNOSIS — Z85828 Personal history of other malignant neoplasm of skin: Secondary | ICD-10-CM | POA: Diagnosis not present

## 2023-01-24 DIAGNOSIS — L570 Actinic keratosis: Secondary | ICD-10-CM

## 2023-01-24 DIAGNOSIS — Z86006 Personal history of melanoma in-situ: Secondary | ICD-10-CM | POA: Diagnosis not present

## 2023-01-24 DIAGNOSIS — Z1283 Encounter for screening for malignant neoplasm of skin: Secondary | ICD-10-CM

## 2023-01-24 DIAGNOSIS — L578 Other skin changes due to chronic exposure to nonionizing radiation: Secondary | ICD-10-CM | POA: Diagnosis not present

## 2023-01-24 DIAGNOSIS — L821 Other seborrheic keratosis: Secondary | ICD-10-CM | POA: Diagnosis not present

## 2023-01-24 NOTE — Patient Instructions (Signed)
Recommend DerMend cream.  Recommend taking Heliocare sun protection supplement daily in sunny weather for additional sun protection. For maximum protection on the sunniest days, you can take up to 2 capsules of regular Heliocare OR take 1 capsule of Heliocare Ultra. For prolonged exposure (such as a full day in the sun), you can repeat your dose of the supplement 4 hours after your first dose. Heliocare can be purchased at [employer], at some [facility] or at [email or web address].    Melanoma ABCDEs  Melanoma is the most dangerous type of skin cancer, and is the leading cause of death from skin disease.  You are more likely to develop melanoma if you: Have light-colored skin, light-colored eyes, or red or blond hair Spend a lot of time in the sun Tan regularly, either outdoors or in a tanning bed Have had blistering sunburns, especially during childhood Have a close family member who has had a melanoma Have atypical moles or large birthmarks  Early detection of melanoma is key since treatment is typically straightforward and cure rates are extremely high if we catch it early.   The first sign of melanoma is often a change in a mole or a new dark spot.  The ABCDE system is a way of remembering the signs of melanoma.  A for asymmetry:  The two halves do not match. B for border:  The edges of the growth are irregular. C for color:  A mixture of colors are present instead of an even brown color. D for diameter:  Melanomas are usually (but not always) greater than 33m - the size of a pencil eraser. E for evolution:  The spot keeps changing in size, shape, and color.  Please check your skin once per month between visits. You can use a small mirror in front and a large mirror behind you to keep an eye on the back side or your body.   If you see any new or changing lesions before your next follow-up, please call to schedule a visit.  Please continue daily skin protection including broad  spectrum sunscreen SPF 30+ to sun-exposed areas, reapplying every 2 hours as needed when you're outdoors.    Due to recent changes in healthcare laws, you may see results of your pathology and/or laboratory studies on MyChart before the doctors have had a chance to review them. We understand that in some cases there may be results that are confusing or concerning to you. Please understand that not all results are received at the same time and often the doctors may need to interpret multiple results in order to provide you with the best plan of care or course of treatment. Therefore, we ask that you please give u[place]2 business days to thoroughly review all your results before contacting the office for clarification. Should we see a critical lab result, you will be contacted sooner.   If You Need Anything After Your Visit  If you have any questions or concerns for your doctor, please call our main line at [phone] press option 4 to reach your doctor's medical assistant. If no one answers, please leave a voicemail as directed and we will return your call as soon as possible. Messages left after 4 pm will be answered the following business day.   You may also send [place] message via [place] We typically respond to MyChart messages within 1-2 business days.  For prescription refills, please ask your pharmacy to contact our office. Our fax number is [phone]  If you have an urgent issue when the clinic is closed that cannot wait until the next business day, you can page your doctor at the number below.    Please note that while we do our best to be available for urgent issues outside of office hours, we are not available 24/7.   If you have an urgent issue and are unable to reach [place], you may choose to seek medical care at your doctor's office, retail clinic, urgent care center, or emergency room.  If you have a medical emergency, please immediately call 911 or go to the emergency  department.  Pager Numbers  - Dr. Nehemiah Massed: [phone]  - Dr. Laurence Ferrari: [phone]  - Dr. Nicole Kindred: [phone]  In the event of inclement weather, please call our main line at [phone] for an update on the status of any delays or closures.  Dermatology Medication Tips: Please keep the boxes that topical medications come in in order to help keep track of the instructions about where and how to use these. Pharmacies typically print the medication instructions only on the boxes and not directly on the medication tubes.   If your medication is too expensive, please contact our office at [phone] option 4 or send [place] a message through [place].   We are unable to tell what your co-pay for medications will be in advance as this is different depending on your insurance coverage. However, we may be able to find a substitute medication at lower cost or fill out paperwork to get insurance to cover a needed medication.   If a prior authorization is required to get your medication covered by your insurance company, please allow [place] 1-2 business days to complete this process.  Drug prices often vary depending on where the prescription is filled and some pharmacies may offer cheaper prices.  The website www.goodrx.com contains coupons for medications through different pharmacies. The prices here do not account for what the cost may be with help from insurance (it may be cheaper with your insurance), but the website can give you the price if you did not use any insurance.  - You can print the associated coupon and take it with your prescription to the pharmacy.  - You may also stop by our office during regular business hours and pick up a GoodRx coupon card.  - If you need your prescription sent electronically to a different pharmacy, notify our office through [facility] or by phone at [phone] option 4.     Si Usted Necesita Algo Despus de Su Visita  Tambin puede enviarnos un  mensaje a travs de Pharmacist, community. Por lo general respondemos a los mensajes de MyChart en el transcurso de 1 a 2 das hbiles.  Para renovar recetas, por favor pida a su farmacia que se ponga en contacto con nuestra oficina. Harland Dingwall de fax es [place] [phone].  Si tiene un asunto urgente cuando la clnica est cerrada y que no puede esperar hasta el siguiente da hbil, puede llamar/localizar a su doctor(a) al nmero que aparece a continuacin.   Por favor, tenga en cuenta que aunque hacemos todo lo posible para estar disponibles para asuntos urgentes fuera del horario de [place], no estamos disponibles las 24 horas del da, los 7 das de la [place].   Si tiene un problema urgente y no puede comunicarse con nosotros, puede optar por buscar atencin mdica  en el consultorio de su doctor(a), en una clnica privada, en un centro de atencin urgente o en  una sala de emergencias.  Si tiene Engineering geologist, por favor llame inmediatamente al 911 o vaya a la sala de emergencias.  Nmeros de bper  - Dr. Nehemiah Massed: [phone]  - Dra. Moye: [phone]  - Dra. Nicole Kindred: [phone]  En caso de inclemencias del [place], por favor llame a Johnsie Kindred principal al [phone] para una actualizacin sobre el [place] de cualquier retraso o cierre.  Consejos para la medicacin en dermatologa: Por favor, guarde las cajas en las que vienen los medicamentos de uso tpico para ayudarle a seguir las instrucciones sobre dnde y cmo usarlos. Las farmacias generalmente imprimen las instrucciones del medicamento slo en las cajas y no directamente en los tubos del [place].   Si su medicamento es muy caro, por favor, pngase en contacto con Zigmund Daniel llamando al [phone] y presione la opcin 4 o envenos un mensaje a travs de Pharmacist, community.   No podemos decirle cul ser su copago por los medicamentos por adelantado ya que esto es diferente dependiendo de la cobertura de su seguro. Sin embargo,  es posible que podamos encontrar un medicamento sustituto a Electrical engineer un formulario para que el seguro cubra el medicamento que se considera necesario.   Si se requiere una autorizacin previa para que su compaa de seguros [place] su medicamento, por favor permtanos de 1 a 2 das hbiles para completar este proceso.  Los precios de los medicamentos varan con frecuencia dependiendo del Environmental consultant de dnde se surte la receta y alguna farmacias pueden ofrecer precios ms baratos.  El sitio web www.goodrx.com tiene cupones para medicamentos de Airline pilot. Los precios aqu no tienen en cuenta lo que podra costar con la ayuda del seguro (puede ser ms barato con su seguro), pero el sitio web puede darle el precio si no utiliz Research scientist (physical sciences).  - Puede imprimir el cupn correspondiente y llevarlo con su receta a la farmacia.  - Tambin puede pasar por nuestra oficina durante el horario de atencin regular y Charity fundraiser una tarjeta de cupones de GoodRx.  - Si necesita que su receta se enve electrnicamente a una farmacia diferente, informe a nuestra oficina a travs de MyChart de [place] o por telfono llamando al [phone] y presione la opcin 4.

## 2023-01-24 NOTE — Progress Notes (Signed)
Follow-Up Visit   Subjective  Brian [name] is a 89 y.o. male who presents for the following: fbse (The patient presents for Total-Body Skin Exam (TBSE) for skin cancer screening and mole check.  The patient has spots, moles and lesions to be evaluated, some may be new or changing and the patient has concerns that these could be cancer./).  Patient with hx MMis, SCCis, BCC.  The following portions of the chart were reviewed this encounter and updated as appropriate:   Tobacco  Allergies  Meds  Problems  Med Hx  Surg Hx  Fam Hx      Review of Systems:  No other skin or systemic complaints except as noted in HPI or Assessment and Plan.  Objective  Well appearing patient in no apparent distress; mood and affect are within normal limits.  A full examination was performed including scalp, head, eyes, ears, nose, lips, neck, chest, axillae, abdomen, back, buttocks, bilateral upper extremities, bilateral lower extremities, hands, feet, fingers, toes, fingernails, and toenails. All findings within normal limits unless otherwise noted below.  R Upper Arm x 1 Erythematous thin papules/macules with gritty scale.     Assessment & Plan  AK (actinic keratosis) R Upper Arm x 1  Actinic keratoses are precancerous spots that appear secondary to cumulative UV radiation exposure/sun exposure over time. They are chronic with expected duration over 1 year. A portion of actinic keratoses will progress to squamous cell carcinoma of the skin. It is not possible to reliably predict which spots will progress to skin cancer and so treatment is recommended to prevent development of skin cancer.  Recommend daily broad spectrum sunscreen SPF 30+ to sun-exposed areas, reapply every 2 hours as needed.  Recommend staying in the shade or wearing long sleeves, sun glasses (UVA+UVB protection) and wide brim hats (4-inch brim around the entire circumference of the hat). Call for new or changing  lesions.  Prior to procedure, discussed risks of blister formation, small wound, skin dyspigmentation, or rare scar following cryotherapy. Recommend Vaseline ointment to treated areas while healing.  Hypertrophic at R upper arm  Destruction of lesion - R Upper Arm x 1  Destruction method: cryotherapy   Informed consent: discussed and consent obtained   Lesion destroyed using liquid nitrogen: Yes   Cryotherapy cycles:  2 Outcome: patient tolerated procedure well with no complications   Post-procedure details: wound care instructions given     History of Basal Cell Carcinoma of the Skin - No evidence of recurrence today - Recommend regular full body skin exams - Recommend daily broad spectrum sunscreen SPF 30+ to sun-exposed areas, reapply every 2 hours as needed.  - Call if any new or changing lesions are noted between office visits  History of Melanoma in Situ - No evidence of recurrence today at right preauricular, Mohs 07/19/22  - Recommend regular full body skin exams - Recommend daily broad spectrum sunscreen SPF 30+ to sun-exposed areas, reapply every 2 hours as needed.  - Call if any new or changing lesions are noted between office visits  History of Squamous Cell Carcinoma in Situ of the Skin - No evidence of recurrence today - Recommend regular full body skin exams - Recommend daily broad spectrum sunscreen SPF 30+ to sun-exposed areas, reapply every 2 hours as needed.  - Call if any new or changing lesions are noted between office visits  Lentigines - Scattered tan macules - Due to sun exposure - Benign-appearing, observe - Recommend daily broad spectrum sunscreen  SPF 30+ to sun-exposed areas, reapply every 2 hours as needed. - Call for any changes  Seborrheic Keratoses - Stuck-on, waxy, tan-brown papules and/or plaques  - Benign-appearing - Discussed benign etiology and prognosis. - Observe - Call for any changes  Melanocytic Nevi - Tan-brown and/or  pink-flesh-colored symmetric macules and papules - Benign appearing on exam today - Observation - Call clinic for new or changing moles - Recommend daily use of broad spectrum spf 30+ sunscreen to sun-exposed areas.   Hemangiomas - Red papules - Discussed benign nature - Observe - Call for any changes  Actinic Damage - Chronic condition, secondary to cumulative UV/sun exposure - diffuse scaly erythematous macules with underlying dyspigmentation - Recommend daily broad spectrum sunscreen SPF 30+ to sun-exposed areas, reapply every 2 hours as needed.  - Staying in the shade or wearing long sleeves, sun glasses (UVA+UVB protection) and wide brim hats (4-inch brim around the entire circumference of the hat) are also recommended for sun protection.  - Call for new or changing lesions.  Skin cancer screening performed today.   Return in about 3 months (around 04/26/2023) for TBSE, Hx MMis, Hx SCCis, Hx BCC, Hx AK.  Graciella Belton, RMA, am acting as scribe for Forest Gleason, MD .  Documentation: I have reviewed the above documentation for accuracy and completeness, and I agree with the above.  Forest Gleason, MD

## 2023-04-16 DIAGNOSIS — F17210 Nicotine dependence, cigarettes, uncomplicated: Secondary | ICD-10-CM | POA: Diagnosis not present

## 2023-04-16 DIAGNOSIS — Z720 Tobacco use: Secondary | ICD-10-CM | POA: Diagnosis not present

## 2023-04-16 DIAGNOSIS — M545 Low back pain, unspecified: Secondary | ICD-10-CM | POA: Diagnosis not present

## 2023-04-16 DIAGNOSIS — M25511 Pain in right shoulder: Secondary | ICD-10-CM | POA: Diagnosis not present

## 2023-04-26 DIAGNOSIS — Z79899 Other long term (current) drug therapy: Secondary | ICD-10-CM | POA: Diagnosis not present

## 2023-05-02 ENCOUNTER — Encounter: Payer: Medicare HMO | Admitting: Dermatology

## 2023-06-07 DIAGNOSIS — Z79891 Long term (current) use of opiate analgesic: Secondary | ICD-10-CM | POA: Diagnosis not present

## 2023-06-20 ENCOUNTER — Ambulatory Visit (INDEPENDENT_AMBULATORY_CARE_PROVIDER_SITE_OTHER): Payer: Medicare HMO | Admitting: Family Medicine

## 2023-06-20 ENCOUNTER — Encounter: Payer: Self-pay | Admitting: Family Medicine

## 2023-06-20 VITALS — BP 106/67 | HR 51 | Temp 97.9°F | Wt 125.0 lb

## 2023-06-20 DIAGNOSIS — I2510 Atherosclerotic heart disease of native coronary artery without angina pectoris: Secondary | ICD-10-CM

## 2023-06-20 DIAGNOSIS — I119 Hypertensive heart disease without heart failure: Secondary | ICD-10-CM

## 2023-06-20 DIAGNOSIS — D0330 Melanoma in situ of unspecified part of face: Secondary | ICD-10-CM | POA: Insufficient documentation

## 2023-06-20 DIAGNOSIS — E782 Mixed hyperlipidemia: Secondary | ICD-10-CM

## 2023-06-20 DIAGNOSIS — I129 Hypertensive chronic kidney disease with stage 1 through stage 4 chronic kidney disease, or unspecified chronic kidney disease: Secondary | ICD-10-CM

## 2023-06-20 DIAGNOSIS — I700 Atherosclerosis of aorta: Secondary | ICD-10-CM

## 2023-06-20 MED ORDER — METOPROLOL SUCCINATE ER 25 MG PO TB24: 12.5000 mg | ORAL_TABLET | Freq: Every day | ORAL | Status: DC

## 2023-06-20 MED ORDER — ATORVASTATIN CALCIUM 80 MG PO TABS: ORAL_TABLET | ORAL | 1 refills | Status: DC

## 2023-06-20 MED ORDER — LOSARTAN POTASSIUM 25 MG PO TABS: 12.5000 mg | ORAL_TABLET | Freq: Every day | ORAL | Status: DC

## 2023-06-20 NOTE — Assessment & Plan Note (Signed)
Continue to follow with dermatology. Call with any concerns.  

## 2023-06-20 NOTE — Progress Notes (Signed)
BP 106/67   Pulse (!) 51   Temp 97.9 F (36.6 C) (Oral)   Wt 125 lb (56.7 kg)   SpO2 98%   BMI 20.80 kg/m    Subjective:    Patient ID: Brian [name], male    DOB: 1933-06-14, 89 y.o.   MRN: [id number]  HPI: Brian [name] is a 89 y.o. male  Chief Complaint  Patient presents with   Hypertension   Hyperlipidemia   HYPERTENSION / HYPERLIPIDEMIA Satisfied with current treatment? yes Duration of hypertension: chronic BP monitoring frequency: not checking BP medication side effects: no Past BP meds: metoprolol, losartan Duration of hyperlipidemia: chronic Cholesterol medication side effects: no Cholesterol supplements: none Past cholesterol medications: atorvastatin Medication compliance: excellent compliance Aspirin: yes Recent stressors: no Recurrent headaches: no Visual changes: no Palpitations: no Dyspnea: no Chest pain: no Lower extremity edema: no Dizzy/lightheaded: no  Relevant past medical, surgical, family and social history reviewed and updated as indicated. Interim medical history since our last visit reviewed. Allergies and medications reviewed and updated.  Review of Systems  Constitutional:  Positive for appetite change. Negative for activity change, chills, diaphoresis, fatigue, fever and unexpected weight change.  Respiratory: Negative.    Cardiovascular: Negative.   Gastrointestinal: Negative.   Musculoskeletal: Negative.   Neurological: Negative.   Psychiatric/Behavioral: Negative.      Per HPI unless specifically indicated above     Objective:    BP 106/67   Pulse (!) 51   Temp 97.9 F (36.6 C) (Oral)   Wt 125 lb (56.7 kg)   SpO2 98%   BMI 20.80 kg/m   Wt Readings from Last 3 Encounters:  06/20/23 125 lb (56.7 kg)  12/20/22 124 lb (56.2 kg)  09/18/22 127 lb (57.6 kg)    Physical Exam Vitals and nursing note reviewed.  Constitutional:      General: He is not in acute distress.    Appearance: Normal appearance. He is not  ill-appearing, toxic-appearing or diaphoretic.  HENT:     Head: Normocephalic and atraumatic.     Right Ear: External ear normal.     Left Ear: External ear normal.     Nose: Nose normal.     Mouth/Throat:     Mouth: Mucous membranes are moist.     Pharynx: Oropharynx is clear.  Eyes:     General: No scleral icterus.       Right eye: No discharge.        Left eye: No discharge.     Extraocular Movements: Extraocular movements intact.     Conjunctiva/sclera: Conjunctivae normal.     Pupils: Pupils are equal, round, and reactive to light.  Cardiovascular:     Rate and Rhythm: Normal rate and regular rhythm.     Pulses: Normal pulses.     Heart sounds: Normal heart sounds. No murmur heard.    No friction rub. No gallop.  Pulmonary:     Effort: Pulmonary effort is normal. No respiratory distress.     Breath sounds: Normal breath sounds. No stridor. No wheezing, rhonchi or rales.  Chest:     Chest wall: No tenderness.  Musculoskeletal:        General: Normal range of motion.     Cervical back: Normal range of motion and neck supple.  Skin:    General: Skin is warm and dry.     Capillary Refill: Capillary refill takes less than 2 seconds.     Coloration: Skin is not jaundiced or  pale.     Findings: No bruising, erythema, lesion or rash.  Neurological:     General: No focal deficit present.     Mental Status: He is alert and oriented to person, place, and time. Mental status is at baseline.  Psychiatric:        Mood and Affect: Mood normal.        Behavior: Behavior normal.        Thought Content: Thought content normal.        Judgment: Judgment normal.     Results for orders placed or performed in visit on 12/20/22  Comprehensive metabolic panel  Result Value Ref Range   Glucose 118 (H) 70 - 99 mg/dL   BUN 8 8 - 27 mg/dL   Creatinine, Ser 5.78 0.76 - 1.27 mg/dL   eGFR 87 >46 NG/EXB/2.84   BUN/Creatinine Ratio 10 10 - 24   Sodium 143 134 - 144 mmol/L   Potassium 4.5  3.5 - 5.2 mmol/L   Chloride 104 96 - 106 mmol/L   CO2 25 20 - 29 mmol/L   Calcium 9.4 8.6 - 10.2 mg/dL   Total Protein 6.9 6.0 - 8.5 g/dL   Albumin 4.0 3.7 - 4.7 g/dL   Globulin, Total 2.9 1.5 - 4.5 g/dL   Albumin/Globulin Ratio 1.4 1.2 - 2.2   Bilirubin Total 0.5 0.0 - 1.2 mg/dL   Alkaline Phosphatase 93 44 - 121 IU/L   AST 17 0 - 40 IU/L   ALT 12 0 - 44 IU/L  CBC with Differential/Platelet  Result Value Ref Range   WBC 6.1 3.4 - 10.8 x10E3/uL   RBC 4.41 4.14 - 5.80 x10E6/uL   Hemoglobin 14.1 13.0 - 17.7 g/dL   Hematocrit 13.2 44.0 - 51.0 %   MCV 93 79 - 97 fL   MCH 32.0 26.6 - 33.0 pg   MCHC 34.3 31.5 - 35.7 g/dL   RDW 10.2 72.5 - 36.6 %   Platelets 184 150 - 450 x10E3/uL   Neutrophils 64 Not Estab. %   Lymphs 23 Not Estab. %   Monocytes 8 Not Estab. %   Eos 4 Not Estab. %   Basos 1 Not Estab. %   Neutrophils Absolute 4.0 1.4 - 7.0 x10E3/uL   Lymphocytes Absolute 1.4 0.7 - 3.1 x10E3/uL   Monocytes Absolute 0.5 0.1 - 0.9 x10E3/uL   EOS (ABSOLUTE) 0.2 0.0 - 0.4 x10E3/uL   Basophils Absolute 0.0 0.0 - 0.2 x10E3/uL   Immature Granulocytes 0 Not Estab. %   Immature Grans (Abs) 0.0 0.0 - 0.1 x10E3/uL  Lipid Panel w/o Chol/HDL Ratio  Result Value Ref Range   Cholesterol, Total 149 100 - 199 mg/dL   Triglycerides 83 0 - 149 mg/dL   HDL 69 >44 mg/dL   VLDL Cholesterol Cal 16 5 - 40 mg/dL   LDL Chol Calc (NIH) 64 0 - 99 mg/dL  TSH  Result Value Ref Range   TSH 3.170 0.450 - 4.500 uIU/mL  Urinalysis, Routine w reflex microscopic  Result Value Ref Range   Specific Gravity, UA 1.007 1.005 - 1.030   pH, UA 7.5 5.0 - 7.5   Color, UA Yellow Yellow   Appearance Ur Clear Clear   Leukocytes,UA Negative Negative   Protein,UA Negative Negative/Trace   Glucose, UA Negative Negative   Ketones, UA Negative Negative   RBC, UA Negative Negative   Bilirubin, UA Negative Negative   Urobilinogen, Ur 0.2 0.2 - 1.0 mg/dL   Nitrite, UA Negative Negative  Microscopic Examination Comment    Urine Microalbumin w/creat. ratio  Result Value Ref Range   Creatinine, Urine 33.0 Not Estab. mg/dL   Microalbumin, Urine 4.9 Not Estab. ug/mL   Microalb/Creat Ratio 15 0 - 29 mg/g creat  Specimen status report  Result Value Ref Range   specimen status report Comment       Assessment & Plan:   Problem List Items Addressed This Visit       Cardiovascular and Mediastinum   CAD (coronary artery disease)   Relevant Medications   losartan (COZAAR) 25 MG tablet   atorvastatin (LIPITOR) 80 MG tablet   metoprolol succinate (TOPROL-XL) 25 MG 24 hr tablet   Aortic atherosclerosis (HCC) - Primary    Will keep BP and cholesterol under good control. Call with any concerns. Continue to monitor.       Relevant Medications   losartan (COZAAR) 25 MG tablet   atorvastatin (LIPITOR) 80 MG tablet   metoprolol succinate (TOPROL-XL) 25 MG 24 hr tablet   Hypertension with heart disease   Relevant Medications   losartan (COZAAR) 25 MG tablet   atorvastatin (LIPITOR) 80 MG tablet   metoprolol succinate (TOPROL-XL) 25 MG 24 hr tablet     Musculoskeletal and Integument   Melanoma in situ of face excluding eyelid, nose, lip, and ear (HCC)    Continue to follow with dermatology. Call with any concerns.         Genitourinary   Benign hypertensive renal disease    BP and HR running low. Will cut his metoprolol and losartan in 1/2 and recheck in about a month. Labs at that time.         Other   Mixed hyperlipidemia    Under good control on current regimen. Continue current regimen. Continue to monitor. Call with any concerns. Refills given. Labs to be checked next visit.        Relevant Medications   losartan (COZAAR) 25 MG tablet   atorvastatin (LIPITOR) 80 MG tablet   metoprolol succinate (TOPROL-XL) 25 MG 24 hr tablet     Follow up plan: Return in about 4 weeks (around 07/18/2023).

## 2023-06-20 NOTE — Assessment & Plan Note (Signed)
Under good control on current regimen. Continue current regimen. Continue to monitor. Call with any concerns. Refills given. Labs to be checked next visit.

## 2023-06-20 NOTE — Assessment & Plan Note (Signed)
BP and HR running low. Will cut his metoprolol and losartan in 1/2 and recheck in about a month. Labs at that time.

## 2023-06-20 NOTE — Assessment & Plan Note (Signed)
Will keep BP and cholesterol under good control. Call with any concerns. Continue to monitor.  

## 2023-06-26 ENCOUNTER — Encounter: Payer: Self-pay | Admitting: Dermatology

## 2023-06-26 ENCOUNTER — Ambulatory Visit: Payer: Medicare HMO | Admitting: Dermatology

## 2023-06-26 VITALS — BP 106/67

## 2023-06-26 DIAGNOSIS — L578 Other skin changes due to chronic exposure to nonionizing radiation: Secondary | ICD-10-CM

## 2023-06-26 DIAGNOSIS — Z85828 Personal history of other malignant neoplasm of skin: Secondary | ICD-10-CM | POA: Diagnosis not present

## 2023-06-26 DIAGNOSIS — D1801 Hemangioma of skin and subcutaneous tissue: Secondary | ICD-10-CM | POA: Diagnosis not present

## 2023-06-26 DIAGNOSIS — Z1283 Encounter for screening for malignant neoplasm of skin: Secondary | ICD-10-CM

## 2023-06-26 DIAGNOSIS — L821 Other seborrheic keratosis: Secondary | ICD-10-CM | POA: Diagnosis not present

## 2023-06-26 DIAGNOSIS — Z86006 Personal history of melanoma in-situ: Secondary | ICD-10-CM

## 2023-06-26 DIAGNOSIS — L814 Other melanin hyperpigmentation: Secondary | ICD-10-CM | POA: Diagnosis not present

## 2023-06-26 DIAGNOSIS — W908XXA Exposure to other nonionizing radiation, initial encounter: Secondary | ICD-10-CM | POA: Diagnosis not present

## 2023-06-26 DIAGNOSIS — D229 Melanocytic nevi, unspecified: Secondary | ICD-10-CM

## 2023-06-26 NOTE — Patient Instructions (Signed)
Melanoma ABCDEs  Melanoma is the most dangerous type of skin cancer, and is the leading cause of death from skin disease.  You are more likely to develop melanoma if you: Have light-colored skin, light-colored eyes, or red or blond hair Spend a lot of time in the sun Tan regularly, either outdoors or in a tanning bed Have had blistering sunburns, especially during childhood Have a close family member who has had a melanoma Have atypical moles or large birthmarks  Early detection of melanoma is key since treatment is typically straightforward and cure rates are extremely high if we catch it early.   The first sign of melanoma is often a change in a mole or a new dark spot.  The ABCDE system is a way of remembering the signs of melanoma.  A for asymmetry:  The two halves do not match. B for border:  The edges of the growth are irregular. C for color:  A mixture of colors are present instead of an even brown color. D for diameter:  Melanomas are usually (but not always) greater than 6mm - the size of a pencil eraser. E for evolution:  The spot keeps changing in size, shape, and color.  Please check your skin once per month between visits. You can use a small mirror in front and a large mirror behind you to keep an eye on the back side or your body.   If you see any new or changing lesions before your next follow-up, please call to schedule a visit.  Please continue daily skin protection including broad spectrum sunscreen SPF 30+ to sun-exposed areas, reapplying every 2 hours as needed when you're outdoors.    Due to recent changes in healthcare laws, you may see results of your pathology and/or laboratory studies on MyChart before the doctors have had a chance to review them. We understand that in some cases there may be results that are confusing or concerning to you. Please understand that not all results are received at the same time and often the doctors may need to interpret multiple  results in order to provide you with the best plan of care or course of treatment. Therefore, we ask that you please give [place] 2 business days to thoroughly review all your results before contacting the office for clarification. Should we see a critical lab result, you will be contacted sooner.   If You Need Anything After Your Visit  If you have any questions or concerns for your doctor, please call our main line at [phone] and press option 4 to reach your doctor's medical assistant. If no one answers, please leave a voicemail as directed and we will return your call as soon as possible. Messages left after 4 pm will be answered the following business day.   You may also send [place] a message via MyChart. We typically respond to MyChart messages within 1-2 business days.  For prescription refills, please ask your pharmacy to contact our office. Our fax number is [phone].  If you have an urgent issue when the clinic is closed that cannot wait until the next business day, you can page your doctor at the number below.    Please note that while we do our best to be available for urgent issues outside of office hours, we are not available 24/7.   If you have an urgent issue and are unable to reach [place], you may choose to seek medical care at your doctor's office, retail clinic, urgent care  center, or emergency room.  If you have a medical emergency, please immediately call 911 or go to the emergency department.  Pager Numbers  - Dr. Gwen Pounds: [phone]  - Dr. Roseanne Reno: [phone]  - Dr. Katrinka Blazing: [phone]   In the event of inclement weather, please call our main line at [phone] for an update on the status of any delays or closures.  Dermatology Medication Tips: Please keep the boxes that topical medications come in in order to help keep track of the instructions about where and how to use these. Pharmacies typically print the medication instructions only on the boxes and not  directly on the medication tubes.   If your medication is too expensive, please contact our office at [phone] option 4 or send [place] a message through MyChart.   We are unable to tell what your co-pay for medications will be in advance as this is different depending on your insurance coverage. However, we may be able to find a substitute medication at lower cost or fill out paperwork to get insurance to cover a needed medication.   If a prior authorization is required to get your medication covered by your insurance company, please allow [place] 1-2 business days to complete this process.  Drug prices often vary depending on where the prescription is filled and some pharmacies may offer cheaper prices.  The website www.goodrx.com contains coupons for medications through different pharmacies. The prices here do not account for what the cost may be with help from insurance (it may be cheaper with your insurance), but the website can give you the price if you did not use any insurance.  - You can print the associated coupon and take it with your prescription to the pharmacy.  - You may also stop by our office during regular business hours and pick up a GoodRx coupon card.  - If you need your prescription sent electronically to a different pharmacy, notify our office through [facility] or by phone at [phone] option 4.

## 2023-06-26 NOTE — Progress Notes (Signed)
   Follow-Up Visit   Subjective  Brian [name] is a 89 y.o. male who presents for the following: Skin Cancer Screening and Full Body Skin Exam  The patient presents for Total-Body Skin Exam (TBSE) for skin cancer screening and mole check. The patient has spots, moles and lesions to be evaluated, some may be new or changing and the patient may have concern these could be cancer.  Patient with hx of BCC, SCC and MMis.  The following portions of the chart were reviewed this encounter and updated as appropriate: medications, allergies, medical history  Review of Systems:  No other skin or systemic complaints except as noted in HPI or Assessment and Plan.  Objective  Well appearing patient in no apparent distress; mood and affect are within normal limits.  A full examination was performed including scalp, head, eyes, ears, nose, lips, neck, chest, axillae, abdomen, back, buttocks, bilateral upper extremities, bilateral lower extremities, hands, feet, fingers, toes, fingernails, and toenails. All findings within normal limits unless otherwise noted below.   Relevant physical exam findings are noted in the Assessment and Plan.    Assessment & Plan   SKIN CANCER SCREENING PERFORMED TODAY.  ACTINIC DAMAGE - Chronic condition, secondary to cumulative UV/sun exposure - diffuse scaly erythematous macules with underlying dyspigmentation - Recommend daily broad spectrum sunscreen SPF 30+ to sun-exposed areas, reapply every 2 hours as needed.  - Staying in the shade or wearing long sleeves, sun glasses (UVA+UVB protection) and wide brim hats (4-inch brim around the entire circumference of the hat) are also recommended for sun protection.  - Call for new or changing lesions.  LENTIGINES, SEBORRHEIC KERATOSES, HEMANGIOMAS - Benign normal skin lesions - Benign-appearing - Call for any changes  MELANOCYTIC NEVI - Tan-brown and/or pink-flesh-colored symmetric macules and papules - Benign  appearing on exam today - Observation - Call clinic for new or changing moles - Recommend daily use of broad spectrum spf 30+ sunscreen to sun-exposed areas.   HISTORY OF BASAL CELL CARCINOMA OF THE SKIN - No evidence of recurrence today at right helix - Recommend regular full body skin exams - Recommend daily broad spectrum sunscreen SPF 30+ to sun-exposed areas, reapply every 2 hours as needed.  - Call if any new or changing lesions are noted between office visits  HISTORY OF SQUAMOUS CELL CARCINOMA OF THE SKIN - No evidence of recurrence today - No lymphadenopathy - Recommend regular full body skin exams - Recommend daily broad spectrum sunscreen SPF 30+ to sun-exposed areas, reapply every 2 hours as needed.  - Call if any new or changing lesions are noted between office visits  HISTORY OF MELANOMA IN SITU  - No evidence of recurrence today at right preauricular, Mohs 07/19/22  - Recommend regular full body skin exams - Recommend daily broad spectrum sunscreen SPF 30+ to sun-exposed areas, reapply every 2 hours as needed.  - Call if any new or changing lesions are noted between office visits      Return in about 6 months (around 12/27/2023) for TBSE, Hx MMis, Hx BCC, Hx SCC.  Anise Salvo, RMA, am acting as scribe for [name], MD .   Documentation: I have reviewed the above documentation for accuracy and completeness, and I agree with the above.  [name], MD

## 2023-06-27 ENCOUNTER — Encounter: Payer: Medicare HMO | Admitting: Dermatology

## 2023-07-03 ENCOUNTER — Ambulatory Visit (INDEPENDENT_AMBULATORY_CARE_PROVIDER_SITE_OTHER): Payer: Medicare HMO | Admitting: Emergency Medicine

## 2023-07-03 VITALS — Ht 68.0 in | Wt 125.0 lb

## 2023-07-03 DIAGNOSIS — Z0000 Encounter for general adult medical examination without abnormal findings: Secondary | ICD-10-CM | POA: Diagnosis not present

## 2023-07-03 NOTE — Patient Instructions (Addendum)
Brian [name] , Thank you for taking time to come for your Medicare Wellness Visit. I appreciate your ongoing commitment to your health goals. Please review the following plan we discussed and let me know if I can assist you in the future.   Referrals/Orders/Follow-Ups/Clinician Recommendations: Get your flu shot this fall. Recommend getting the shingles vaccine.  Schedule an eye exam at your earliest convenience.  This is a list of the screening recommended for you and due dates:  Health Maintenance  Topic Date Due   Zoster (Shingles) Vaccine (1 of 2) Never done   Flu Shot  06/14/2023   COVID-19 Vaccine (4 - 2023-24 season) 07/06/2023*   Medicare Annual Wellness Visit  07/02/2024   DTaP/Tdap/Td vaccine (2 - Tdap) 12/15/2030   Pneumonia Vaccine  Completed   HPV Vaccine  Aged Out  *Topic was postponed. The date shown is not the original due date.    Advanced directives: (Copy Requested) Please bring a copy of your health care power of attorney and living will to the office to be added to your chart at your convenience.  Next Medicare Annual Wellness Visit scheduled for next year: Yes, 07/08/24 @ 2:15pm  Preventive Care 65 Years and Older, Male  Preventive care refers to lifestyle choices and visits with your health care provider that can promote health and wellness. What does preventive care include? A yearly physical exam. This is also called an annual well check. Dental exams once or twice a year. Routine eye exams. Ask your health care provider how often you should have your eyes checked. Personal lifestyle choices, including: Daily care of your teeth and gums. Regular physical activity. Eating a healthy diet. Avoiding tobacco and drug use. Limiting alcohol use. Practicing safe sex. Taking low doses of aspirin every day. Taking vitamin and mineral supplements as recommended by your health care provider. What happens during an annual well check? The services and screenings done by  your health care provider during your annual well check will depend on your age, overall health, lifestyle risk factors, and family history of disease. Counseling  Your health care provider may ask you questions about your: Alcohol use. Tobacco use. Drug use. Emotional well-being. Home and relationship well-being. Sexual activity. Eating habits. History of falls. Memory and ability to understand (cognition). Work and work Astronomer. Screening  You may have the following tests or measurements: Height, weight, and BMI. Blood pressure. Lipid and cholesterol levels. These may be checked every 5 years, or more frequently if you are over 75 years old. Skin check. Lung cancer screening. You may have this screening every year starting at age 19 if you have a 30-pack-year history of smoking and currently smoke or have quit within the past 15 years. Fecal occult blood test (FOBT) of the stool. You may have this test every year starting at age 35. Flexible sigmoidoscopy or colonoscopy. You may have a sigmoidoscopy every 5 years or a colonoscopy every 10 years starting at age 54. Prostate cancer screening. Recommendations will vary depending on your family history and other risks. Hepatitis C blood test. Hepatitis B blood test. Sexually transmitted disease (STD) testing. Diabetes screening. This is done by checking your blood sugar (glucose) after you have not eaten for a while (fasting). You may have this done every 1-3 years. Abdominal aortic aneurysm (AAA) screening. You may need this if you are a current or former smoker. Osteoporosis. You may be screened starting at age 62 if you are at high risk. Talk with your  health care provider about your test results, treatment options, and if necessary, the need for more tests. Vaccines  Your health care provider may recommend certain vaccines, such as: Influenza vaccine. This is recommended every year. Tetanus, diphtheria, and acellular pertussis  (Tdap, Td) vaccine. You may need a Td booster every 10 years. Zoster vaccine. You may need this after age 38. Pneumococcal 13-valent conjugate (PCV13) vaccine. One dose is recommended after age 37. Pneumococcal polysaccharide (PPSV23) vaccine. One dose is recommended after age 66. Talk to your health care provider about which screenings and vaccines you need and how often you need them. This information is not intended to replace advice given to you by your health care provider. Make sure you discuss any questions you have with your health care provider. Document Released: 11/26/2015 Document Revised: 07/19/2016 Document Reviewed: 08/31/2015 Elsevier Interactive Patient Education  2017 [employer].  Fall Prevention in the Home Falls can cause injuries. They can happen to people of all ages. There are many things you can do to make your home safe and to help prevent falls. What can I do on the outside of my home? Regularly fix the edges of walkways and driveways and fix any cracks. Remove anything that might make you trip as you walk through a door, such as a raised step or threshold. Trim any bushes or trees on the path to your home. Use bright outdoor lighting. Clear any walking paths of anything that might make someone trip, such as rocks or tools. Regularly check to see if handrails are loose or broken. Make sure that both sides of any steps have handrails. Any raised decks and porches should have guardrails on the edges. Have any leaves, snow, or ice cleared regularly. Use sand or salt on walking paths during winter. Clean up any spills in your garage right away. This includes oil or grease spills. What can I do in the bathroom? Use night lights. Install grab bars by the toilet and in the tub and shower. Do not use towel bars as grab bars. Use non-skid mats or decals in the tub or shower. If you need to sit down in the shower, use a plastic, non-slip stool. Keep the floor dry. Clean  up any water that spills on the floor as soon as it happens. Remove soap buildup in the tub or shower regularly. Attach bath mats securely with double-sided non-slip rug tape. Do not have throw rugs and other things on the floor that can make you trip. What can I do in the bedroom? Use night lights. Make sure that you have a light by your bed that is easy to reach. Do not use any sheets or blankets that are too big for your bed. They should not hang down onto the floor. Have a firm chair that has side arms. You can use this for support while you get dressed. Do not have throw rugs and other things on the floor that can make you trip. What can I do in the kitchen? Clean up any spills right away. Avoid walking on wet floors. Keep items that you use a lot in easy-to-reach places. If you need to reach something above you, use a strong step stool that has a grab bar. Keep electrical cords out of the way. Do not use floor polish or wax that makes floors slippery. If you must use wax, use non-skid floor wax. Do not have throw rugs and other things on the floor that can make you trip. What  can I do with my stairs? Do not leave any items on the stairs. Make sure that there are handrails on both sides of the stairs and use them. Fix handrails that are broken or loose. Make sure that handrails are as long as the stairways. Check any carpeting to make sure that it is firmly attached to the stairs. Fix any carpet that is loose or worn. Avoid having throw rugs at the top or bottom of the stairs. If you do have throw rugs, attach them to the floor with carpet tape. Make sure that you have a light switch at the top of the stairs and the bottom of the stairs. If you do not have them, ask someone to add them for you. What else can I do to help prevent falls? Wear shoes that: Do not have high heels. Have rubber bottoms. Are comfortable and fit you well. Are closed at the toe. Do not wear sandals. If you  use a stepladder: Make sure that it is fully opened. Do not climb a closed stepladder. Make sure that both sides of the stepladder are locked into place. Ask someone to hold it for you, if possible. Clearly mark and make sure that you can see: Any grab bars or handrails. First and last steps. Where the edge of each step is. Use tools that help you move around (mobility aids) if they are needed. These include: Canes. Walkers. Scooters. Crutches. Turn on the lights when you go into a dark area. Replace any light bulbs as soon as they burn out. Set up your furniture so you have a clear path. Avoid moving your furniture around. If any of your floors are uneven, fix them. If there are any pets around you, be aware of where they are. Review your medicines with your doctor. Some medicines can make you feel dizzy. This can increase your chance of falling. Ask your doctor what other things that you can do to help prevent falls. This information is not intended to replace advice given to you by your health care provider. Make sure you discuss any questions you have with your health care provider. Document Released: 08/26/2009 Document Revised: 04/06/2016 Document Reviewed: 12/04/2014 Elsevier Interactive Patient Education  2017 [employer].

## 2023-07-03 NOTE — Progress Notes (Signed)
Subjective:   Brian [name] is a 89 y.o. male who presents for Medicare Annual/Subsequent preventive examination.  Visit Complete: Virtual  I connected with  James Ivanoff on 07/03/23 by a audio enabled telemedicine application and verified that I am speaking with the correct person using two identifiers.  Patient Location: Home  Provider Location: Home Office  I discussed the limitations of evaluation and management by telemedicine. The patient expressed understanding and agreed to proceed.   Vital Signs: Because this visit was a virtual/telehealth visit, some criteria may be missing or patient reported. Any vitals not documented were not able to be obtained and vitals that have been documented are patient reported.    Review of Systems     Cardiac Risk Factors include: advanced age (>62men, >12 women);male gender;dyslipidemia;hypertension     Objective:    Today's Vitals   07/03/23 1419  Weight: 125 lb (56.7 kg)  Height: 5\' 8"  (1.727 m)   Body mass index is 19.01 kg/m.     07/03/2023    2:36 PM 06/28/2022    9:05 AM 06/27/2021    9:01 AM 06/25/2020    9:02 AM 06/23/2019    9:34 AM 06/19/2018    9:51 AM 12/18/2016   10:30 AM  Advanced Directives  Does Patient Have a Medical Advance Directive? Yes Yes Yes Yes Yes Yes Yes  Type of Estate agent of [place];Living will Living will Healthcare Power of [place];Living will Healthcare Power of [place];Living will Living will;Healthcare Power of Attorney Living will;Healthcare Power of Attorney Living will;Healthcare Power of Attorney  Does patient want to make changes to medical advance directive? No - Patient declined      No - Patient declined  Copy of Healthcare Power of Attorney in Chart? No - copy requested  No - copy requested No - copy requested No - copy requested No - copy requested No - copy requested    Current Medications (verified) Outpatient Encounter Medications as of 07/03/2023  Medication  Sig   aspirin EC 81 MG tablet Take 1 tablet (81 mg total) by mouth daily. Swallow whole.   atorvastatin (LIPITOR) 80 MG tablet TAKE 1 TABLET BY MOUTH ONCE DAILY AT  6PM   diazepam (VALIUM) 5 MG tablet Take 5 mg by mouth every 8 (eight) hours as needed (for anxiety).    losartan (COZAAR) 25 MG tablet Take 0.5 tablets (12.5 mg total) by mouth daily.   metoprolol succinate (TOPROL-XL) 25 MG 24 hr tablet Take 0.5 tablets (12.5 mg total) by mouth daily.   mupirocin ointment (BACTROBAN) 2 % Apply 1 Application topically daily.   traMADol (ULTRAM) 50 MG tablet Take 1 tablet (50 mg total) by mouth every 4 (four) hours as needed.   nitroGLYCERIN (NITROSTAT) 0.4 MG SL tablet PLACE ONE TABLET UNDER THE TONGUE EVERY 5 MINUTES AS NEEDED FOR CHEST PAIN (Patient not taking: Reported on 07/03/2023)   No facility-administered encounter medications on file as of 07/03/2023.    Allergies (verified) Zoloft [sertraline hcl]   History: Past Medical History:  Diagnosis Date   AAA (abdominal aortic aneurysm) (HCC)    2.9 CM infrarenal AAA on CT 11/2011   Acute anterior wall MI (HCC) 05/29/2014   Alcoholism (HCC)    Aneurysm of abdominal aorta (HCC)    SMALL   Anxiety    panic attacks, rare.   Aortic aneurysm (HCC)    small   Arthritis    Basal cell carcinoma 06/21/2022   Right helix. 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    Gastritis 03/2012   GERD (gastroesophageal reflux disease)    GI bleed    HFrEF (heart failure with reduced ejection fraction) (HCC)    HOH (hard of hearing)    Hyperlipemia    Hypertension    Hypovolemic shock (HCC)    Kidney stones    Melanoma in situ (HCC) 06/21/2022   Right preauricular, Mohs 07/19/22   Myocardial infarction (HCC) 2015   Panic attacks    Positive H. pylori titer ?, before 2010   treated with Abx.    Squamous cell  carcinoma of skin 12/28/2021   SCC IS, R medial calf superior, EDC 01/23/22   Squamous cell carcinoma of skin 12/28/2021   SCC IS, R medial calf inferior, EDC 01/23/22   Tubular adenoma of colon    Past Surgical History:  Procedure Laterality Date   CARDIAC CATHETERIZATION  2015   Stents   CATARACT EXTRACTION W/PHACO Right 09/21/2016   Procedure: CATARACT EXTRACTION PHACO AND INTRAOCULAR LENS PLACEMENT (IOC);  Surgeon: Nevada Crane, MD;  Location: [facility] ORS;  Service: Ophthalmology;  Laterality: Right;  [place] 1.10 AP% 13.8 CDE 9.93 Fluid pack lot # [id number]   CATARACT EXTRACTION W/PHACO Left 12/07/2016   Procedure: CATARACT EXTRACTION PHACO AND INTRAOCULAR LENS PLACEMENT (IOC);  Surgeon: Nevada Crane, MD;  Location: [facility] ORS;  Service: Ophthalmology;  Laterality: Left;  [place] 00:42.7 ap 13.7 cde 5.85 lot #[id number]   COLONOSCOPY WITH PROPOFOL Left 06/06/2015   Procedure: COLONOSCOPY WITH PROPOFOL;  Surgeon: Wallace Cullens, MD;  Location[facility] ENDOSCOPY;  Service: Endoscopy;  Laterality: Left;   CORONARY ANGIOPLASTY     2015   LEFT HEART CATHETERIZATION WITH CORONARY ANGIOGRAM Bilateral 05/29/2014   Procedure: LEFT HEART CATHETERIZATION WITH CORONARY ANGIOGRAM;  Surgeon: Pamella Pert, MD;  Location: [facility] CATH LAB;  Service: Cardiovascular;  Laterality: Bilateral;   PERCUTANEOUS CORONARY STENT INTERVENTION (PCI-S)  05/29/2014   Procedure: PERCUTANEOUS CORONARY STENT INTERVENTION (PCI-S);  Surgeon: Pamella Pert, MD;  Location: [facility] CATH LAB;  Service: Cardiovascular;;  DES x2 Prox and Mid LAD    Family History  Problem Relation Age of Onset   Heart disease Mother    Ulcers Father        stomach    Heart disease Father    Anuerysm Brother    Leukemia Brother    Heart disease Paternal Grandmother    Heart attack Paternal Grandmother    Heart disease Paternal Grandfather    Hypertension Daughter    Colon cancer Neg Hx    Social History   Socioeconomic History   Marital status:  Married    Spouse name: Aurea Graff   Number of children: 4   Years of education: Not on file   Highest education level: High school graduate  Occupational History   Occupation: semi retired  Tobacco Use   Smoking status: Former    Current packs/day: 0.00    Average packs/day: 0.3 packs/day for 56.7 years (14.2 ttl pk-yrs)    Types: Cigarettes, Cigars    Start date: 06/1966    Quit date: 03/2023    Years since quitting: 0.3   Smokeless tobacco: Never   Tobacco comments:    4 cigarettes per day, restarted and quit ~03/2023  Vaping Use   Vaping status: Never  Used  Substance and Sexual Activity   Alcohol use: No    Alcohol/week: 0.0 standard drinks of alcohol   Drug use: No   Sexual activity: Not Currently  Other Topics Concern   Not on file  Social History Narrative   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    Social Determinants of Health   Financial Resource Strain: Low Risk  (07/03/2023)   Overall Financial Resource Strain (CARDIA)    Difficulty of Paying Living Expenses: Not hard at all  Food Insecurity: No Food Insecurity (07/03/2023)   Hunger Vital Sign    Worried About Running Out of Food in the Last Year: Never true    Ran Out of Food in the Last Year: Never true  Transportation Needs: No Transportation Needs (07/03/2023)   PRAPARE - Administrator, Civil Service (Medical): No    Lack of Transportation (Non-Medical): No  Physical Activity: Insufficiently Active (07/03/2023)   Exercise Vital Sign    Days of Exercise per Week: 3 days    Minutes of Exercise per Session: 10 min  Stress: No Stress Concern Present (07/03/2023)   [employer] of Occupational Health - Occupational Stress Questionnaire    Feeling of Stress : Only a little  Social Connections: Moderately Integrated (07/03/2023)   Social Connection and Isolation Panel [NHANES]    Frequency of Communication with Friends and Family: More than three times a week    Frequency of Social Gatherings  with Friends and Family: Once a week    Attends Religious Services: More than 4 times per year    Active Member of [employer] or Organizations: No    Attends Banker Meetings: Never    Marital Status: Married    Tobacco Counseling Counseling given: Not Answered Tobacco comments: 4 cigarettes per day, restarted and quit ~03/2023   Clinical Intake:  Pre-visit preparation completed: Yes  Pain : No/denies pain     BMI - recorded: 19.01 Nutritional Status: BMI of 19-24  Normal Nutritional Risks: Nausea/ vomitting/ diarrhea (slight nausea sometimes) Diabetes: No  How often do you need to have someone help you when you read instructions, pamphlets, or other written materials from your doctor or pharmacy?: 1 - Never  Interpreter Needed?: No  Information entered by :: Tora Kindred, CMA   Activities of Daily Living    07/03/2023    2:21 PM  In your present state of health, do you have any difficulty performing the following activities:  Hearing? 1  Comment wears hearing aids  Vision? 0  Difficulty concentrating or making decisions? 0  Walking or climbing stairs? 0  Dressing or bathing? 0  Doing errands, shopping? 0  Preparing Food and eating ? N  Using the Toilet? N  In the past six months, have you accidently leaked urine? N  Do you have problems with loss of bowel control? N  Managing your Medications? N  Managing your Finances? N  Housekeeping or managing your Housekeeping? N    Patient Care Team: Dorcas Carrow, DO as PCP - General (Family Medicine) Pyrtle, Carie Caddy, MD as Consulting Physician (Gastroenterology) Ronita Hipps, MD as Referring Physician (Anesthesiology) Earl Many, MD as Referring Physician (Cardiology)  Indicate any recent Medical Services you may have received from other than [facility] providers in the past year (date may be approximate).     Assessment:   This is a routine wellness examination for [place].  Hearing/Vision  screen Hearing Screening -  Comments:: Wears hearing aids Vision Screening - Comments:: Last eye exam &gt;4 years  Dietary issues and exercise activities discussed:     Goals Addressed               This Visit's Progress     Patient Stated (pt-stated)        Drink more water      Depression Screen    07/03/2023    2:33 PM 06/20/2023    8:45 AM 12/20/2022    8:54 AM 06/28/2022    9:10 AM 12/19/2021    8:46 AM 06/27/2021    9:02 AM 06/15/2021    9:27 AM  PHQ 2/9 Scores  PHQ - 2 Score 1 1 0 1 2 0 0  PHQ- 9 Score 1 3 2 1 6   0    Fall Risk    07/03/2023    2:39 PM 06/20/2023    8:45 AM 12/20/2022    8:53 AM 12/19/2021    8:47 AM 06/27/2021    9:02 AM  Fall Risk   Falls in the past year? 1 1 1  0 0  Number falls in past yr: 0 1 0 0   Injury with Fall? 0 0 0 0   Risk for fall due to : History of fall(s)  No Fall Risks No Fall Risks Medication side effect  Follow up Falls prevention discussed;Falls evaluation completed  Falls evaluation completed Falls evaluation completed Falls evaluation completed;Education provided;Falls prevention discussed    MEDICARE RISK AT HOME: Medicare Risk at Home Any stairs in or around the home?: Yes If so, are there any without handrails?: No Home free of loose throw rugs in walkways, pet beds, electrical cords, etc?: Yes Adequate lighting in your home to reduce risk of falls?: Yes Life alert?: No Use of a cane, walker or w/c?: No Grab bars in the bathroom?: Yes Shower chair or bench in shower?: No Elevated toilet seat or a handicapped toilet?: Yes  TIMED UP AND GO:  Was the test performed?  No    Cognitive Function:        07/03/2023    2:41 PM 06/28/2022    9:07 AM 06/27/2021    9:05 AM 06/25/2020    9:07 AM 06/19/2018    9:53 AM  6CIT Screen  What Year? 0 points 0 points 0 points 0 points 0 points  What month? 0 points 0 points 0 points 0 points 0 points  What time? 0 points 0 points 0 points 0 points 0 points  Count back from 20 0 points  0 points 0 points 0 points 0 points  Months in reverse 2 points 0 points 0 points 0 points 0 points  Repeat phrase 0 points 2 points 0 points 2 points 4 points  Total Score 2 points 2 points 0 points 2 points 4 points    Immunizations Immunization History  Administered Date(s) Administered   Fluad Quad(high Dose 65+) 10/02/2019   Influenza, High Dose Seasonal PF 07/22/2018, 08/20/2020, 08/29/2021   Influenza-Unspecified 07/15/2015, 07/14/2017, 10/09/2022   PFIZER(Purple Top)SARS-COV-2 Vaccination 12/05/2019, 12/26/2019, 08/20/2020   Pneumococcal Conjugate-13 12/18/2016   Pneumococcal Polysaccharide-23 12/25/2017   Td 12/15/2020    TDAP status: Up to date  Flu Vaccine status: Due, Education has been provided regarding the importance of this vaccine. Advised may receive this vaccine at local pharmacy or Health Dept. Aware to provide a copy of the vaccination record if obtained from local pharmacy or Health Dept. Verbalized acceptance and understanding.  Pneumococcal vaccine status: Up to date  Covid-19 vaccine status: Declined, Education has been provided regarding the importance of this vaccine but patient still declined. Advised may receive this vaccine at local pharmacy or Health Dept.or vaccine clinic. Aware to provide a copy of the vaccination record if obtained from local pharmacy or Health Dept. Verbalized acceptance and understanding.  Qualifies for Shingles Vaccine? Yes   Zostavax completed No   Shingrix Completed?: No.    Education has been provided regarding the importance of this vaccine. Patient has been advised to call insurance company to determine out of pocket expense if they have not yet received this vaccine. Advised may also receive vaccine at local pharmacy or Health Dept. Verbalized acceptance and understanding.  Screening Tests Health Maintenance  Topic Date Due   Zoster Vaccines- Shingrix (1 of 2) Never done   INFLUENZA VACCINE  06/14/2023   COVID-19 Vaccine  (4 - 2023-24 season) 07/06/2023 (Originally 07/14/2022)   Medicare Annual Wellness (AWV)  07/02/2024   DTaP/Tdap/Td (2 - Tdap) 12/15/2030   Pneumonia Vaccine 37+ Years old  Completed   HPV VACCINES  Aged Out    Health Maintenance  Health Maintenance Due  Topic Date Due   Zoster Vaccines- Shingrix (1 of 2) Never done   INFLUENZA VACCINE  06/14/2023    Colorectal cancer screening: No longer required.   Lung Cancer Screening: (Low Dose CT Chest recommended if Age 39-80 years, 20 pack-year currently smoking OR have quit w/in 15years.) does not qualify.   Lung Cancer Screening Referral: n/a  Additional Screening:  Hepatitis C Screening: does not qualify.  Vision Screening: Recommended annual ophthalmology exams for early detection of glaucoma and other disorders of the eye.  Dental Screening: Recommended annual dental exams for proper oral hygiene  Community Resource Referral / Chronic Care Management: CRR required this visit?  No   CCM required this visit?  No     Plan:     I have personally reviewed and noted the following in the patient's chart:   Medical and social history Use of alcohol, tobacco or illicit drugs  Current medications and supplements including opioid prescriptions. Patient is not currently taking opioid prescriptions. Functional ability and status Nutritional status Physical activity Advanced directives List of other physicians Hospitalizations, surgeries, and ER visits in previous 12 months Vitals Screenings to include cognitive, depression, and falls Referrals and appointments  In addition, I have reviewed and discussed with patient certain preventive protocols, quality metrics, and best practice recommendations. A written personalized care plan for preventive services as well as general preventive health recommendations were provided to patient.     Tora Kindred, CMA   07/03/2023   After Visit Summary: (MyChart) Due to this being a  telephonic visit, the after visit summary with patients personalized plan was offered to patient via MyChart   Nurse Notes:  6 CIT Score - 2 Needs eye exam. Declined referral. Last eye exam ~4 years ago. Will get flu shot in the fall Needs shingles vaccine

## 2023-07-13 DIAGNOSIS — Z79891 Long term (current) use of opiate analgesic: Secondary | ICD-10-CM | POA: Diagnosis not present

## 2023-08-07 ENCOUNTER — Ambulatory Visit (INDEPENDENT_AMBULATORY_CARE_PROVIDER_SITE_OTHER): Payer: Medicare HMO | Admitting: Family Medicine

## 2023-08-07 ENCOUNTER — Encounter: Payer: Self-pay | Admitting: Family Medicine

## 2023-08-07 VITALS — BP 128/80 | HR 65 | Ht 68.0 in | Wt 125.6 lb

## 2023-08-07 DIAGNOSIS — I129 Hypertensive chronic kidney disease with stage 1 through stage 4 chronic kidney disease, or unspecified chronic kidney disease: Secondary | ICD-10-CM | POA: Diagnosis not present

## 2023-08-07 DIAGNOSIS — Z23 Encounter for immunization: Secondary | ICD-10-CM | POA: Diagnosis not present

## 2023-08-07 NOTE — Progress Notes (Signed)
BP 128/80   Pulse 65   Ht 5\' 8"  (1.727 m)   Wt 125 lb 9.6 oz (57 kg)   SpO2 95%   BMI 19.10 kg/m    Subjective:    Patient ID: Brian [name], male    DOB: Aug 29, 1933, 89 y.o.   MRN: [id number]  HPI: Brian [name] is a 89 y.o. male  Chief Complaint  Patient presents with   Hypertension   HYPERTENSION  Hypertension status: controlled  Satisfied with current treatment? yes Duration of hypertension: chronic BP monitoring frequency:  rarely BP medication side effects:  no Medication compliance: excellent compliance Previous BP meds:losartan, metoprolol Aspirin: no Recurrent headaches: no Visual changes: no Palpitations: no Dyspnea: no Chest pain: no Lower extremity edema: no Dizzy/lightheaded: no  Relevant past medical, surgical, family and social history reviewed and updated as indicated. Interim medical history since our last visit reviewed. Allergies and medications reviewed and updated.  Review of Systems  Constitutional: Negative.   Respiratory: Negative.    Cardiovascular: Negative.   Musculoskeletal: Negative.   Psychiatric/Behavioral: Negative.      Per HPI unless specifically indicated above     Objective:    BP 128/80   Pulse 65   Ht 5\' 8"  (1.727 m)   Wt 125 lb 9.6 oz (57 kg)   SpO2 95%   BMI 19.10 kg/m   Wt Readings from Last 3 Encounters:  08/07/23 125 lb 9.6 oz (57 kg)  07/03/23 125 lb (56.7 kg)  06/20/23 125 lb (56.7 kg)    Physical Exam Vitals and nursing note reviewed.  Constitutional:      General: He is not in acute distress.    Appearance: Normal appearance. He is not ill-appearing, toxic-appearing or diaphoretic.  HENT:     Head: Normocephalic and atraumatic.     Right Ear: External ear normal.     Left Ear: External ear normal.     Nose: Nose normal.     Mouth/Throat:     Mouth: Mucous membranes are moist.     Pharynx: Oropharynx is clear.  Eyes:     General: No scleral icterus.       Right eye: No discharge.         Left eye: No discharge.     Extraocular Movements: Extraocular movements intact.     Conjunctiva/sclera: Conjunctivae normal.     Pupils: Pupils are equal, round, and reactive to light.  Cardiovascular:     Rate and Rhythm: Normal rate and regular rhythm.     Pulses: Normal pulses.     Heart sounds: Normal heart sounds. No murmur heard.    No friction rub. No gallop.  Pulmonary:     Effort: Pulmonary effort is normal. No respiratory distress.     Breath sounds: Normal breath sounds. No stridor. No wheezing, rhonchi or rales.  Chest:     Chest wall: No tenderness.  Musculoskeletal:        General: Normal range of motion.     Cervical back: Normal range of motion and neck supple.  Skin:    General: Skin is warm and dry.     Capillary Refill: Capillary refill takes less than 2 seconds.     Coloration: Skin is not jaundiced or pale.     Findings: No bruising, erythema, lesion or rash.  Neurological:     General: No focal deficit present.     Mental Status: He is alert and oriented to person, place, and time. Mental  status is at baseline.  Psychiatric:        Mood and Affect: Mood normal.        Behavior: Behavior normal.        Thought Content: Thought content normal.        Judgment: Judgment normal.     Results for orders placed or performed in visit on 12/20/22  Comprehensive metabolic panel  Result Value Ref Range   Glucose 118 (H) 70 - 99 mg/dL   BUN 8 8 - 27 mg/dL   Creatinine, Ser 4.54 0.76 - 1.27 mg/dL   eGFR 87 >09 WJ/XBJ/4.78   BUN/Creatinine Ratio 10 10 - 24   Sodium 143 134 - 144 mmol/L   Potassium 4.5 3.5 - 5.2 mmol/L   Chloride 104 96 - 106 mmol/L   CO2 25 20 - 29 mmol/L   Calcium 9.4 8.6 - 10.2 mg/dL   Total Protein 6.9 6.0 - 8.5 g/dL   Albumin 4.0 3.7 - 4.7 g/dL   Globulin, Total 2.9 1.5 - 4.5 g/dL   Albumin/Globulin Ratio 1.4 1.2 - 2.2   Bilirubin Total 0.5 0.0 - 1.2 mg/dL   Alkaline Phosphatase 93 44 - 121 IU/L   AST 17 0 - 40 IU/L   ALT 12 0 - 44  IU/L  CBC with Differential/Platelet  Result Value Ref Range   WBC 6.1 3.4 - 10.8 x10E3/uL   RBC 4.41 4.14 - 5.80 x10E6/uL   Hemoglobin 14.1 13.0 - 17.7 g/dL   Hematocrit 29.5 62.1 - 51.0 %   MCV 93 79 - 97 fL   MCH 32.0 26.6 - 33.0 pg   MCHC 34.3 31.5 - 35.7 g/dL   RDW 30.8 65.7 - 84.6 %   Platelets 184 150 - 450 x10E3/uL   Neutrophils 64 Not Estab. %   Lymphs 23 Not Estab. %   Monocytes 8 Not Estab. %   Eos 4 Not Estab. %   Basos 1 Not Estab. %   Neutrophils Absolute 4.0 1.4 - 7.0 x10E3/uL   Lymphocytes Absolute 1.4 0.7 - 3.1 x10E3/uL   Monocytes Absolute 0.5 0.1 - 0.9 x10E3/uL   EOS (ABSOLUTE) 0.2 0.0 - 0.4 x10E3/uL   Basophils Absolute 0.0 0.0 - 0.2 x10E3/uL   Immature Granulocytes 0 Not Estab. %   Immature Grans (Abs) 0.0 0.0 - 0.1 x10E3/uL  Lipid Panel w/o Chol/HDL Ratio  Result Value Ref Range   Cholesterol, Total 149 100 - 199 mg/dL   Triglycerides 83 0 - 149 mg/dL   HDL 69 >96 mg/dL   VLDL Cholesterol Cal 16 5 - 40 mg/dL   LDL Chol Calc (NIH) 64 0 - 99 mg/dL  TSH  Result Value Ref Range   TSH 3.170 0.450 - 4.500 uIU/mL  Urinalysis, Routine w reflex microscopic  Result Value Ref Range   Specific Gravity, UA 1.007 1.005 - 1.030   pH, UA 7.5 5.0 - 7.5   Color, UA Yellow Yellow   Appearance Ur Clear Clear   Leukocytes,UA Negative Negative   Protein,UA Negative Negative/Trace   Glucose, UA Negative Negative   Ketones, UA Negative Negative   RBC, UA Negative Negative   Bilirubin, UA Negative Negative   Urobilinogen, Ur 0.2 0.2 - 1.0 mg/dL   Nitrite, UA Negative Negative   Microscopic Examination Comment   Urine Microalbumin w/creat. ratio  Result Value Ref Range   Creatinine, Urine 33.0 Not Estab. mg/dL   Microalbumin, Urine 4.9 Not Estab. ug/mL   Microalb/Creat Ratio 15 0 - 29  mg/g creat  Specimen status report  Result Value Ref Range   specimen status report Comment       Assessment & Plan:   Problem List Items Addressed This Visit        Genitourinary   Benign hypertensive renal disease - Primary    BP doing great. No longer running low. Continue current regimen. Continue to monitor. Call with any concerns. Refills up to date- has plenty at home.       Other Visit Diagnoses     Needs flu shot       Flu shot given today.   Relevant Orders   Flu Vaccine Trivalent High Dose (Fluad)        Follow up plan: Return in about 5 months (around 01/07/2024).

## 2023-08-07 NOTE — Assessment & Plan Note (Signed)
BP doing great. No longer running low. Continue current regimen. Continue to monitor. Call with any concerns. Refills up to date- has plenty at home.

## 2023-09-19 ENCOUNTER — Ambulatory Visit: Payer: Medicare HMO | Admitting: Cardiology

## 2023-09-20 ENCOUNTER — Ambulatory Visit: Payer: Self-pay | Admitting: Cardiology

## 2023-10-03 DIAGNOSIS — Z79891 Long term (current) use of opiate analgesic: Secondary | ICD-10-CM | POA: Diagnosis not present

## 2023-10-03 DIAGNOSIS — F1120 Opioid dependence, uncomplicated: Secondary | ICD-10-CM | POA: Diagnosis not present

## 2023-10-04 ENCOUNTER — Ambulatory Visit: Payer: Medicare HMO | Admitting: Cardiology

## 2023-11-16 DIAGNOSIS — Z79891 Long term (current) use of opiate analgesic: Secondary | ICD-10-CM | POA: Diagnosis not present

## 2023-12-02 ENCOUNTER — Other Ambulatory Visit: Payer: Self-pay | Admitting: Family Medicine

## 2023-12-02 DIAGNOSIS — I2510 Atherosclerotic heart disease of native coronary artery without angina pectoris: Secondary | ICD-10-CM

## 2023-12-03 NOTE — Telephone Encounter (Signed)
Requested Prescriptions  Pending Prescriptions Disp Refills   metoprolol succinate (TOPROL-XL) 25 MG 24 hr tablet [Pharmacy Med Name: Metoprolol Succinate ER 25 MG Oral Tablet Extended Release 24 Hour] 90 tablet 0    Sig: Take 1 tablet by mouth once daily     Cardiovascular:  Beta Blockers Passed - 12/03/2023  8:43 AM      Passed - Last BP in normal range    BP Readings from Last 1 Encounters:  08/07/23 128/80         Passed - Last Heart Rate in normal range    Pulse Readings from Last 1 Encounters:  08/07/23 65         Passed - Valid encounter within last 6 months    Recent Outpatient Visits           3 months ago Benign hypertensive renal disease   [facility] [place], Megan P, DO   5 months ago Aortic atherosclerosis [facility]   [place] [facility] [place], Megan P, DO   11 months ago Routine general medical examination at a health care facility   [facility] [place], [place] P, DO   1 year ago Hypertension with heart disease   [place] Dorcas Carrow, DO   1 year ago Routine general medical examination at a health care facility   [facility] [place], Oralia Rud, DO       Future Appointments             In 2 days Pat[place], Anabel Bene, MD [facility]   In 4 weeks [name], MD [facility]   In 1 month [place] Benes, Oralia Rud, DO [facility]

## 2023-12-05 ENCOUNTER — Ambulatory Visit: Payer: Medicare Other | Attending: Cardiology | Admitting: Cardiology

## 2023-12-05 ENCOUNTER — Ambulatory Visit: Payer: Medicare HMO | Admitting: Cardiology

## 2023-12-05 ENCOUNTER — Encounter: Payer: Self-pay | Admitting: Cardiology

## 2023-12-05 VITALS — BP 138/80 | HR 69 | Ht 68.0 in | Wt 119.6 lb

## 2023-12-05 DIAGNOSIS — I2510 Atherosclerotic heart disease of native coronary artery without angina pectoris: Secondary | ICD-10-CM | POA: Diagnosis not present

## 2023-12-05 DIAGNOSIS — I10 Essential (primary) hypertension: Secondary | ICD-10-CM | POA: Diagnosis not present

## 2023-12-05 DIAGNOSIS — I714 Abdominal aortic aneurysm, without rupture, unspecified: Secondary | ICD-10-CM | POA: Diagnosis not present

## 2023-12-05 NOTE — Progress Notes (Signed)
Cardiology Office Note:  .   Date:  12/05/2023  ID:  Brian [name], DOB 1933-02-23, MRN [id number] PCP: Dorcas Carrow, DO  [facility] Providers Cardiologist:  Truett Mainland, MD PCP: Dorcas Carrow, DO  Chief Complaint  Patient presents with   Coronary Artery Disease      History of Present Illness: .    Brian [name] is a 89 y.o. male with hypertension, hyperlipidemia, CAD (STEMI, PPCI to ostial and mid LAD 2015), AAA 3.4 cm (08/2021), nicotine dependence  Patient is here with his daughter Brian [name].  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  Does not do any regular walking.  In the summertime, he walks for 20 minutes also without any significant symptoms.  He reports occasional episodes of shortness of breath at rest lasting for couple minutes, that are usually associated with "anxiety", and perhaps getting a cigarette.  He smokes 3 cigarettes a day.  He is not feeling to quit.   Vitals:   12/05/23 1105  BP: 138/80  Pulse: 69  SpO2: 94%     ROS:  Review of Systems  Cardiovascular:  Negative for chest pain, dyspnea on exertion, leg swelling, palpitations and syncope.  Respiratory:  Positive for shortness of breath (Random, nonexertional, non limiting).      Studies Reviewed: Marland Kitchen        EKG 12/05/2023: Normal sinus rhythm with sinus arrhythmia Septal infarct , age undetermined Lateral infarct , age undetermined  new finding  When compared with ECG of 28-Oct-2016 23:24,     Independently interpreted 12/2022: Chol 149, TG 83, HDL 69, LDL 64 Hb 14.1 Cr 0.7   Physical Exam:   Physical Exam Vitals and nursing note reviewed.  Constitutional:      General: He is not in acute distress. Neck:     Vascular: No JVD.  Cardiovascular:     Rate and Rhythm: Normal rate and regular rhythm.     Heart sounds: Normal heart sounds. No murmur heard. Pulmonary:     Effort: Pulmonary effort is normal.     Breath sounds: Normal breath  sounds. No wheezing or rales.  Musculoskeletal:     Right lower leg: No edema.     Left lower leg: No edema.      VISIT DIAGNOSES:   ICD-10-CM   1. Coronary artery disease involving native coronary artery of native heart without angina pectoris  I25.10 EKG 12-Lead    ECHOCARDIOGRAM COMPLETE    2. Abdominal aortic aneurysm (AAA) without rupture, unspecified part (HCC)  I71.40 VAS [place] AAA DUPLEX    3. Primary hypertension  I10        ASSESSMENT AND PLAN: .    Brian [name] is a 89 y.o. male with hypertension, hyperlipidemia, CAD (STEMI, PPCI to ostial and mid LAD 2015), AAA 3.4 cm (08/2021), nicotine dependence   CAD: Stable with no angina symptoms.  His EKG today shows lateral Q waves without any complaint of chest pain. I will obtain echocardiogram.  If there are new wall motion abnormalities to corroborate with the EKG finding, I will then consider doing a stress test. He has been on plavix without Aspirin with no bleeding issues. Continue the same, along with Lipitor 10, lisinopril 5 mg. Continue metoprolol succinate 25 mg,    AAA:  3.4 cm in 08/2021. Repeat aorta duplex now.    Hypertension: Controlled.  Nicotine dependence: Smokes 3 cigarettes a day.  He is not ready to  quit.      F/u in 1 year  Signed, Elder Negus, MD

## 2023-12-05 NOTE — Patient Instructions (Addendum)
Medication Instructions:  Your physician recommends that you continue on your current medications as directed. Please refer to the Current Medication list given to you today.  *If you need a refill on your cardiac medications before your next appointment, please call your pharmacy*   Testing/Procedures: Your physician has requested that you have an echocardiogram. Echocardiography is a painless test that uses sound waves to create images of your heart. It provides your doctor with information about the size and shape of your heart and how well your heart's chambers and valves are working. This procedure takes approximately one hour. There are no restrictions for this procedure. Please do NOT wear cologne, perfume, aftershave, or lotions (deodorant is allowed). Please arrive 15 minutes prior to your appointment time.   PLEASE SCHEDULE ECHO AND AAA DUPLEX ON SAME DAY AT [facility] LOCATION ONLY  Please note: We ask at that you not bring children with you during ultrasound (echo/ vascular) testing. Due to room size and safety concerns, children are not allowed in the ultrasound rooms during exams. Our front office staff cannot provide observation of children in our lobby area while testing is being conducted. An adult accompanying a patient to their appointment will only be allowed in the ultrasound room at the discretion of the ultrasound technician under special circumstances. We apologize for any inconvenience. PLEASE SCHEDULE ECHO AND AAA DUPLEX ON SAME DAY  AT [facility] LOCATION ONLY    Your physician has requested that you have an abdominal aorta duplex. During this test, an ultrasound is used to evaluate the aorta. Allow 30 minutes for this exam. Do not eat after midnight the day before and avoid carbonated beverages.  PLEASE SCHEDULE ECHO AND AAA DUPLEX ON SAME DAY AT [facility] OFFICE ONLY  Please note: We ask at that you not bring children with you during ultrasound (echo/ vascular) testing.  Due to room size and safety concerns, children are not allowed in the ultrasound rooms during exams. Our front office staff cannot provide observation of children in our lobby area while testing is being conducted. An adult accompanying a patient to their appointment will only be allowed in the ultrasound room at the discretion of the ultrasound technician under special circumstances. We apologize for any inconvenience.    Follow-Up: At [facility], you and your health needs are our priority.  As part of our continuing mission to provide you with exceptional heart care, we have created designated Provider Care Teams.  These Care Teams include your primary Cardiologist (physician) and Advanced Practice Providers (APPs -  Physician Assistants and Nurse Practitioners) who all work together to provide you with the care you need, when you need it.  We recommend signing up for the patient portal called "MyChart".  Sign up information is provided on this After Visit Summary.  MyChart is used to connect with patients for Virtual Visits (Telemedicine).  Patients are able to view lab/test results, encounter notes, upcoming appointments, etc.  Non-urgent messages can be sent to your provider as well.   To learn more about what you can do with MyChart, go to [email or web address].    Your next appointment:   1 year(s)  Provider:   Truett Mainland, MD   Other Instructions   1st Floor: - Lobby - Registration  - Pharmacy  - Lab - Cafe  2nd Floor: - PV Lab - Diagnostic Testing (echo, CT, nuclear med)  3rd Floor: - Vacant  4th Floor: - TCTS (cardiothoracic surgery) - AFib Clinic - Structural Heart Clinic -  Vascular Surgery  - Vascular Ultrasound  5th Floor: - [facility] Cardiology (general and EP) - Clinical Pharmacy for coumadin, hypertension, lipid, weight-loss medications, and med management appointments    Valet parking services will be available as well.

## 2023-12-24 DIAGNOSIS — M25511 Pain in right shoulder: Secondary | ICD-10-CM | POA: Diagnosis not present

## 2023-12-24 DIAGNOSIS — Z720 Tobacco use: Secondary | ICD-10-CM | POA: Diagnosis not present

## 2023-12-24 DIAGNOSIS — F17210 Nicotine dependence, cigarettes, uncomplicated: Secondary | ICD-10-CM | POA: Diagnosis not present

## 2023-12-24 DIAGNOSIS — Z79891 Long term (current) use of opiate analgesic: Secondary | ICD-10-CM | POA: Diagnosis not present

## 2023-12-24 DIAGNOSIS — M545 Low back pain, unspecified: Secondary | ICD-10-CM | POA: Diagnosis not present

## 2023-12-31 ENCOUNTER — Encounter: Payer: Self-pay | Admitting: Dermatology

## 2023-12-31 ENCOUNTER — Ambulatory Visit: Payer: Medicare Other | Admitting: Dermatology

## 2023-12-31 DIAGNOSIS — Z85828 Personal history of other malignant neoplasm of skin: Secondary | ICD-10-CM | POA: Diagnosis not present

## 2023-12-31 DIAGNOSIS — W908XXA Exposure to other nonionizing radiation, initial encounter: Secondary | ICD-10-CM

## 2023-12-31 DIAGNOSIS — D1801 Hemangioma of skin and subcutaneous tissue: Secondary | ICD-10-CM

## 2023-12-31 DIAGNOSIS — Z86006 Personal history of melanoma in-situ: Secondary | ICD-10-CM | POA: Diagnosis not present

## 2023-12-31 DIAGNOSIS — L570 Actinic keratosis: Secondary | ICD-10-CM | POA: Diagnosis not present

## 2023-12-31 DIAGNOSIS — D229 Melanocytic nevi, unspecified: Secondary | ICD-10-CM

## 2023-12-31 DIAGNOSIS — L821 Other seborrheic keratosis: Secondary | ICD-10-CM | POA: Diagnosis not present

## 2023-12-31 DIAGNOSIS — Z1283 Encounter for screening for malignant neoplasm of skin: Secondary | ICD-10-CM

## 2023-12-31 DIAGNOSIS — L578 Other skin changes due to chronic exposure to nonionizing radiation: Secondary | ICD-10-CM

## 2023-12-31 DIAGNOSIS — L814 Other melanin hyperpigmentation: Secondary | ICD-10-CM

## 2023-12-31 NOTE — Patient Instructions (Addendum)

## 2023-12-31 NOTE — Progress Notes (Signed)
 Follow-Up Visit   Subjective  Brian [name] is a 89 y.o. male who presents for the following: Skin Cancer Screening and Full Body Skin Exam  The patient presents for Total-Body Skin Exam (TBSE) for skin cancer screening and mole check. The patient has spots, moles and lesions to be evaluated, some may be new or changing and the patient may have concern these could be cancer.    The following portions of the chart were reviewed this encounter and updated as appropriate: medications, allergies, medical history  Review of Systems:  No other skin or systemic complaints except as noted in HPI or Assessment and Plan.  Objective  Well appearing patient in no apparent distress; mood and affect are within normal limits.  A full examination was performed including scalp, head, eyes, ears, nose, lips, neck, chest, axillae, abdomen, back, buttocks, bilateral upper extremities, bilateral lower extremities, hands, feet, fingers, toes, fingernails, and toenails. All findings within normal limits unless otherwise noted below.   Relevant physical exam findings are noted in the Assessment and Plan.  L forearm x1, L temple x1, central forehead x1, R antihelix x1 (4) Pink scaly macules  Assessment & Plan   SKIN CANCER SCREENING PERFORMED TODAY.  ACTINIC DAMAGE - Chronic condition, secondary to cumulative UV/sun exposure - diffuse scaly erythematous macules with underlying dyspigmentation - Recommend daily broad spectrum sunscreen SPF 30+ to sun-exposed areas, reapply every 2 hours as needed.  - Staying in the shade or wearing long sleeves, sun glasses (UVA+UVB protection) and wide brim hats (4-inch brim around the entire circumference of the hat) are also recommended for sun protection.  - Call for new or changing lesions.  LENTIGINES, SEBORRHEIC KERATOSES, HEMANGIOMAS - Benign normal skin lesions - Benign-appearing - Call for any changes  MELANOCYTIC NEVI - Tan-brown and/or  pink-flesh-colored symmetric macules and papules - Benign appearing on exam today - Observation - Call clinic for new or changing moles - Recommend daily use of broad spectrum spf 30+ sunscreen to sun-exposed areas.    HISTORY OF BASAL CELL CARCINOMA OF THE SKIN - No evidence of recurrence today - Recommend regular full body skin exams - Recommend daily broad spectrum sunscreen SPF 30+ to sun-exposed areas, reapply every 2 hours as needed.  - Call if any new or changing lesions are noted between office visits   HISTORY OF SQUAMOUS CELL CARCINOMA OF THE SKIN - No evidence of recurrence today - No lymphadenopathy - Recommend regular full body skin exams - Recommend daily broad spectrum sunscreen SPF 30+ to sun-exposed areas, reapply every 2 hours as needed.  - Call if any new or changing lesions are noted between office visits    HISTORY OF MELANOMA IN SITU - No evidence of recurrence today - Recommend regular full body skin exams - Recommend daily broad spectrum sunscreen SPF 30+ to sun-exposed areas, reapply every 2 hours as needed.  - Call if any new or changing lesions are noted between office visits     AK (ACTINIC KERATOSIS) (4) L forearm x1, L temple x1, central forehead x1, R antihelix x1 (4) Actinic keratoses are precancerous spots that appear secondary to cumulative UV radiation exposure/sun exposure over time. They are chronic with expected duration over 1 year. A portion of actinic keratoses will progress to squamous cell carcinoma of the skin. It is not possible to reliably predict which spots will progress to skin cancer and so treatment is recommended to prevent development of skin cancer.  Recommend daily broad spectrum sunscreen  SPF 30+ to sun-exposed areas, reapply every 2 hours as needed.  Recommend staying in the shade or wearing long sleeves, sun glasses (UVA+UVB protection) and wide brim hats (4-inch brim around the entire circumference of the hat). Call for new  or changing lesions. Destruction of lesion - L forearm x1, L temple x1, central forehead x1, R antihelix x1 (4) Complexity: simple   Destruction method: cryotherapy   Informed consent: discussed and consent obtained   Timeout:  patient name, date of birth, surgical site, and procedure verified Lesion destroyed using liquid nitrogen: Yes   Region frozen until ice ball extended beyond lesion: Yes   Cryo cycles: 1 or 2. Outcome: patient tolerated procedure well with no complications   Post-procedure details: wound care instructions given   MULTIPLE BENIGN NEVI   LENTIGINES   ACTINIC ELASTOSIS   SEBORRHEIC KERATOSES   CHERRY ANGIOMA   Return in about 6 months (around 06/29/2024) for Melanoma IS, Hx SCC, Hx BCC, w/ Dr. Katrinka Blazing.  Wynonia Lawman, CMA, am acting as scribe for [name], MD .   Documentation: I have reviewed the above documentation for accuracy and completeness, and I agree with the above.  [name], MD

## 2024-01-08 ENCOUNTER — Encounter: Payer: Self-pay | Admitting: Family Medicine

## 2024-01-08 ENCOUNTER — Ambulatory Visit (INDEPENDENT_AMBULATORY_CARE_PROVIDER_SITE_OTHER): Payer: Medicare Other | Admitting: Family Medicine

## 2024-01-08 VITALS — BP 116/70 | HR 82 | Temp 98.4°F | Resp 15 | Ht 67.99 in | Wt 124.4 lb

## 2024-01-08 DIAGNOSIS — I119 Hypertensive heart disease without heart failure: Secondary | ICD-10-CM

## 2024-01-08 DIAGNOSIS — I2510 Atherosclerotic heart disease of native coronary artery without angina pectoris: Secondary | ICD-10-CM

## 2024-01-08 DIAGNOSIS — E782 Mixed hyperlipidemia: Secondary | ICD-10-CM | POA: Diagnosis not present

## 2024-01-08 DIAGNOSIS — Z0000 Encounter for general adult medical examination without abnormal findings: Secondary | ICD-10-CM | POA: Diagnosis not present

## 2024-01-08 DIAGNOSIS — I129 Hypertensive chronic kidney disease with stage 1 through stage 4 chronic kidney disease, or unspecified chronic kidney disease: Secondary | ICD-10-CM | POA: Diagnosis not present

## 2024-01-08 DIAGNOSIS — R1031 Right lower quadrant pain: Secondary | ICD-10-CM | POA: Diagnosis not present

## 2024-01-08 DIAGNOSIS — I700 Atherosclerosis of aorta: Secondary | ICD-10-CM | POA: Diagnosis not present

## 2024-01-08 LAB — MICROALBUMIN, URINE WAIVED
Creatinine, Urine Waived: 50 mg/dL (ref 10–300)
Microalb, Ur Waived: 30 mg/L — ABNORMAL HIGH (ref 0–19)

## 2024-01-08 MED ORDER — LOSARTAN POTASSIUM 25 MG PO TABS: 12.5000 mg | ORAL_TABLET | Freq: Every day | ORAL | 1 refills | Status: DC

## 2024-01-08 MED ORDER — ATORVASTATIN CALCIUM 80 MG PO TABS: ORAL_TABLET | ORAL | 1 refills | Status: DC

## 2024-01-08 MED ORDER — METOPROLOL SUCCINATE ER 25 MG PO TB24: 25.0000 mg | ORAL_TABLET | Freq: Every day | ORAL | 1 refills | Status: DC

## 2024-01-08 NOTE — Assessment & Plan Note (Signed)
 Will keep BP and cholesterol under good control. Continue to monitor. Call with any concerns.

## 2024-01-08 NOTE — Assessment & Plan Note (Signed)
 Under good control on current regimen. Continue current regimen. Continue to monitor. Call with any concerns. Refills given. Labs drawn today.

## 2024-01-08 NOTE — Progress Notes (Signed)
 BP 116/70 (BP Location: Left Arm, Patient Position: Sitting, Cuff Size: Normal)   Pulse 82   Temp 98.4 F (36.9 C) (Oral)   Resp 15   Ht 5' 7.99" (1.727 m)   Wt 124 lb 6.4 oz (56.4 kg)   SpO2 94%   BMI 18.92 kg/m    Subjective:    Patient ID: Brian [name], male    DOB: 05-29-1933, 89 y.o.   MRN: [id number]  HPI: Brian [name] is a 89 y.o. male presenting on 01/08/2024 for comprehensive medical examination. Current medical complaints include:  Had testicular pain on Sunday, Had hernia pain on Monday after pulling heavy garbage cans. Feeling better now.   HYPERTENSION / HYPERLIPIDEMIA Satisfied with current treatment? yes Duration of hypertension: chronic BP monitoring frequency: not checking BP medication side effects: no Past BP meds: metoprolol, losartan Duration of hyperlipidemia: chronic Cholesterol medication side effects: no Cholesterol supplements: none Past cholesterol medications: atorvastatin Medication compliance: excellent compliance Aspirin: yes Recent stressors: no Recurrent headaches: no Visual changes: no Palpitations: no Dyspnea: no Chest pain: no Lower extremity edema: no Dizzy/lightheaded: no  Interim Problems from his last visit: no  Depression Screen done today and results listed below:     01/08/2024    8:51 AM 08/07/2023    9:10 AM 07/03/2023    2:33 PM 06/20/2023    8:45 AM 12/20/2022    8:54 AM  Depression screen PHQ 2/9  Decreased Interest 0 0 0 0 0  Down, Depressed, Hopeless 0 0 1 1 0  PHQ - 2 Score 0 0 1 1 0  Altered sleeping 0 0 0 0 0  Tired, decreased energy 0 0 0 0 0  Change in appetite 0 1 0 2 1  Feeling bad or failure about yourself  0 0 0 0 1  Trouble concentrating 0 0 0 0 0  Moving slowly or fidgety/restless 0 0 0 0 0  Suicidal thoughts  0 0 0 0  PHQ-9 Score 0 1 1 3 2   Difficult doing work/chores Not difficult at all Not difficult at all Not difficult at all Not difficult at all Not difficult at all    Past Medical  History:  Past Medical History:  Diagnosis Date   AAA (abdominal aortic aneurysm) (HCC)    2.9 CM infrarenal AAA on CT 11/2011   Acute anterior wall MI (HCC) 05/29/2014   Alcoholism (HCC)    Aneurysm of abdominal aorta (HCC)    SMALL   Anxiety    panic attacks, rare.   Aortic aneurysm (HCC)    small   Arthritis    Basal cell carcinoma 06/21/2022   Right helix. 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    Gastritis 03/2012   GERD (gastroesophageal reflux disease)    GI bleed    HFrEF (heart failure with reduced ejection fraction) (HCC)    HOH (hard of hearing)    Hyperlipemia    Hypertension    Hypovolemic shock (HCC)    Kidney stones    Melanoma in situ (HCC) 06/21/2022   Right preauricular, Mohs 07/19/22   Myocardial infarction (HCC) 2015   Panic attacks    Positive H. pylori titer ?, before 2010   treated with Abx.    Squamous cell carcinoma of skin 12/28/2021   SCC IS, R  medial calf superior, EDC 01/23/22   Squamous cell carcinoma of skin 12/28/2021   SCC IS, R medial calf inferior, EDC 01/23/22   Tubular adenoma of colon     Surgical History:  Past Surgical History:  Procedure Laterality Date   CARDIAC CATHETERIZATION  2015   Stents   CATARACT EXTRACTION W/PHACO Right 09/21/2016   Procedure: CATARACT EXTRACTION PHACO AND INTRAOCULAR LENS PLACEMENT (IOC);  Surgeon: Nevada Crane, MD;  Location: [facility] ORS;  Service: Ophthalmology;  Laterality: Right;  [place] 1.10 AP% 13.8 CDE 9.93 Fluid pack lot # [id number]   CATARACT EXTRACTION W/PHACO Left 12/07/2016   Procedure: CATARACT EXTRACTION PHACO AND INTRAOCULAR LENS PLACEMENT (IOC);  Surgeon: Nevada Crane, MD;  Location: [facility] ORS;  Service: Ophthalmology;  Laterality: Left;  [place] 00:42.7 ap 13.7 cde 5.85 lot #[id number]   COLONOSCOPY WITH PROPOFOL Left 06/06/2015   Procedure: COLONOSCOPY  WITH PROPOFOL;  Surgeon: Wallace Cullens, MD;  Location: [facility];  Service: Endoscopy;  Laterality: Left;   CORONARY ANGIOPLASTY     2015   LEFT HEART CATHETERIZATION WITH CORONARY ANGIOGRAM Bilateral 05/29/2014   Procedure: LEFT HEART CATHETERIZATION WITH CORONARY ANGIOGRAM;  Surgeon: Pamella Pert, MD;  Location: [facility] C[facility] LAB;  Service: Cardiovascular;  Laterality: Bilateral;   PERCUTANEOUS CORONARY STENT INTERVENTION (PCI-S)  05/29/2014   Procedure: PERCUTANEOUS CORONARY STENT INTERVENTION (PCI-S);  Surgeon: Pamella Pert, MD;  Location: [facility] CATH LAB;  Service: Cardiovascular;;  DES x2 Prox and Mid LAD     Medications:  Current Outpatient Medications on File Prior to Visit  Medication Sig   aspirin EC 81 MG tablet Take 1 tablet (81 mg total) by mouth daily. Swallow whole.   diazepam (VALIUM) 5 MG tablet Take 5 mg by mouth every 8 (eight) hours as needed (for anxiety).  (Patient not taking: Reported on 01/08/2024)   mupirocin ointment (BACTROBAN) 2 % Apply 1 Application topically daily. (Patient not taking: Reported on 01/08/2024)   nitroGLYCERIN (NITROSTAT) 0.4 MG SL tablet PLACE ONE TABLET UNDER THE TONGUE EVERY 5 MINUTES AS NEEDED FOR CHEST PAIN (Patient not taking: Reported on 01/08/2024)   traMADol (ULTRAM) 50 MG tablet Take 1 tablet (50 mg total) by mouth every 4 (four) hours as needed. (Patient not taking: Reported on 01/08/2024)   No current facility-administered medications on file prior to visit.    Allergies:  Allergies  Allergen Reactions   Zoloft [Sertraline Hcl] Nausea Only    Patient states that he was not himself when he took this drug    Social History:  Social History   Socioeconomic History   Marital status: Married    Spouse name: Aurea Graff   Number of children: 4   Years of education: Not on file   Highest education level: 12th grade  Occupational History   Occupation: semi retired  Tobacco Use   Smoking status: Former    Current packs/day: 0.00     Average packs/day: 0.3 packs/day for 56.7 years (14.2 ttl pk-yrs)    Types: Cigarettes, Cigars    Start date: 06/1966    Quit date: 03/2023    Years since quitting: 0.8   Smokeless tobacco: Never   Tobacco comments:    4 cigarettes per day, restarted and quit ~03/2023  Vaping Use   Vaping status: Never Used  Substance and Sexual Activity   Alcohol use: No    Alcohol/week: 0.0 standard drinks of alcohol   Drug use: No   Sexual activity: Not Currently  Other Topics Concern  Not on file  Social History Narrative   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    Social Drivers of Health   Financial Resource Strain: Low Risk  (08/06/2023)   Overall Financial Resource Strain (CARDIA)    Difficulty of Paying Living Expenses: Not hard at all  Food Insecurity: No Food Insecurity (08/06/2023)   Hunger Vital Sign    Worried About Running Out of Food in the Last Year: Never true    Ran Out of Food in the Last Year: Never true  Transportation Needs: No Transportation Needs (08/06/2023)   PRAPARE - Administrator, Civil Service (Medical): No    Lack of Transportation (Non-Medical): No  Physical Activity: Insufficiently Active (08/06/2023)   Exercise Vital Sign    Days of Exercise per Week: 3 days    Minutes of Exercise per Session: 20 min  Stress: No Stress Concern Present (08/06/2023)   [employer] of Occupational Health - Occupational Stress Questionnaire    Feeling of Stress : Only a little  Social Connections: Moderately Integrated (08/06/2023)   Social Connection and Isolation Panel [NHANES]    Frequency of Communication with Friends and Family: More than three times a week    Frequency of Social Gatherings with Friends and Family: Twice a week    Attends Religious Services: More than 4 times per year    Active Member of [employer] or Organizations: No    Attends Banker Meetings: Never    Marital Status: Married  Catering manager Violence: Not At Risk  (07/03/2023)   Humiliation, Afraid, Rape, and Kick questionnaire    Fear of Current or Ex-Partner: No    Emotionally Abused: No    Physically Abused: No    Sexually Abused: No   Social History   Tobacco Use  Smoking Status Former   Current packs/day: 0.00   Average packs/day: 0.3 packs/day for 56.7 years (14.2 ttl pk-yrs)   Types: Cigarettes, Cigars   Start date: 06/1966   Quit date: 03/2023   Years since quitting: 0.8  Smokeless Tobacco Never  Tobacco Comments   4 cigarettes per day, restarted and quit ~03/2023   Social History   Substance and Sexual Activity  Alcohol Use No   Alcohol/week: 0.0 standard drinks of alcohol    Family History:  Family History  Problem Relation Age of Onset   Heart disease Mother    Ulcers Father        stomach    Heart disease Father    Anuerysm Brother    Leukemia Brother    Heart disease Paternal Grandmother    Heart attack Paternal Grandmother    Heart disease Paternal Grandfather    Hypertension Daughter    Colon cancer Neg Hx     Past medical history, surgical history, medications, allergies, family history and social history reviewed with patient today and changes made to appropriate areas of the chart.   Review of Systems  Constitutional: Negative.   HENT: Negative.    Eyes: Negative.   Respiratory:  Positive for shortness of breath. Negative for cough, hemoptysis, sputum production and wheezing.   Cardiovascular: Negative.   Gastrointestinal:  Positive for nausea. Negative for abdominal pain, blood in stool, constipation, diarrhea, heartburn, melena and vomiting.  Genitourinary: Negative.   Musculoskeletal: Negative.   Skin: Negative.   Neurological:  Positive for dizziness. Negative for tingling, tremors, sensory change, speech change, focal weakness, seizures, loss of consciousness, weakness and headaches.  Endo/Heme/Allergies:  Negative for environmental allergies and polydipsia. Bruises/bleeds easily.   Psychiatric/Behavioral: Negative.     All other ROS negative except what is listed above and in the HPI.      Objective:    BP 116/70 (BP Location: Left Arm, Patient Position: Sitting, Cuff Size: Normal)   Pulse 82   Temp 98.4 F (36.9 C) (Oral)   Resp 15   Ht 5' 7.99" (1.727 m)   Wt 124 lb 6.4 oz (56.4 kg)   SpO2 94%   BMI 18.92 kg/m   Wt Readings from Last 3 Encounters:  01/08/24 124 lb 6.4 oz (56.4 kg)  12/05/23 119 lb 9.6 oz (54.3 kg)  08/07/23 125 lb 9.6 oz (57 kg)    Physical Exam Vitals and nursing note reviewed. Exam conducted with a chaperone present.  Constitutional:      General: He is not in acute distress.    Appearance: Normal appearance. He is obese. He is not ill-appearing, toxic-appearing or diaphoretic.  HENT:     Head: Normocephalic and atraumatic.     Right Ear: Tympanic membrane, ear canal and external ear normal. There is no impacted cerumen.     Left Ear: Tympanic membrane, ear canal and external ear normal. There is no impacted cerumen.     Nose: Nose normal. No congestion or rhinorrhea.     Mouth/Throat:     Mouth: Mucous membranes are moist.     Pharynx: Oropharynx is clear. No oropharyngeal exudate or posterior oropharyngeal erythema.  Eyes:     General: No scleral icterus.       Right eye: No discharge.        Left eye: No discharge.     Extraocular Movements: Extraocular movements intact.     Conjunctiva/sclera: Conjunctivae normal.     Pupils: Pupils are equal, round, and reactive to light.  Neck:     Vascular: No carotid bruit.  Cardiovascular:     Rate and Rhythm: Normal rate and regular rhythm.     Pulses: Normal pulses.     Heart sounds: No murmur heard.    No friction rub. No gallop.  Pulmonary:     Effort: Pulmonary effort is normal. No respiratory distress.     Breath sounds: Normal breath sounds. No stridor. No wheezing, rhonchi or rales.  Chest:     Chest wall: No tenderness.  Abdominal:     General: Abdomen is flat.  Bowel sounds are normal. There is no distension.     Palpations: Abdomen is soft. There is no mass.     Tenderness: There is no abdominal tenderness. There is no right CVA tenderness, left CVA tenderness, guarding or rebound.     Hernia: No hernia is present. There is no hernia in the left inguinal area or right inguinal area.  Genitourinary:    Penis: Normal and circumcised.      Testes: Normal.        Right: Mass, tenderness, swelling, testicular hydrocele or varicocele not present. Right testis is descended. Cremasteric reflex is present.         Left: Mass, tenderness, swelling, testicular hydrocele or varicocele not present. Left testis is descended. Cremasteric reflex is present.      Epididymis:     Right: Normal.     Left: Normal.       Comments: Mild bulge on coughing Musculoskeletal:        General: No swelling, tenderness, deformity or signs of injury.     Cervical  back: Normal range of motion and neck supple. No rigidity. No muscular tenderness.     Right lower leg: No edema.     Left lower leg: No edema.  Lymphadenopathy:     Cervical: No cervical adenopathy.  Skin:    General: Skin is warm and dry.     Capillary Refill: Capillary refill takes less than 2 seconds.     Coloration: Skin is not jaundiced or pale.     Findings: No bruising, erythema, lesion or rash.  Neurological:     General: No focal deficit present.     Mental Status: He is alert and oriented to person, place, and time.     Cranial Nerves: No cranial nerve deficit.     Sensory: No sensory deficit.     Motor: No weakness.     Coordination: Coordination normal.     Gait: Gait normal.     Deep Tendon Reflexes: Reflexes normal.  Psychiatric:        Mood and Affect: Mood normal.        Behavior: Behavior normal.        Thought Content: Thought content normal.        Judgment: Judgment normal.     Results for orders placed or performed in visit on 01/08/24  Microalbumin, Urine Waived   Collection  Time: 01/08/24  8:58 AM  Result Value Ref Range   Microalb, Ur Waived 30 (H) 0 - 19 mg/L   Creatinine, Urine Waived 50 10 - 300 mg/dL   Microalb/Creat Ratio 30-300 (H) <30 mg/g      Assessment & Plan:   Problem List Items Addressed This Visit       Cardiovascular and Mediastinum   CAD (coronary artery disease)   Will keep BP and cholesterol under good control. Continue to monitor. Call with any concerns.       Relevant Medications   atorvastatin (LIPITOR) 80 MG tablet   losartan (COZAAR) 25 MG tablet   metoprolol succinate (TOPROL-XL) 25 MG 24 hr tablet   Aortic atherosclerosis (HCC)   Will keep BP and cholesterol under good control. Continue to monitor. Call with any concerns.       Relevant Medications   atorvastatin (LIPITOR) 80 MG tablet   losartan (COZAAR) 25 MG tablet   metoprolol succinate (TOPROL-XL) 25 MG 24 hr tablet   Other Relevant Orders   Comprehensive metabolic panel   Hypertension with heart disease   Relevant Medications   atorvastatin (LIPITOR) 80 MG tablet   losartan (COZAAR) 25 MG tablet   metoprolol succinate (TOPROL-XL) 25 MG 24 hr tablet     Genitourinary   Benign hypertensive renal disease   Under good control on current regimen. Continue current regimen. Continue to monitor. Call with any concerns. Refills given. Labs drawn today.        Relevant Orders   Comprehensive metabolic panel   CBC with Differential/Platelet   Lipid Panel w/o Chol/HDL Ratio   TSH   Microalbumin, Urine Waived (Completed)     Other   Mixed hyperlipidemia   Under good control on current regimen. Continue current regimen. Continue to monitor. Call with any concerns. Refills given. Labs drawn today.       Relevant Medications   atorvastatin (LIPITOR) 80 MG tablet   losartan (COZAAR) 25 MG tablet   metoprolol succinate (TOPROL-XL) 25 MG 24 hr tablet   Other Relevant Orders   Comprehensive metabolic panel   CBC with Differential/Platelet   Lipid Panel w/o  Chol/HDL Ratio   Other Visit Diagnoses       Routine general medical examination at a health care facility    -  Primary   Vaccines up to date. Screening labs checked today. Continue diet and exercise. Call with any concerns.     Right lower quadrant abdominal pain       Pain resolved. Likely musculoskeletal. Call if comes back.        LABORATORY TESTING:  Health maintenance labs ordered today as discussed above.   IMMUNIZATIONS:   - Tdap: Tetanus vaccination status reviewed: last tetanus booster within 10 years. - Influenza: Up to date - Pneumovax: Up to date - Prevnar: Up to date - COVID: Refused - HPV: Not applicable - Shingrix vaccine: Refused  SCREENING: - Colonoscopy: Not applicable  Discussed with patient purpose of the colonoscopy is to detect colon cancer at curable precancerous or early stages   PATIENT COUNSELING:    Sexuality: Discussed sexually transmitted diseases, partner selection, use of condoms, avoidance of unintended pregnancy  and contraceptive alternatives.   Advised to avoid cigarette smoking.  I discussed with the patient that most people either abstain from alcohol or drink within safe limits (<=14/week and <=4 drinks/occasion for males, <=7/weeks and <= 3 drinks/occasion for females) and that the risk for alcohol disorders and other health effects rises proportionally with the number of drinks per week and how often a drinker exceeds daily limits.  Discussed cessation/primary prevention of drug use and availability of treatment for abuse.   Diet: Encouraged to adjust caloric intake to maintain  or achieve ideal body weight, to reduce intake of dietary saturated fat and total fat, to limit sodium intake by avoiding high sodium foods and not adding table salt, and to maintain adequate dietary potassium and calcium preferably from fresh fruits, vegetables, and low-fat dairy products.    stressed the importance of regular exercise  Injury prevention:  Discussed safety belts, safety helmets, smoke detector, smoking near bedding or upholstery.   Dental health: Discussed importance of regular tooth brushing, flossing, and dental visits.   Follow up plan: NEXT PREVENTATIVE PHYSICAL DUE IN 1 YEAR. Return in about 6 months (around 07/07/2024).

## 2024-01-08 NOTE — Assessment & Plan Note (Addendum)
 Will keep BP and cholesterol under good control. Continue to monitor. Call with any concerns.

## 2024-01-09 LAB — CBC WITH DIFFERENTIAL/PLATELET
Basophils Absolute: 0 10*3/uL (ref 0.0–0.2)
Basos: 1 %
EOS (ABSOLUTE): 0.2 10*3/uL (ref 0.0–0.4)
Eos: 4 %
Hematocrit: 42.1 % (ref 37.5–51.0)
Hemoglobin: 14.4 g/dL (ref 13.0–17.7)
Immature Grans (Abs): 0 10*3/uL (ref 0.0–0.1)
Immature Granulocytes: 0 %
Lymphocytes Absolute: 1.8 10*3/uL (ref 0.7–3.1)
Lymphs: 31 %
MCH: 33 pg (ref 26.6–33.0)
MCHC: 34.2 g/dL (ref 31.5–35.7)
MCV: 96 fL (ref 79–97)
Monocytes Absolute: 0.6 10*3/uL (ref 0.1–0.9)
Monocytes: 10 %
Neutrophils Absolute: 3.2 10*3/uL (ref 1.4–7.0)
Neutrophils: 54 %
Platelets: 139 10*3/uL — ABNORMAL LOW (ref 150–450)
RBC: 4.37 x10E6/uL (ref 4.14–5.80)
RDW: 11.8 % (ref 11.6–15.4)
WBC: 5.9 10*3/uL (ref 3.4–10.8)

## 2024-01-09 LAB — COMPREHENSIVE METABOLIC PANEL
ALT: 17 IU/L (ref 0–44)
AST: 26 IU/L (ref 0–40)
Albumin: 4.3 g/dL (ref 3.7–4.7)
Alkaline Phosphatase: 85 IU/L (ref 44–121)
BUN/Creatinine Ratio: 13 (ref 10–24)
BUN: 10 mg/dL (ref 8–27)
Bilirubin Total: 0.5 mg/dL (ref 0.0–1.2)
CO2: 25 mmol/L (ref 20–29)
Calcium: 9.1 mg/dL (ref 8.6–10.2)
Chloride: 101 mmol/L (ref 96–106)
Creatinine, Ser: 0.76 mg/dL (ref 0.76–1.27)
Globulin, Total: 2.5 g/dL (ref 1.5–4.5)
Glucose: 98 mg/dL (ref 70–99)
Potassium: 4 mmol/L (ref 3.5–5.2)
Sodium: 141 mmol/L (ref 134–144)
Total Protein: 6.8 g/dL (ref 6.0–8.5)
eGFR: 86 mL/min/{1.73_m2} (ref >59)

## 2024-01-09 LAB — TSH: TSH: 7.54 u[IU]/mL — ABNORMAL HIGH (ref 0.450–4.500)

## 2024-01-09 LAB — LIPID PANEL W/O CHOL/HDL RATIO
Cholesterol, Total: 142 mg/dL (ref 100–199)
HDL: 70 mg/dL (ref >39)
LDL Chol Calc (NIH): 59 mg/dL (ref 0–99)
Triglycerides: 62 mg/dL (ref 0–149)
VLDL Cholesterol Cal: 13 mg/dL (ref 5–40)

## 2024-01-14 ENCOUNTER — Encounter: Payer: Self-pay | Admitting: Family Medicine

## 2024-01-14 ENCOUNTER — Other Ambulatory Visit: Payer: Self-pay | Admitting: Family Medicine

## 2024-01-14 DIAGNOSIS — Z79891 Long term (current) use of opiate analgesic: Secondary | ICD-10-CM | POA: Diagnosis not present

## 2024-01-14 DIAGNOSIS — R7989 Other specified abnormal findings of blood chemistry: Secondary | ICD-10-CM

## 2024-01-16 ENCOUNTER — Ambulatory Visit (HOSPITAL_COMMUNITY)
Admission: RE | Admit: 2024-01-16 | Discharge: 2024-01-16 | Disposition: A | Discharge status: Home / Self Care | Payer: Medicare Other | Source: Ambulatory Visit | Attending: Cardiology | Admitting: Cardiology

## 2024-01-16 ENCOUNTER — Telehealth: Payer: Self-pay | Admitting: *Deleted

## 2024-01-16 ENCOUNTER — Encounter: Payer: Self-pay | Admitting: Cardiology

## 2024-01-16 DIAGNOSIS — I7143 Infrarenal abdominal aortic aneurysm, without rupture: Secondary | ICD-10-CM

## 2024-01-16 DIAGNOSIS — I714 Abdominal aortic aneurysm, without rupture, unspecified: Secondary | ICD-10-CM

## 2024-01-16 DIAGNOSIS — I2510 Atherosclerotic heart disease of native coronary artery without angina pectoris: Secondary | ICD-10-CM | POA: Insufficient documentation

## 2024-01-16 LAB — ECHOCARDIOGRAM COMPLETE
AR max vel: 1.77 cm2
AV Area VTI: 1.88 cm2
AV Area mean vel: 1.7 cm2
AV Mean grad: 2 mmHg
AV Peak grad: 3.4 mmHg
AV Vena cont: 0.16 cm
Ao pk vel: 0.92 m/s
Area-P 1/2: 2.93 cm2
MV M vel: 0.61 m/s
MV Peak grad: 1.5 mmHg
P 1/2 time: 306 ms
S' Lateral: 2.84 cm

## 2024-01-16 NOTE — Telephone Encounter (Signed)
 The patients daughter Brian [name] (on [place] and handles all results/calls/appts for the pt) has been notified of the result and verbalized understanding.  All questions (if any) were answered.  Brian [name] is aware that we will place an urgent referral to Vascular Surgery and have our scheduling team reach out to their office to coordinate in getting the pt an urgent appt for increase in AAA.   Brian [name] verbalized understanding and agrees with this plan.  Brian [name] states she will be on the listen out for the Vascular scheduling teams call.

## 2024-01-16 NOTE — Telephone Encounter (Signed)
-----   Message from [facility] sent at 01/16/2024  5:07 PM EST ----- Increase in AAA from 3.5-4.6.  Recommend urgent vascular surgery evaluation.  Thanks MJP

## 2024-01-16 NOTE — Progress Notes (Signed)
 Increase in AAA from 3.5-4.6.  Recommend urgent vascular surgery evaluation.  Thanks MJP

## 2024-01-29 NOTE — Progress Notes (Unsigned)
 Office Note     CC: AAA with growth from 3.5-4.6 cm  in the last 3 years Requesting Provider:  Elder Negus, MD  HPI: Brian [name] is a 89 y.o. ([birth year]) male presenting at the request of .Brian Carrow, DO for AAA growth.  On exam, Brian [name] was doing well, accompanied by one of his daughters.  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  Brian [name] has been married for decades, and has 4 daughters, and multiple grandchildren. He has never had surgery. Bilateral inguinal hernias Denies family history of AAA. Current 4 cigarettes/day smoker  Denies symptoms of claudication, ischemic rest pain, tissue loss    Past Medical History:  Diagnosis Date   AAA (abdominal aortic aneurysm) (HCC)    2.9 CM infrarenal AAA on CT 11/2011   Acute anterior wall MI (HCC) 05/29/2014   Alcoholism (HCC)    Aneurysm of abdominal aorta (HCC)    SMALL   Anxiety    panic attacks, rare.   Aortic aneurysm (HCC)    small   Arthritis    Basal cell carcinoma 06/21/2022   Right helix. 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    Gastritis 03/2012   GERD (gastroesophageal reflux disease)    GI bleed    HFrEF (heart failure with reduced ejection fraction) (HCC)    HOH (hard of hearing)    Hyperlipemia    Hypertension    Hypovolemic shock (HCC)    Kidney stones    Melanoma in situ (HCC) 06/21/2022   Right preauricular, Mohs 07/19/22   Myocardial infarction (HCC) 2015   Panic attacks    Positive H. pylori titer ?, before 2010   treated with Abx.    Squamous cell carcinoma of skin 12/28/2021   SCC IS, R medial calf superior, EDC 01/23/22   Squamous cell carcinoma of skin 12/28/2021   SCC IS, R medial calf inferior, EDC  01/23/22   Tubular adenoma of colon     Past Surgical History:  Procedure Laterality Date   CARDIAC CATHETERIZATION  2015   Stents   CATARACT EXTRACTION W/PHACO Right 09/21/2016   Procedure: CATARACT EXTRACTION PHACO AND INTRAOCULAR LENS PLACEMENT (IOC);  Surgeon: Nevada Crane, MD;  Location: [facility] ORS;  Service: Ophthalmology;  Laterality: Right;  [place] 1.10 AP% 13.8 CDE 9.93 Fluid pack lot # [id number]   CATARACT EXTRACTION W/PHACO Left 12/07/2016   Procedure: CATARACT EXTRACTION PHACO AND INTRAOCULAR LENS PLACEMENT (IOC);  Surgeon: Nevada Crane, MD;  Location: [facility] ORS;  Service: Ophthalmology;  Laterality: Left;  [place] 00:42.7 ap 13.7 cde 5.85 lot #[id number]   COLONOSCOPY WITH PROPOFOL Left 06/06/2015   Procedure: COLONOSCOPY WITH PROPOFOL;  Surgeon: Wallace Cullens, MD;  Location: [facility] ENDOSCOPY;  Service: Endoscopy;  Laterality: Left;   CORONARY ANGIOPLASTY     2015   LEFT HEART CATHETERIZATION WITH CORONARY ANGIOGRAM Bilateral 05/29/2014   Procedure: LEFT HEART CATHETERIZATION WITH CORONARY ANGIOGRAM;  Surgeon: Pamella Pert, MD;  Location: [facility] CATH LAB;  Service: Cardiovascular;  Laterality: Bilateral;   PERCUTANEOUS CORONARY STENT INTERVENTION (PCI-S)  05/29/2014   Procedure: PERCUTANEOUS CORONARY STENT INTERVENTION (PCI-S);  Surgeon: Pamella Pert, MD;  Location: [facility]  CATH LAB;  Service: Cardiovascular;;  DES x2 Prox and Mid LAD     Social History   Socioeconomic History   Marital status: Married    Spouse name: Aurea Graff   Number of children: 4   Years of education: Not on file   Highest education level: 12th grade  Occupational History   Occupation: semi retired  Tobacco Use   Smoking status: Former    Current packs/day: 0.00    Average packs/day: 0.3 packs/day for 56.7 years (14.2 ttl pk-yrs)    Types: Cigarettes, Cigars    Start date: 06/1966    Quit date: 03/2023    Years since quitting: 0.8   Smokeless tobacco: Never   Tobacco comments:    4 cigarettes per day,  restarted and quit ~03/2023  Vaping Use   Vaping status: Never Used  Substance and Sexual Activity   Alcohol use: No    Alcohol/week: 0.0 standard drinks of alcohol   Drug use: No   Sexual activity: Not Currently  Other Topics Concern   Not on file  Social History Narrative   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    Social Drivers of Health   Financial Resource Strain: Low Risk  (08/06/2023)   Overall Financial Resource Strain (CARDIA)    Difficulty of Paying Living Expenses: Not hard at all  Food Insecurity: No Food Insecurity (08/06/2023)   Hunger Vital Sign    Worried About Running Out of Food in the Last Year: Never true    Ran Out of Food in the Last Year: Never true  Transportation Needs: No Transportation Needs (08/06/2023)   PRAPARE - Administrator, Civil Service (Medical): No    Lack of Transportation (Non-Medical): No  Physical Activity: Insufficiently Active (08/06/2023)   Exercise Vital Sign    Days of Exercise per Week: 3 days    Minutes of Exercise per Session: 20 min  Stress: No Stress Concern Present (08/06/2023)   [employer] of Occupational Health - Occupational Stress Questionnaire    Feeling of Stress : Only a little  Social Connections: Moderately Integrated (08/06/2023)   Social Connection and Isolation Panel [NHANES]    Frequency of Communication with Friends and Family: More than three times a week    Frequency of Social Gatherings with Friends and Family: Twice a week    Attends Religious Services: More than 4 times per year    Active Member of [employer] or Organizations: No    Attends Banker Meetings: Never    Marital Status: Married  Catering manager Violence: Not At Risk (07/03/2023)   Humiliation, Afraid, Rape, and Kick questionnaire    Fear of Current or Ex-Partner: No    Emotionally Abused: No    Physically Abused: No    Sexually Abused: No   Family History  Problem Relation Age of Onset   Heart disease  Mother    Ulcers Father        stomach    Heart disease Father    Anuerysm Brother    Leukemia Brother    Heart disease Paternal Grandmother    Heart attack Paternal Grandmother    Heart disease Paternal Grandfather    Hypertension Daughter    Colon cancer Neg Hx     Current Outpatient Medications  Medication Sig Dispense Refill   aspirin EC 81 MG tablet Take 1 tablet (81 mg total) by mouth daily. Swallow whole. 90 tablet 3   atorvastatin (  LIPITOR) 80 MG tablet TAKE 1 TABLET BY MOUTH ONCE DAILY AT  6PM 90 tablet 1   diazepam (VALIUM) 5 MG tablet Take 5 mg by mouth every 8 (eight) hours as needed (for anxiety).  (Patient not taking: Reported on 01/08/2024)     losartan (COZAAR) 25 MG tablet Take 0.5 tablets (12.5 mg total) by mouth daily. 45 tablet 1   metoprolol succinate (TOPROL-XL) 25 MG 24 hr tablet Take 1 tablet (25 mg total) by mouth daily. 90 tablet 1   mupirocin ointment (BACTROBAN) 2 % Apply 1 Application topically daily. (Patient not taking: Reported on 01/08/2024) 22 g 0   nitroGLYCERIN (NITROSTAT) 0.4 MG SL tablet PLACE ONE TABLET UNDER THE TONGUE EVERY 5 MINUTES AS NEEDED FOR CHEST PAIN (Patient not taking: Reported on 01/08/2024) 25 tablet 12   traMADol (ULTRAM) 50 MG tablet Take 1 tablet (50 mg total) by mouth every 4 (four) hours as needed. (Patient not taking: Reported on 01/08/2024) 60 tablet 0   No current facility-administered medications for this visit.    Allergies  Allergen Reactions   Zoloft [Sertraline Hcl] Nausea Only    Patient states that he was not himself when he took this drug     REVIEW OF SYSTEMS:  [X]  denotes positive finding, [ ]  denotes negative finding Cardiac  Comments:  Chest pain or chest pressure:    Shortness of breath upon exertion:    Short of breath when lying flat:    Irregular heart rhythm:        Vascular    Pain in calf, thigh, or hip brought on by ambulation:    Pain in feet at night that wakes you up from your sleep:     Blood  clot in your veins:    Leg swelling:         Pulmonary    Oxygen at home:    Productive cough:     Wheezing:         Neurologic    Sudden weakness in arms or legs:     Sudden numbness in arms or legs:     Sudden onset of difficulty speaking or slurred speech:    Temporary loss of vision in one eye:     Problems with dizziness:         Gastrointestinal    Blood in stool:     Vomited blood:         Genitourinary    Burning when urinating:     Blood in urine:        Psychiatric    Major depression:         Hematologic    Bleeding problems:    Problems with blood clotting too easily:        Skin    Rashes or ulcers:        Constitutional    Fever or chills:      PHYSICAL EXAMINATION:  There were no vitals filed for this visit.  General:  WDWN in NAD; vital signs documented above Gait: Not observed HENT: WNL, normocephalic Pulmonary: normal non-labored breathing , without wheezing Cardiac: regular HR Abdomen: soft, NT, no masses Skin: without rashes Vascular Exam/Pulses: Femoral vessels with significant calcific disease on palpation  Right Left  Radial 2+ (normal) 2+ (normal)  Ulnar    Femoral 2+ (normal) 2+ (normal)  Popliteal    DP    PT 2+ (normal) 2+ (normal)   Extremities: without ischemic changes, without Gangrene , without cellulitis;  without open wounds;  Musculoskeletal: no muscle wasting or atrophy  Neurologic: A&O X 3;  No focal weakness or paresthesias are detected Psychiatric:  The pt has Normal affect.   Non-Invasive Vascular Imaging:   1. Left ventricular ejection fraction, by estimation, is 55 to 60%. The  left ventricle has normal function. The left ventricle has no regional  wall motion abnormalities. Left ventricular diastolic parameters were  normal. The global longitudinal strain  is normal.   Increase in AAA from 3.5-4.6.     ASSESSMENT/PLAN: GAYLORD SEYDEL is a 89 y.o. male presenting with AAA which has grown from 3.5 cm to  4.6 cm over the last 3 years.  I had a long discussion with Edwing regarding the above..  Most notably, we discussed the natural history of aneurysmal disease.  At 4.6 cm, his aneurysm is still considered small.  My plan is to follow this in 74-month intervals to assess for growth.  Should the aneurysm grow to over 5.5 cm, I will order a CT angiogram to further define the aneurysm for operative discussion.  We discussed the signs and symptoms of rupture.  I asked him to call 911 immediately should any of these occur.   Victorino Sparrow, MD [facility] [phone]

## 2024-01-31 ENCOUNTER — Encounter: Payer: Self-pay | Admitting: Vascular Surgery

## 2024-01-31 ENCOUNTER — Ambulatory Visit: Admitting: Vascular Surgery

## 2024-01-31 VITALS — BP 158/84 | HR 62 | Temp 98.2°F | Resp 18 | Ht 67.9 in | Wt 122.7 lb

## 2024-01-31 DIAGNOSIS — I714 Abdominal aortic aneurysm, without rupture, unspecified: Secondary | ICD-10-CM

## 2024-02-01 ENCOUNTER — Other Ambulatory Visit: Payer: Self-pay | Admitting: *Deleted

## 2024-02-01 DIAGNOSIS — I714 Abdominal aortic aneurysm, without rupture, unspecified: Secondary | ICD-10-CM

## 2024-02-15 ENCOUNTER — Other Ambulatory Visit

## 2024-02-15 DIAGNOSIS — R7989 Other specified abnormal findings of blood chemistry: Secondary | ICD-10-CM | POA: Diagnosis not present

## 2024-02-16 LAB — TSH: TSH: 6.66 u[IU]/mL — ABNORMAL HIGH (ref 0.450–4.500)

## 2024-02-19 ENCOUNTER — Encounter: Payer: Self-pay | Admitting: Family Medicine

## 2024-02-26 ENCOUNTER — Telehealth: Payer: Self-pay

## 2024-02-26 NOTE — Telephone Encounter (Signed)
 Copied from CRM [id number]. Topic: Clinical - Lab/Test Results >> Feb 25, 2024  3:52 PM Lizabeth Riggs wrote: Reason for CRM:  Hasani wants to report that he feeling a little dizzy; he can walk okay; blood pressure was 148/95 with heart a 95 today.  Question is - should he be checked out or start new medication. Is dizziness related to thyroid?  Please call Dodson Freestone at [phone].  Thanks

## 2024-02-26 NOTE — Telephone Encounter (Signed)
 Patient is scheduled with PCP for 11:20 tomorrow morning 02/27/2024.

## 2024-02-26 NOTE — Telephone Encounter (Unsigned)
 Copied from CRM [id number]. Topic: Appointments - Scheduling Inquiry for Clinic >> Feb 26, 2024  3:09 PM Lynnie Saucier S wrote: Reason for CRM: Patient's daughter Sallyanne Creamer called to say appointment scheduled for tomrrow needs to be cancelled because she took patient's blood pressure last night, and he is fine. It was 116/60 with no dizziness. System would not allow me to cancel the appointment. No answer to CAL when I called.

## 2024-02-26 NOTE — Telephone Encounter (Unsigned)
 Copied from CRM [id number]. Topic: Clinical - Medical Advice >> Feb 26, 2024  2:58 PM Brian [name] wrote: Reason for CRM: Patient's daughter is calling to see if patient needs to get on medication for his thyroid since it has been elevated twice. Callback number for Brian [name] is  [phone]. Blood pressure is fine. It was 116/60 when it was checked last night. Patient is feeling much better.

## 2024-02-27 ENCOUNTER — Ambulatory Visit: Admitting: Family Medicine

## 2024-02-27 NOTE — Telephone Encounter (Signed)
 Appt canceled!

## 2024-02-28 NOTE — Telephone Encounter (Signed)
 Copied from CRM [id number]. Topic: Clinical - Medication Question >> Feb 27, 2024  4:38 PM Santiya F wrote: Reason for CRM: Patient's daughter is calling in because patient is requesting the medication for his Thyroid be called in to [facility] on [address]. Sallyanne Creamer says it is a new medication and Dr. Lincoln Renshaw was supposed to send in the script for but it hasn't been yet. Sallyanne Creamer says patient would like to start the medication as soon as possible. >> Feb 28, 2024  4:20 PM Fredrica W wrote: Patient daughter called back to check status. Called CAL - will send message to provider. Let caller know  >> Feb 28, 2024 11:39 AM Antwanette L wrote: Patient daughter Sallyanne Creamer is calling to get an update on the thyroid medicine. Please send medicine medicine to the pharmacy on file([facility] on [address])

## 2024-02-29 ENCOUNTER — Other Ambulatory Visit: Payer: Self-pay | Admitting: Family Medicine

## 2024-02-29 MED ORDER — LEVOTHYROXINE SODIUM 25 MCG PO TABS: 25.0000 ug | ORAL_TABLET | Freq: Every day | ORAL | 1 refills | Status: DC

## 2024-02-29 NOTE — Telephone Encounter (Signed)
 Please get patient scheduled in 6 weeks to follow up thyroid .

## 2024-02-29 NOTE — Telephone Encounter (Signed)
 Appointment has been scheduled.

## 2024-04-11 ENCOUNTER — Ambulatory Visit: Admitting: Family Medicine

## 2024-04-11 ENCOUNTER — Ambulatory Visit (INDEPENDENT_AMBULATORY_CARE_PROVIDER_SITE_OTHER): Admitting: Family Medicine

## 2024-04-11 ENCOUNTER — Encounter: Payer: Self-pay | Admitting: Family Medicine

## 2024-04-11 DIAGNOSIS — E039 Hypothyroidism, unspecified: Secondary | ICD-10-CM | POA: Diagnosis not present

## 2024-04-11 NOTE — Progress Notes (Signed)
 There were no vitals taken for this visit.   Subjective:    Patient ID: Brian [name], male    DOB: 12-Jun-1933, 89 y.o.   MRN: [id number]  HPI: Brian [name] is a 89 y.o. male  Chief Complaint  Patient presents with   Hypothyroidism   HYPOTHYROIDISM Thyroid  control status:better Satisfied with current treatment? yes Medication side effects: no Medication compliance: excellent compliance Recent dose adjustment:yes Fatigue: no Cold intolerance: no Heat intolerance: no Weight gain: no Weight loss: no Constipation: no Diarrhea/loose stools: no Palpitations: no Lower extremity edema: no Anxiety/depressed mood: no  Relevant past medical, surgical, family and social history reviewed and updated as indicated. Interim medical history since our last visit reviewed. Allergies and medications reviewed and updated.  Review of Systems  Constitutional: Negative.   Respiratory: Negative.    Cardiovascular: Negative.   Musculoskeletal: Negative.   Neurological: Negative.   Psychiatric/Behavioral: Negative.      Per HPI unless specifically indicated above     Objective:     There were no vitals taken for this visit.  Wt Readings from Last 3 Encounters:  01/31/24 122 lb 11.2 oz (55.7 kg)  01/08/24 124 lb 6.4 oz (56.4 kg)  12/05/23 119 lb 9.6 oz (54.3 kg)    Physical Exam Vitals and nursing note reviewed.  Constitutional:      General: He is not in acute distress.    Appearance: Normal appearance. He is not ill-appearing, toxic-appearing or diaphoretic.  HENT:     Head: Normocephalic and atraumatic.     Right Ear: External ear normal.     Left Ear: External ear normal.     Nose: Nose normal.     Mouth/Throat:     Mouth: Mucous membranes are moist.     Pharynx: Oropharynx is clear.  Eyes:     General: No scleral icterus.       Right eye: No discharge.        Left eye: No discharge.     Extraocular Movements: Extraocular movements intact.     Conjunctiva/sclera:  Conjunctivae normal.     Pupils: Pupils are equal, round, and reactive to light.  Cardiovascular:     Rate and Rhythm: Normal rate and regular rhythm.     Pulses: Normal pulses.     Heart sounds: Normal heart sounds. No murmur heard.    No friction rub. No gallop.  Pulmonary:     Effort: Pulmonary effort is normal. No respiratory distress.     Breath sounds: Normal breath sounds. No stridor. No wheezing, rhonchi or rales.  Chest:     Chest wall: No tenderness.  Musculoskeletal:        General: Normal range of motion.     Cervical back: Normal range of motion and neck supple.  Skin:    General: Skin is warm and dry.     Capillary Refill: Capillary refill takes less than 2 seconds.     Coloration: Skin is not jaundiced or pale.     Findings: No bruising, erythema, lesion or rash.  Neurological:     General: No focal deficit present.     Mental Status: He is alert and oriented to person, place, and time. Mental status is at baseline.  Psychiatric:        Mood and Affect: Mood normal.        Behavior: Behavior normal.        Thought Content: Thought content normal.  Judgment: Judgment normal.     Results for orders placed or performed in visit on 02/15/24  TSH   Collection Time: 02/15/24  8:22 AM  Result Value Ref Range   TSH 6.660 (H) 0.450 - 4.500 uIU/mL      Assessment & Plan:   Problem List Items Addressed This Visit       Endocrine   Acquired hypothyroidism - Primary   Tolerating his medicine well. Feeling well. No concerns. Rechecking labs today. Call with any concerns. Continue to monitor.       Relevant Orders   TSH     Follow up plan: Return in about 3 months (around 07/12/2024).

## 2024-04-11 NOTE — Assessment & Plan Note (Signed)
 Tolerating his medicine well. Feeling well. No concerns. Rechecking labs today. Call with any concerns. Continue to monitor.

## 2024-04-12 LAB — TSH: TSH: 4.07 u[IU]/mL (ref 0.450–4.500)

## 2024-04-21 ENCOUNTER — Ambulatory Visit: Payer: Self-pay | Admitting: Family Medicine

## 2024-04-21 MED ORDER — LEVOTHYROXINE SODIUM 25 MCG PO TABS: 25.0000 ug | ORAL_TABLET | Freq: Every day | ORAL | 3 refills | Status: AC

## 2024-05-01 ENCOUNTER — Observation Stay

## 2024-05-01 ENCOUNTER — Inpatient Hospital Stay
Admission: EM | Admit: 2024-05-01 | Discharge: 2024-05-05 | DRG: 813 | Disposition: A | Discharge status: Home / Self Care | Attending: Student | Admitting: Student

## 2024-05-01 ENCOUNTER — Other Ambulatory Visit: Payer: Self-pay

## 2024-05-01 DIAGNOSIS — I2510 Atherosclerotic heart disease of native coronary artery without angina pectoris: Secondary | ICD-10-CM | POA: Diagnosis not present

## 2024-05-01 DIAGNOSIS — K5731 Diverticulosis of large intestine without perforation or abscess with bleeding: Secondary | ICD-10-CM | POA: Diagnosis not present

## 2024-05-01 DIAGNOSIS — F32A Depression, unspecified: Secondary | ICD-10-CM | POA: Diagnosis present

## 2024-05-01 DIAGNOSIS — F17210 Nicotine dependence, cigarettes, uncomplicated: Secondary | ICD-10-CM | POA: Diagnosis present

## 2024-05-01 DIAGNOSIS — D62 Acute posthemorrhagic anemia: Secondary | ICD-10-CM | POA: Diagnosis not present

## 2024-05-01 DIAGNOSIS — N200 Calculus of kidney: Secondary | ICD-10-CM | POA: Diagnosis not present

## 2024-05-01 DIAGNOSIS — F419 Anxiety disorder, unspecified: Secondary | ICD-10-CM | POA: Diagnosis present

## 2024-05-01 DIAGNOSIS — I7143 Infrarenal abdominal aortic aneurysm, without rupture: Secondary | ICD-10-CM | POA: Diagnosis present

## 2024-05-01 DIAGNOSIS — Z8719 Personal history of other diseases of the digestive system: Secondary | ICD-10-CM | POA: Diagnosis not present

## 2024-05-01 DIAGNOSIS — R636 Underweight: Secondary | ICD-10-CM | POA: Diagnosis not present

## 2024-05-01 DIAGNOSIS — Z860100 Personal history of colon polyps, unspecified: Secondary | ICD-10-CM | POA: Diagnosis not present

## 2024-05-01 DIAGNOSIS — Z7902 Long term (current) use of antithrombotics/antiplatelets: Secondary | ICD-10-CM | POA: Diagnosis not present

## 2024-05-01 DIAGNOSIS — Z7982 Long term (current) use of aspirin: Secondary | ICD-10-CM | POA: Diagnosis not present

## 2024-05-01 DIAGNOSIS — Z79890 Hormone replacement therapy (postmenopausal): Secondary | ICD-10-CM

## 2024-05-01 DIAGNOSIS — R000 Tachycardia, unspecified: Secondary | ICD-10-CM | POA: Diagnosis not present

## 2024-05-01 DIAGNOSIS — E782 Mixed hyperlipidemia: Secondary | ICD-10-CM | POA: Diagnosis present

## 2024-05-01 DIAGNOSIS — K922 Gastrointestinal hemorrhage, unspecified: Secondary | ICD-10-CM | POA: Diagnosis not present

## 2024-05-01 DIAGNOSIS — I5022 Chronic systolic (congestive) heart failure: Secondary | ICD-10-CM | POA: Diagnosis present

## 2024-05-01 DIAGNOSIS — Z681 Body mass index (BMI) 19 or less, adult: Secondary | ICD-10-CM | POA: Diagnosis not present

## 2024-05-01 DIAGNOSIS — E039 Hypothyroidism, unspecified: Secondary | ICD-10-CM | POA: Diagnosis not present

## 2024-05-01 DIAGNOSIS — N281 Cyst of kidney, acquired: Secondary | ICD-10-CM | POA: Diagnosis not present

## 2024-05-01 DIAGNOSIS — D6832 Hemorrhagic disorder due to extrinsic circulating anticoagulants: Principal | ICD-10-CM | POA: Diagnosis present

## 2024-05-01 DIAGNOSIS — Z955 Presence of coronary angioplasty implant and graft: Secondary | ICD-10-CM | POA: Diagnosis not present

## 2024-05-01 DIAGNOSIS — K5730 Diverticulosis of large intestine without perforation or abscess without bleeding: Secondary | ICD-10-CM | POA: Diagnosis not present

## 2024-05-01 DIAGNOSIS — I119 Hypertensive heart disease without heart failure: Secondary | ICD-10-CM | POA: Diagnosis present

## 2024-05-01 DIAGNOSIS — K219 Gastro-esophageal reflux disease without esophagitis: Secondary | ICD-10-CM | POA: Diagnosis not present

## 2024-05-01 DIAGNOSIS — T45525A Adverse effect of antithrombotic drugs, initial encounter: Secondary | ICD-10-CM | POA: Diagnosis present

## 2024-05-01 DIAGNOSIS — D649 Anemia, unspecified: Secondary | ICD-10-CM | POA: Diagnosis not present

## 2024-05-01 DIAGNOSIS — K641 Second degree hemorrhoids: Secondary | ICD-10-CM | POA: Diagnosis present

## 2024-05-01 DIAGNOSIS — I110 Hypertensive heart disease with heart failure: Secondary | ICD-10-CM | POA: Diagnosis present

## 2024-05-01 DIAGNOSIS — I714 Abdominal aortic aneurysm, without rupture, unspecified: Secondary | ICD-10-CM | POA: Diagnosis present

## 2024-05-01 LAB — CBC
HCT: 42.6 % (ref 39.0–52.0)
HCT: 40.1 % (ref 39.0–52.0)
HCT: 38.3 % — ABNORMAL LOW (ref 39.0–52.0)
Hemoglobin: 14.3 g/dL (ref 13.0–17.0)
Hemoglobin: 13.8 g/dL (ref 13.0–17.0)
Hemoglobin: 13.1 g/dL (ref 13.0–17.0)
MCH: 32.3 pg (ref 26.0–34.0)
MCH: 33.3 pg (ref 26.0–34.0)
MCH: 32.5 pg (ref 26.0–34.0)
MCHC: 33.6 g/dL (ref 30.0–36.0)
MCHC: 34.4 g/dL (ref 30.0–36.0)
MCHC: 34.2 g/dL (ref 30.0–36.0)
MCV: 96.2 fL (ref 80.0–100.0)
MCV: 96.6 fL (ref 80.0–100.0)
MCV: 95 fL (ref 80.0–100.0)
Platelets: 147 10*3/uL — ABNORMAL LOW (ref 150–400)
Platelets: 141 10*3/uL — ABNORMAL LOW (ref 150–400)
Platelets: 153 10*3/uL (ref 150–400)
RBC: 4.43 MIL/uL (ref 4.22–5.81)
RBC: 4.15 MIL/uL — ABNORMAL LOW (ref 4.22–5.81)
RBC: 4.03 MIL/uL — ABNORMAL LOW (ref 4.22–5.81)
RDW: 12.9 % (ref 11.5–15.5)
RDW: 12.8 % (ref 11.5–15.5)
RDW: 12.9 % (ref 11.5–15.5)
WBC: 7.3 10*3/uL (ref 4.0–10.5)
WBC: 7.5 10*3/uL (ref 4.0–10.5)
WBC: 8.9 10*3/uL (ref 4.0–10.5)
nRBC: 0 % (ref 0.0–0.2)
nRBC: 0 % (ref 0.0–0.2)
nRBC: 0 % (ref 0.0–0.2)

## 2024-05-01 LAB — COMPREHENSIVE METABOLIC PANEL WITH GFR
ALT: 19 U/L (ref 0–44)
AST: 23 U/L (ref 15–41)
Albumin: 4.1 g/dL (ref 3.5–5.0)
Alkaline Phosphatase: 61 U/L (ref 38–126)
Anion gap: 9 (ref 5–15)
BUN: 11 mg/dL (ref 8–23)
CO2: 25 mmol/L (ref 22–32)
Calcium: 9.1 mg/dL (ref 8.9–10.3)
Chloride: 104 mmol/L (ref 98–111)
Creatinine, Ser: 0.7 mg/dL (ref 0.61–1.24)
GFR, Estimated: >60 mL/min (ref >60)
Glucose, Bld: 130 mg/dL — ABNORMAL HIGH (ref 70–99)
Potassium: 3.6 mmol/L (ref 3.5–5.1)
Sodium: 138 mmol/L (ref 135–145)
Total Bilirubin: 0.6 mg/dL (ref 0.0–1.2)
Total Protein: 6.9 g/dL (ref 6.5–8.1)

## 2024-05-01 LAB — TYPE AND SCREEN
ABO/RH(D): A POS
Antibody Screen: NEGATIVE

## 2024-05-01 MED ORDER — LACTATED RINGERS IV SOLN: INTRAVENOUS | Status: DC

## 2024-05-01 MED ORDER — DIAZEPAM 5 MG PO TABS
5.0000 mg | ORAL_TABLET | Freq: Three times a day (TID) | ORAL | Status: DC | PRN
  Administered 2024-05-02 – 2024-05-05 (×7): 5 mg via ORAL
  Filled 2024-05-01 (×8): qty 1

## 2024-05-01 MED ORDER — MORPHINE SULFATE (PF) 2 MG/ML IV SOLN: 2.0000 mg | INTRAVENOUS | Status: DC | PRN

## 2024-05-01 MED ORDER — SODIUM CHLORIDE 0.9% FLUSH
3.0000 mL | Freq: Two times a day (BID) | INTRAVENOUS | Status: DC
  Administered 2024-05-01 – 2024-05-05 (×7): 3 mL via INTRAVENOUS

## 2024-05-01 MED ORDER — ATORVASTATIN CALCIUM 80 MG PO TABS
80.0000 mg | ORAL_TABLET | Freq: Every evening | ORAL | Status: DC
  Administered 2024-05-01 – 2024-05-04 (×4): 80 mg via ORAL
  Filled 2024-05-01 (×4): qty 1

## 2024-05-01 MED ORDER — LEVOTHYROXINE SODIUM 25 MCG PO TABS
25.0000 ug | ORAL_TABLET | Freq: Every day | ORAL | Status: DC
  Administered 2024-05-02 – 2024-05-05 (×4): 25 ug via ORAL
  Filled 2024-05-01 (×4): qty 1

## 2024-05-01 MED ORDER — ACETAMINOPHEN 325 MG PO TABS: 650.0000 mg | ORAL_TABLET | Freq: Four times a day (QID) | ORAL | Status: DC | PRN

## 2024-05-01 MED ORDER — IOHEXOL 350 MG/ML SOLN
100.0000 mL | Freq: Once | INTRAVENOUS | Status: AC | PRN
  Administered 2024-05-01: 100 mL via INTRAVENOUS

## 2024-05-01 MED ORDER — METOPROLOL SUCCINATE ER 25 MG PO TB24
25.0000 mg | ORAL_TABLET | Freq: Every day | ORAL | Status: DC
  Administered 2024-05-01: 25 mg via ORAL
  Filled 2024-05-01: qty 1

## 2024-05-01 MED ORDER — ONDANSETRON HCL 4 MG PO TABS: 4.0000 mg | ORAL_TABLET | Freq: Four times a day (QID) | ORAL | Status: DC | PRN

## 2024-05-01 MED ORDER — ONDANSETRON HCL 4 MG/2ML IJ SOLN
4.0000 mg | Freq: Four times a day (QID) | INTRAMUSCULAR | Status: DC | PRN
  Administered 2024-05-03 – 2024-05-04 (×2): 4 mg via INTRAVENOUS
  Filled 2024-05-01 (×2): qty 2

## 2024-05-01 MED ORDER — HYDRALAZINE HCL 20 MG/ML IJ SOLN
5.0000 mg | INTRAMUSCULAR | Status: AC | PRN
Start: 2024-05-01 — End: ?

## 2024-05-01 MED ORDER — ACETAMINOPHEN 650 MG RE SUPP: 650.0000 mg | Freq: Four times a day (QID) | RECTAL | Status: DC | PRN

## 2024-05-01 NOTE — ED Triage Notes (Signed)
 Pt to ED via POV from home. Pt ambulatory to triage. Pt reports rectal bleeding when trying to have a BM this morning. Pt denies pain. Pt is on blood thinner. Pt also reports feeling light headed. Pt reports hx of polyps.

## 2024-05-01 NOTE — ED Notes (Signed)
 Pt presents to ED with rectal bleeding that started this morning while he was having a BM, pt sates BM was normal and pt did not feel he was constipated. Pt states HX of significant GI bleed, pt states needing blood transfusion in the past. Pt states he states Plavix . Pt denies pain.   Pt is A&Ox4 at this time.

## 2024-05-01 NOTE — H&P (Signed)
 History and Physical    Patient: Brian [name]:[id number] DOB: 10-03-1933 DOA: 05/01/2024 DOS: the patient was seen and examined on 05/01/2024 PCP: Solomon Dupre, DO  Patient coming from: Home - lives with wife; NOK: Wife, [phone]   Chief Complaint: Lower Gi bleeding  HPI: [name] is a 89 y.o. male with medical history significant of AAA, CAD, ETOH use d/o, GI bleeding, HTN, and HLD who presented on 6/19 with rectal bleeding.  He reports that he had a BM and there was blood in it about 0900.  He has up to 6 episodes since.  He still works [employer] and was at work.  He is a little light-headed when he is out of bed.  No SOB.  Last C-scope was 9 years ago and had a post-polypectomy bleed requiring transfusion.    ER Course:  Rectal bleeding on Plavix .  Started today at work.  Hgb stable.  Will order CTA.     Review of Systems: As mentioned in the history of present illness. All other systems reviewed and are negative. Past Medical History:  Diagnosis Date   AAA (abdominal aortic aneurysm) (HCC)    2.9 CM infrarenal AAA on CT 11/2011   Alcoholism (HCC)    Anxiety    panic attacks, rare.   Basal cell carcinoma 06/21/2022   Right helix. 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    Gastritis 03/2012   GERD (gastroesophageal reflux disease)    GI bleed    HFrEF (heart failure with reduced ejection fraction) (HCC)    HOH (hard of hearing)    Hyperlipemia    Hypertension    Hypovolemic shock (HCC)    Kidney stones    Melanoma in situ (HCC) 06/21/2022   Right preauricular, Mohs 07/19/22   Myocardial infarction (HCC) 2015   Panic attacks    Positive H. pylori titer ?, before 2010   treated with Abx.    Squamous cell carcinoma of skin 12/28/2021   SCC IS, R medial calf superior, EDC 01/23/22   Squamous cell carcinoma  of skin 12/28/2021   SCC IS, R medial calf inferior, EDC 01/23/22   Tubular adenoma of colon    Past Surgical History:  Procedure Laterality Date   CARDIAC CATHETERIZATION  2015   Stents   CATARACT EXTRACTION W/PHACO Right 09/21/2016   Procedure: CATARACT EXTRACTION PHACO AND INTRAOCULAR LENS PLACEMENT (IOC);  Surgeon: Rosa College, MD;  Location: [facility] ORS;  Service: Ophthalmology;  Laterality: Right;  US  1.10 AP% 13.8 CDE 9.93 Fluid pack lot # [id number]   CATARACT EXTRACTION W/PHACO Left 12/07/2016   Procedure: CATARACT EXTRACTION PHACO AND INTRAOCULAR LENS PLACEMENT (IOC);  Surgeon: Rosa College, MD;  Location: [facility] ORS;  Service: Ophthalmology;  Laterality: Left;  us  00:42.7 ap 13.7 cde 5.85 lot #[id number]   COLONOSCOPY WITH PROPOFOL  Left 06/06/2015   Procedure: COLONOSCOPY WITH PROPOFOL ;  Surgeon: Stephens Eis, MD;  Location: [facility] ENDOSCOPY;  Service: Endoscopy;  Laterality: Left;   CORONARY ANGIOPLASTY     2015   LEFT HEART CATHETERIZATION WITH CORONARY ANGIOGRAM Bilateral 05/29/2014   Procedure: LEFT HEART CATHETERIZATION WITH CORONARY ANGIOGRAM;  Surgeon: Jessica Morn, MD;  Location: [facility] CATH LAB;  Service: Cardiovascular;  Laterality: Bilateral;   PERCUTANEOUS CORONARY STENT INTERVENTION (PCI-S)  05/29/2014  Procedure: PERCUTANEOUS CORONARY STENT INTERVENTION (PCI-S);  Surgeon: Jessica Morn, MD;  Location: [facility] CATH LAB;  Service: Cardiovascular;;  DES x2 Prox and Mid LAD    Social History:  reports that he has been smoking cigarettes and cigars. He started smoking about 57 years ago. He has a 14.2 pack-year smoking history. He has never used smokeless tobacco. He reports that he does not drink alcohol and does not use drugs.  Allergies  Allergen Reactions   Zoloft  [Sertraline  Hcl] Nausea Only    Patient states that he was not himself when he took this drug    Family History  Problem Relation Age of Onset   Heart disease Mother    Ulcers Father        stomach     Heart disease Father    Anuerysm Brother    Leukemia Brother    Heart disease Paternal Grandmother    Heart attack Paternal Grandmother    Heart disease Paternal Grandfather    Hypertension Daughter    Colon cancer Neg Hx     Prior to Admission medications   Medication Sig Start Date End Date Taking? Authorizing Provider  aspirin  EC 81 MG tablet Take 1 tablet (81 mg total) by mouth daily. Swallow whole. 09/18/22   Patwardhan, Kaye Parsons, MD  atorvastatin  (LIPITOR ) 80 MG tablet TAKE 1 TABLET BY MOUTH ONCE DAILY AT  [facility] 01/08/24   Johnson, Megan P, DO  diazepam  (VALIUM ) 5 MG tablet Take 5 mg by mouth every 8 (eight) hours as needed (for anxiety). 05/31/16   [provider]  levothyroxine  (SYNTHROID ) 25 MCG tablet Take 1 tablet (25 mcg total) by mouth daily. 04/21/24   Johnson, Megan P, DO  losartan  (COZAAR ) 25 MG tablet Take 0.5 tablets (12.5 mg total) by mouth daily. 01/08/24 01/02/25  Terre Ferri P, DO  metoprolol  succinate (TOPROL -XL) 25 MG 24 hr tablet Take 1 tablet (25 mg total) by mouth daily. 01/08/24   Terre Ferri P, DO  mupirocin  ointment (BACTROBAN ) 2 % Apply 1 Application topically daily. 06/28/22   Moye, Virginia , MD  nitroGLYCERIN  (NITROSTAT ) 0.4 MG SL tablet PLACE ONE TABLET UNDER THE TONGUE EVERY 5 MINUTES AS NEEDED FOR CHEST PAIN 06/19/22   Terre Ferri P, DO  traMADol  (ULTRAM ) 50 MG tablet Take 1 tablet (50 mg total) by mouth every 4 (four) hours as needed. 03/15/16   Solomon Dupre, DO    Physical Exam: Vitals:   05/01/24 0915 05/01/24 0917 05/01/24 1030 05/01/24 1100  BP: (!) 186/119 (!) 162/103 (!) 184/97 (!) 183/86  Pulse: 95  63 64  Resp: 20  18 17   Temp: 97.7 F (36.5 C)     TempSrc: Oral     SpO2: 97%  96% 94%  Weight: 54.4 kg     Height: 5' 8 (1.727 m)      General:  Appears calm and comfortable and is in NAD Eyes:  PERRL, EOMI, normal lids, iris ENT:  grossly normal hearing, lips & tongue, mmm Neck:  no LAD, masses or thyromegaly Cardiovascular:   RRR, no m/r/g. No LE edema.  Respiratory:   CTA bilaterally with no wheezes/rales/rhonchi.  Normal respiratory effort. Abdomen:  soft, NT, ND; hernia braces in place Skin:  no rash or induration seen on limited exam Musculoskeletal:  grossly normal tone BUE/BLE, good ROM, no bony abnormality Psychiatric:  grossly normal mood and affect, speech fluent and appropriate, AOx3 Neurologic:  CN 2-12 grossly intact, moves all extremities in coordinated fashion  Radiological Exams on Admission: Independently reviewed - see discussion in A/P where applicable  No results found.  EKG: Independently reviewed.  Junctional rhythm with rate 77; nonspecific ST changes with no evidence of acute ischemia   Labs on Admission: I have personally reviewed the available labs and imaging studies at the time of the admission.  Pertinent labs:    Glucose 130 Unremarkable CBC   Assessment and Plan: Principal Problem:   Acute lower GI bleeding Active Problems:   CAD (coronary artery disease)   Anxiety and depression   AAA (abdominal aortic aneurysm) without rupture (HCC)   Mixed hyperlipidemia   Hypertension with heart disease   Acquired hypothyroidism   ABLA (acute blood loss anemia)    Lower GI Bleeding Most likely etiology is diverticular bleeding He has had recurrent episodes in the ER, needs CTA for localization and possible embolectomy He is afebrile at this time without tachycardia or leukocytosis; will not give antibiotics at this time.  Will place in observation  Continue to monitor for recurrent bleeding Consider GI vs. IR consult pending CTA results  ABLA Patient's lightheadedness is most likely caused by anemia secondary to lower GI bleeding.  Hgb was stable on initial check but anticipate that this has decreased given ongoing bleeding Type and screen were done in ED.   Monitor closely and follow cbc q12h, transfuse as necessary for Hbg <8 with h/o cardiac disease  CAD/AAA No  current CP Hold ASA He was reportedly taking Plavix , although I do not appreciate this on his (completed) med rec  HTN  Continue Toprol  XL Hold losartan  in the presence of concerning GI bleeding Will add prn IV hydralazine  HLD Continue atorvastatin   Anxiety Continue Valium   Hypothyroidism Continue Synthroid   Underweight Body mass index is 18.25 kg/m.Aaron Aas  Outpatient PCP/bariatric medicine f/u encouraged Significantly low or high BMI is associated with higher medical risk including morbidity and mortality       Advance Care Planning:   Code Status: Full Code - Code status was discussed with the patient and/or family at the time of admission.  The patient would want to receive full resuscitative measures at this time.   Consults: None (yet)  DVT Prophylaxis: SCDs  Family Communication: Wife was present throughout evaluation  Severity of Illness: The appropriate patient status for this patient is OBSERVATION. Observation status is judged to be reasonable and necessary in order to provide the required intensity of service to ensure the patient's safety. The patient's presenting symptoms, physical exam findings, and initial radiographic and laboratory data in the context of their medical condition is felt to place them at decreased risk for further clinical deterioration. Furthermore, it is anticipated that the patient will be medically stable for discharge from the hospital within 2 midnights of admission.   Author: Lorita Rosa, MD 05/01/2024 12:46 PM  For on call review [email or web address].

## 2024-05-01 NOTE — ED Notes (Signed)
 Pt assisted with to bedside toilet to have a BM, pt asked not to flush toilet after BM so this RN could assess if BM was bloody or not, significant amount of blood noted in toilet, Dr. Peggi Bowels made aware and looked as well.

## 2024-05-01 NOTE — ED Provider Notes (Signed)
 [facility] Provider Note    Event Date/Time   First MD Initiated Contact with Patient 05/01/24 [phone]     (approximate)   History   Rectal Bleeding   HPI  Brian [name] is a 89 y.o. male with history of coronary artery disease on aspirin , Plavix  who comes in with rectal bleeding.  Patient reports that he was at work today when he developed bright red blood per rectum.  He reports that it was just leaking out of him.  He states that he went to the bathroom and noticed that bright red blood mixed in with the stool.  He states that this started just this morning.  He denies any abdominal pain.  He does report history of inguinal hernias and I reviewed the records and saw history of a aortic aneurysm that is less than 5.5 cm so just currently being monitored.  He denies any pain in his abdomen.  He does report occasionally getting some discomfort in his inguinal hernia areas but denies any currently.  He does wear a hernia belt to help hold the medicine.  At this time patient denies any symptoms.  I reviewed a note from 04/11/2024 where patient is being treated for hypothyroidism.  Reviewed colonoscopy from 06/05/2015 where patient had a small polyp.  There is an area of large ulceration without active bleeding, diverticulosis.  During this admission patient required 6 units of blood along with FFP secondary to hypovolemic shock from lower GI bleed. Physical Exam   Triage Vital Signs: ED Triage Vitals [05/01/24 0915]  Encounter Vitals Group     BP (!) 186/119     Girls Systolic BP Percentile      Girls Diastolic BP Percentile      Boys Systolic BP Percentile      Boys Diastolic BP Percentile      Pulse Rate 95     Resp 20     Temp 97.7 F (36.5 C)     Temp Source Oral     SpO2 97 %     Weight 120 lb (54.4 kg)     Height 5' 8 (1.727 m)     Head Circumference      Peak Flow      Pain Score 0     Pain Loc      Pain Education      Exclude from Growth Chart      Most recent vital signs: Vitals:   05/01/24 0915 05/01/24 0917  BP: (!) 186/119 (!) 162/103  Pulse: 95   Resp: 20   Temp: 97.7 F (36.5 C)   SpO2: 97%      General: Awake, no distress.  CV:  Good peripheral perfusion.  Resp:  Normal effort.  Abd:  No distention.  Soft and nontender. Other:  Bright red blood per rectum   ED Results / Procedures / Treatments   Labs (all labs ordered are listed, but only abnormal results are displayed) Labs Reviewed  COMPREHENSIVE METABOLIC PANEL WITH GFR  CBC  POC OCCULT BLOOD, ED  TYPE AND SCREEN     EKG  My interpretation of EKG:  Sinus rate of 77 without any ST elevation or T wave inversions, normal intervals   PROCEDURES:  Critical Care performed: No  Procedures   MEDICATIONS ORDERED IN ED: Medications - No data to display   IMPRESSION / MDM / ASSESSMENT AND PLAN / ED COURSE  I reviewed the triage vital signs and the nursing  notes.   Patient's presentation is most consistent with acute presentation with potential threat to life or bodily function.   Patient comes in with bright red blood per rectum.  Could be secondary to diverticulosis versus hemorrhoids.  Given patient is on Plavix  and given bright red blood on examination patient will require admission for observation.  Did discuss this with patient and he felt more comfortable with this plan.  His abdomen is soft and nontender have no suspicion for AAA rupture, inguinal hernia obstruction.  He is currently hemodynamically stable.  No significant blood to suggest needing a CTA to evaluate for active bleed as I suspect that it would be negative.   CBC shows initial stable hemoglobin but given this bleed just started and he is on a blood thinner patient felt more comfortable with admission for concern for GI bleed  I was called into the room as patient had another bloody bowel movement.  Given this I will add on CT angio.  I was discussing with the hospitalist when  this bloody bowel movement had just happened.  I did discuss with them ordering the CT angio and they will follow-up on it.    The patient is on the cardiac monitor to evaluate for evidence of arrhythmia and/or significant heart rate changes.      FINAL CLINICAL IMPRESSION(S) / ED DIAGNOSES   Final diagnoses:  Lower GI bleed     Rx / DC Orders   ED Discharge Orders     None        Note:  This document was prepared using Dragon voice recognition software and may include unintentional dictation errors.   Lubertha Rush, MD 05/01/24 1049

## 2024-05-02 DIAGNOSIS — F419 Anxiety disorder, unspecified: Secondary | ICD-10-CM | POA: Diagnosis present

## 2024-05-02 DIAGNOSIS — E039 Hypothyroidism, unspecified: Secondary | ICD-10-CM | POA: Diagnosis present

## 2024-05-02 DIAGNOSIS — K5731 Diverticulosis of large intestine without perforation or abscess with bleeding: Secondary | ICD-10-CM | POA: Diagnosis not present

## 2024-05-02 DIAGNOSIS — I7143 Infrarenal abdominal aortic aneurysm, without rupture: Secondary | ICD-10-CM | POA: Diagnosis present

## 2024-05-02 DIAGNOSIS — D649 Anemia, unspecified: Secondary | ICD-10-CM

## 2024-05-02 DIAGNOSIS — Z7902 Long term (current) use of antithrombotics/antiplatelets: Secondary | ICD-10-CM | POA: Diagnosis not present

## 2024-05-02 DIAGNOSIS — D6832 Hemorrhagic disorder due to extrinsic circulating anticoagulants: Secondary | ICD-10-CM | POA: Diagnosis present

## 2024-05-02 DIAGNOSIS — Z955 Presence of coronary angioplasty implant and graft: Secondary | ICD-10-CM | POA: Diagnosis not present

## 2024-05-02 DIAGNOSIS — I2510 Atherosclerotic heart disease of native coronary artery without angina pectoris: Secondary | ICD-10-CM | POA: Diagnosis not present

## 2024-05-02 DIAGNOSIS — K922 Gastrointestinal hemorrhage, unspecified: Secondary | ICD-10-CM | POA: Diagnosis present

## 2024-05-02 DIAGNOSIS — F17210 Nicotine dependence, cigarettes, uncomplicated: Secondary | ICD-10-CM | POA: Diagnosis not present

## 2024-05-02 DIAGNOSIS — Z8719 Personal history of other diseases of the digestive system: Secondary | ICD-10-CM | POA: Diagnosis not present

## 2024-05-02 DIAGNOSIS — T45525A Adverse effect of antithrombotic drugs, initial encounter: Secondary | ICD-10-CM | POA: Diagnosis present

## 2024-05-02 DIAGNOSIS — R636 Underweight: Secondary | ICD-10-CM | POA: Diagnosis present

## 2024-05-02 DIAGNOSIS — D62 Acute posthemorrhagic anemia: Secondary | ICD-10-CM | POA: Diagnosis present

## 2024-05-02 DIAGNOSIS — Z79890 Hormone replacement therapy (postmenopausal): Secondary | ICD-10-CM | POA: Diagnosis not present

## 2024-05-02 DIAGNOSIS — K5730 Diverticulosis of large intestine without perforation or abscess without bleeding: Secondary | ICD-10-CM | POA: Diagnosis not present

## 2024-05-02 DIAGNOSIS — F32A Depression, unspecified: Secondary | ICD-10-CM | POA: Diagnosis present

## 2024-05-02 DIAGNOSIS — Z681 Body mass index (BMI) 19 or less, adult: Secondary | ICD-10-CM | POA: Diagnosis not present

## 2024-05-02 DIAGNOSIS — I5022 Chronic systolic (congestive) heart failure: Secondary | ICD-10-CM | POA: Diagnosis present

## 2024-05-02 DIAGNOSIS — E782 Mixed hyperlipidemia: Secondary | ICD-10-CM | POA: Diagnosis present

## 2024-05-02 DIAGNOSIS — I5020 Unspecified systolic (congestive) heart failure: Secondary | ICD-10-CM | POA: Diagnosis not present

## 2024-05-02 DIAGNOSIS — Z860100 Personal history of colon polyps, unspecified: Secondary | ICD-10-CM | POA: Diagnosis not present

## 2024-05-02 DIAGNOSIS — K641 Second degree hemorrhoids: Secondary | ICD-10-CM | POA: Diagnosis not present

## 2024-05-02 DIAGNOSIS — Z7982 Long term (current) use of aspirin: Secondary | ICD-10-CM | POA: Diagnosis not present

## 2024-05-02 DIAGNOSIS — K649 Unspecified hemorrhoids: Secondary | ICD-10-CM | POA: Diagnosis not present

## 2024-05-02 DIAGNOSIS — I110 Hypertensive heart disease with heart failure: Secondary | ICD-10-CM | POA: Diagnosis not present

## 2024-05-02 DIAGNOSIS — K219 Gastro-esophageal reflux disease without esophagitis: Secondary | ICD-10-CM | POA: Diagnosis present

## 2024-05-02 LAB — CBC
HCT: 31.7 % — ABNORMAL LOW (ref 39.0–52.0)
HCT: 32.9 % — ABNORMAL LOW (ref 39.0–52.0)
HCT: 32.2 % — ABNORMAL LOW (ref 39.0–52.0)
Hemoglobin: 11.1 g/dL — ABNORMAL LOW (ref 13.0–17.0)
Hemoglobin: 11.4 g/dL — ABNORMAL LOW (ref 13.0–17.0)
Hemoglobin: 10.9 g/dL — ABNORMAL LOW (ref 13.0–17.0)
MCH: 33.4 pg (ref 26.0–34.0)
MCH: 33.1 pg (ref 26.0–34.0)
MCH: 32.3 pg (ref 26.0–34.0)
MCHC: 35 g/dL (ref 30.0–36.0)
MCHC: 34.7 g/dL (ref 30.0–36.0)
MCHC: 33.9 g/dL (ref 30.0–36.0)
MCV: 95.5 fL (ref 80.0–100.0)
MCV: 95.6 fL (ref 80.0–100.0)
MCV: 95.5 fL (ref 80.0–100.0)
Platelets: 125 10*3/uL — ABNORMAL LOW (ref 150–400)
Platelets: 127 10*3/uL — ABNORMAL LOW (ref 150–400)
Platelets: 121 10*3/uL — ABNORMAL LOW (ref 150–400)
RBC: 3.32 MIL/uL — ABNORMAL LOW (ref 4.22–5.81)
RBC: 3.44 MIL/uL — ABNORMAL LOW (ref 4.22–5.81)
RBC: 3.37 MIL/uL — ABNORMAL LOW (ref 4.22–5.81)
RDW: 12.9 % (ref 11.5–15.5)
RDW: 13 % (ref 11.5–15.5)
RDW: 12.9 % (ref 11.5–15.5)
WBC: 6.2 10*3/uL (ref 4.0–10.5)
WBC: 6.2 10*3/uL (ref 4.0–10.5)
WBC: 6.2 10*3/uL (ref 4.0–10.5)
nRBC: 0 % (ref 0.0–0.2)
nRBC: 0 % (ref 0.0–0.2)
nRBC: 0 % (ref 0.0–0.2)

## 2024-05-02 LAB — BASIC METABOLIC PANEL WITH GFR
Anion gap: 5 (ref 5–15)
BUN: 9 mg/dL (ref 8–23)
CO2: 27 mmol/L (ref 22–32)
Calcium: 8.3 mg/dL — ABNORMAL LOW (ref 8.9–10.3)
Chloride: 108 mmol/L (ref 98–111)
Creatinine, Ser: 0.59 mg/dL — ABNORMAL LOW (ref 0.61–1.24)
GFR, Estimated: >60 mL/min (ref >60)
Glucose, Bld: 107 mg/dL — ABNORMAL HIGH (ref 70–99)
Potassium: 3.5 mmol/L (ref 3.5–5.1)
Sodium: 140 mmol/L (ref 135–145)

## 2024-05-02 LAB — IRON AND TIBC
Iron: 142 ug/dL (ref 45–182)
Saturation Ratios: 54 % — ABNORMAL HIGH (ref 17.9–39.5)
TIBC: 265 ug/dL (ref 250–450)
UIBC: 123 ug/dL

## 2024-05-02 LAB — PHOSPHORUS: Phosphorus: 2.8 mg/dL (ref 2.5–4.6)

## 2024-05-02 LAB — FOLATE: Folate: 9.4 ng/mL (ref >5.9)

## 2024-05-02 LAB — MAGNESIUM: Magnesium: 1.9 mg/dL (ref 1.7–2.4)

## 2024-05-02 LAB — VITAMIN D 25 HYDROXY (VIT D DEFICIENCY, FRACTURES): Vit D, 25-Hydroxy: 33.22 ng/mL (ref 30–100)

## 2024-05-02 LAB — VITAMIN B12: Vitamin B-12: 225 pg/mL (ref 180–914)

## 2024-05-02 MED ORDER — TRAZODONE HCL 50 MG PO TABS
25.0000 mg | ORAL_TABLET | Freq: Every evening | ORAL | Status: DC | PRN
  Administered 2024-05-02: 25 mg via ORAL
  Filled 2024-05-02: qty 1

## 2024-05-02 MED ORDER — CYANOCOBALAMIN 1000 MCG/ML IJ SOLN
1000.0000 ug | Freq: Every day | INTRAMUSCULAR | Status: DC
  Administered 2024-05-02 – 2024-05-05 (×4): 1000 ug via INTRAMUSCULAR
  Filled 2024-05-02 (×5): qty 1

## 2024-05-02 MED ORDER — VITAMIN B-12 1000 MCG PO TABS: 1000.0000 ug | ORAL_TABLET | Freq: Every day | ORAL | Status: DC

## 2024-05-02 MED ORDER — METOPROLOL SUCCINATE ER 25 MG PO TB24
25.0000 mg | ORAL_TABLET | Freq: Every day | ORAL | Status: DC
  Administered 2024-05-03: 25 mg via ORAL
  Filled 2024-05-02: qty 1

## 2024-05-02 MED ORDER — VITAMIN D (ERGOCALCIFEROL) 1.25 MG (50000 UNIT) PO CAPS
50000.0000 [IU] | ORAL_CAPSULE | ORAL | Status: DC
  Administered 2024-05-02: 50000 [IU] via ORAL
  Filled 2024-05-02: qty 1

## 2024-05-02 NOTE — Consult Note (Signed)
 Consultation  Referring Provider:     No ref. provider found Primary Care Physician:  Solomon Dupre, DO Primary Gastroenterologist:  Dr. Bridgett Camps         Reason for Consultation:     GI bleed  Date of Admission:  05/01/2024 Date of Consultation:  05/02/2024         HPI:   KOWEN KLUTH is a 89 y.o. male who follows with Dr. Bridgett Camps at [facility] last seen back in 07/03/2019 for reflux.It appears back in 2018 he was evaluated for iron deficiency anemia as per epic last colonoscopy in 2016 multiple small and large mouth diverticula were seen in the sigmoid colon and descending colon as well as transverse and ascending colon indicating pandiverticulosis he had had multiple polyps resected back then.  He presented to the hospital on 05/01/2024 with rectal bleeding at about 6 episodes.  He had been on Plavix .  He underwent a CT angiogram on admission that showed no active bleeding infrarenal aortic aneurysm noted 4.4 x 4.4 x 4.5 in centimeters colonic diverticulosis also noted.  Baseline hemoglobin around 14 g this morning hemoglobin 11.1 g.  Normal iron studies.  Creatinine 0.59.  No elevation in BUN.   Spoke to the patient along with his wife and daughter.  They were present in the room.  He says that all of a sudden he had about 6 episodes of bright red blood per rectum that were painless yesterday the last episode was last night she showed me pictures on the phone which confirmed the findings.  No bowel movements today.  Last dose of Plavix  was taken on 05/01/2024.  Feeling well feels hungry wants to eat.  Past Medical History:  Diagnosis Date   AAA (abdominal aortic aneurysm) (HCC)    2.9 CM infrarenal AAA on CT 11/2011   Alcoholism (HCC)    Anxiety    panic attacks, rare.   Basal cell carcinoma 06/21/2022   Right helix. 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    Gastritis 03/2012   GERD (gastroesophageal reflux disease)    GI bleed    HFrEF (heart failure with reduced ejection fraction) (HCC)    HOH (hard of hearing)    Hyperlipemia    Hypertension    Hypovolemic shock (HCC)    Kidney stones    Melanoma in situ (HCC) 06/21/2022   Right preauricular, Mohs 07/19/22   Myocardial infarction (HCC) 2015   Panic attacks    Positive H. pylori titer ?, before 2010   treated with Abx.    Squamous cell carcinoma of skin 12/28/2021   SCC IS, R medial calf superior, EDC 01/23/22   Squamous cell carcinoma of skin 12/28/2021   SCC IS, R medial calf inferior, EDC 01/23/22   Tubular adenoma of colon     Past Surgical History:  Procedure Laterality Date   CARDIAC CATHETERIZATION  2015   Stents   CATARACT EXTRACTION W/PHACO Right 09/21/2016   Procedure: CATARACT EXTRACTION PHACO AND INTRAOCULAR LENS PLACEMENT (IOC);  Surgeon: Rosa College, MD;  Location: [facility] ORS;  Service: Ophthalmology;  Laterality: Right;  US  1.10 AP% 13.8 CDE 9.93 Fluid pack lot # [id number]   CATARACT EXTRACTION W/PHACO Left 12/07/2016   Procedure: CATARACT EXTRACTION PHACO AND INTRAOCULAR LENS PLACEMENT (IOC);  Surgeon: Rosa College,  MD;  Location: [facility] ORS;  Service: Ophthalmology;  Laterality: Left;  us  00:42.7 ap 13.7 cde 5.85 lot #[id number]   COLONOSCOPY WITH PROPOFOL  Left 06/06/2015   Procedure: COLONOSCOPY WITH PROPOFOL ;  Surgeon: Stephens Eis, MD;  Location: [facility] ENDOSCOPY;  Service: Endoscopy;  Laterality: Left;   CORONARY ANGIOPLASTY     2015   LEFT HEART CATHETERIZATION WITH CORONARY ANGIOGRAM Bilateral 05/29/2014   Procedure: LEFT HEART CATHETERIZATION WITH CORONARY ANGIOGRAM;  Surgeon: Jessica Morn, MD;  Location: [facility] CATH LAB;  Service: Cardiovascular;  Laterality: Bilateral;   PERCUTANEOUS CORONARY STENT INTERVENTION (PCI-S)  05/29/2014   Procedure: PERCUTANEOUS CORONARY STENT INTERVENTION (PCI-S);  Surgeon: Jessica Morn, MD;  Location: [facility]  CATH LAB;  Service: Cardiovascular;;  DES x2 Prox and Mid LAD     Prior to Admission medications   Medication Sig Start Date End Date Taking? Authorizing Provider  aspirin  EC 81 MG tablet Take 1 tablet (81 mg total) by mouth daily. Swallow whole. 09/18/22  Yes Patwardhan, Manish J, MD  atorvastatin  (LIPITOR ) 80 MG tablet TAKE 1 TABLET BY MOUTH ONCE DAILY AT  6PM 01/08/24  Yes Johnson, Megan P, DO  diazepam  (VALIUM ) 5 MG tablet Take 5 mg by mouth every 8 (eight) hours as needed (for anxiety). 05/31/16  Yes [provider]  levothyroxine  (SYNTHROID ) 25 MCG tablet Take 1 tablet (25 mcg total) by mouth daily. 04/21/24  Yes Johnson, Megan P, DO  losartan  (COZAAR ) 25 MG tablet Take 0.5 tablets (12.5 mg total) by mouth daily. 01/08/24 01/02/25 Yes Johnson, Megan P, DO  metoprolol  succinate (TOPROL -XL) 25 MG 24 hr tablet Take 1 tablet (25 mg total) by mouth daily. 01/08/24  Yes Johnson, Megan P, DO  nitroGLYCERIN  (NITROSTAT ) 0.4 MG SL tablet PLACE ONE TABLET UNDER THE TONGUE EVERY 5 MINUTES AS NEEDED FOR CHEST PAIN 06/19/22  Yes Johnson, Megan P, DO  traMADol  (ULTRAM ) 50 MG tablet Take 1 tablet (50 mg total) by mouth every 4 (four) hours as needed. 03/15/16  Yes Johnson, Megan P, DO  mupirocin  ointment (BACTROBAN ) 2 % Apply 1 Application topically daily. 06/28/22   Moye, Virginia , MD    Family History  Problem Relation Age of Onset   Heart disease Mother    Ulcers Father        stomach    Heart disease Father    Anuerysm Brother    Leukemia Brother    Heart disease Paternal Grandmother    Heart attack Paternal Grandmother    Heart disease Paternal Grandfather    Hypertension Daughter    Colon cancer Neg Hx      Social History   Tobacco Use   Smoking status: Every Day    Current packs/day: 0.00    Average packs/day: 0.3 packs/day for 56.7 years (14.2 ttl pk-yrs)    Types: Cigarettes, Cigars    Start date: 06/1966    Last attempt to quit: 03/2023    Years since quitting: 1.1   Smokeless  tobacco: Never   Tobacco comments:    4 cigarettes per day, restarted and quit ~03/2023  Vaping Use   Vaping status: Never Used  Substance Use Topics   Alcohol use: No    Alcohol/week: 0.0 standard drinks of alcohol   Drug use: No    Allergies as of 05/01/2024 - Review Complete 05/01/2024  Allergen Reaction Noted   Zoloft  [sertraline  hcl] Nausea Only 02/11/2016    Review of Systems:    All systems reviewed and negative except where noted in  HPI.   Physical Exam:  Vital signs in last 24 hours: Temp:  [97.5 F (36.4 C)-98.2 F (36.8 C)] 97.9 F (36.6 C) (06/20 0719) Pulse Rate:  [62-79] 67 (06/20 0719) Resp:  [17-20] 19 (06/20 0719) BP: (99-184)/(64-97) 124/75 (06/20 0719) SpO2:  [94 %-99 %] 96 % (06/20 0719) Last BM Date : 05/01/24 General:   Pleasant, cooperative in NAD Head:  Normocephalic and atraumatic. Eyes:   No icterus.   Conjunctiva pink. PERRLA. Ears:  Normal auditory acuity. Neck:  Supple; no masses or thyroidomegaly Lungs: Respirations even and unlabored. Lungs clear to auscultation bilaterally.   No wheezes, crackles, or rhonchi.  Heart:  Regular rate and rhythm;  Without murmur, clicks, rubs or gallops Abdomen:  Soft, nondistended, nontender. Normal bowel sounds. No appreciable masses or hepatomegaly.  No rebound or guarding.  Neurologic:  Alert and oriented x3;  grossly normal neurologically. Skin:  Intact without significant lesions or rashes. Cervical Nodes:  No significant cervical adenopathy. Psych:  Alert and cooperative. Normal affect.  LAB RESULTS: Recent Labs    05/01/24 1518 05/01/24 2121 05/02/24 0256  WBC 7.5 8.9 6.2  HGB 13.8 13.1 11.1*  HCT 40.1 38.3* 31.7*  PLT 141* 153 125*   BMET Recent Labs    05/01/24 0918 05/02/24 0256  NA 138 140  K 3.6 3.5  CL 104 108  CO2 25 27  GLUCOSE 130* 107*  BUN 11 9  CREATININE 0.70 0.59*  CALCIUM  9.1 8.3*   LFT Recent Labs    05/01/24 0918  PROT 6.9  ALBUMIN 4.1  AST 23  ALT 19   ALKPHOS 61  BILITOT 0.6   PT/INR No results for input(s): LABPROT, INR in the last 72 hours.  STUDIES: CT Angio Abd/Pel W and/or Wo Contrast Result Date: 05/01/2024 CLINICAL DATA:  Lower GI bleed EXAM: CTA ABDOMEN AND PELVIS WITHOUT AND WITH CONTRAST TECHNIQUE: Multidetector CT imaging of the abdomen and pelvis was performed using the standard protocol during bolus administration of intravenous contrast. Multiplanar reconstructed images and MIPs were obtained and reviewed to evaluate the vascular anatomy. RADIATION DOSE REDUCTION: This exam was performed according to the departmental dose-optimization program which includes automated exposure control, adjustment of the mA and/or kV according to patient size and/or use of iterative reconstruction technique. CONTRAST:  OMNIPAQUE IOHEXOL 350 MG/ML SOLN COMPARISON:  December 13, 2011 FINDINGS: VASCULAR Aorta: Bilobed dilation of the infrarenal aorta. The more superior fusiform aneurysm measures 4.4 x 4.4 x 4.5 cm. The distal component measures 3 x 3.2 x 2.5 cm. No periaortic fluid collection or inflammation. In the superior component, there is a large amount of mural thrombus throughout the aneurysm sac. A 3.1 cm channel remains patent in the aneurysm sac. No hemodynamically significant stenosis. Celiac: Patent with a high takeoff at the diaphragmatic hiatus. 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 No acute thrombus or dissection.No hemodynamically significant stenosis. SMA: Patent without acute thrombus, aneurysm, or dissection.No hemodynamically significant stenosis. Renals: Single right renal artery with 2 left renal arteries. Patent without acute thrombus, aneurysm, or dissection.No hemodynamically significant stenosis. IMA: Patent without acute thrombus, aneurysm, or dissection.Moderate narrowing of the IMA ostium from calcified plaque. Inflow: Both common iliac arteries are  diffusely ectatic. For example, the common iliac arteries measure up to 1.7 cm on the right and 2.1 cm on the left.No hemodynamically significant stenosis. Proximal Outflow: The bilateral common femoral and visualized portions of the superficial and profunda femoral arteries  are patent without acute thrombus, aneurysm, or dissection.No hemodynamically significant stenosis. Veins: No obvious venous abnormality within the limitations of this arterial phase study. Review of the MIP images confirms the above findings. NON-VASCULAR Lower chest: No focal airspace consolidation or pleural effusion.Clustered tree-in-bud nodularity in the lateral right lower lobe. Mild centrilobular emphysema. Hepatobiliary: No mass.No radiopaque stones or wall thickening of the gallbladder.No intrahepatic or extrahepatic biliary ductal dilation. Pancreas: No mass or main ductal dilation.No peripancreatic inflammation or fluid collection. Spleen: Normal size. No mass. Adrenals/Urinary Tract: No adrenal masses. Smaller right interpolar region cyst, measuring 1.8 cm. Multiple additional small cysts are noted in both kidneys. Nonobstructive calculi in both kidneys. No hydronephrosis. The urinary bladder is distended without focal abnormality. Stomach/Bowel: The stomach is decompressed without focal abnormality. No small bowel wall thickening or inflammation. No small bowel obstruction.Normal appendix. Total colonic diverticulosis. No changes of acute diverticulitis. GI Bleed: No extravasation of contrast to suggest active GI bleeding. Lymphatic: No intraabdominal or pelvic lymphadenopathy. Reproductive: No prostatomegaly.No free pelvic fluid. Other: No pneumoperitoneum, ascites, or mesenteric inflammation. Musculoskeletal: No acute fracture or destructive lesion.Diffuse osteopenia. Multilevel degenerative disc disease of the spine. Mild-to-moderate bilateral hip osteoarthritis. IMPRESSION: VASCULAR 1. No extravasation of contrast to suggest  active GI bleeding. 2. Bilobed, fusiform aneurysm of the infrarenal aorta. The superior aneurysm is the largest component, measuring 4.4 x 4.4 x 4.5 cm. No findings to suggest rupture. 3. Diffusely ectatic common iliac arteries bilaterally measuring up to 2.1 cm on the left. NON-VASCULAR 1. Total colonic diverticulosis. No changes of acute diverticulitis. 2. Nonobstructive bilateral nephrolithiasis.  No hydronephrosis. Aortic Atherosclerosis (ICD10-I70.0). Aortic aneurysm NOS (ICD10-I71.9). Electronically Signed   By: Rance Burrows M.D.   On: 05/01/2024 12:49      Impression / Plan:   HAZEM KENNER is a 89 y.o. y/o male with a history of pancolonic diverticulosis and colon polyps presented to the emergency room with rectal bleeding.  3 g drop in hemoglobin from baseline.  No elevation in BUN/creatinine ratio.  The event occurred while on Plavix .  History of infrarenal aortic aneurysm.  No evidence of fistulous communication.  Based on the history and presentation very likely a diverticular bleed.  I explained to the patient that the options are to proceed with a colonoscopy if he is willing and the purpose would be to rule out any other causes other than a diverticular bleed.  There is nothing much we can do to treat a diverticular bleed that has resolved.  Obviously as 1 gets older the risks of any procedure with anesthesia rises hence the patient and family want to discuss with other members of the family and decide whether to proceed.  There is a possibility that the patient would like to go home tomorrow and I did explain that once they go back on Plavix  if it is other than a diverticular bleed there is always a possibility of rebleeding.  Plan 1.  Monitor CBC and transfuse as needed. 2.  If has evidence of active bleeding consider tagged RBC scan or angiogram if profuse 3.  Would consider a colonoscopy if Plavix  is held at least for 3 to 5 days.  I believe last dose of Plavix  was on 05/01/2024. 4.   They will let me know of their decision about a colonoscopy tomorrow   Thank you for involving me in the care of this patient.      LOS: 0 days   Luke Salaam, MD  05/02/2024, 9:22 AM

## 2024-05-02 NOTE — Progress Notes (Signed)
 [facility] Progress Note  Patient: Brian [name]    ZOX:[id number]  DOA: 05/01/2024     Date of Service: the patient was seen and examined on 05/02/2024  Chief Complaint  Patient presents with   Rectal Bleeding   Brief hospital course: Brian [name] is a 89 y.o. male with medical history significant of AAA, CAD, ETOH use d/o, GI bleeding, HTN, and HLD who presented on 6/19 with rectal bleeding.  He reports that he had a BM and there was blood in it about 0900.  He has up to 6 episodes since.  He still works [employer] and was at work.  He is a little light-headed when he is out of bed.  No SOB.  Last C-scope was 9 years ago and had a post-polypectomy bleed requiring transfusion.       ER Course:  Rectal bleeding on Plavix .  Started today at work.  Hgb stable.  Will order CTA.    Assessment and Plan:  Lower GI Bleeding Most likely etiology is diverticular bleeding He has had recurrent episodes in the ER, needs CTA for localization and possible embolectomy He is afebrile at this time without tachycardia or leukocytosis; will not give antibiotics at this time.  Continue to monitor for recurrent bleeding 6/20 GI consulted, recommended colonoscopy after 3 to 5 days of holding Plavix .  Most likely colonoscopy will be done on Monday if patient and family agrees Continue clear liquid diet, if no bleeding then advance to soft diet for now as per GI    ABLA Patient's lightheadedness is most likely caused by anemia secondary to lower GI bleeding.  Hgb was stable on initial check but anticipate that this has decreased given ongoing bleeding Type and screen were done in ED.   Monitor closely and follow cbc q12h, transfuse as necessary for Hbg <8 with h/o cardiac disease Iron and folate level within normal range Vitamin B12 level 225, goal >400, started vitamin B12 1000 mcg IM injection followed by oral supplement.   CAD/AAA No current CP Hold ASA He was reportedly  taking Plavix , although I do not appreciate this on his (completed) med rec   HTN  Continue Toprol  XL Hold losartan  in the presence of concerning GI bleeding Will add prn IV hydralazine   HLD Continue atorvastatin    Anxiety Continue Valium    Hypothyroidism Continue Synthroid    Body mass index is 18.25 kg/m.  Interventions:  Diet: CLD, advance to soft diet if no bleeding DVT Prophylaxis: SCD, pharmacological prophylaxis contraindicated due to GI bleeding   Advance goals of care discussion: Full code  Family Communication: family was present at bedside, at the time of interview.  The pt provided permission to discuss medical plan with the family. Opportunity was given to ask question and all questions were answered satisfactorily.   Disposition:  Pt is from Home, admitted with GI bleeidng, still has risk of bleeding and needs colonoscopy, which precludes a safe discharge. Discharge to home, when stable and cleared by GI.  Subjective: No significant events overnight, GI bleeding stopped after midnight.  Patient denied any abdominal pain, no chest pain or palpitation, no shortness of breath  Physical Exam: General: NAD, lying comfortably Appear in no distress, affect appropriate Eyes: PERRLA ENT: Oral Mucosa Clear, moist  Neck: no JVD,  Cardiovascular: S1 and S2 Present, no Murmur,  Respiratory: good respiratory effort, Bilateral Air entry equal and Decreased, no Crackles, no wheezes Abdomen: Bowel Sound present, Soft and no tenderness,  Skin: no rashes Extremities: no Pedal edema, no calf tenderness Neurologic: without any new focal findings Gait not checked due to patient safety concerns  Vitals:   05/02/24 0023 05/02/24 0350 05/02/24 0719 05/02/24 1159  BP: 99/64 105/70 124/75 135/81  Pulse: 62 78 67 68  Resp:  20 19 17   Temp: 98.2 F (36.8 C) 97.7 F (36.5 C) 97.9 F (36.6 C) 98.1 F (36.7 C)  TempSrc: Axillary Oral Oral Oral  SpO2: 94% 99% 96% 97%   Weight:      Height:        Intake/Output Summary (Last 24 hours) at 05/02/2024 1338 Last data filed at 05/02/2024 1039 Gross per 24 hour  Intake 1816.67 ml  Output --  Net 1816.67 ml   Filed Weights   05/01/24 0915  Weight: 54.4 kg    Data Reviewed: I have personally reviewed and interpreted daily labs, tele strips, imagings as discussed above. I reviewed all nursing notes, pharmacy notes, vitals, pertinent old records I have discussed plan of care as described above with RN and patient/family.  CBC: Recent Labs  Lab 05/01/24 0918 05/01/24 1518 05/01/24 2121 05/02/24 0256 05/02/24 0912  WBC 7.3 7.5 8.9 6.2 6.2  HGB 14.3 13.8 13.1 11.1* 11.4*  HCT 42.6 40.1 38.3* 31.7* 32.9*  MCV 96.2 96.6 95.0 95.5 95.6  PLT 147* 141* 153 125* 127*   Basic Metabolic Panel: Recent Labs  Lab 05/01/24 0918 05/02/24 0256  NA 138 140  K 3.6 3.5  CL 104 108  CO2 25 27  GLUCOSE 130* 107*  BUN 11 9  CREATININE 0.70 0.59*  CALCIUM  9.1 8.3*  MG  --  1.9  PHOS  --  2.8    Studies: No results found.  Scheduled Meds:  atorvastatin   80 mg Oral QPM   levothyroxine   25 mcg Oral QAC breakfast   [START ON 05/03/2024] metoprolol  succinate  25 mg Oral Daily   sodium chloride  flush  3 mL Intravenous Q12H   Continuous Infusions:  lactated ringers 100 mL/hr at 05/02/24 0650   PRN Meds: acetaminophen  **OR** acetaminophen , diazepam , hydrALAZINE, morphine  injection, ondansetron  **OR** ondansetron  (ZOFRAN ) IV  Time spent: 35 minutes  Author: Althia Atlas. MD [facility] 05/02/2024 1:38 PM  To reach On-call, see care teams to locate the attending and reach out to them via [email or web address]. If 7PM-7AM, please contact night-coverage If you still have difficulty reaching the attending provider, please page the [facility] (Director on Call) for [facility] on amion for assistance.

## 2024-05-02 NOTE — Plan of Care (Signed)
  Problem: Clinical Measurements: Goal: Ability to maintain clinical measurements within normal limits will improve Outcome: Progressing Goal: Will remain free from infection Outcome: Progressing Goal: Respiratory complications will improve Outcome: Progressing Goal: Cardiovascular complication will be avoided Outcome: Progressing   Problem: Activity: Goal: Risk for activity intolerance will decrease Outcome: Progressing   Problem: Pain Managment: Goal: General experience of comfort will improve and/or be controlled Outcome: Progressing

## 2024-05-02 NOTE — Care Management Obs Status (Signed)
 MEDICARE OBSERVATION STATUS NOTIFICATION   Patient Details  Name: Brian [name] MRN: [id number] Date of Birth: May 11, 1933   Medicare Observation Status Notification Given:  Yes    Anise Kerns 05/02/2024, 11:51 AM

## 2024-05-03 DIAGNOSIS — D649 Anemia, unspecified: Secondary | ICD-10-CM | POA: Diagnosis not present

## 2024-05-03 DIAGNOSIS — K922 Gastrointestinal hemorrhage, unspecified: Secondary | ICD-10-CM | POA: Diagnosis not present

## 2024-05-03 DIAGNOSIS — Z860100 Personal history of colon polyps, unspecified: Secondary | ICD-10-CM | POA: Diagnosis not present

## 2024-05-03 DIAGNOSIS — Z8719 Personal history of other diseases of the digestive system: Secondary | ICD-10-CM | POA: Diagnosis not present

## 2024-05-03 LAB — BASIC METABOLIC PANEL WITH GFR
Anion gap: 8 (ref 5–15)
BUN: 9 mg/dL (ref 8–23)
CO2: 22 mmol/L (ref 22–32)
Calcium: 8.3 mg/dL — ABNORMAL LOW (ref 8.9–10.3)
Chloride: 110 mmol/L (ref 98–111)
Creatinine, Ser: 0.63 mg/dL (ref 0.61–1.24)
GFR, Estimated: >60 mL/min (ref >60)
Glucose, Bld: 107 mg/dL — ABNORMAL HIGH (ref 70–99)
Potassium: 3.6 mmol/L (ref 3.5–5.1)
Sodium: 140 mmol/L (ref 135–145)

## 2024-05-03 LAB — MAGNESIUM: Magnesium: 1.8 mg/dL (ref 1.7–2.4)

## 2024-05-03 LAB — CBC
HCT: 30 % — ABNORMAL LOW (ref 39.0–52.0)
Hemoglobin: 10.1 g/dL — ABNORMAL LOW (ref 13.0–17.0)
MCH: 32.6 pg (ref 26.0–34.0)
MCHC: 33.7 g/dL (ref 30.0–36.0)
MCV: 96.8 fL (ref 80.0–100.0)
Platelets: 113 10*3/uL — ABNORMAL LOW (ref 150–400)
RBC: 3.1 MIL/uL — ABNORMAL LOW (ref 4.22–5.81)
RDW: 13.1 % (ref 11.5–15.5)
WBC: 6.4 10*3/uL (ref 4.0–10.5)
nRBC: 0 % (ref 0.0–0.2)

## 2024-05-03 LAB — PHOSPHORUS: Phosphorus: 2.7 mg/dL (ref 2.5–4.6)

## 2024-05-03 NOTE — Progress Notes (Signed)
 Brian [name] is being followed for lower GI bleed day 2 of follow up   Subjective: Do8ing well , had a bowel movement no blood and not black in color . No other complaints    Objective: Vital signs in last 24 hours: Vitals:   05/02/24 1453 05/02/24 2001 05/03/24 0336 05/03/24 0848  BP: 133/64 132/83 120/62 (!) 123/92  Pulse: 75 60 95 100  Resp: 19 18 20    Temp: 98.1 F (36.7 C) 97.9 F (36.6 C) 97.9 F (36.6 C) 97.7 F (36.5 C)  TempSrc: Oral   Oral  SpO2: 98% 98% 96% 96%  Weight:      Height:       Weight change:   Intake/Output Summary (Last 24 hours) at 05/03/2024 1133 Last data filed at 05/03/2024 0630 Gross per 24 hour  Intake 1818.25 ml  Output --  Net 1818.25 ml     Exam:  Abdomen: soft, nontender, normal bowel sounds   Lab Results: @LABTEST2 @ Micro Results: No results found for this or any previous visit (from the past 240 hours). Studies/Results: CT Angio Abd/Pel W and/or Wo Contrast Result Date: 05/01/2024 CLINICAL DATA:  Lower GI bleed EXAM: CTA ABDOMEN AND PELVIS WITHOUT AND WITH CONTRAST TECHNIQUE: Multidetector CT imaging of the abdomen and pelvis was performed using the standard protocol during bolus administration of intravenous contrast. Multiplanar reconstructed images and MIPs were obtained and reviewed to evaluate the vascular anatomy. RADIATION DOSE REDUCTION: This exam was performed according to the departmental dose-optimization program which includes automated exposure control, adjustment of the mA and/or kV according to patient size and/or use of iterative reconstruction technique. CONTRAST:  OMNIPAQUE  IOHEXOL  350 MG/ML SOLN COMPARISON:  December 13, 2011 FINDINGS: VASCULAR Aorta: Bilobed dilation of the infrarenal aorta. The more superior fusiform aneurysm measures 4.4 x 4.4 x 4.5 cm. The distal component measures 3 x 3.2 x 2.5 cm. No periaortic fluid collection or inflammation. In the superior component, there is a large amount of mural  thrombus throughout the aneurysm sac. A 3.1 cm channel remains patent in the aneurysm sac. No hemodynamically significant stenosis. Celiac: Patent with a high takeoff at the diaphragmatic hiatus. 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 No acute thrombus or dissection.No hemodynamically significant stenosis. SMA: Patent without acute thrombus, aneurysm, or dissection.No hemodynamically significant stenosis. Renals: Single right renal artery with 2 left renal arteries. Patent without acute thrombus, aneurysm, or dissection.No hemodynamically significant stenosis. IMA: Patent without acute thrombus, aneurysm, or dissection.Moderate narrowing of the IMA ostium from calcified plaque. Inflow: Both common iliac arteries are diffusely ectatic. For example, the common iliac arteries measure up to 1.7 cm on the right and 2.1 cm on the left.No hemodynamically significant stenosis. Proximal Outflow: The bilateral common femoral and visualized portions of the superficial and profunda femoral arteries are patent without acute thrombus, aneurysm, or dissection.No hemodynamically significant stenosis. Veins: No obvious venous abnormality within the limitations of this arterial phase study. Review of the MIP images confirms the above findings. NON-VASCULAR Lower chest: No focal airspace consolidation or pleural effusion.Clustered tree-in-bud nodularity in the lateral right lower lobe. Mild centrilobular emphysema. Hepatobiliary: No mass.No radiopaque stones or wall thickening of the gallbladder.No intrahepatic or extrahepatic biliary ductal dilation. Pancreas: No mass or main ductal dilation.No peripancreatic inflammation or fluid collection. Spleen: Normal size. No mass. Adrenals/Urinary Tract: No adrenal masses. Smaller right interpolar region cyst, measuring 1.8 cm. Multiple additional small cysts are noted in both kidneys.  Nonobstructive calculi in both kidneys. No  hydronephrosis. The urinary bladder is distended without focal abnormality. Stomach/Bowel: The stomach is decompressed without focal abnormality. No small bowel wall thickening or inflammation. No small bowel obstruction.Normal appendix. Total colonic diverticulosis. No changes of acute diverticulitis. GI Bleed: No extravasation of contrast to suggest active GI bleeding. Lymphatic: No intraabdominal or pelvic lymphadenopathy. Reproductive: No prostatomegaly.No free pelvic fluid. Other: No pneumoperitoneum, ascites, or mesenteric inflammation. Musculoskeletal: No acute fracture or destructive lesion.Diffuse osteopenia. Multilevel degenerative disc disease of the spine. Mild-to-moderate bilateral hip osteoarthritis. IMPRESSION: VASCULAR 1. No extravasation of contrast to suggest active GI bleeding. 2. Bilobed, fusiform aneurysm of the infrarenal aorta. The superior aneurysm is the largest component, measuring 4.4 x 4.4 x 4.5 cm. No findings to suggest rupture. 3. Diffusely ectatic common iliac arteries bilaterally measuring up to 2.1 cm on the left. NON-VASCULAR 1. Total colonic diverticulosis. No changes of acute diverticulitis. 2. Nonobstructive bilateral nephrolithiasis.  No hydronephrosis. Aortic Atherosclerosis (ICD10-I70.0). Aortic aneurysm NOS (ICD10-I71.9). Electronically Signed   By: Rogelia Myers M.D.   On: 05/01/2024 12:49   Medications: I have reviewed the patient's current medications. Scheduled Meds:  atorvastatin   80 mg Oral QPM   cyanocobalamin   1,000 mcg Intramuscular Daily   Followed by   NOREEN ON 05/09/2024] vitamin B-12  1,000 mcg Oral Daily   levothyroxine   25 mcg Oral QAC breakfast   metoprolol  succinate  25 mg Oral Daily   sodium chloride  flush  3 mL Intravenous Q12H   Vitamin D  (Ergocalciferol )  50,000 Units Oral Q7 days   Continuous Infusions:  lactated ringers  100 mL/hr at 05/03/24 0337   PRN Meds:.acetaminophen  **OR** acetaminophen , diazepam , hydrALAZINE , morphine   injection, ondansetron  **OR** ondansetron  (ZOFRAN ) IV, traZODone    Assessment: Principal Problem:   Lower GI bleed Active Problems:   Anxiety and depression   AAA (abdominal aortic aneurysm) without rupture (HCC)   CAD (coronary artery disease)   Mixed hyperlipidemia   Hypertension with heart disease   Acquired hypothyroidism   ABLA (acute blood loss anemia)   Lower GI bleeding   Has been admitted with lower GI bleed with prior history of pancolonic diverticulosis and colon polyps.  3 to 4 g drop in hemoglobin from baseline no elevated and BUN/creatinine ratio.  The patient had been on Plavix  last dose taken on 04/30/2024.  Clinically the bleeding has stopped and I have discussed extensively with the patient and family about options as documented in my note yesterday.  CT angiogram on admission was negative for active bleeding.  Plan 1.  Monitor CBC transfuse as needed 2.  Plavix  has been held and I offered to perform a colonoscopy on Sunday ie tomorrow as family agree to proceed with procedure since pt may need to go back on Plavix   - spoke to patient and daughters, wife over the phone and in person , Daughter ex GI nurse. Patient wants to eat today and prep tomorrow and have procedure on Monday . Clinically stable. No further bleeding. Will plan for colonoscopy on Monday .   LOS: 1 day   Ruel Kung, MD 05/03/2024, 11:33 AM

## 2024-05-03 NOTE — Progress Notes (Signed)
 [facility] Progress Note  Patient: Brian [name]    FMW:[id number]  DOA: 05/01/2024     Date of Service: the patient was seen and examined on 05/03/2024  Chief Complaint  Patient presents with   Rectal Bleeding   Brief hospital course: Brian [name] is a 89 y.o. male with medical history significant of AAA, CAD, ETOH use d/o, GI bleeding, HTN, and HLD who presented on 6/19 with rectal bleeding.  He reports that he had a BM and there was blood in it about 0900.  He has up to 6 episodes since.  He still works [employer] and was at work.  He is a little light-headed when he is out of bed.  No SOB.  Last C-scope was 9 years ago and had a post-polypectomy bleed requiring transfusion.       ER Course:  Rectal bleeding on Plavix .  Started today at work.  Hgb stable.  Will order CTA.    Assessment and Plan:  Lower GI Bleeding Most likely etiology is diverticular bleeding Patient had prior history of lower GI bleeding secondary to polyps and second episode happened after polypectomy due to slippage of clip and after that no GI bleeding until this time. He is afebrile at this time without tachycardia or leukocytosis; will not give antibiotics at this time.  Continue to monitor for recurrent bleeding Patient is on Plavix , last dose was on Wednesday, 04/30/2024 6/20 GI consulted, recommended colonoscopy after 3 to 5 days of holding Plavix .  Most likely colonoscopy will be done on Monday if patient and family agrees S/p Clear liquid diet, advanced to soft diet, no active bleeding.  H&H remained stable     ABLA Patient's lightheadedness is most likely caused by anemia secondary to lower GI bleeding.  Hgb was stable on initial check but anticipate that this has decreased given ongoing bleeding Type and screen were done in ED.   Monitor closely and follow cbc q12h, transfuse as necessary for Hbg <8 with h/o cardiac disease Iron and folate level within normal range Vitamin B12  level 225, goal >400, started vitamin B12 1000 mcg IM injection followed by oral supplement.   CAD/AAA No current CP Hold ASA He was reportedly taking Plavix , although I do not appreciate this on his (completed) med rec   HTN  Continue Toprol  XL Hold losartan  in the presence of concerning GI bleeding Will add prn IV hydralazine    HLD Continue atorvastatin    Anxiety Continue Valium    Hypothyroidism Continue Synthroid    Body mass index is 18.25 kg/m.  Interventions:  Diet: CLD, advance to soft diet if no bleeding DVT Prophylaxis: SCD, pharmacological prophylaxis contraindicated due to GI bleeding   Advance goals of care discussion: Full code  Family Communication: family was present at bedside, at the time of interview.  The pt provided permission to discuss medical plan with the family. Opportunity was given to ask question and all questions were answered satisfactorily.   Disposition:  Pt is from Home, admitted with GI bleeidng, still has risk of bleeding and needs colonoscopy, which precludes a safe discharge. Discharge to home, when stable and cleared by GI. Colonoscopy on Monday 6/23  Subjective: No significant events overnight, patient had 2 bowel movements, no bleeding noticed. Denied any complaints.   Physical Exam: General: NAD, lying comfortably Appear in no distress, affect appropriate Eyes: PERRLA ENT: Oral Mucosa Clear, moist  Neck: no JVD,  Cardiovascular: S1 and S2 Present, no Murmur,  Respiratory: good  respiratory effort, Bilateral Air entry equal and Decreased, no Crackles, no wheezes Abdomen: Bowel Sound present, Soft and no tenderness,  Skin: no rashes Extremities: no Pedal edema, no calf tenderness Neurologic: without any new focal findings Gait not checked due to patient safety concerns  Vitals:   05/02/24 2001 05/03/24 0336 05/03/24 0848 05/03/24 1234  BP: 132/83 120/62 (!) 123/92 109/62  Pulse: 60 95 100 61  Resp: 18 20    Temp: 97.9  F (36.6 C) 97.9 F (36.6 C) 97.7 F (36.5 C) 98.1 F (36.7 C)  TempSrc:   Oral   SpO2: 98% 96% 96% 96%  Weight:      Height:        Intake/Output Summary (Last 24 hours) at 05/03/2024 1513 Last data filed at 05/03/2024 0630 Gross per 24 hour  Intake 1638.25 ml  Output --  Net 1638.25 ml   Filed Weights   05/01/24 0915  Weight: 54.4 kg    Data Reviewed: I have personally reviewed and interpreted daily labs, tele strips, imagings as discussed above. I reviewed all nursing notes, pharmacy notes, vitals, pertinent old records I have discussed plan of care as described above with RN and patient/family.  CBC: Recent Labs  Lab 05/01/24 2121 05/02/24 0256 05/02/24 0912 05/02/24 1515 05/03/24 0411  WBC 8.9 6.2 6.2 6.2 6.4  HGB 13.1 11.1* 11.4* 10.9* 10.1*  HCT 38.3* 31.7* 32.9* 32.2* 30.0*  MCV 95.0 95.5 95.6 95.5 96.8  PLT 153 125* 127* 121* 113*   Basic Metabolic Panel: Recent Labs  Lab 05/01/24 0918 05/02/24 0256 05/03/24 0411  NA 138 140 140  K 3.6 3.5 3.6  CL 104 108 110  CO2 25 27 22   GLUCOSE 130* 107* 107*  BUN 11 9 9   CREATININE 0.70 0.59* 0.63  CALCIUM  9.1 8.3* 8.3*  MG  --  1.9 1.8  PHOS  --  2.8 2.7    Studies: No results found.  Scheduled Meds:  atorvastatin   80 mg Oral QPM   cyanocobalamin   1,000 mcg Intramuscular Daily   Followed by   NOREEN ON 05/09/2024] vitamin B-12  1,000 mcg Oral Daily   levothyroxine   25 mcg Oral QAC breakfast   metoprolol  succinate  25 mg Oral Daily   sodium chloride  flush  3 mL Intravenous Q12H   Vitamin D  (Ergocalciferol )  50,000 Units Oral Q7 days   Continuous Infusions:  lactated ringers  100 mL/hr at 05/03/24 1330   PRN Meds: acetaminophen  **OR** acetaminophen , diazepam , hydrALAZINE , morphine  injection, ondansetron  **OR** ondansetron  (ZOFRAN ) IV, traZODone   Time spent: 35 minutes  Author: ELVAN [name]. MD [facility] 05/03/2024 3:13 PM  To reach On-call, see care teams to locate the attending and  reach out to them via [email or web address]. If 7PM-7AM, please contact night-coverage If you still have difficulty reaching the attending provider, please page the [facility] (Director on Call) for [facility] on amion for assistance.

## 2024-05-03 NOTE — Plan of Care (Signed)
  Problem: Clinical Measurements: Goal: Ability to maintain clinical measurements within normal limits will improve Outcome: Progressing Goal: Will remain free from infection Outcome: Progressing Goal: Diagnostic test results will improve Outcome: Progressing Goal: Respiratory complications will improve Outcome: Progressing Goal: Cardiovascular complication will be avoided Outcome: Progressing   Problem: Activity: Goal: Risk for activity intolerance will decrease Outcome: Progressing   Problem: Pain Managment: Goal: General experience of comfort will improve and/or be controlled Outcome: Progressing

## 2024-05-04 DIAGNOSIS — Z860100 Personal history of colon polyps, unspecified: Secondary | ICD-10-CM | POA: Diagnosis not present

## 2024-05-04 DIAGNOSIS — Z8719 Personal history of other diseases of the digestive system: Secondary | ICD-10-CM | POA: Diagnosis not present

## 2024-05-04 DIAGNOSIS — D649 Anemia, unspecified: Secondary | ICD-10-CM | POA: Diagnosis not present

## 2024-05-04 DIAGNOSIS — K922 Gastrointestinal hemorrhage, unspecified: Secondary | ICD-10-CM | POA: Diagnosis not present

## 2024-05-04 LAB — PHOSPHORUS: Phosphorus: 3.1 mg/dL (ref 2.5–4.6)

## 2024-05-04 LAB — CBC
HCT: 29.2 % — ABNORMAL LOW (ref 39.0–52.0)
Hemoglobin: 9.4 g/dL — ABNORMAL LOW (ref 13.0–17.0)
MCH: 31.6 pg (ref 26.0–34.0)
MCHC: 32.2 g/dL (ref 30.0–36.0)
MCV: 98.3 fL (ref 80.0–100.0)
Platelets: 108 10*3/uL — ABNORMAL LOW (ref 150–400)
RBC: 2.97 MIL/uL — ABNORMAL LOW (ref 4.22–5.81)
RDW: 13 % (ref 11.5–15.5)
WBC: 6.5 10*3/uL (ref 4.0–10.5)
nRBC: 0 % (ref 0.0–0.2)

## 2024-05-04 LAB — MAGNESIUM: Magnesium: 1.7 mg/dL (ref 1.7–2.4)

## 2024-05-04 LAB — BASIC METABOLIC PANEL WITH GFR
Anion gap: 4 — ABNORMAL LOW (ref 5–15)
BUN: 9 mg/dL (ref 8–23)
CO2: 26 mmol/L (ref 22–32)
Calcium: 8.2 mg/dL — ABNORMAL LOW (ref 8.9–10.3)
Chloride: 108 mmol/L (ref 98–111)
Creatinine, Ser: 0.71 mg/dL (ref 0.61–1.24)
GFR, Estimated: >60 mL/min (ref >60)
Glucose, Bld: 100 mg/dL — ABNORMAL HIGH (ref 70–99)
Potassium: 3.3 mmol/L — ABNORMAL LOW (ref 3.5–5.1)
Sodium: 138 mmol/L (ref 135–145)

## 2024-05-04 MED ORDER — SODIUM CHLORIDE 0.9 % IV SOLN
INTRAVENOUS | Status: DC
  Administered 2024-05-04: 10 mL via INTRAVENOUS

## 2024-05-04 MED ORDER — PEG 3350-KCL-NA BICARB-NACL 420 G PO SOLR
2000.0000 mL | Freq: Once | ORAL | Status: AC
  Administered 2024-05-04: 2000 mL via ORAL

## 2024-05-04 MED ORDER — PEG 3350-KCL-NA BICARB-NACL 420 G PO SOLR
2000.0000 mL | Freq: Once | ORAL | Status: AC
  Administered 2024-05-04: 2000 mL via ORAL
  Filled 2024-05-04: qty 4000

## 2024-05-04 MED ORDER — METOPROLOL SUCCINATE ER 25 MG PO TB24: 25.0000 mg | ORAL_TABLET | Freq: Every day | ORAL | Status: DC

## 2024-05-04 MED ORDER — POTASSIUM CHLORIDE CRYS ER 20 MEQ PO TBCR
40.0000 meq | EXTENDED_RELEASE_TABLET | Freq: Once | ORAL | Status: AC
  Administered 2024-05-04: 40 meq via ORAL
  Filled 2024-05-04: qty 2

## 2024-05-04 NOTE — Progress Notes (Signed)
 [facility] Progress Note  Patient: Brian [name]    FMW:[id number]  DOA: 05/01/2024     Date of Service: the patient was seen and examined on 05/04/2024  Chief Complaint  Patient presents with   Rectal Bleeding   Brief hospital course: EIDEN BAGOT is a 89 y.o. male with medical history significant of AAA, CAD, ETOH use d/o, GI bleeding, HTN, and HLD who presented on 6/19 with rectal bleeding.  He reports that he had a BM and there was blood in it about 0900.  He has up to 6 episodes since.  He still works [employer] and was at work.  He is a little light-headed when he is out of bed.  No SOB.  Last C-scope was 9 years ago and had a post-polypectomy bleed requiring transfusion.       ER Course:  Rectal bleeding on Plavix .  Started today at work.  Hgb stable.  Will order CTA.    Assessment and Plan:  Lower GI Bleeding Most likely etiology is diverticular bleeding Patient had prior history of lower GI bleeding secondary to polyps and second episode happened after polypectomy due to slippage of clip and after that no GI bleeding until this time. He is afebrile at this time without tachycardia or leukocytosis; will not give antibiotics at this time.  Continue to monitor for recurrent bleeding Patient is on Plavix , last dose was on Wednesday, 04/30/2024 6/20 GI consulted, recommended colonoscopy after 3 to 5 days of holding Plavix .  So colonoscopy has been scheduled to be done on Monday S/p Clear liquid diet, advanced to soft diet, no active bleeding.  6/22 no active bleeding, Hb 9.4 slightly low, patient is scheduled for colonoscopy tomorrow a.m. Keep n.p.o. after midnight    ABLA Patient's lightheadedness is most likely caused by anemia secondary to lower GI bleeding.  Hgb was stable on initial check but anticipate that this has decreased given ongoing bleeding Type and screen were done in ED.   Monitor closely and follow cbc q12h, transfuse as necessary for Hbg <8  with h/o cardiac disease Iron and folate level within normal range Vitamin B12 level 225, goal >400, started vitamin B12 1000 mcg IM injection followed by oral supplement.   CAD/AAA No current CP Hold ASA He was reportedly taking Plavix , although I do not appreciate this on his (completed) med rec   HTN  Continue Toprol  XL Hold losartan  in the presence of concerning GI bleeding Will add prn IV hydralazine    HLD Continue atorvastatin    Anxiety Continue Valium    Hypothyroidism Continue Synthroid   Vitamin D  level 33, at lower end, started vitamin D  supplement to prevent deficiency   Body mass index is 18.25 kg/m.  Interventions:  Diet: CLD, advance to soft diet if no bleeding DVT Prophylaxis: SCD, pharmacological prophylaxis contraindicated due to GI bleeding   Advance goals of care discussion: Full code  Family Communication: family was present at bedside, at the time of interview.  The pt provided permission to discuss medical plan with the family. Opportunity was given to ask question and all questions were answered satisfactorily.   Disposition:  Pt is from Home, admitted with GI bleeidng, still has risk of bleeding and needs colonoscopy, which precludes a safe discharge. Discharge to home, when stable and cleared by GI. Colonoscopy on Monday 6/23  Subjective: No significant events overnight, patient denied any GI bleeding, no abdominal pain, no nausea vomiting. Patient is aware that he is scheduled to start  GoLytely in the evening and colonoscopy will be done tomorrow a.m.    Physical Exam: General: NAD, lying comfortably Appear in no distress, affect appropriate Eyes: PERRLA ENT: Oral Mucosa Clear, moist  Neck: no JVD,  Cardiovascular: S1 and S2 Present, no Murmur,  Respiratory: good respiratory effort, Bilateral Air entry equal and Decreased, no Crackles, no wheezes Abdomen: Bowel Sound present, Soft and no tenderness,  Skin: no rashes Extremities: no  Pedal edema, no calf tenderness Neurologic: without any new focal findings Gait not checked due to patient safety concerns  Vitals:   05/03/24 2321 05/04/24 0447 05/04/24 0758 05/04/24 1200  BP: 115/64 103/63 123/64 122/76  Pulse: 69 62 (!) 57 63  Resp: 20 18 19 19   Temp: 97.8 F (36.6 C) 98.2 F (36.8 C) 97.8 F (36.6 C) 98.6 F (37 C)  TempSrc: Oral     SpO2: 91% 94% 95% 96%  Weight:      Height:        Intake/Output Summary (Last 24 hours) at 05/04/2024 1444 Last data filed at 05/04/2024 0900 Gross per 24 hour  Intake 2961.14 ml  Output --  Net 2961.14 ml   Filed Weights   05/01/24 0915  Weight: 54.4 kg    Data Reviewed: I have personally reviewed and interpreted daily labs, tele strips, imagings as discussed above. I reviewed all nursing notes, pharmacy notes, vitals, pertinent old records I have discussed plan of care as described above with RN and patient/family.  CBC: Recent Labs  Lab 05/02/24 0256 05/02/24 0912 05/02/24 1515 05/03/24 0411 05/04/24 0537  WBC 6.2 6.2 6.2 6.4 6.5  HGB 11.1* 11.4* 10.9* 10.1* 9.4*  HCT 31.7* 32.9* 32.2* 30.0* 29.2*  MCV 95.5 95.6 95.5 96.8 98.3  PLT 125* 127* 121* 113* 108*   Basic Metabolic Panel: Recent Labs  Lab 05/01/24 0918 05/02/24 0256 05/03/24 0411 05/04/24 0537  NA 138 140 140 138  K 3.6 3.5 3.6 3.3*  CL 104 108 110 108  CO2 25 27 22 26   GLUCOSE 130* 107* 107* 100*  BUN 11 9 9 9   CREATININE 0.70 0.59* 0.63 0.71  CALCIUM  9.1 8.3* 8.3* 8.2*  MG  --  1.9 1.8 1.7  PHOS  --  2.8 2.7 3.1    Studies: No results found.  Scheduled Meds:  atorvastatin   80 mg Oral QPM   cyanocobalamin   1,000 mcg Intramuscular Daily   Followed by   NOREEN ON 05/09/2024] vitamin B-12  1,000 mcg Oral Daily   levothyroxine   25 mcg Oral QAC breakfast   [START ON 05/05/2024] metoprolol  succinate  25 mg Oral Daily   sodium chloride  flush  3 mL Intravenous Q12H   Vitamin D  (Ergocalciferol )  50,000 Units Oral Q7 days   Continuous  Infusions:   PRN Meds: acetaminophen  **OR** acetaminophen , diazepam , hydrALAZINE , morphine  injection, ondansetron  **OR** ondansetron  (ZOFRAN ) IV, traZODone   Time spent: 35 minutes  Author: ELVAN SOR. MD [facility] 05/04/2024 2:44 PM  To reach On-call, see care teams to locate the attending and reach out to them via [email or web address]. If 7PM-7AM, please contact night-coverage If you still have difficulty reaching the attending provider, please page the [facility] (Director on Call) for [facility] on amion for assistance.

## 2024-05-04 NOTE — Progress Notes (Signed)
 GI follow-up note  Brian [name] is being followed for lower GI bleed day day 2 of follow up   Subjective: No further bowel movements no further bleeding   Objective: Vital signs in last 24 hours: Vitals:   05/03/24 2321 05/04/24 0447 05/04/24 0758 05/04/24 1200  BP: 115/64 103/63 123/64 122/76  Pulse: 69 62 (!) 57 63  Resp: 20 18 19 19   Temp: 97.8 F (36.6 C) 98.2 F (36.8 C) 97.8 F (36.6 C) 98.6 F (37 C)  TempSrc: Oral     SpO2: 91% 94% 95% 96%  Weight:      Height:       Weight change:   Intake/Output Summary (Last 24 hours) at 05/04/2024 1556 Last data filed at 05/04/2024 0900 Gross per 24 hour  Intake 2021.45 ml  Output --  Net 2021.45 ml     Exam:  Abdomen: soft, nontender, normal bowel sounds   Lab Results: @LABTEST2 @ Micro Results: No results found for this or any previous visit (from the past 240 hours). Studies/Results: No results found. Medications: I have reviewed the patient's current medications. Scheduled Meds:  atorvastatin   80 mg Oral QPM   cyanocobalamin   1,000 mcg Intramuscular Daily   Followed by   NOREEN ON 05/09/2024] vitamin B-12  1,000 mcg Oral Daily   levothyroxine   25 mcg Oral QAC breakfast   [START ON 05/06/2024] metoprolol  succinate  25 mg Oral Daily   polyethylene glycol-electrolytes  2,000 mL Oral Once   Followed by   polyethylene glycol-electrolytes  2,000 mL Oral Once   sodium chloride  flush  3 mL Intravenous Q12H   Vitamin D  (Ergocalciferol )  50,000 Units Oral Q7 days   Continuous Infusions:  sodium chloride      PRN Meds:.acetaminophen  **OR** acetaminophen , diazepam , hydrALAZINE , morphine  injection, ondansetron  **OR** ondansetron  (ZOFRAN ) IV, traZODone    Assessment: Principal Problem:   Lower GI bleed Active Problems:   Anxiety and depression   AAA (abdominal aortic aneurysm) without rupture (HCC)   CAD (coronary artery disease)   Mixed hyperlipidemia   Hypertension with heart disease   Acquired  hypothyroidism   ABLA (acute blood loss anemia)   Lower GI bleeding   Has been admitted with lower GI bleed with prior history of pancolonic diverticulosis and colon polyps.  3 to 4 g drop in hemoglobin from baseline no elevated and BUN/creatinine ratio.  The patient had been on Plavix  last dose taken on 04/30/2024.  Clinically the bleeding has stopped and I have discussed extensively with the patient and family about options as documented in my note yesterday.  CT angiogram on admission was negative for active bleeding.   Plan 1.  Monitor CBC transfuse as needed 2.  Plavix  has been held and I offered to perform a colonoscopy on Sunday i since  family agree to proceed with procedure since pt may need to go back on Plavix   - spoke to patient and daughters, wife over the phone and in person on Saturday, Daughter ex GI nurse.  Patient wanted to have the procedure on Monday.  Recommend to place clips in case any polyps were resected since he had a significant post polypectomy bleed some years back after a colonoscopy with a similar presentation.    I have discussed alternative options, risks & benefits,  which include, but are not limited to, bleeding, infection, perforation,respiratory complication & drug reaction.  The patient agrees with this plan & written consent will be obtained.      LOS: 2 days  Ruel Kung, MD 05/04/2024, 3:56 PM

## 2024-05-04 NOTE — Care Management Important Message (Signed)
 Important Message  Patient Details  Name: Brian [name] MRN: [id number] Date of Birth: [birth year]   Important Message Given:  Yes - Medicare IM     Rojelio SHAUNNA Rattler 05/04/2024, 2:31 PM

## 2024-05-04 NOTE — Plan of Care (Signed)

## 2024-05-04 NOTE — Plan of Care (Signed)

## 2024-05-05 ENCOUNTER — Other Ambulatory Visit: Payer: Self-pay

## 2024-05-05 ENCOUNTER — Inpatient Hospital Stay: Admitting: Anesthesiology

## 2024-05-05 ENCOUNTER — Encounter: Payer: Self-pay | Admitting: Student

## 2024-05-05 ENCOUNTER — Encounter
Admission: EM | Disposition: A | Discharge status: Home / Self Care | Payer: Self-pay | Source: Home / Self Care | Attending: Student

## 2024-05-05 DIAGNOSIS — K5730 Diverticulosis of large intestine without perforation or abscess without bleeding: Secondary | ICD-10-CM | POA: Diagnosis not present

## 2024-05-05 DIAGNOSIS — K641 Second degree hemorrhoids: Secondary | ICD-10-CM | POA: Diagnosis not present

## 2024-05-05 DIAGNOSIS — K922 Gastrointestinal hemorrhage, unspecified: Secondary | ICD-10-CM | POA: Diagnosis not present

## 2024-05-05 HISTORY — PX: COLONOSCOPY: SHX5424

## 2024-05-05 LAB — CBC
HCT: 28 % — ABNORMAL LOW (ref 39.0–52.0)
Hemoglobin: 9.6 g/dL — ABNORMAL LOW (ref 13.0–17.0)
MCH: 32.9 pg (ref 26.0–34.0)
MCHC: 34.3 g/dL (ref 30.0–36.0)
MCV: 95.9 fL (ref 80.0–100.0)
Platelets: 115 10*3/uL — ABNORMAL LOW (ref 150–400)
RBC: 2.92 MIL/uL — ABNORMAL LOW (ref 4.22–5.81)
RDW: 13 % (ref 11.5–15.5)
WBC: 5.5 10*3/uL (ref 4.0–10.5)
nRBC: 0 % (ref 0.0–0.2)

## 2024-05-05 LAB — BASIC METABOLIC PANEL WITH GFR
Anion gap: 6 (ref 5–15)
BUN: 9 mg/dL (ref 8–23)
CO2: 23 mmol/L (ref 22–32)
Calcium: 8.4 mg/dL — ABNORMAL LOW (ref 8.9–10.3)
Chloride: 110 mmol/L (ref 98–111)
Creatinine, Ser: 0.61 mg/dL (ref 0.61–1.24)
GFR, Estimated: >60 mL/min (ref >60)
Glucose, Bld: 81 mg/dL (ref 70–99)
Potassium: 3.6 mmol/L (ref 3.5–5.1)
Sodium: 139 mmol/L (ref 135–145)

## 2024-05-05 LAB — MAGNESIUM: Magnesium: 1.7 mg/dL (ref 1.7–2.4)

## 2024-05-05 LAB — PHOSPHORUS: Phosphorus: 3.1 mg/dL (ref 2.5–4.6)

## 2024-05-05 SURGERY — COLONOSCOPY
Anesthesia: General

## 2024-05-05 MED ORDER — PROPOFOL 500 MG/50ML IV EMUL
INTRAVENOUS | Status: DC | PRN
  Administered 2024-05-05: 50 mg via INTRAVENOUS
  Administered 2024-05-05: 100 ug/kg/min via INTRAVENOUS

## 2024-05-05 MED ORDER — CYANOCOBALAMIN 1000 MCG PO TABS
1000.0000 ug | ORAL_TABLET | Freq: Every day | ORAL | 2 refills | Status: AC
  Filled 2024-05-05: qty 30, 30d supply, fill #0

## 2024-05-05 MED ORDER — VITAMIN D (ERGOCALCIFEROL) 1.25 MG (50000 UNIT) PO CAPS
50000.0000 [IU] | ORAL_CAPSULE | ORAL | 0 refills | Status: DC
  Filled 2024-05-05: qty 12, 84d supply, fill #0

## 2024-05-05 NOTE — Anesthesia Preprocedure Evaluation (Signed)
 Anesthesia Evaluation  Patient identified by MRN, date of birth, ID band Patient awake    Reviewed: Allergy & Precautions, NPO status , Patient's Chart, lab work & pertinent test results  History of Anesthesia Complications Negative for: history of anesthetic complications  Airway Mallampati: II  TM Distance: >3 FB Neck ROM: Full    Dental  (+) Partial Upper   Pulmonary neg sleep apnea, neg COPD, Current SmokerPatient did not abstain from smoking.   Pulmonary exam normal breath sounds clear to auscultation       Cardiovascular Exercise Tolerance: Good METShypertension, Pt. on medications + CAD, + Past MI and + Cardiac Stents  (-) dysrhythmias  Rhythm:Regular Rate:Normal - Systolic murmurs    Neuro/Psych  PSYCHIATRIC DISORDERS Anxiety Depression    negative neurological ROS     GI/Hepatic ,GERD  ,,(+)     (-) substance abuse  Denies N/V   Endo/Other  neg diabetesHypothyroidism    Renal/GU negative Renal ROS     Musculoskeletal   Abdominal   Peds  Hematology   Anesthesia Other Findings Past Medical History: No date: AAA (abdominal aortic aneurysm) (HCC)     Comment:  2.9 CM infrarenal AAA on CT 11/2011 No date: Alcoholism (HCC) No date: Anxiety     Comment:  panic attacks, rare. 06/21/2022: Basal cell carcinoma     Comment:  Right helix. 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  03/2012: Gastritis No date: GERD (gastroesophageal reflux disease) No date: GI bleed No date: HFrEF (heart failure with reduced ejection fraction) (HCC) No date: HOH (hard of hearing) No date: Hyperlipemia No date: Hypertension No date: Hypovolemic shock (HCC) No date: Kidney stones 06/21/2022: Melanoma in situ [facility])     Comment:  Right preauricular, Mohs  07/19/22 2015: Myocardial infarction (HCC) No date: Panic attacks ?, before 2010: Positive H. pylori titer     Comment:  treated with Abx.  12/28/2021: Squamous cell carcinoma of skin     Comment:  SCC IS, R medial calf superior, [facility] 01/23/22 12/28/2021: Squamous cell carcinoma of skin     Comment:  SCC IS, R medial calf inferior, [facility] 01/23/22 No date: Tubular adenoma of colon  Reproductive/Obstetrics                             Anesthesia Physical Anesthesia Plan  ASA: 3  Anesthesia Plan: General   Post-op Pain Management: Minimal or no pain anticipated   Induction: Intravenous  PONV Risk Score and Plan: 1 and Propofol  infusion, TIVA and Ondansetron   Airway Management Planned: Nasal Cannula  Additional Equipment: None  Intra-op Plan:   Post-operative Plan:   Informed Consent: I have reviewed the patients History and Physical, chart, labs and discussed the procedure including the risks, benefits and alternatives for the proposed anesthesia with the patient or authorized representative who has indicated his/her understanding and acceptance.     Dental advisory given  Plan Discussed with: CRNA and Surgeon  Anesthesia Plan Comments: (Discussed risks of anesthesia with patient, including possibility of difficulty with spontaneous ventilation under anesthesia necessitating airway intervention, PONV, and rare risks such as cardiac or respiratory or neurological events, and allergic reactions. Discussed the role of CRNA in patient's perioperative care. Patient understands. Patient counseled on benefits of smoking cessation, and  increased perioperative risks associated with continued smoking. )       Anesthesia Quick Evaluation

## 2024-05-05 NOTE — Progress Notes (Signed)
 The patient came with a lower GI bleed.  The patient had a colonoscopy today that showed diverticulosis throughout the entire colon.  There is no sign of any active bleeding or any sign of old blood.  There is no masses or other lesions seen except for some small to moderate-sized hemorrhoids.  If bleeding should recur the patient should go through a bleeding scan with possible embolization if the bleeding source is found.  Nothing further to do from a GI point of view.  I will sign off.  Please call if any further GI concerns or questions.  We would like to thank you for the opportunity to participate in the care of Brian [name].

## 2024-05-05 NOTE — Anesthesia Postprocedure Evaluation (Signed)
 Anesthesia Post Note  Patient: Brian [name]  Procedure(s) Performed: COLONOSCOPY  Patient location during evaluation: Endoscopy Anesthesia Type: General Level of consciousness: awake and alert Pain management: pain level controlled Vital Signs Assessment: post-procedure vital signs reviewed and stable Respiratory status: spontaneous breathing, nonlabored ventilation, respiratory function stable and patient connected to nasal cannula oxygen Cardiovascular status: blood pressure returned to baseline and stable Postop Assessment: no apparent nausea or vomiting Anesthetic complications: no   There were no known notable events for this encounter.   Last Vitals:  Vitals:   05/05/24 1032 05/05/24 1140  BP: (!) 145/97 96/64  Pulse: 71 85  Resp: 18 (!) 24  Temp: 36.4 C   SpO2: 97% 100%    Last Pain:  Vitals:   05/05/24 1140  TempSrc: Temporal  PainSc: 0-No pain                 Rome Ade

## 2024-05-05 NOTE — Transfer of Care (Signed)
 Immediate Anesthesia Transfer of Care Note  Patient: Brian [name]  Procedure(s) Performed: COLONOSCOPY  Patient Location: PACU  Anesthesia Type:General  Level of Consciousness: awake  Airway & Oxygen Therapy: Patient Spontanous Breathing  Post-op Assessment: Report given to RN and Post -op Vital signs reviewed and stable  Post vital signs: Reviewed and stable  Last Vitals:  Vitals Value Taken Time  BP 114/64 05/05/24 11:48  Temp    Pulse 80 05/05/24 11:51  Resp 8 05/05/24 11:51  SpO2 99 % 05/05/24 11:51  Vitals shown include unfiled device data.  Last Pain:  Vitals:   05/05/24 1140  TempSrc: Temporal  PainSc: 0-No pain         Complications: There were no known notable events for this encounter.

## 2024-05-05 NOTE — Discharge Summary (Signed)
 [facility] Discharge Summary   Patient: Brian [name] FMW:[id number]  PCP: Vicci Duwaine SQUIBB, DO  Date of admission: 05/01/2024   Date of discharge:  05/05/2024     Discharge Diagnoses:  Principal Problem:   Lower GI bleed Active Problems:   CAD (coronary artery disease)   Anxiety and depression   AAA (abdominal aortic aneurysm) without rupture (HCC)   Mixed hyperlipidemia   Hypertension with heart disease   Acquired hypothyroidism   ABLA (acute blood loss anemia)   Lower GI bleeding   Admitted From: [facility] Disposition:  Home   Recommendations for Outpatient Follow-up:  PCP: Follow-up with PCP in 1 week,  Follow up LABS/TEST:  repeat CBC after 1 week.    Follow-up Information     Vicci Duwaine P, DO Follow up in 1 week(s).   Specialty: Family Medicine Contact information: [address] [place] [address] [phone]                Diet recommendation: Cardiac diet  Activity: The patient is advised to gradually reintroduce usual activities, as tolerated  Discharge Condition: stable  Code Status: Full code   History of present illness: As per the H and P dictated on admission. Hospital Course:  erry Brian [name] is a 89 y.o. male with medical history significant of AAA, CAD, ETOH use d/o, GI bleeding, HTN, and HLD who presented on 6/19 with rectal bleeding.  He reports that he had a BM and there was blood in it about 0900.  He has up to 6 episodes since.  He still works [employer] and was at work.  He is a little light-headed when he is out of bed.  No SOB.  Last C-scope was 9 years ago and had a post-polypectomy bleed requiring transfusion.   ER Course:  Rectal bleeding on Plavix .  Started today at work.  Hgb stable.  CTA A/P: negative for any acute bleeding.  Infrarenal AAA, Bilobed, fusiform aneurysm of the infrarenal aorta. The superior aneurysm is the largest component, measuring 4.4 x 4.4 x 4.5 cm. No findings to suggest rupture. Diffusely  ectatic common iliac arteries bilaterally measuring up to 2.1 cm on the left. Total colonic diverticulosis. No changes of acute diverticulitis. Nonobstructive bilateral nephrolithiasis.  No hydronephrosis.   Assessment and Plan:  Lower GI Bleeding, Resolved itself  Most likely etiology is diverticular bleeding Patient had prior history of lower GI bleeding secondary to polyps and second episode happened after polypectomy due to slippage of clip and after that no GI bleeding until this time. He is afebrile at this time without tachycardia or leukocytosis; will not give antibiotics at this time.  H&H remained stable Patient is on Plavix , last dose was on Wednesday, 04/30/2024 6/20 GI consulted, recommended colonoscopy after 3 to 5 days of holding Plavix .  So colonoscopy was scheduled to be done on 6/23 S/p Clear liquid diet, advanced to soft diet, no active bleeding.  6/22 no active bleeding, Hb 9.4 slightly low. 6/23 s/p colonoscopy, no active bleeding, internal hemorrhoids.  Patient was cleared by GI to discharge and follow-up as an outpatient.  Hb 9.6, stable, patient denied any active bleeding, and would like to go home.  Patient is medically stable to discharge home.  Follow with PCP and repeat CBC after 1 week.  # ABLA (acute blood loss anemia) Patient's lightheadedness is most likely caused by anemia secondary to lower GI bleeding.  Bleeding resolved itself, H&H remained stable. Iron and folate level  within normal range Vitamin B12 level 225, goal >400, started vitamin B12 1000 mcg IM injection followed by oral supplement.   # CAD/AAA: No current CP.  Held aspirin  and Plavix  during hospital stay.  H&H stable, resumed DAPT.  Patient was advised to repeat CBC after 1 week and follow with PCP.   # HTN: BP remained soft, antihypertensive medications were held due to risk of hypotension due to ongoing bleeding. BP stable now, bleeding resolved.  Resumed Toprol -XL and losartan  home dose.   Patient was advised to monitor BP and follow-up with PCP to titrate medication accordingly.  # HLD: Continue atorvastatin  # Anxiety: Continue Valium  # Hypothyroidism: Continue Synthroid  # Vitamin D  level 33, at lower end, started vitamin D  supplement to prevent deficiency    Body mass index is 18.24 kg/m.  Nutrition Interventions:  - Patient was instructed, not to drive, operate heavy machinery, perform activities at heights, swimming or participation in water activities or provide baby sitting services while on Pain, Sleep and Anxiety Medications; until his outpatient Physician has advised to do so again.  - Also recommended to not to take more than prescribed Pain, Sleep and Anxiety Medications.  Patient was ambulatory without any assistance. On the day of the discharge the patient's vitals were stable, and no other acute medical condition were reported by patient. the patient was felt safe to be discharge at Home.  Consultants: GI Procedures: s/p colonoscopy, negative  Discharge Exam: General: Appear in no distress, no Rash; Oral Mucosa Clear, moist. Cardiovascular: S1 and S2 Present, no Murmur, Respiratory: normal respiratory effort, Bilateral Air entry present and no Crackles, no wheezes Abdomen: Bowel Sound present, Soft and no tenderness, no hernia Extremities: no Pedal edema, no calf tenderness Neurology: alert and oriented to time, place, and person affect appropriate.  Filed Weights   05/01/24 0915 05/05/24 1032  Weight: 54.4 kg 54.4 kg   Vitals:   05/05/24 1200 05/05/24 1319  BP: 133/75 (!) 157/85  Pulse: 78 64  Resp: 14 18  Temp:  97.6 F (36.4 C)  SpO2: 98% 92%    DISCHARGE MEDICATION: Allergies as of 05/05/2024       Reactions   Zoloft  [sertraline  Hcl] Nausea Only   Patient states that he was not himself when he took this drug        Medication List     TAKE these medications    aspirin  EC 81 MG tablet Take 1 tablet (81 mg total) by mouth  daily. Swallow whole.   atorvastatin  80 MG tablet Commonly known as: LIPITOR  TAKE 1 TABLET BY MOUTH ONCE DAILY AT  6PM   clopidogrel  75 MG tablet Commonly known as: PLAVIX  Take 75 mg by mouth daily.   cyanocobalamin  1000 MCG tablet Take 1 tablet (1,000 mcg total) by mouth daily. Start taking on: May 09, 2024   diazepam  5 MG tablet Commonly known as: VALIUM  Take 5 mg by mouth every 8 (eight) hours as needed (for anxiety).   levothyroxine  25 MCG tablet Commonly known as: SYNTHROID  Take 1 tablet (25 mcg total) by mouth daily.   losartan  25 MG tablet Commonly known as: COZAAR  Take 0.5 tablets (12.5 mg total) by mouth daily.   metoprolol  succinate 25 MG 24 hr tablet Commonly known as: TOPROL -XL Take 1 tablet (25 mg total) by mouth daily.   mupirocin  ointment 2 % Commonly known as: BACTROBAN  Apply 1 Application topically daily.   nitroGLYCERIN  0.4 MG SL tablet Commonly known as: NITROSTAT  PLACE ONE TABLET UNDER THE  TONGUE EVERY 5 MINUTES AS NEEDED FOR CHEST PAIN   traMADol  50 MG tablet Commonly known as: ULTRAM  Take 1 tablet (50 mg total) by mouth every 4 (four) hours as needed.   Vitamin D  (Ergocalciferol ) 1.25 MG (50000 UNIT) Caps capsule Commonly known as: DRISDOL  Take 1 capsule (50,000 Units total) by mouth every 7 (seven) days. Start taking on: May 09, 2024       Allergies  Allergen Reactions   Zoloft  [Sertraline  Hcl] Nausea Only    Patient states that he was not himself when he took this drug   Discharge Instructions     Call MD for:   Complete by: As directed    Recurrent GI bleeding   Call MD for:  difficulty breathing, headache or visual disturbances   Complete by: As directed    Call MD for:  extreme fatigue   Complete by: As directed    Call MD for:  persistant dizziness or light-headedness   Complete by: As directed    Call MD for:  persistant nausea and vomiting   Complete by: As directed    Call MD for:  severe uncontrolled pain   Complete  by: As directed    Diet - low sodium heart healthy   Complete by: As directed    Discharge instructions   Complete by: As directed    Follow-up with PCP in 1 week, repeat CBC after 1 week.   Increase activity slowly   Complete by: As directed        The results of significant diagnostics from this hospitalization (including imaging, microbiology, ancillary and laboratory) are listed below for reference.    Significant Diagnostic Studies: CT Angio Abd/Pel W and/or Wo Contrast Result Date: 05/01/2024 CLINICAL DATA:  Lower GI bleed EXAM: CTA ABDOMEN AND PELVIS WITHOUT AND WITH CONTRAST TECHNIQUE: Multidetector CT imaging of the abdomen and pelvis was performed using the standard protocol during bolus administration of intravenous contrast. Multiplanar reconstructed images and MIPs were obtained and reviewed to evaluate the vascular anatomy. RADIATION DOSE REDUCTION: This exam was performed according to the departmental dose-optimization program which includes automated exposure control, adjustment of the mA and/or kV according to patient size and/or use of iterative reconstruction technique. CONTRAST:  OMNIPAQUE  IOHEXOL  350 MG/ML SOLN COMPARISON:  December 13, 2011 FINDINGS: VASCULAR Aorta: Bilobed dilation of the infrarenal aorta. The more superior fusiform aneurysm measures 4.4 x 4.4 x 4.5 cm. The distal component measures 3 x 3.2 x 2.5 cm. No periaortic fluid collection or inflammation. In the superior component, there is a large amount of mural thrombus throughout the aneurysm sac. A 3.1 cm channel remains patent in the aneurysm sac. No hemodynamically significant stenosis. Celiac: Patent with a high takeoff at the diaphragmatic hiatus. 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 No acute thrombus or dissection.No hemodynamically significant stenosis. SMA: Patent without acute thrombus, aneurysm, or dissection.No hemodynamically  significant stenosis. Renals: Single right renal artery with 2 left renal arteries. Patent without acute thrombus, aneurysm, or dissection.No hemodynamically significant stenosis. IMA: Patent without acute thrombus, aneurysm, or dissection.Moderate narrowing of the IMA ostium from calcified plaque. Inflow: Both common iliac arteries are diffusely ectatic. For example, the common iliac arteries measure up to 1.7 cm on the right and 2.1 cm on the left.No hemodynamically significant stenosis. Proximal Outflow: The bilateral common femoral and visualized portions of the superficial and profunda femoral arteries are patent without acute thrombus, aneurysm, or dissection.No hemodynamically significant  stenosis. Veins: No obvious venous abnormality within the limitations of this arterial phase study. Review of the MIP images confirms the above findings. NON-VASCULAR Lower chest: No focal airspace consolidation or pleural effusion.Clustered tree-in-bud nodularity in the lateral right lower lobe. Mild centrilobular emphysema. Hepatobiliary: No mass.No radiopaque stones or wall thickening of the gallbladder.No intrahepatic or extrahepatic biliary ductal dilation. Pancreas: No mass or main ductal dilation.No peripancreatic inflammation or fluid collection. Spleen: Normal size. No mass. Adrenals/Urinary Tract: No adrenal masses. Smaller right interpolar region cyst, measuring 1.8 cm. Multiple additional small cysts are noted in both kidneys. Nonobstructive calculi in both kidneys. No hydronephrosis. The urinary bladder is distended without focal abnormality. Stomach/Bowel: The stomach is decompressed without focal abnormality. No small bowel wall thickening or inflammation. No small bowel obstruction.Normal appendix. Total colonic diverticulosis. No changes of acute diverticulitis. GI Bleed: No extravasation of contrast to suggest active GI bleeding. Lymphatic: No intraabdominal or pelvic lymphadenopathy. Reproductive: No  prostatomegaly.No free pelvic fluid. Other: No pneumoperitoneum, ascites, or mesenteric inflammation. Musculoskeletal: No acute fracture or destructive lesion.Diffuse osteopenia. Multilevel degenerative disc disease of the spine. Mild-to-moderate bilateral hip osteoarthritis. IMPRESSION: VASCULAR 1. No extravasation of contrast to suggest active GI bleeding. 2. Bilobed, fusiform aneurysm of the infrarenal aorta. The superior aneurysm is the largest component, measuring 4.4 x 4.4 x 4.5 cm. No findings to suggest rupture. 3. Diffusely ectatic common iliac arteries bilaterally measuring up to 2.1 cm on the left. NON-VASCULAR 1. Total colonic diverticulosis. No changes of acute diverticulitis. 2. Nonobstructive bilateral nephrolithiasis.  No hydronephrosis. Aortic Atherosclerosis (ICD10-I70.0). Aortic aneurysm NOS (ICD10-I71.9). Electronically Signed   By: Rogelia Myers M.D.   On: 05/01/2024 12:49    Microbiology: No results found for this or any previous visit (from the past 240 hours).   Labs: CBC: Recent Labs  Lab 05/02/24 0912 05/02/24 1515 05/03/24 0411 05/04/24 0537 05/05/24 0355  WBC 6.2 6.2 6.4 6.5 5.5  HGB 11.4* 10.9* 10.1* 9.4* 9.6*  HCT 32.9* 32.2* 30.0* 29.2* 28.0*  MCV 95.6 95.5 96.8 98.3 95.9  PLT 127* 121* 113* 108* 115*   Basic Metabolic Panel: Recent Labs  Lab 05/01/24 0918 05/02/24 0256 05/03/24 0411 05/04/24 0537 05/05/24 0355  NA 138 140 140 138 139  K 3.6 3.5 3.6 3.3* 3.6  CL 104 108 110 108 110  CO2 25 27 22 26 23   GLUCOSE 130* 107* 107* 100* 81  BUN 11 9 9 9 9   CREATININE 0.70 0.59* 0.63 0.71 0.61  CALCIUM  9.1 8.3* 8.3* 8.2* 8.4*  MG  --  1.9 1.8 1.7 1.7  PHOS  --  2.8 2.7 3.1 3.1   Liver Function Tests: Recent Labs  Lab 05/01/24 0918  AST 23  ALT 19  ALKPHOS 61  BILITOT 0.6  PROT 6.9  ALBUMIN 4.1   No results for input(s): LIPASE, AMYLASE in the last 168 hours. No results for input(s): AMMONIA in the last 168 hours. Cardiac Enzymes: No  results for input(s): CKTOTAL, CKMB, CKMBINDEX, TROPONINI in the last 168 hours. BNP (last 3 results) No results for input(s): BNP in the last 8760 hours. CBG: No results for input(s): GLUCAP in the last 168 hours.  Time spent: 35 minutes  Signed:  Elvan Sor  [facility] 05/05/2024 2:52 PM

## 2024-05-05 NOTE — Op Note (Signed)
 [facility] Gastroenterology Patient Name: Brian [name] Procedure Date: 05/05/2024 11:06 AM MRN: [id number] Account #: [id number] Date of Birth: Dec 09, 1932 Admit Type: Inpatient Age: 89 Room: [facility] ENDO ROOM 4 Gender: Male Note Status: Finalized Instrument Name: [id number] Procedure:             Colonoscopy Indications:           Hematochezia Providers:             Rogelia Copping MD, MD Referring MD:          Brian [name] (Referring MD) Medicines:             Propofol  per Anesthesia Complications:         No immediate complications. Procedure:             Pre-Anesthesia Assessment:                        - Prior to the procedure, a History and Physical was                         performed, and patient medications and allergies were                         reviewed. The patient's tolerance of previous                         anesthesia was also reviewed. The risks and benefits                         of the procedure and the sedation options and risks                         were discussed with the patient. All questions were                         answered, and informed consent was obtained. Prior                         Anticoagulants: The patient has taken no anticoagulant                         or antiplatelet agents. ASA Grade Assessment: II - A                         patient with mild systemic disease. After reviewing                         the risks and benefits, the patient was deemed in                         satisfactory condition to undergo the procedure.                        After obtaining informed consent, the colonoscope was                         passed under direct vision. Throughout the procedure,  the patient's blood pressure, pulse, and oxygen                         saturations were monitored continuously. The                         Colonoscope was introduced through the anus and                          advanced to the the cecum, identified by appendiceal                         orifice and ileocecal valve. The colonoscopy was                         performed without difficulty. The patient tolerated                         the procedure well. The quality of the bowel                         preparation was good. Findings:      The perianal and digital rectal examinations were normal.      Multiple small-mouthed diverticula were found in the entire colon.      Non-bleeding internal hemorrhoids were found during retroflexion. The       hemorrhoids were Grade II (internal hemorrhoids that prolapse but reduce       spontaneously). Impression:            - Diverticulosis in the entire examined colon.                        - Non-bleeding internal hemorrhoids.                        - No specimens collected. Recommendation:        - Discharge patient to home.                        - Resume previous diet.                        - Continue present medications.                        - Repeat colonoscopy is not recommended for                         surveillance. Procedure Code(s):     --- Professional ---                        [phone], Colonoscopy, flexible; diagnostic, including                         collection of specimen(s) by brushing or washing, when                         performed (separate procedure) Diagnosis Code(s):     --- Professional ---  K92.1, Melena (includes Hematochezia) CPT copyright 2022 American [employer]. All rights reserved. The codes documented in this report are preliminary and upon coder review may  be revised to meet current compliance requirements. Rogelia Copping MD, MD 05/05/2024 11:37:40 AM This report has been signed electronically. Number of Addenda: 0 Note Initiated On: 05/05/2024 11:06 AM Scope Withdrawal Time: 0 hours 7 minutes 44 seconds  Total Procedure Duration: 0 hours 12 minutes 35 seconds  Estimated Blood Loss:   Estimated blood loss: none.      [facility]

## 2024-05-05 NOTE — Plan of Care (Signed)

## 2024-05-05 NOTE — TOC Initial Note (Signed)
 Transition of Care [facility]) - Initial/Assessment Note    Patient Details  Name: Brian [name] MRN: [id number] Date of Birth: 03-Jan-1933  Transition of Care [facility]) CM/SW Contact:    Cortnee Steinmiller C Adamarie Izzo, RN Phone Number: 05/05/2024, 12:46 PM  Clinical Narrative:                 Transition of Care Department [facility]) has reviewed patient. We will continue to monitor patient advancement. If new patient transition needs arise, please place a TOC consult.        Patient Goals and CMS Choice            Expected Discharge Plan and Services                                              Prior Living Arrangements/Services                       Activities of Daily Living   ADL Screening (condition at time of admission) Independently performs ADLs?: Yes (appropriate for developmental age) Is the patient deaf or have difficulty hearing?: Yes Does the patient have difficulty seeing, even when wearing glasses/contacts?: No Does the patient have difficulty concentrating, remembering, or making decisions?: No  Permission Sought/Granted                  Emotional Assessment              Admission diagnosis:  Acute lower GI bleeding [K92.2] Lower GI bleed [K92.2] Lower GI bleeding [K92.2] Patient Active Problem List   Diagnosis Date Noted   Lower GI bleeding 05/02/2024   Lower GI bleed 05/01/2024   ABLA (acute blood loss anemia) 05/01/2024   Acquired hypothyroidism 04/11/2024   Melanoma in situ of face excluding eyelid, nose, lip, and ear (HCC) 06/20/2023   Hypertension with heart disease 08/22/2021   Cigarette nicotine dependence without complication 08/22/2021   Aortic atherosclerosis (HCC) 02/01/2021   Non-recurrent unilateral inguinal hernia without obstruction or gangrene 12/15/2020   Mild aortic regurgitation 08/26/2020   Mixed hyperlipidemia 10/05/2019   Tobacco abuse 12/25/2017   Hematochezia 10/28/2016   CAD (coronary artery disease) 10/28/2016    HFrEF (heart failure with reduced ejection fraction) (HCC) 10/28/2016   Arthritis 01/14/2016   AAA (abdominal aortic aneurysm) without rupture (HCC) 01/14/2016   Malnutrition of moderate degree (HCC) 06/07/2015   History of colonic polyps 05/14/2015   Encounter for long-term (current) use of antiplatelets/antithrombotics 05/14/2015   History of GI diverticular bleed 05/04/2015   GERD (gastroesophageal reflux disease) 02/21/2012   Hx of adenomatous colonic polyps 02/21/2012   Anxiety and depression 02/21/2012   Benign hypertensive renal disease 02/21/2012   Renal stones 02/21/2012   PCP:  Vicci Duwaine SQUIBB, DO Pharmacy:   [facility] [address] [place] - [address] [place] [address] Phone: [phone] Fax: [phone]     Social Drivers of Health (SDOH) Social History: SDOH Screenings   Food Insecurity: No Food Insecurity (05/01/2024)  Housing: Low Risk  (05/01/2024)  Transportation Needs: No Transportation Needs (05/01/2024)  Utilities: Not At Risk (05/01/2024)  Alcohol Screen: Low Risk  (07/03/2023)  Depression (PHQ2-9): Low Risk  (04/11/2024)  Financial Resource Strain: Low Risk  (08/06/2023)  Physical Activity: Insufficiently Active (08/06/2023)  Social Connections: Unknown (05/01/2024)  Stress: No Stress Concern Present (08/06/2023)  Tobacco Use: High Risk (05/05/2024)  Health Literacy: Adequate Health Literacy (07/03/2023)   SDOH Interventions:     Readmission Risk Interventions     No data to display

## 2024-05-15 ENCOUNTER — Encounter: Payer: Self-pay | Admitting: Nurse Practitioner

## 2024-05-15 ENCOUNTER — Ambulatory Visit (INDEPENDENT_AMBULATORY_CARE_PROVIDER_SITE_OTHER): Admitting: Nurse Practitioner

## 2024-05-15 VITALS — BP 134/84 | HR 60 | Ht 68.0 in | Wt 119.0 lb

## 2024-05-15 DIAGNOSIS — K922 Gastrointestinal hemorrhage, unspecified: Secondary | ICD-10-CM | POA: Diagnosis not present

## 2024-05-15 DIAGNOSIS — Z09 Encounter for follow-up examination after completed treatment for conditions other than malignant neoplasm: Secondary | ICD-10-CM | POA: Diagnosis not present

## 2024-05-15 NOTE — Assessment & Plan Note (Signed)
 Recovered from GI Bleed. Will recheck CBC at visit today.  Denies referral to GI at this time.

## 2024-05-15 NOTE — Progress Notes (Signed)
 BP 134/84   Pulse 60   Ht 5' 8 (1.727 m)   Wt 119 lb (54 kg)   SpO2 95%   BMI 18.09 kg/m    Subjective:    Patient ID: Brian [name], male    DOB: 10-05-1933, 89 y.o.   MRN: [id number]  HPI: Brian [name] is a 89 y.o. male  Chief Complaint  Patient presents with   hospital f/u    Pt stated--feeling ok, painful lower abdominal.   Transition of Care Hospital Follow up.   Hospital/Facility: [facility] D/C Physician: Dr. Von D/C Date: 05/05/24  Records Requested: NA Records Received: NA Records Reviewed: Yes  Diagnoses on Discharge:   Patient states he has bowel movements at least every other day which is normal for him.  He is not having any pain other than his typical hernia pain. Denies any blood in his stool, fatigue or abdominal pain.   Lower GI Bleeding, Resolved itself  Most likely etiology is diverticular bleeding Patient had prior history of lower GI bleeding secondary to polyps and second episode happened after polypectomy due to slippage of clip and after that no GI bleeding until this time. He is afebrile at this time without tachycardia or leukocytosis; will not give antibiotics at this time.  H&H remained stable Patient is on Plavix , last dose was on Wednesday, 04/30/2024 6/20 GI consulted, recommended colonoscopy after 3 to 5 days of holding Plavix .  So colonoscopy was scheduled to be done on 6/23 S/p Clear liquid diet, advanced to soft diet, no active bleeding.  6/22 no active bleeding, Hb 9.4 slightly low. 6/23 s/p colonoscopy, no active bleeding, internal hemorrhoids.  Patient was cleared by GI to discharge and follow-up as an outpatient.  Hb 9.6, stable, patient denied any active bleeding, and would like to go home.  Patient is medically stable to discharge home.  Follow with PCP and repeat CBC after 1 week.   # ABLA (acute blood loss anemia) Patient's lightheadedness is most likely caused by anemia secondary to lower GI bleeding.  Bleeding resolved itself,  H&H remained stable. Iron and folate level within normal range Vitamin B12 level 225, goal >400, started vitamin B12 1000 mcg IM injection followed by oral supplement.   # CAD/AAA: No current CP.  Held aspirin  and Plavix  during hospital stay.  H&H stable, resumed DAPT.  Patient was advised to repeat CBC after 1 week and follow with PCP.   # HTN: BP remained soft, antihypertensive medications were held due to risk of hypotension due to ongoing bleeding. BP stable now, bleeding resolved.  Resumed Toprol -XL and losartan  home dose.  Patient was advised to monitor BP and follow-up with PCP to titrate medication accordingly.   # HLD: Continue atorvastatin  # Anxiety: Continue Valium  # Hypothyroidism: Continue Synthroid  # Vitamin D  level 33, at lower end, started vitamin D  supplement to prevent deficiency  Date of interactive Contact within 48 hours of discharge:  Contact was through: no contact was made  Date of 7 day or 14 day face-to-face visit:    within 14 days  Outpatient Encounter Medications as of 05/15/2024  Medication Sig Note   aspirin  EC 81 MG tablet Take 1 tablet (81 mg total) by mouth daily. Swallow whole.    atorvastatin  (LIPITOR ) 80 MG tablet TAKE 1 TABLET BY MOUTH ONCE DAILY AT  6PM    clopidogrel  (PLAVIX ) 75 MG tablet Take 75 mg by mouth daily.    cyanocobalamin  1000 MCG tablet Take 1 tablet (1,000  mcg total) by mouth daily.    diazepam  (VALIUM ) 5 MG tablet Take 5 mg by mouth every 8 (eight) hours as needed (for anxiety). 05/01/2024: PRN   levothyroxine  (SYNTHROID ) 25 MCG tablet Take 1 tablet (25 mcg total) by mouth daily.    losartan  (COZAAR ) 25 MG tablet Take 0.5 tablets (12.5 mg total) by mouth daily.    metoprolol  succinate (TOPROL -XL) 25 MG 24 hr tablet Take 1 tablet (25 mg total) by mouth daily.    mupirocin  ointment (BACTROBAN ) 2 % Apply 1 Application topically daily. 07/03/2023: prn   nitroGLYCERIN  (NITROSTAT ) 0.4 MG SL tablet PLACE ONE TABLET UNDER THE TONGUE EVERY 5  MINUTES AS NEEDED FOR CHEST PAIN 05/01/2024: PRN   traMADol  (ULTRAM ) 50 MG tablet Take 1 tablet (50 mg total) by mouth every 4 (four) hours as needed. 05/01/2024: PRN   Vitamin D , Ergocalciferol , (DRISDOL ) 1.25 MG (50000 UNIT) CAPS capsule Take 1 capsule (50,000 Units total) by mouth every 7 (seven) days.    No facility-administered encounter medications on file as of 05/15/2024.    Diagnostic Tests Reviewed/Disposition:  Reviewed  Consults: GI  Discharge Instructions: Reviewed with patient  Disease/illness Education: Discussed with patient during visit  Home Health/Community Services Discussions/Referrals: Declined referral to GI  Establishment or re-establishment of referral orders for community resources: NA  Discussion with other health care providers: NA  Assessment and Support of treatment regimen adherence: Discussed during visit  Appointments Coordinated with: Patient and his daughter  Education for self-management, independent living, and ADLs: Reviewed with patient and daughter  Relevant past medical, surgical, family and social history reviewed and updated as indicated. Interim medical history since our last visit reviewed. Allergies and medications reviewed and updated.  Review of Systems  Constitutional:  Negative for fatigue.  Gastrointestinal:  Negative for abdominal pain and blood in stool.    Per HPI unless specifically indicated above     Objective:    BP 134/84   Pulse 60   Ht 5' 8 (1.727 m)   Wt 119 lb (54 kg)   SpO2 95%   BMI 18.09 kg/m   Wt Readings from Last 3 Encounters:  05/15/24 119 lb (54 kg)  05/05/24 119 lb 14.9 oz (54.4 kg)  01/31/24 122 lb 11.2 oz (55.7 kg)    Physical Exam Vitals and nursing note reviewed.  Constitutional:      General: He is not in acute distress.    Appearance: Normal appearance. He is not ill-appearing, toxic-appearing or diaphoretic.  HENT:     Head: Normocephalic.     Right Ear: External ear normal.      Left Ear: External ear normal.     Nose: Nose normal. No congestion or rhinorrhea.     Mouth/Throat:     Mouth: Mucous membranes are moist.  Eyes:     General:        Right eye: No discharge.        Left eye: No discharge.     Extraocular Movements: Extraocular movements intact.     Conjunctiva/sclera: Conjunctivae normal.     Pupils: Pupils are equal, round, and reactive to light.  Cardiovascular:     Rate and Rhythm: Normal rate and regular rhythm.     Heart sounds: No murmur heard. Pulmonary:     Effort: Pulmonary effort is normal. No respiratory distress.     Breath sounds: Normal breath sounds. No wheezing, rhonchi or rales.  Abdominal:     General: Abdomen is flat. Bowel sounds are  normal. There is no distension.     Palpations: Abdomen is soft. There is no mass.     Tenderness: There is no abdominal tenderness. There is no right CVA tenderness, left CVA tenderness, guarding or rebound.     Hernia: No hernia is present.  Musculoskeletal:     Cervical back: Normal range of motion and neck supple.  Skin:    General: Skin is warm and dry.     Capillary Refill: Capillary refill takes less than 2 seconds.  Neurological:     General: No focal deficit present.     Mental Status: He is alert and oriented to person, place, and time.  Psychiatric:        Mood and Affect: Mood normal.        Behavior: Behavior normal.        Thought Content: Thought content normal.        Judgment: Judgment normal.     Results for orders placed or performed during the hospital encounter of 05/01/24  Comprehensive metabolic panel   Collection Time: 05/01/24  9:18 AM  Result Value Ref Range   Sodium 138 135 - 145 mmol/L   Potassium 3.6 3.5 - 5.1 mmol/L   Chloride 104 98 - 111 mmol/L   CO2 25 22 - 32 mmol/L   Glucose, Bld 130 (H) 70 - 99 mg/dL   BUN 11 8 - 23 mg/dL   Creatinine, Ser 9.29 0.61 - 1.24 mg/dL   Calcium  9.1 8.9 - 10.3 mg/dL   Total Protein 6.9 6.5 - 8.1 g/dL   Albumin 4.1 3.5 -  5.0 g/dL   AST 23 15 - 41 U/L   ALT 19 0 - 44 U/L   Alkaline Phosphatase 61 38 - 126 U/L   Total Bilirubin 0.6 0.0 - 1.2 mg/dL   GFR, Estimated >39 >39 mL/min   Anion gap 9 5 - 15  CBC   Collection Time: 05/01/24  9:18 AM  Result Value Ref Range   WBC 7.3 4.0 - 10.5 K/uL   RBC 4.43 4.22 - 5.81 MIL/uL   Hemoglobin 14.3 13.0 - 17.0 g/dL   HCT 57.3 60.9 - 47.9 %   MCV 96.2 80.0 - 100.0 fL   MCH 32.3 26.0 - 34.0 pg   MCHC 33.6 30.0 - 36.0 g/dL   RDW 87.0 88.4 - 84.4 %   Platelets 147 (L) 150 - 400 K/uL   nRBC 0.0 0.0 - 0.2 %  Type and screen [facility]   Collection Time: 05/01/24  9:18 AM  Result Value Ref Range   ABO/RH(D) A POS    Antibody Screen NEG    Sample Expiration      05/04/2024,2359 Performed at [facility], [address] [place], [place] [address]   CBC   Collection Time: 05/01/24  3:18 PM  Result Value Ref Range   WBC 7.5 4.0 - 10.5 K/uL   RBC 4.15 (L) 4.22 - 5.81 MIL/uL   Hemoglobin 13.8 13.0 - 17.0 g/dL   HCT 59.8 60.9 - 47.9 %   MCV 96.6 80.0 - 100.0 fL   MCH 33.3 26.0 - 34.0 pg   MCHC 34.4 30.0 - 36.0 g/dL   RDW 87.1 88.4 - 84.4 %   Platelets 141 (L) 150 - 400 K/uL   nRBC 0.0 0.0 - 0.2 %  CBC   Collection Time: 05/01/24  9:21 PM  Result Value Ref Range   WBC 8.9 4.0 - 10.5 K/uL  RBC 4.03 (L) 4.22 - 5.81 MIL/uL   Hemoglobin 13.1 13.0 - 17.0 g/dL   HCT 61.6 (L) 60.9 - 47.9 %   MCV 95.0 80.0 - 100.0 fL   MCH 32.5 26.0 - 34.0 pg   MCHC 34.2 30.0 - 36.0 g/dL   RDW 87.0 88.4 - 84.4 %   Platelets 153 150 - 400 K/uL   nRBC 0.0 0.0 - 0.2 %  CBC   Collection Time: 05/02/24  2:56 AM  Result Value Ref Range   WBC 6.2 4.0 - 10.5 K/uL   RBC 3.32 (L) 4.22 - 5.81 MIL/uL   Hemoglobin 11.1 (L) 13.0 - 17.0 g/dL   HCT 68.2 (L) 60.9 - 47.9 %   MCV 95.5 80.0 - 100.0 fL   MCH 33.4 26.0 - 34.0 pg   MCHC 35.0 30.0 - 36.0 g/dL   RDW 87.0 88.4 - 84.4 %   Platelets 125 (L) 150 - 400 K/uL   nRBC 0.0 0.0 - 0.2 %  Basic metabolic  panel   Collection Time: 05/02/24  2:56 AM  Result Value Ref Range   Sodium 140 135 - 145 mmol/L   Potassium 3.5 3.5 - 5.1 mmol/L   Chloride 108 98 - 111 mmol/L   CO2 27 22 - 32 mmol/L   Glucose, Bld 107 (H) 70 - 99 mg/dL   BUN 9 8 - 23 mg/dL   Creatinine, Ser 9.40 (L) 0.61 - 1.24 mg/dL   Calcium  8.3 (L) 8.9 - 10.3 mg/dL   GFR, Estimated >39 >39 mL/min   Anion gap 5 5 - 15  Iron and TIBC   Collection Time: 05/02/24  2:56 AM  Result Value Ref Range   Iron 142 45 - 182 ug/dL   TIBC 734 749 - 549 ug/dL   Saturation Ratios 54 (H) 17.9 - 39.5 %   UIBC 123 ug/dL  Folate   Collection Time: 05/02/24  2:56 AM  Result Value Ref Range   Folate 9.4 >5.9 ng/mL  Magnesium    Collection Time: 05/02/24  2:56 AM  Result Value Ref Range   Magnesium  1.9 1.7 - 2.4 mg/dL  Phosphorus   Collection Time: 05/02/24  2:56 AM  Result Value Ref Range   Phosphorus 2.8 2.5 - 4.6 mg/dL  CBC   Collection Time: 05/02/24  9:12 AM  Result Value Ref Range   WBC 6.2 4.0 - 10.5 K/uL   RBC 3.44 (L) 4.22 - 5.81 MIL/uL   Hemoglobin 11.4 (L) 13.0 - 17.0 g/dL   HCT 67.0 (L) 60.9 - 47.9 %   MCV 95.6 80.0 - 100.0 fL   MCH 33.1 26.0 - 34.0 pg   MCHC 34.7 30.0 - 36.0 g/dL   RDW 86.9 88.4 - 84.4 %   Platelets 127 (L) 150 - 400 K/uL   nRBC 0.0 0.0 - 0.2 %  VITAMIN D  25 Hydroxy (Vit-D Deficiency, Fractures)   Collection Time: 05/02/24  9:12 AM  Result Value Ref Range   Vit D, 25-Hydroxy 33.22 30 - 100 ng/mL  Vitamin B12   Collection Time: 05/02/24  9:12 AM  Result Value Ref Range   Vitamin B-12 225 180 - 914 pg/mL  CBC   Collection Time: 05/02/24  3:15 PM  Result Value Ref Range   WBC 6.2 4.0 - 10.5 K/uL   RBC 3.37 (L) 4.22 - 5.81 MIL/uL   Hemoglobin 10.9 (L) 13.0 - 17.0 g/dL   HCT 67.7 (L) 60.9 - 47.9 %   MCV 95.5 80.0 - 100.0  fL   MCH 32.3 26.0 - 34.0 pg   MCHC 33.9 30.0 - 36.0 g/dL   RDW 87.0 88.4 - 84.4 %   Platelets 121 (L) 150 - 400 K/uL   nRBC 0.0 0.0 - 0.2 %  Basic metabolic panel with GFR    Collection Time: 05/03/24  4:11 AM  Result Value Ref Range   Sodium 140 135 - 145 mmol/L   Potassium 3.6 3.5 - 5.1 mmol/L   Chloride 110 98 - 111 mmol/L   CO2 22 22 - 32 mmol/L   Glucose, Bld 107 (H) 70 - 99 mg/dL   BUN 9 8 - 23 mg/dL   Creatinine, Ser 9.36 0.61 - 1.24 mg/dL   Calcium  8.3 (L) 8.9 - 10.3 mg/dL   GFR, Estimated >39 >39 mL/min   Anion gap 8 5 - 15  CBC   Collection Time: 05/03/24  4:11 AM  Result Value Ref Range   WBC 6.4 4.0 - 10.5 K/uL   RBC 3.10 (L) 4.22 - 5.81 MIL/uL   Hemoglobin 10.1 (L) 13.0 - 17.0 g/dL   HCT 69.9 (L) 60.9 - 47.9 %   MCV 96.8 80.0 - 100.0 fL   MCH 32.6 26.0 - 34.0 pg   MCHC 33.7 30.0 - 36.0 g/dL   RDW 86.8 88.4 - 84.4 %   Platelets 113 (L) 150 - 400 K/uL   nRBC 0.0 0.0 - 0.2 %  Magnesium    Collection Time: 05/03/24  4:11 AM  Result Value Ref Range   Magnesium  1.8 1.7 - 2.4 mg/dL  Phosphorus   Collection Time: 05/03/24  4:11 AM  Result Value Ref Range   Phosphorus 2.7 2.5 - 4.6 mg/dL  Basic metabolic panel with GFR   Collection Time: 05/04/24  5:37 AM  Result Value Ref Range   Sodium 138 135 - 145 mmol/L   Potassium 3.3 (L) 3.5 - 5.1 mmol/L   Chloride 108 98 - 111 mmol/L   CO2 26 22 - 32 mmol/L   Glucose, Bld 100 (H) 70 - 99 mg/dL   BUN 9 8 - 23 mg/dL   Creatinine, Ser 9.28 0.61 - 1.24 mg/dL   Calcium  8.2 (L) 8.9 - 10.3 mg/dL   GFR, Estimated >39 >39 mL/min   Anion gap 4 (L) 5 - 15  CBC   Collection Time: 05/04/24  5:37 AM  Result Value Ref Range   WBC 6.5 4.0 - 10.5 K/uL   RBC 2.97 (L) 4.22 - 5.81 MIL/uL   Hemoglobin 9.4 (L) 13.0 - 17.0 g/dL   HCT 70.7 (L) 60.9 - 47.9 %   MCV 98.3 80.0 - 100.0 fL   MCH 31.6 26.0 - 34.0 pg   MCHC 32.2 30.0 - 36.0 g/dL   RDW 86.9 88.4 - 84.4 %   Platelets 108 (L) 150 - 400 K/uL   nRBC 0.0 0.0 - 0.2 %  Magnesium    Collection Time: 05/04/24  5:37 AM  Result Value Ref Range   Magnesium  1.7 1.7 - 2.4 mg/dL  Phosphorus   Collection Time: 05/04/24  5:37 AM  Result Value Ref Range    Phosphorus 3.1 2.5 - 4.6 mg/dL  Basic metabolic panel with GFR   Collection Time: 05/05/24  3:55 AM  Result Value Ref Range   Sodium 139 135 - 145 mmol/L   Potassium 3.6 3.5 - 5.1 mmol/L   Chloride 110 98 - 111 mmol/L   CO2 23 22 - 32 mmol/L   Glucose, Bld 81 70 - 99 mg/dL  BUN 9 8 - 23 mg/dL   Creatinine, Ser 9.38 0.61 - 1.24 mg/dL   Calcium  8.4 (L) 8.9 - 10.3 mg/dL   GFR, Estimated >39 >39 mL/min   Anion gap 6 5 - 15  CBC   Collection Time: 05/05/24  3:55 AM  Result Value Ref Range   WBC 5.5 4.0 - 10.5 K/uL   RBC 2.92 (L) 4.22 - 5.81 MIL/uL   Hemoglobin 9.6 (L) 13.0 - 17.0 g/dL   HCT 71.9 (L) 60.9 - 47.9 %   MCV 95.9 80.0 - 100.0 fL   MCH 32.9 26.0 - 34.0 pg   MCHC 34.3 30.0 - 36.0 g/dL   RDW 86.9 88.4 - 84.4 %   Platelets 115 (L) 150 - 400 K/uL   nRBC 0.0 0.0 - 0.2 %  Magnesium    Collection Time: 05/05/24  3:55 AM  Result Value Ref Range   Magnesium  1.7 1.7 - 2.4 mg/dL  Phosphorus   Collection Time: 05/05/24  3:55 AM  Result Value Ref Range   Phosphorus 3.1 2.5 - 4.6 mg/dL      Assessment & Plan:   Problem List Items Addressed This Visit       Digestive   Lower GI bleed   Recovered from GI Bleed. Will recheck CBC at visit today.  Denies referral to GI at this time.      Relevant Orders   Ambulatory referral to Gastroenterology   Other Visit Diagnoses       Hospital discharge follow-up    -  Primary   CMP and CBC checked at visit today.  Does not want to see GI at this time.  Feels well and denies any blood in his stool.   Relevant Orders   Comp Met (CMET)   CBC w/Diff        Follow up plan: No follow-ups on file.

## 2024-05-16 LAB — CBC WITH DIFFERENTIAL/PLATELET
Basophils Absolute: 0 x10E3/uL (ref 0.0–0.2)
Basos: 1 %
EOS (ABSOLUTE): 0.3 x10E3/uL (ref 0.0–0.4)
Eos: 4 %
Hematocrit: 38.4 % (ref 37.5–51.0)
Hemoglobin: 12.6 g/dL — ABNORMAL LOW (ref 13.0–17.7)
Immature Grans (Abs): 0 x10E3/uL (ref 0.0–0.1)
Immature Granulocytes: 0 %
Lymphocytes Absolute: 2.1 x10E3/uL (ref 0.7–3.1)
Lymphs: 32 %
MCH: 32.9 pg (ref 26.6–33.0)
MCHC: 32.8 g/dL (ref 31.5–35.7)
MCV: 100 fL — ABNORMAL HIGH (ref 79–97)
Monocytes Absolute: 0.5 x10E3/uL (ref 0.1–0.9)
Monocytes: 8 %
Neutrophils Absolute: 3.7 x10E3/uL (ref 1.4–7.0)
Neutrophils: 55 %
Platelets: 198 x10E3/uL (ref 150–450)
RBC: 3.83 x10E6/uL — ABNORMAL LOW (ref 4.14–5.80)
RDW: 12.3 % (ref 11.6–15.4)
WBC: 6.6 x10E3/uL (ref 3.4–10.8)

## 2024-05-16 LAB — COMPREHENSIVE METABOLIC PANEL WITH GFR
ALT: 12 IU/L (ref 0–44)
AST: 19 IU/L (ref 0–40)
Albumin: 4.3 g/dL (ref 3.7–4.7)
Alkaline Phosphatase: 78 IU/L (ref 44–121)
BUN/Creatinine Ratio: 12 (ref 10–24)
BUN: 10 mg/dL (ref 8–27)
Bilirubin Total: 0.6 mg/dL (ref 0.0–1.2)
CO2: 24 mmol/L (ref 20–29)
Calcium: 9.4 mg/dL (ref 8.6–10.2)
Chloride: 103 mmol/L (ref 96–106)
Creatinine, Ser: 0.85 mg/dL (ref 0.76–1.27)
Globulin, Total: 2.6 g/dL (ref 1.5–4.5)
Glucose: 98 mg/dL (ref 70–99)
Potassium: 4.3 mmol/L (ref 3.5–5.2)
Sodium: 140 mmol/L (ref 134–144)
Total Protein: 6.9 g/dL (ref 6.0–8.5)
eGFR: 84 mL/min/1.73 (ref >59)

## 2024-05-19 ENCOUNTER — Ambulatory Visit: Payer: Self-pay | Admitting: Nurse Practitioner

## 2024-06-02 DIAGNOSIS — Z79891 Long term (current) use of opiate analgesic: Secondary | ICD-10-CM | POA: Diagnosis not present

## 2024-06-02 DIAGNOSIS — M545 Low back pain, unspecified: Secondary | ICD-10-CM | POA: Diagnosis not present

## 2024-06-02 DIAGNOSIS — M25511 Pain in right shoulder: Secondary | ICD-10-CM | POA: Diagnosis not present

## 2024-06-02 DIAGNOSIS — F17210 Nicotine dependence, cigarettes, uncomplicated: Secondary | ICD-10-CM | POA: Diagnosis not present

## 2024-07-03 ENCOUNTER — Ambulatory Visit: Payer: Medicare Other | Admitting: Dermatology

## 2024-07-03 ENCOUNTER — Encounter: Payer: Self-pay | Admitting: Dermatology

## 2024-07-03 DIAGNOSIS — L578 Other skin changes due to chronic exposure to nonionizing radiation: Secondary | ICD-10-CM

## 2024-07-03 DIAGNOSIS — L814 Other melanin hyperpigmentation: Secondary | ICD-10-CM

## 2024-07-03 DIAGNOSIS — D044 Carcinoma in situ of skin of scalp and neck: Secondary | ICD-10-CM

## 2024-07-03 DIAGNOSIS — Z1283 Encounter for screening for malignant neoplasm of skin: Secondary | ICD-10-CM | POA: Diagnosis not present

## 2024-07-03 DIAGNOSIS — W908XXA Exposure to other nonionizing radiation, initial encounter: Secondary | ICD-10-CM | POA: Diagnosis not present

## 2024-07-03 DIAGNOSIS — L820 Inflamed seborrheic keratosis: Secondary | ICD-10-CM | POA: Diagnosis not present

## 2024-07-03 DIAGNOSIS — L821 Other seborrheic keratosis: Secondary | ICD-10-CM

## 2024-07-03 DIAGNOSIS — Z86006 Personal history of melanoma in-situ: Secondary | ICD-10-CM | POA: Diagnosis not present

## 2024-07-03 DIAGNOSIS — D225 Melanocytic nevi of trunk: Secondary | ICD-10-CM | POA: Diagnosis not present

## 2024-07-03 DIAGNOSIS — D485 Neoplasm of uncertain behavior of skin: Secondary | ICD-10-CM

## 2024-07-03 DIAGNOSIS — D239 Other benign neoplasm of skin, unspecified: Secondary | ICD-10-CM

## 2024-07-03 DIAGNOSIS — Z85828 Personal history of other malignant neoplasm of skin: Secondary | ICD-10-CM

## 2024-07-03 DIAGNOSIS — D099 Carcinoma in situ, unspecified: Secondary | ICD-10-CM

## 2024-07-03 DIAGNOSIS — D1801 Hemangioma of skin and subcutaneous tissue: Secondary | ICD-10-CM

## 2024-07-03 DIAGNOSIS — L570 Actinic keratosis: Secondary | ICD-10-CM

## 2024-07-03 DIAGNOSIS — D229 Melanocytic nevi, unspecified: Secondary | ICD-10-CM

## 2024-07-03 HISTORY — DX: Carcinoma in situ, unspecified: D09.9

## 2024-07-03 HISTORY — DX: Other benign neoplasm of skin, unspecified: D23.9

## 2024-07-03 NOTE — Patient Instructions (Addendum)
 Wound Care Instructions  Cleanse wound gently with soap and water once a day then pat dry with clean gauze. Apply a thin coat of Petrolatum (petroleum jelly, Vaseline) over the wound (unless you have an allergy to this). We recommend that you use a new, sterile tube of Vaseline. Do not pick or remove scabs. Do not remove the yellow or white healing tissue from the base of the wound.  Cover the wound with fresh, clean, nonstick gauze and secure with paper tape. You may use Band-Aids in place of gauze and tape if the wound is small enough, but would recommend trimming much of the tape off as there is often too much. Sometimes Band-Aids can irritate the skin.  You should call the office for your biopsy report after 1 week if you have not already been contacted.  If you experience any problems, such as abnormal amounts of bleeding, swelling, significant bruising, significant pain, or evidence of infection, please call the office immediately.  FOR ADULT SURGERY PATIENTS: If you need something for pain relief you may take 1 extra strength Tylenol  (acetaminophen ) AND 2 Ibuprofen (200mg  each) together every 4 hours as needed for pain. (do not take these if you are allergic to them or if you have a reason you should not take them.) Typically, you may only need pain medication for 1 to 3 days.     Melanoma ABCDEs  Melanoma is the most dangerous type of skin cancer, and is the leading cause of death from skin disease.  You are more likely to develop melanoma if you: Have light-colored skin, light-colored eyes, or red or blond hair Spend a lot of time in the sun Tan regularly, either outdoors or in a tanning bed Have had blistering sunburns, especially during childhood Have a close family member who has had a melanoma Have atypical moles or large birthmarks  Early detection of melanoma is key since treatment is typically straightforward and cure rates are extremely high if we catch it early.   The  first sign of melanoma is often a change in a mole or a new dark spot.  The ABCDE system is a way of remembering the signs of melanoma.  A for asymmetry:  The two halves do not match. B for border:  The edges of the growth are irregular. C for color:  A mixture of colors are present instead of an even brown color. D for diameter:  Melanomas are usually (but not always) greater than 6mm - the size of a pencil eraser. E for evolution:  The spot keeps changing in size, shape, and color.  Please check your skin once per month between visits. You can use a small mirror in front and a large mirror behind you to keep an eye on the back side or your body.   If you see any new or changing lesions before your next follow-up, please call to schedule a visit.  Please continue daily skin protection including broad spectrum sunscreen SPF 30+ to sun-exposed areas, reapplying every 2 hours as needed when you're outdoors.    Due to recent changes in healthcare laws, you may see results of your pathology and/or laboratory studies on MyChart before the doctors have had a chance to review them. We understand that in some cases there may be results that are confusing or concerning to you. Please understand that not all results are received at the same time and often the doctors may need to interpret multiple results in order to  provide you with the best plan of care or course of treatment. Therefore, we ask that you please give us  2 business days to thoroughly review all your results before contacting the office for clarification. Should we see a critical lab result, you will be contacted sooner.   If You Need Anything After Your Visit  If you have any questions or concerns for your doctor, please call our main line at [phone] and press option 4 to reach your doctor's medical assistant. If no one answers, please leave a voicemail as directed and we will return your call as soon as possible. Messages left after 4  pm will be answered the following business day.   You may also send us  a message via MyChart. We typically respond to MyChart messages within 1-2 business days.  For prescription refills, please ask your pharmacy to contact our office. Our fax number is [phone].  If you have an urgent issue when the clinic is closed that cannot wait until the next business day, you can page your doctor at the number below.    Please note that while we do our best to be available for urgent issues outside of office hours, we are not available 24/7.   If you have an urgent issue and are unable to reach us , you may choose to seek medical care at your doctor's office, retail clinic, urgent care center, or emergency room.  If you have a medical emergency, please immediately call 911 or go to the emergency department.  Pager Numbers  - Dr. Hester: [phone]  - Dr. Jackquline: [phone]  - Dr. Claudene: [phone]   - Dr. Raymund: [phone]  In the event of inclement weather, please call our main line at [phone] for an update on the status of any delays or closures.  Dermatology Medication Tips: Please keep the boxes that topical medications come in in order to help keep track of the instructions about where and how to use these. Pharmacies typically print the medication instructions only on the boxes and not directly on the medication tubes.   If your medication is too expensive, please contact our office at [phone] option 4 or send us  a message through MyChart.   We are unable to tell what your co-pay for medications will be in advance as this is different depending on your insurance coverage. However, we may be able to find a substitute medication at lower cost or fill out paperwork to get insurance to cover a needed medication.   If a prior authorization is required to get your medication covered by your insurance company, please allow us  1-2 business days to complete this  process.  Drug prices often vary depending on where the prescription is filled and some pharmacies may offer cheaper prices.  The website www.goodrx.com contains coupons for medications through different pharmacies. The prices here do not account for what the cost may be with help from insurance (it may be cheaper with your insurance), but the website can give you the price if you did not use any insurance.  - You can print the associated coupon and take it with your prescription to the pharmacy.  - You may also stop by our office during regular business hours and pick up a GoodRx coupon card.  - If you need your prescription sent electronically to a different pharmacy, notify our office through [facility] or by phone at [phone] option 4.     Si Usted Necesita Algo Despus de Su Visita  Tambin puede enviarnos un mensaje a travs de MyChart. Por lo general respondemos a los mensajes de MyChart en el transcurso de 1 a 2 das hbiles.  Para renovar recetas, por favor pida a su farmacia que se ponga en contacto con nuestra oficina. Randi lakes de fax es [place] [phone].  Si tiene un asunto urgente cuando la clnica est cerrada y que no puede esperar hasta el siguiente da hbil, puede llamar/localizar a su doctor(a) al nmero que aparece a continuacin.   Por favor, tenga en cuenta que aunque hacemos todo lo posible para estar disponibles para asuntos urgentes fuera del horario de [place], no estamos disponibles las 24 horas del da, los 7 [address]  [address] de la [place].   Si tiene un problema urgente y no puede comunicarse con nosotros, puede optar por buscar atencin mdica  en el consultorio de su doctor(a), en una clnica privada, en un centro de atencin urgente o en una sala de emergencias.  Si tiene Engineer, drilling, por favor llame inmediatamente al 911 o vaya a la sala de emergencias.  Nmeros de bper  - Dr. Hester: [phone]  - Dra. Jackquline: [phone]  - Dr.  Claudene: [phone]  - Dra. Kitts: [phone]  En caso de inclemencias del [place], por favor llame a nuestra lnea principal al [phone] para una actualizacin sobre el estado de cualquier retraso o cierre.  Consejos para la medicacin en dermatologa: Por favor, guarde las cajas en las que vienen los medicamentos de uso tpico para ayudarle a seguir las instrucciones sobre dnde y cmo usarlos. Las farmacias generalmente imprimen las instrucciones del medicamento slo en las cajas y no directamente en los tubos del [place].   Si su medicamento es muy caro, por favor, pngase en contacto con landry rieger llamando al [phone] y presione la opcin 4 o envenos un mensaje a travs de Clinical cytogeneticist.   No podemos decirle cul ser su copago por los medicamentos por adelantado ya que esto es diferente dependiendo de la cobertura de su seguro. Sin embargo, es posible que podamos encontrar un medicamento sustituto a Audiological scientist un formulario para que el seguro cubra el medicamento que se considera necesario.   Si se requiere una autorizacin previa para que su compaa de seguros malta su medicamento, por favor permtanos de 1 a 2 das hbiles para completar este proceso.  Los precios de los medicamentos varan con frecuencia dependiendo del Environmental consultant de dnde se surte la receta y alguna farmacias pueden ofrecer precios ms baratos.  El sitio web www.goodrx.com tiene cupones para medicamentos de Health and safety inspector. Los precios aqu no tienen en cuenta lo que podra costar con la ayuda del seguro (puede ser ms barato con su seguro), pero el sitio web puede darle el precio si no utiliz Tourist information centre manager.  - Puede imprimir el cupn correspondiente y llevarlo con su receta a la farmacia.  - Tambin puede pasar por nuestra oficina durante el horario de atencin regular y Education officer, museum una tarjeta de cupones de GoodRx.  - Si necesita que su receta se enve electrnicamente a una farmacia diferente,  informe a nuestra oficina a travs de MyChart de [place] o por telfono llamando al [phone] y presione la opcin 4.

## 2024-07-03 NOTE — Progress Notes (Signed)
 Follow-Up Visit   Subjective  Brian [name] is a 89 y.o. male who presents for the following: Skin Cancer Screening and Full Body Skin Exam  The patient presents for Total-Body Skin Exam (TBSE) for skin cancer screening and mole check. The patient has spots, moles and lesions to be evaluated, some may be new or changing and the patient may have concern these could be cancer.  Hx MMis, SCC, BCC, AK.  The following portions of the chart were reviewed this encounter and updated as appropriate: medications, allergies, medical history  Review of Systems:  No other skin or systemic complaints except as noted in HPI or Assessment and Plan.  Objective  Well appearing patient in no apparent distress; mood and affect are within normal limits.  A full examination was performed including scalp, head, eyes, ears, nose, lips, neck, chest, axillae, abdomen, back, buttocks, bilateral upper extremities, bilateral lower extremities, hands, feet, fingers, toes, fingernails, and toenails. All findings within normal limits unless otherwise noted below.   Relevant physical exam findings are noted in the Assessment and Plan.  right lateral neck 1.1 cm pink scaly plaque ISK vs BCC  left lower distal anterior leg 1.0 cm keratotic plaque ISK vs SCC  Right Lower Back 7 mm pigmented macule  R antihelix x 1 Erythematous thin papules/macules with gritty scale.   Assessment & Plan   SKIN CANCER SCREENING PERFORMED TODAY.  ACTINIC DAMAGE - Chronic condition, secondary to cumulative UV/sun exposure - diffuse scaly erythematous macules with underlying dyspigmentation - Recommend daily broad spectrum sunscreen SPF 30+ to sun-exposed areas, reapply every 2 hours as needed.  - Staying in the shade or wearing long sleeves, sun glasses (UVA+UVB protection) and wide brim hats (4-inch brim around the entire circumference of the hat) are also recommended for sun protection.  - Call for new or changing  lesions.  LENTIGINES, SEBORRHEIC KERATOSES, HEMANGIOMAS - Benign normal skin lesions - Benign-appearing - Call for any changes  MELANOCYTIC NEVI - Tan-brown and/or pink-flesh-colored symmetric macules and papules - Benign appearing on exam today - Observation - Call clinic for new or changing moles - Recommend daily use of broad spectrum spf 30+ sunscreen to sun-exposed areas.   HISTORY OF BASAL CELL CARCINOMA OF THE SKIN - right helix, Mohs 07/19/2022 - No evidence of recurrence today - Recommend regular full body skin exams - Recommend daily broad spectrum sunscreen SPF 30+ to sun-exposed areas, reapply every 2 hours as needed.  - Call if any new or changing lesions are noted between office visits    HISTORY OF SQUAMOUS CELL CARCINOMA IN SITU OF THE SKIN - R medial calf superior, R medial calf inferior, EDC 01/23/2022 - No evidence of recurrence today - Recommend regular full body skin exams - Recommend daily broad spectrum sunscreen SPF 30+ to sun-exposed areas, reapply every 2 hours as needed.  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  - Call if any new or changing lesions are noted between office visits     NEOPLASM OF UNCERTAIN BEHAVIOR OF SKIN (3) right lateral neck Skin / nail biopsy Type of biopsy: tangential   Informed consent: discussed and consent obtained   Timeout: patient name, date of birth, surgical  site, and procedure verified   Procedure prep:  Patient was prepped and draped in usual sterile fashion Prep type:  Isopropyl alcohol Anesthesia: the lesion was anesthetized in a standard fashion   Anesthetic:  1% lidocaine  w/ epinephrine  1-100,000 buffered w/ 8.4% NaHCO3 Instrument used:  DermaBlade   Hemostasis achieved with: pressure and aluminum chloride   Outcome: patient tolerated procedure well   Post-procedure details: sterile dressing applied and wound care instructions given   Dressing type: bandage and petrolatum    Specimen 1 - Surgical pathology Differential Diagnosis: ISK vs BCC  Check Margins: No 1.1 cm pink scaly plaque  left lower distal anterior leg Skin / nail biopsy Type of biopsy: tangential   Informed consent: discussed and consent obtained   Timeout: patient name, date of birth, surgical site, and procedure verified   Procedure prep:  Patient was prepped and draped in usual sterile fashion Prep type:  Isopropyl alcohol Anesthesia: the lesion was anesthetized in a standard fashion   Anesthetic:  1% lidocaine  w/ epinephrine  1-100,000 buffered w/ 8.4% NaHCO3 Instrument used: DermaBlade   Hemostasis achieved with: pressure and aluminum chloride   Outcome: patient tolerated procedure well   Post-procedure details: sterile dressing applied and wound care instructions given   Dressing type: bandage and petrolatum    Specimen 2 - Surgical pathology Differential Diagnosis: ISK vs SCC  Check Margins: No 1.0 cm keratotic plaque  Right Lower Back Skin / nail biopsy Type of biopsy: tangential   Informed consent: discussed and consent obtained   Timeout: patient name, date of birth, surgical site, and procedure verified   Procedure prep:  Patient was prepped and draped in usual sterile fashion Prep type:  Isopropyl alcohol Anesthesia: the lesion was anesthetized in a standard fashion   Anesthetic:  1% lidocaine  w/ epinephrine  1-100,000 buffered w/ 8.4% NaHCO3 Instrument used: DermaBlade   Hemostasis achieved with: pressure and aluminum chloride   Outcome: patient tolerated procedure well   Post-procedure details: sterile dressing applied and wound care instructions given   Dressing type: bandage and petrolatum    Specimen 3 - Surgical  pathology Differential Diagnosis: Dysplastic Nevus vs Melanoma  Check Margins: No 7 mm pigmented macule AK (ACTINIC KERATOSIS) R antihelix x 1 Actinic keratoses are precancerous spots that appear secondary to cumulative UV radiation exposure/sun exposure over time. They are chronic with expected duration over 1 year. A portion of actinic keratoses will progress to squamous cell carcinoma of the skin. It is not possible to reliably predict which spots will progress to skin cancer and so treatment is recommended to prevent development of skin cancer.  Recommend daily broad spectrum sunscreen SPF 30+ to sun-exposed areas, reapply every 2 hours as needed.  Recommend staying in the shade or wearing long sleeves, sun glasses (UVA+UVB protection) and wide brim hats (4-inch brim around the entire circumference of the hat). Call for new or changing lesions. Destruction of lesion - R antihelix x 1 Complexity: simple   Destruction method: cryotherapy   Informed consent: discussed and consent obtained   Timeout:  patient name, date of birth, surgical site, and procedure verified Lesion destroyed using liquid nitrogen: Yes   Region frozen until ice ball extended beyond lesion: Yes   Cryo cycles: 1 or 2. Outcome: patient tolerated procedure well with no complications   Post-procedure details: wound care instructions given    MULTIPLE BENIGN NEVI   LENTIGINES   ACTINIC ELASTOSIS   SEBORRHEIC KERATOSES   CHERRY ANGIOMA   Return in about  6 months (around 01/03/2025) for TBSE, with Dr. Claudene, HxSCC, HxMMis, HxBCC, HxAK.  LILLETTE Lonell Drones, RMA, am acting as scribe for Boneta Claudene, MD .   Documentation: I have reviewed the above documentation for accuracy and completeness, and I agree with the above.  Boneta Claudene, MD

## 2024-07-07 ENCOUNTER — Ambulatory Visit (INDEPENDENT_AMBULATORY_CARE_PROVIDER_SITE_OTHER): Payer: Medicare Other | Admitting: Family Medicine

## 2024-07-07 ENCOUNTER — Encounter: Payer: Self-pay | Admitting: Family Medicine

## 2024-07-07 VITALS — BP 149/75 | HR 59 | Ht 68.0 in | Wt 119.8 lb

## 2024-07-07 DIAGNOSIS — F419 Anxiety disorder, unspecified: Secondary | ICD-10-CM

## 2024-07-07 DIAGNOSIS — I2510 Atherosclerotic heart disease of native coronary artery without angina pectoris: Secondary | ICD-10-CM | POA: Diagnosis not present

## 2024-07-07 DIAGNOSIS — K219 Gastro-esophageal reflux disease without esophagitis: Secondary | ICD-10-CM

## 2024-07-07 DIAGNOSIS — E039 Hypothyroidism, unspecified: Secondary | ICD-10-CM

## 2024-07-07 DIAGNOSIS — I129 Hypertensive chronic kidney disease with stage 1 through stage 4 chronic kidney disease, or unspecified chronic kidney disease: Secondary | ICD-10-CM

## 2024-07-07 DIAGNOSIS — F32A Depression, unspecified: Secondary | ICD-10-CM

## 2024-07-07 DIAGNOSIS — E782 Mixed hyperlipidemia: Secondary | ICD-10-CM

## 2024-07-07 DIAGNOSIS — I119 Hypertensive heart disease without heart failure: Secondary | ICD-10-CM

## 2024-07-07 MED ORDER — METOPROLOL SUCCINATE ER 25 MG PO TB24
25.0000 mg | ORAL_TABLET | Freq: Every day | ORAL | 1 refills | Status: DC
Start: 2024-07-07 — End: 2024-10-08

## 2024-07-07 MED ORDER — ATORVASTATIN CALCIUM 80 MG PO TABS: ORAL_TABLET | ORAL | 1 refills | Status: DC

## 2024-07-07 MED ORDER — SUCRALFATE 1 G PO TABS
1.0000 g | ORAL_TABLET | Freq: Three times a day (TID) | ORAL | 1 refills | Status: DC
Start: 2024-07-07 — End: 2024-10-08

## 2024-07-07 MED ORDER — NITROGLYCERIN 0.4 MG SL SUBL: SUBLINGUAL_TABLET | SUBLINGUAL | 12 refills | Status: AC

## 2024-07-07 MED ORDER — VITAMIN D (ERGOCALCIFEROL) 1.25 MG (50000 UNIT) PO CAPS: 50000.0000 [IU] | ORAL_CAPSULE | ORAL | 1 refills | Status: AC

## 2024-07-07 MED ORDER — LOSARTAN POTASSIUM 25 MG PO TABS: 12.5000 mg | ORAL_TABLET | Freq: Every day | ORAL | 1 refills | Status: DC

## 2024-07-07 NOTE — Assessment & Plan Note (Signed)
 Rechecking labs today. Await results. Treat as needed.

## 2024-07-07 NOTE — Assessment & Plan Note (Signed)
Will keep BP and cholesterol under good control. Call with any concerns.  ?

## 2024-07-07 NOTE — Assessment & Plan Note (Signed)
 Under good control on current regimen. Continue current regimen. Continue to monitor. Call with any concerns. Refills given. Labs drawn today.

## 2024-07-07 NOTE — Progress Notes (Signed)
 BP (!) 149/75 (BP Location: Left Arm, Cuff Size: Normal)   Pulse (!) 59   Ht 5' 8 (1.727 m)   Wt 119 lb 12.8 oz (54.3 kg)   SpO2 94%   BMI 18.22 kg/m    Subjective:    Patient ID: Brian [name], male    DOB: July 24, 1933, 89 y.o.   MRN: [id number]  HPI: Brian [name] is a 89 y.o. male  Chief Complaint  Patient presents with   Hypothyroidism   HYPOTHYROIDISM Thyroid  control status:controlled Satisfied with current treatment? yes Medication side effects: no Medication compliance: excellent compliance Recent dose adjustment:no Fatigue: no Cold intolerance: no Heat intolerance: no Weight gain: no Weight loss: no Constipation: no Diarrhea/loose stools: no Palpitations: no Lower extremity edema: no Anxiety/depressed mood: no  HYPERTENSION / HYPERLIPIDEMIA Satisfied with current treatment? yes Duration of hypertension: chronic BP monitoring frequency: not checking BP medication side effects: no Past BP meds: metoprolol , losartan  Duration of hyperlipidemia: chronic Cholesterol medication side effects: no Cholesterol supplements: none Past cholesterol medications: atorvastatin  Medication compliance: excellent compliance Aspirin : yes Recent stressors: no Recurrent headaches: no Visual changes: no Palpitations: no Dyspnea: no Chest pain: no Lower extremity edema: no Dizzy/lightheaded: no  Relevant past medical, surgical, family and social history reviewed and updated as indicated. Interim medical history since our last visit reviewed. Allergies and medications reviewed and updated.  Review of Systems  Constitutional:  Positive for fatigue. Negative for activity change, appetite change, chills, diaphoresis, fever and unexpected weight change.  Respiratory: Negative.    Cardiovascular: Negative.   Gastrointestinal:  Positive for abdominal pain, diarrhea and nausea. Negative for abdominal distention, anal bleeding, blood in stool, constipation, rectal pain and  vomiting.  Musculoskeletal: Negative.   Skin: Negative.   Psychiatric/Behavioral: Negative.      Per HPI unless specifically indicated above     Objective:    BP (!) 149/75 (BP Location: Left Arm, Cuff Size: Normal)   Pulse (!) 59   Ht 5' 8 (1.727 m)   Wt 119 lb 12.8 oz (54.3 kg)   SpO2 94%   BMI 18.22 kg/m   Wt Readings from Last 3 Encounters:  07/07/24 119 lb 12.8 oz (54.3 kg)  05/15/24 119 lb (54 kg)  05/05/24 119 lb 14.9 oz (54.4 kg)    Physical Exam Vitals and nursing note reviewed.  Constitutional:      General: He is not in acute distress.    Appearance: Normal appearance. He is not ill-appearing, toxic-appearing or diaphoretic.  HENT:     Head: Normocephalic and atraumatic.     Right Ear: External ear normal.     Left Ear: External ear normal.     Nose: Nose normal.     Mouth/Throat:     Mouth: Mucous membranes are moist.     Pharynx: Oropharynx is clear.  Eyes:     General: No scleral icterus.       Right eye: No discharge.        Left eye: No discharge.     Extraocular Movements: Extraocular movements intact.     Conjunctiva/sclera: Conjunctivae normal.     Pupils: Pupils are equal, round, and reactive to light.  Cardiovascular:     Rate and Rhythm: Normal rate and regular rhythm.     Pulses: Normal pulses.     Heart sounds: Normal heart sounds. No murmur heard.    No friction rub. No gallop.  Pulmonary:     Effort: Pulmonary effort is normal. No respiratory  distress.     Breath sounds: Normal breath sounds. No stridor. No wheezing, rhonchi or rales.  Chest:     Chest wall: No tenderness.  Musculoskeletal:        General: Normal range of motion.     Cervical back: Normal range of motion and neck supple.  Skin:    General: Skin is warm and dry.     Capillary Refill: Capillary refill takes less than 2 seconds.     Coloration: Skin is not jaundiced or pale.     Findings: No bruising, erythema, lesion or rash.  Neurological:     General: No focal  deficit present.     Mental Status: He is alert and oriented to person, place, and time. Mental status is at baseline.  Psychiatric:        Mood and Affect: Mood normal.        Behavior: Behavior normal.        Thought Content: Thought content normal.        Judgment: Judgment normal.     Results for orders placed or performed in visit on 05/15/24  Comp Met (CMET)   Collection Time: 05/15/24 10:05 AM  Result Value Ref Range   Glucose 98 70 - 99 mg/dL   BUN 10 8 - 27 mg/dL   Creatinine, Ser 9.14 0.76 - 1.27 mg/dL   eGFR 84 >40 fO/fpw/8.26   BUN/Creatinine Ratio 12 10 - 24   Sodium 140 134 - 144 mmol/L   Potassium 4.3 3.5 - 5.2 mmol/L   Chloride 103 96 - 106 mmol/L   CO2 24 20 - 29 mmol/L   Calcium  9.4 8.6 - 10.2 mg/dL   Total Protein 6.9 6.0 - 8.5 g/dL   Albumin 4.3 3.7 - 4.7 g/dL   Globulin, Total 2.6 1.5 - 4.5 g/dL   Bilirubin Total 0.6 0.0 - 1.2 mg/dL   Alkaline Phosphatase 78 44 - 121 IU/L   AST 19 0 - 40 IU/L   ALT 12 0 - 44 IU/L  CBC w/Diff   Collection Time: 05/15/24 10:05 AM  Result Value Ref Range   WBC 6.6 3.4 - 10.8 x10E3/uL   RBC 3.83 (L) 4.14 - 5.80 x10E6/uL   Hemoglobin 12.6 (L) 13.0 - 17.7 g/dL   Hematocrit 61.5 62.4 - 51.0 %   MCV 100 (H) 79 - 97 fL   MCH 32.9 26.6 - 33.0 pg   MCHC 32.8 31.5 - 35.7 g/dL   RDW 87.6 88.3 - 84.5 %   Platelets 198 150 - 450 x10E3/uL   Neutrophils 55 Not Estab. %   Lymphs 32 Not Estab. %   Monocytes 8 Not Estab. %   Eos 4 Not Estab. %   Basos 1 Not Estab. %   Neutrophils Absolute 3.7 1.4 - 7.0 x10E3/uL   Lymphocytes Absolute 2.1 0.7 - 3.1 x10E3/uL   Monocytes Absolute 0.5 0.1 - 0.9 x10E3/uL   EOS (ABSOLUTE) 0.3 0.0 - 0.4 x10E3/uL   Basophils Absolute 0.0 0.0 - 0.2 x10E3/uL   Immature Granulocytes 0 Not Estab. %   Immature Grans (Abs) 0.0 0.0 - 0.1 x10E3/uL      Assessment & Plan:   Problem List Items Addressed This Visit       Cardiovascular and Mediastinum   CAD (coronary artery disease)   Will keep BP and  cholesterol under good control. Call with any concerns.       Relevant Medications   atorvastatin  (LIPITOR ) 80 MG tablet   losartan  (  COZAAR ) 25 MG tablet   metoprolol  succinate (TOPROL -XL) 25 MG 24 hr tablet   nitroGLYCERIN  (NITROSTAT ) 0.4 MG SL tablet   Hypertension with heart disease   Under good control on current regimen. Continue current regimen. Continue to monitor. Call with any concerns. Refills given. Labs drawn today.       Relevant Medications   atorvastatin  (LIPITOR ) 80 MG tablet   losartan  (COZAAR ) 25 MG tablet   metoprolol  succinate (TOPROL -XL) 25 MG 24 hr tablet   nitroGLYCERIN  (NITROSTAT ) 0.4 MG SL tablet     Digestive   GERD (gastroesophageal reflux disease)   Acting up. Stomach upset. Will start carafate . Call with any concerns. Continue to monitor.       Relevant Medications   sucralfate  (CARAFATE ) 1 g tablet   Other Relevant Orders   CBC with Differential/Platelet     Endocrine   Acquired hypothyroidism   Rechecking labs today. Await results. Treat as needed.      Relevant Medications   metoprolol  succinate (TOPROL -XL) 25 MG 24 hr tablet   Other Relevant Orders   TSH     Genitourinary   Benign hypertensive renal disease   Under good control on current regimen. Continue current regimen. Continue to monitor. Call with any concerns. Refills given. Labs drawn today.        Relevant Orders   CBC with Differential/Platelet   Comprehensive metabolic panel with GFR     Other   Anxiety and depression - Primary   Stable. Continue to monitor. Call with any concerns.       Relevant Orders   CBC with Differential/Platelet   Mixed hyperlipidemia   Under good control on current regimen. Continue current regimen. Continue to monitor. Call with any concerns. Refills given. Labs drawn today.       Relevant Medications   atorvastatin  (LIPITOR ) 80 MG tablet   losartan  (COZAAR ) 25 MG tablet   metoprolol  succinate (TOPROL -XL) 25 MG 24 hr tablet    nitroGLYCERIN  (NITROSTAT ) 0.4 MG SL tablet   Other Relevant Orders   CBC with Differential/Platelet   Lipid Panel w/o Chol/HDL Ratio   Comprehensive metabolic panel with GFR     Follow up plan: Return in about 3 months (around 10/07/2024).

## 2024-07-07 NOTE — Assessment & Plan Note (Signed)
 Stable. Continue to monitor. Call with any concerns.  ?

## 2024-07-07 NOTE — Assessment & Plan Note (Signed)
 Acting up. Stomach upset. Will start carafate . Call with any concerns. Continue to monitor.

## 2024-07-08 ENCOUNTER — Ambulatory Visit: Payer: Self-pay

## 2024-07-08 VITALS — Ht 68.0 in | Wt 119.0 lb

## 2024-07-08 DIAGNOSIS — Z0000 Encounter for general adult medical examination without abnormal findings: Secondary | ICD-10-CM | POA: Diagnosis not present

## 2024-07-08 LAB — CBC WITH DIFFERENTIAL/PLATELET
Basophils Absolute: 0 x10E3/uL (ref 0.0–0.2)
Basos: 1 %
EOS (ABSOLUTE): 0.2 x10E3/uL (ref 0.0–0.4)
Eos: 3 %
Hematocrit: 41.9 % (ref 37.5–51.0)
Hemoglobin: 13.8 g/dL (ref 13.0–17.7)
Immature Grans (Abs): 0 x10E3/uL (ref 0.0–0.1)
Immature Granulocytes: 0 %
Lymphocytes Absolute: 2.3 x10E3/uL (ref 0.7–3.1)
Lymphs: 38 %
MCH: 32.4 pg (ref 26.6–33.0)
MCHC: 32.9 g/dL (ref 31.5–35.7)
MCV: 98 fL — ABNORMAL HIGH (ref 79–97)
Monocytes Absolute: 0.5 x10E3/uL (ref 0.1–0.9)
Monocytes: 8 %
Neutrophils Absolute: 3 x10E3/uL (ref 1.4–7.0)
Neutrophils: 50 %
Platelets: 157 x10E3/uL (ref 150–450)
RBC: 4.26 x10E6/uL (ref 4.14–5.80)
RDW: 12.3 % (ref 11.6–15.4)
WBC: 6 x10E3/uL (ref 3.4–10.8)

## 2024-07-08 LAB — COMPREHENSIVE METABOLIC PANEL WITH GFR
ALT: 9 IU/L (ref 0–44)
AST: 14 IU/L (ref 0–40)
Albumin: 4.1 g/dL (ref 3.7–4.7)
Alkaline Phosphatase: 78 IU/L (ref 44–121)
BUN/Creatinine Ratio: 6 — ABNORMAL LOW (ref 10–24)
BUN: 5 mg/dL — ABNORMAL LOW (ref 8–27)
Bilirubin Total: 0.5 mg/dL (ref 0.0–1.2)
CO2: 25 mmol/L (ref 20–29)
Calcium: 9.4 mg/dL (ref 8.6–10.2)
Chloride: 104 mmol/L (ref 96–106)
Creatinine, Ser: 0.83 mg/dL (ref 0.76–1.27)
Globulin, Total: 2.6 g/dL (ref 1.5–4.5)
Glucose: 112 mg/dL — ABNORMAL HIGH (ref 70–99)
Potassium: 4 mmol/L (ref 3.5–5.2)
Sodium: 141 mmol/L (ref 134–144)
Total Protein: 6.7 g/dL (ref 6.0–8.5)
eGFR: 84 mL/min/1.73 (ref >59)

## 2024-07-08 LAB — LIPID PANEL W/O CHOL/HDL RATIO
Cholesterol, Total: 151 mg/dL (ref 100–199)
HDL: 85 mg/dL (ref >39)
LDL Chol Calc (NIH): 51 mg/dL (ref 0–99)
Triglycerides: 76 mg/dL (ref 0–149)
VLDL Cholesterol Cal: 15 mg/dL (ref 5–40)

## 2024-07-08 LAB — SURGICAL PATHOLOGY

## 2024-07-08 LAB — TSH: TSH: 4.34 u[IU]/mL (ref 0.450–4.500)

## 2024-07-08 NOTE — Progress Notes (Signed)
 Subjective:   Brian [name] is a 89 y.o. who presents for a Medicare Wellness preventive visit.  As a reminder, Annual Wellness Visits don't include a physical exam, and some assessments may be limited, especially if this visit is performed virtually. We may recommend an in-person follow-up visit with your provider if needed.  Visit Complete: Virtual I connected with  Brian [name] on 07/08/24 by a audio enabled telemedicine application and verified that I am speaking with the correct person using two identifiers.  Patient Location: Home  Provider Location: Office/Clinic  I discussed the limitations of evaluation and management by telemedicine. The patient expressed understanding and agreed to proceed.  Vital Signs: Because this visit was a virtual/telehealth visit, some criteria may be missing or patient reported. Any vitals not documented were not able to be obtained and vitals that have been documented are patient reported.  VideoDeclined- This patient declined Librarian, academic. Therefore the visit was completed with audio only.  Persons Participating in Visit: Patient.  AWV Questionnaire: No: Patient Medicare AWV questionnaire was not completed prior to this visit.  Cardiac Risk Factors include: advanced age (>75men, >45 women);hypertension;male gender     Objective:    Today's Vitals   07/08/24 1332  Weight: 119 lb (54 kg)  Height: 5' 8 (1.727 m)   Body mass index is 18.09 kg/m.     07/08/2024    1:38 PM 05/05/2024   10:26 AM 05/01/2024    9:17 AM 07/03/2023    2:36 PM 06/28/2022    9:05 AM 06/27/2021    9:01 AM 06/25/2020    9:02 AM  Advanced Directives  Does Patient Have a Medical Advance Directive? Yes Yes No Yes Yes Yes Yes  Type of Estate agent of [place];Living will Living will  Healthcare Power of [place];Living will Living will Healthcare Power of [place];Living will Healthcare Power of [place];Living  will  Does patient want to make changes to medical advance directive?    No - Patient declined     Copy of Healthcare Power of Attorney in Chart? No - copy requested   No - copy requested  No - copy requested No - copy requested  Would patient like information on creating a medical advance directive?  No - Patient declined No - Patient declined        Current Medications (verified) Outpatient Encounter Medications as of 07/08/2024  Medication Sig   aspirin  EC 81 MG tablet Take 1 tablet (81 mg total) by mouth daily. Swallow whole.   atorvastatin  (LIPITOR ) 80 MG tablet TAKE 1 TABLET BY MOUTH ONCE DAILY AT  6PM   clopidogrel  (PLAVIX ) 75 MG tablet Take 75 mg by mouth daily.   cyanocobalamin  1000 MCG tablet Take 1 tablet (1,000 mcg total) by mouth daily.   diazepam  (VALIUM ) 5 MG tablet Take 5 mg by mouth every 8 (eight) hours as needed (for anxiety).   levothyroxine  (SYNTHROID ) 25 MCG tablet Take 1 tablet (25 mcg total) by mouth daily.   losartan  (COZAAR ) 25 MG tablet Take 0.5 tablets (12.5 mg total) by mouth daily.   metoprolol  succinate (TOPROL -XL) 25 MG 24 hr tablet Take 1 tablet (25 mg total) by mouth daily.   mupirocin  ointment (BACTROBAN ) 2 % Apply 1 Application topically daily.   nitroGLYCERIN  (NITROSTAT ) 0.4 MG SL tablet PLACE ONE TABLET UNDER THE TONGUE EVERY 5 MINUTES AS NEEDED FOR CHEST PAIN   sucralfate  (CARAFATE ) 1 g tablet Take 1 tablet (1 g total) by  mouth 4 (four) times daily -  with meals and at bedtime.   traMADol  (ULTRAM ) 50 MG tablet Take 1 tablet (50 mg total) by mouth every 4 (four) hours as needed.   Vitamin D , Ergocalciferol , (DRISDOL ) 1.25 MG (50000 UNIT) CAPS capsule Take 1 capsule (50,000 Units total) by mouth every 7 (seven) days.   No facility-administered encounter medications on file as of 07/08/2024.    Allergies (verified) Zoloft  [sertraline  hcl]   History: Past Medical History:  Diagnosis Date   AAA (abdominal aortic aneurysm) (HCC)    2.9 CM infrarenal AAA  on CT 11/2011   Alcoholism (HCC)    Anxiety    panic attacks, rare.   Basal cell carcinoma 06/21/2022   Right helix. 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    Gastritis 03/2012   GERD (gastroesophageal reflux disease)    GI bleed    HFrEF (heart failure with reduced ejection fraction) (HCC)    HOH (hard of hearing)    Hyperlipemia    Hypertension    Hypovolemic shock (HCC)    Kidney stones    Melanoma in situ (HCC) 06/21/2022   Right preauricular, Mohs 07/19/22   Myocardial infarction (HCC) 2015   Panic attacks    Positive H. pylori titer ?, before 2010   treated with Abx.    Squamous cell carcinoma of skin 12/28/2021   SCC IS, R medial calf superior, EDC 01/23/22   Squamous cell carcinoma of skin 12/28/2021   SCC IS, R medial calf inferior, EDC 01/23/22   Tubular adenoma of colon    Past Surgical History:  Procedure Laterality Date   CARDIAC CATHETERIZATION  2015   Stents   CATARACT EXTRACTION W/PHACO Right 09/21/2016   Procedure: CATARACT EXTRACTION PHACO AND INTRAOCULAR LENS PLACEMENT (IOC);  Surgeon: Adine Oneil Novak, MD;  Location: [facility] ORS;  Service: Ophthalmology;  Laterality: Right;  US  1.10 AP% 13.8 CDE 9.93 Fluid pack lot # [id number]   CATARACT EXTRACTION W/PHACO Left 12/07/2016   Procedure: CATARACT EXTRACTION PHACO AND INTRAOCULAR LENS PLACEMENT (IOC);  Surgeon: Adine Oneil Novak, MD;  Location: [facility] ORS;  Service: Ophthalmology;  Laterality: Left;  us  00:42.7 ap 13.7 cde 5.85 lot #[id number]   COLONOSCOPY N/A 05/05/2024   Procedure: COLONOSCOPY;  Surgeon: Jinny Carmine, MD;  Location: [facility] ENDOSCOPY;  Service: Endoscopy;  Laterality: N/A;   COLONOSCOPY WITH PROPOFOL  Left 06/06/2015   Procedure: COLONOSCOPY WITH PROPOFOL ;  Surgeon: Deward CINDERELLA Piedmont, MD;  Location: [facility] ENDOSCOPY;  Service: Endoscopy;  Laterality: Left;   CORONARY  ANGIOPLASTY     2015   LEFT HEART CATHETERIZATION WITH CORONARY ANGIOGRAM Bilateral 05/29/2014   Procedure: LEFT HEART CATHETERIZATION WITH CORONARY ANGIOGRAM;  Surgeon: Erick JONELLE Bergamo, MD;  Location: [facility] CATH LAB;  Service: Cardiovascular;  Laterality: Bilateral;   PERCUTANEOUS CORONARY STENT INTERVENTION (PCI-S)  05/29/2014   Procedure: PERCUTANEOUS CORONARY STENT INTERVENTION (PCI-S);  Surgeon: Erick JONELLE Bergamo, MD;  Location: [facility] CATH LAB;  Service: Cardiovascular;;  DES x2 Prox and Mid LAD    Family History  Problem Relation Age of Onset   Heart disease Mother    Ulcers Father        stomach    Heart disease Father    Anuerysm Brother    Leukemia Brother    Heart disease Paternal Grandmother    Heart attack  Paternal Grandmother    Heart disease Paternal Grandfather    Hypertension Daughter    Colon cancer Neg Hx    Social History   Socioeconomic History   Marital status: Married    Spouse name: Candis   Number of children: 4   Years of education: Not on file   Highest education level: 12th grade  Occupational History   Occupation: semi retired  Tobacco Use   Smoking status: Former    Current packs/day: 0.00    Average packs/day: 0.3 packs/day for 56.7 years (14.2 ttl pk-yrs)    Types: Cigarettes, Cigars    Start date: 06/1966    Quit date: 03/2023    Years since quitting: 1.3   Smokeless tobacco: Never   Tobacco comments:    4 cigarettes per day, restarted and quit ~03/2023  Vaping Use   Vaping status: Never Used  Substance and Sexual Activity   Alcohol use: No    Alcohol/week: 0.0 standard drinks of alcohol   Drug use: No   Sexual activity: Not Currently  Other Topics Concern   Not on file  Social History Narrative   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    Social Drivers of Health   Financial Resource Strain: Low Risk  (07/08/2024)   Overall Financial Resource Strain (CARDIA)    Difficulty of Paying Living Expenses: Not hard at all  Food  Insecurity: No Food Insecurity (07/08/2024)   Hunger Vital Sign    Worried About Running Out of Food in the Last Year: Never true    Ran Out of Food in the Last Year: Never true  Transportation Needs: No Transportation Needs (07/08/2024)   PRAPARE - Administrator, Civil Service (Medical): No    Lack of Transportation (Non-Medical): No  Physical Activity: Inactive (07/08/2024)   Exercise Vital Sign    Days of Exercise per Week: 0 days    Minutes of Exercise per Session: 0 min  Stress: No Stress Concern Present (07/08/2024)   [employer] of Occupational Health - Occupational Stress Questionnaire    Feeling of Stress: Only a little  Social Connections: Moderately Integrated (07/08/2024)   Social Connection and Isolation Panel    Frequency of Communication with Friends and Family: More than three times a week    Frequency of Social Gatherings with Friends and Family: More than three times a week    Attends Religious Services: 1 to 4 times per year    Active Member of [employer] or Organizations: No    Attends Banker Meetings: Never    Marital Status: Married    Tobacco Counseling Counseling given: Not Answered Tobacco comments: 4 cigarettes per day, restarted and quit ~03/2023    Clinical Intake:  Pre-visit preparation completed: Yes  Pain : No/denies pain     BMI - recorded: 18.09 Nutritional Status: BMI <19  Underweight Nutritional Risks: None Diabetes: No  No results found for: HGBA1C   How often do you need to have someone help you when you read instructions, pamphlets, or other written materials from your doctor or pharmacy?: 1 - Never  Interpreter Needed?: No  Information entered by :: Ellouise Haws, LPN   Activities of Daily Living     07/08/2024    1:34 PM 05/01/2024    6:16 PM  In your present state of health, do you have any difficulty performing the following activities:  Hearing? 1 1  Comment hearing aids   Vision?  0 0   Difficulty concentrating or making decisions? 0 0  Walking or climbing stairs? 0   Dressing or bathing? 0   Doing errands, shopping? 0   Preparing Food and eating ? N   Using the Toilet? N   In the past six months, have you accidently leaked urine? N   Do you have problems with loss of bowel control? Y   Comment at times wear a pad   Managing your Medications? N   Managing your Finances? N   Housekeeping or managing your Housekeeping? N     Patient Care Team: Vicci Duwaine SQUIBB, DO as PCP - General (Family Medicine) Fernand Grumbles, MD as Referring Physician (Anesthesiology) Pa, [facility] (Optometry) Claudene Lehmann, MD as Referring Physician (Dermatology) Elmira Newman PARAS, MD as Consulting Physician (Cardiology)  I have updated your Care Teams any recent Medical Services you may have received from other providers in the past year.     Assessment:   This is a routine wellness examination for Bayani.  Hearing/Vision screen Hearing Screening - Comments:: Hearing aids  Vision Screening - Comments:: Wears rx glasses - up to date with routine eye exams with [facility]    Goals Addressed             This Visit's Progress    Patient Stated       Maintain health and activity        Depression Screen     07/08/2024    1:38 PM 07/07/2024    8:41 AM 04/11/2024   11:02 AM 01/08/2024    8:51 AM 08/07/2023    9:10 AM 07/03/2023    2:33 PM 06/20/2023    8:45 AM  PHQ 2/9 Scores  PHQ - 2 Score 1 0 0 0 0 1 1  PHQ- 9 Score 1 2 0 0 1 1 3     Fall Risk     07/08/2024    1:40 PM 01/08/2024    8:51 AM 08/07/2023    9:09 AM 07/03/2023    2:39 PM 06/20/2023    8:45 AM  Fall Risk   Falls in the past year? 0 0 1 1 1   Number falls in past yr: 0 0 1 0 1  Injury with Fall? 0 0 0 0 0  Risk for fall due to : No Fall Risks No Fall Risks History of fall(s) History of fall(s)   Follow up Falls prevention discussed Falls evaluation completed Falls evaluation completed Falls  prevention discussed;Falls evaluation completed     MEDICARE RISK AT HOME:  Medicare Risk at Home Any stairs in or around the home?: Yes If so, are there any without handrails?: No Home free of loose throw rugs in walkways, pet beds, electrical cords, etc?: Yes Adequate lighting in your home to reduce risk of falls?: Yes Life alert?: No Use of a cane, walker or w/c?: No Grab bars in the bathroom?: Yes Shower chair or bench in shower?: No Elevated toilet seat or a handicapped toilet?: No  TIMED UP AND GO:  Was the test performed?  No  Cognitive Function: 6CIT completed        07/08/2024    1:41 PM 07/03/2023    2:41 PM 06/28/2022    9:07 AM 06/27/2021    9:05 AM 06/25/2020    9:07 AM  6CIT Screen  What Year? 0 points 0 points 0 points 0 points 0 points  What month? 0 points 0 points 0 points 0  points 0 points  What time? 0 points 0 points 0 points 0 points 0 points  Count back from 20 0 points 0 points 0 points 0 points 0 points  Months in reverse 0 points 2 points 0 points 0 points 0 points  Repeat phrase 0 points 0 points 2 points 0 points 2 points  Total Score 0 points 2 points 2 points 0 points 2 points    Immunizations Immunization History  Administered Date(s) Administered   Fluad Quad(high Dose 65+) 10/02/2019, 08/20/2020, 08/29/2021, 10/09/2022   Fluad Trivalent(High Dose 65+) 08/07/2023   INFLUENZA, HIGH DOSE SEASONAL PF 07/22/2018, 08/20/2020, 08/29/2021   Influenza-Unspecified 07/15/2015, 07/14/2017, 10/09/2022   PFIZER(Purple Top)SARS-COV-2 Vaccination 12/05/2019, 12/26/2019, 08/20/2020   Pfizer Covid-19 Vaccine Bivalent Booster 83yrs & up 08/29/2021   Pneumococcal Conjugate-13 12/18/2016   Pneumococcal Polysaccharide-23 12/25/2017   Td 12/15/2020    Screening Tests Health Maintenance  Topic Date Due   Zoster Vaccines- Shingrix (1 of 2) Never done   INFLUENZA VACCINE  06/13/2024   Medicare Annual Wellness (AWV)  07/08/2025   DTaP/Tdap/Td (2 - Tdap)  12/15/2030   Pneumococcal Vaccine: 50+ Years  Completed   HPV VACCINES  Aged Out   Meningococcal B Vaccine  Aged Out   COVID-19 Vaccine  Discontinued    Health Maintenance  Health Maintenance Due  Topic Date Due   Zoster Vaccines- Shingrix (1 of 2) Never done   INFLUENZA VACCINE  06/13/2024   Health Maintenance Items Addressed: See Nurse Notes at the end of this note  Additional Screening:  Vision Screening: Recommended annual ophthalmology exams for early detection of glaucoma and other disorders of the eye. Would you like a referral to an eye doctor? No    Dental Screening: Recommended annual dental exams for proper oral hygiene  Community Resource Referral / Chronic Care Management: CRR required this visit?  No   CCM required this visit?  No   Plan:    I have personally reviewed and noted the following in the patient's chart:   Medical and social history Use of alcohol, tobacco or illicit drugs  Current medications and supplements including opioid prescriptions. Patient is not currently taking opioid prescriptions. Functional ability and status Nutritional status Physical activity Advanced directives List of other physicians Hospitalizations, surgeries, and ER visits in previous 12 months Vitals Screenings to include cognitive, depression, and falls Referrals and appointments  In addition, I have reviewed and discussed with patient certain preventive protocols, quality metrics, and best practice recommendations. A written personalized care plan for preventive services as well as general preventive health recommendations were provided to patient.   Ellouise VEAR Haws, LPN   [id number]   After Visit Summary: (MyChart) Due to this being a telephonic visit, the after visit summary with patients personalized plan was offered to patient via MyChart   Notes: Nothing significant to report at this time.

## 2024-07-08 NOTE — Patient Instructions (Signed)
 [name] , Thank you for taking time out of your busy schedule to complete your Annual Wellness Visit with me. I enjoyed our conversation and look forward to speaking with you again next year. I, as well as your care team,  appreciate your ongoing commitment to your health goals. Please review the following plan we discussed and let me know if I can assist you in the future. Your Game plan/ To Do List    Referrals: If you haven't heard from the office you've been referred to, please reach out to them at the phone provided.   Follow up Visits: We will see or speak with you next year for your Next Medicare AWV with our clinical staff Have you seen your provider in the last 6 months (3 months if uncontrolled diabetes)? Yes  Clinician Recommendations:  Aim for 30 minutes of exercise or brisk walking, 6-8 glasses of water, and 5 servings of fruits and vegetables each day.       This is a list of the screenings recommended for you:  Health Maintenance  Topic Date Due   Zoster (Shingles) Vaccine (1 of 2) Never done   Flu Shot  06/13/2024   Medicare Annual Wellness Visit  07/08/2025   DTaP/Tdap/Td vaccine (2 - Tdap) 12/15/2030   Pneumococcal Vaccine for age over 68  Completed   HPV Vaccine  Aged Out   Meningitis B Vaccine  Aged Out   COVID-19 Vaccine  Discontinued    Advanced directives: (Copy Requested) Please bring a copy of your health care power of attorney and living will to the office to be added to your chart at your convenience. You can mail to [facility] [address] [place], [place] [address] or email to [email or web address] Advance Care Planning is important because it:  [x]  Makes sure you receive the medical care that is consistent with your values, goals, and preferences  [x]  It provides guidance to your family and loved ones and reduces their decisional burden about whether or not they are making the right decisions based on your wishes.  Follow the  link provided in your after visit summary or read over the paperwork we have mailed to you to help you started getting your Advance Directives in place. If you need assistance in completing these, please reach out to us  so that we can help you!  See attachments for Preventive Care and Fall Prevention Tips.

## 2024-07-09 ENCOUNTER — Ambulatory Visit: Payer: Self-pay | Admitting: Dermatology

## 2024-07-10 ENCOUNTER — Encounter: Payer: Self-pay | Admitting: Dermatology

## 2024-07-10 ENCOUNTER — Ambulatory Visit: Payer: Self-pay | Admitting: Family Medicine

## 2024-07-10 NOTE — Telephone Encounter (Signed)
 Called patient and spoke with patient's daughter Brian [name]. Advised of biopsy results. 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 She voiced understanding to all options and would like to discuss with patient and call us  back with what patient would prefer to proceed with.

## 2024-07-10 NOTE — Telephone Encounter (Signed)
-----   Message from [facility] sent at 07/09/2024  6:15 PM EDT ----- Diagnosis 1. Skin, right lateral neck :       SQUAMOUS CELL CARCINOMA IN SITU, HYPERTROPHIC, BASE INVOLVED        2. Skin, left lower distal anterior leg :       SEBORRHEIC KERATOSIS, INFLAMED        3. Skin, right lower back :       DYSPLASTIC JUNCTIONAL NEVUS WITH MODERATE ATYPIA, INFLAMED, CLOSE TO MARGIN    Please call with diagnosis and message me with patient's decision on treatment.  RIGHT NECK Explanation: Biopsy shows a squamous cell skin cancer limited to the top layer of skin. This means it is an early cancer and has not spread. However, it has the potential to spread beyond the skin  and threaten your health, so we recommend treating it.   Treatment option 1: a cream (fluorouracil and calcipotriene) that helps your immune system clear the skin cancer. It will cause redness and irritation. Wait two weeks after the biopsy to start  applying the cream. Apply the cream twice per day until the redness and irritation develop (usually occurs by day 7), then stop and allow it to heal. We will recheck the area in 2 months to ensure  the cancer is gone. The cream is $45 plus shipping and will be mailed to you from a low cost compounding pharmacy.  Treatment option 2: you return for a brief appointment where I perform electrodesiccation and curettage [facility]). This involves three rounds of scraping and burning to destroy the skin cancer. It has  about an 85% cure rate and leaves a round wound slightly larger than the skin cancer and leaves a round white scar. No additional pathology is done. If the skin cancer comes back, we would need to do  a surgery to remove it.  Treatment option 3: Mohs surgery, which involves cutting out right around the skin cancer and then checking under the microscope on the same day to ensure the whole skin cancer is out. If there is  more cancer remaining, the surgeon will repeat the  process until it is fully cleared. The cure rate is about 98-99%. It is done at another office outside of [place] ([place], [place], or  [place]). Once the Mohs surgeon confirms the skin cancer is out, they will discuss the options to repair or heal the area. You must take it easy for about two weeks after surgery (no lifting over  10-15 lbs, avoid activity to get your heart rate and blood pressure up).  LEFT lower leg Benign keratosis. No treatment needed  RIGHT lower back Atypical mole. No treatment needed. ----- Message ----- From: Interface, Lab In Three Zero One Sent: 07/08/2024   4:24 PM EDT To: Boneta Sharps, MD

## 2024-07-17 ENCOUNTER — Other Ambulatory Visit: Payer: Self-pay

## 2024-07-17 ENCOUNTER — Telehealth: Payer: Self-pay

## 2024-07-17 MED ORDER — FLUOROURACIL 5 % EX CREA: TOPICAL_CREAM | Freq: Two times a day (BID) | CUTANEOUS | 1 refills | Status: AC

## 2024-07-17 MED ORDER — FLUOROURACIL 5 % EX CREA
TOPICAL_CREAM | Freq: Two times a day (BID) | CUTANEOUS | 1 refills | Status: DC
Start: 2024-07-17 — End: 2024-07-17

## 2024-07-17 NOTE — Telephone Encounter (Signed)
 Patient called he would like 5FU/Calcipotriene cream to treat biopsy proven SCCis on the right lateral neck, Discussed 5FU/Calcipotriene cream Apply the cream twice per day until the redness and irritation develop (usually occurs by day 7), then stop and allow it to heal.  Patient aware skin medicinal will call him to set up payment and delivery of this cream

## 2024-07-17 NOTE — Telephone Encounter (Signed)
 Patient left a message with Signa he wanted to do the Cream and not the [facility]. I have called patient and left voicemail to return my call. I would like to confirm information with patient and make him aware of pharmacy information. aw

## 2024-07-22 ENCOUNTER — Ambulatory Visit: Admitting: Dermatology

## 2024-07-31 ENCOUNTER — Ambulatory Visit

## 2024-07-31 ENCOUNTER — Other Ambulatory Visit (HOSPITAL_COMMUNITY)

## 2024-08-14 ENCOUNTER — Ambulatory Visit (HOSPITAL_COMMUNITY)
Admission: RE | Admit: 2024-08-14 | Discharge: 2024-08-14 | Disposition: A | Discharge status: Home / Self Care | Source: Ambulatory Visit | Attending: Vascular Surgery | Admitting: Vascular Surgery

## 2024-08-14 ENCOUNTER — Ambulatory Visit (INDEPENDENT_AMBULATORY_CARE_PROVIDER_SITE_OTHER): Admitting: Physician Assistant

## 2024-08-14 VITALS — BP 142/90 | HR 72 | Temp 98.1°F | Ht 68.0 in | Wt 120.3 lb

## 2024-08-14 DIAGNOSIS — I714 Abdominal aortic aneurysm, without rupture, unspecified: Secondary | ICD-10-CM | POA: Diagnosis not present

## 2024-08-14 NOTE — Progress Notes (Signed)
 Office Note     CC:  follow up Requesting Provider:  Vicci Duwaine SQUIBB, DO  HPI: Brian [name] is a 89 y.o. (05-06-1933) male who presents for surveillance of abdominal aortic aneurysm.  He is accompanied today by his daughter.  He denies any new or changing abdominal or back pain.  He walks for exercise on a daily basis.  He follows regularly with his PCP.  He denies tobacco use.   Past Medical History:  Diagnosis Date   AAA (abdominal aortic aneurysm)    2.9 CM infrarenal AAA on CT 11/2011   Alcoholism (HCC)    Anxiety    panic attacks, rare.   Basal cell carcinoma 06/21/2022   Right helix. 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    Dysplastic nevus 07/03/2024   right lower back DYSPLASTIC JUNCTIONAL NEVUS WITH MODERATE ATYPIA,   Gastritis 03/2012   GERD (gastroesophageal reflux disease)    GI bleed    HFrEF (heart failure with reduced ejection fraction) (HCC)    HOH (hard of hearing)    Hyperlipemia    Hypertension    Hypovolemic shock (HCC)    Kidney stones    Melanoma in situ (HCC) 06/21/2022   Right preauricular, Mohs 07/19/22   Myocardial infarction (HCC) 2015   Panic attacks    Positive H. pylori titer ?, before 2010   treated with Abx.    Squamous cell carcinoma in situ (SCCIS) 07/03/2024   right lateral neck- needs scheduling   Squamous cell carcinoma of skin 12/28/2021   SCC IS, R medial calf superior, EDC 01/23/22   Squamous cell carcinoma of skin 12/28/2021   SCC IS, R medial calf inferior, EDC 01/23/22   Tubular adenoma of colon     Past Surgical History:  Procedure Laterality Date   CARDIAC CATHETERIZATION  2015   Stents   CATARACT EXTRACTION W/PHACO Right 09/21/2016   Procedure: CATARACT EXTRACTION PHACO AND INTRAOCULAR LENS PLACEMENT (IOC);  Surgeon: Adine Oneil Novak, MD;  Location: [facility] ORS;  Service:  Ophthalmology;  Laterality: Right;  US  1.10 AP% 13.8 CDE 9.93 Fluid pack lot # [id number]   CATARACT EXTRACTION W/PHACO Left 12/07/2016   Procedure: CATARACT EXTRACTION PHACO AND INTRAOCULAR LENS PLACEMENT (IOC);  Surgeon: Adine Oneil Novak, MD;  Location: [facility] ORS;  Service: Ophthalmology;  Laterality: Left;  us  00:42.7 ap 13.7 cde 5.85 lot #[id number]   COLONOSCOPY N/A 05/05/2024   Procedure: COLONOSCOPY;  Surgeon: Jinny Carmine, MD;  Location: [facility] ENDOSCOPY;  Service: Endoscopy;  Laterality: N/A;   COLONOSCOPY WITH PROPOFOL  Left 06/06/2015   Procedure: COLONOSCOPY WITH PROPOFOL ;  Surgeon: Deward CINDERELLA Piedmont, MD;  Location: [facility] ENDOSCOPY;  Service: Endoscopy;  Laterality: Left;   CORONARY ANGIOPLASTY     2015   LEFT HEART CATHETERIZATION WITH CORONARY ANGIOGRAM Bilateral 05/29/2014   Procedure: LEFT HEART CATHETERIZATION WITH CORONARY ANGIOGRAM;  Surgeon: Erick JONELLE Bergamo, MD;  Location[facility] CATH LAB;  Service: Cardiovascular;  Laterality: Bilateral;   PERCUTANEOUS CORONARY STENT INTERVENTION (PCI-S)  05/29/2014   Procedure: PERCUTANEOUS CORONARY STENT INTERVENTION (PCI-S);  Surgeon: Erick JONELLE Bergamo, MD;  Location: [facility] CATH LAB;  Service: Cardiovascular;;  DES x2 Prox and Mid LAD     Social History   Socioeconomic History   Marital status: Married    Spouse name: Candis   Number of children: 4  Years of education: Not on file   Highest education level: 12th grade  Occupational History   Occupation: semi retired  Tobacco Use   Smoking status: Former    Current packs/day: 0.25    Average packs/day: 0.3 packs/day for 57.5 years (14.4 ttl pk-yrs)    Types: Cigarettes, Cigars    Start date: 06/1966    Quit date: 03/2023   Smokeless tobacco: Never   Tobacco comments:    4 cigarettes per day, restarted and quit ~03/2023    4 cigarettes/day, 2025  Vaping Use   Vaping status: Never Used  Substance and Sexual Activity   Alcohol use: No    Alcohol/week: 0.0 standard drinks of alcohol   Drug use:  No   Sexual activity: Not Currently  Other Topics Concern   Not on file  Social History Narrative   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    Social Drivers of Health   Financial Resource Strain: Low Risk  (07/08/2024)   Overall Financial Resource Strain (CARDIA)    Difficulty of Paying Living Expenses: Not hard at all  Food Insecurity: No Food Insecurity (07/08/2024)   Hunger Vital Sign    Worried About Running Out of Food in the Last Year: Never true    Ran Out of Food in the Last Year: Never true  Transportation Needs: No Transportation Needs (07/08/2024)   PRAPARE - Administrator, Civil Service (Medical): No    Lack of Transportation (Non-Medical): No  Physical Activity: Inactive (07/08/2024)   Exercise Vital Sign    Days of Exercise per Week: 0 days    Minutes of Exercise per Session: 0 min  Stress: No Stress Concern Present (07/08/2024)   [employer] of Occupational Health - Occupational Stress Questionnaire    Feeling of Stress: Only a little  Social Connections: Moderately Integrated (07/08/2024)   Social Connection and Isolation Panel    Frequency of Communication with Friends and Family: More than three times a week    Frequency of Social Gatherings with Friends and Family: More than three times a week    Attends Religious Services: 1 to 4 times per year    Active Member of [employer] or Organizations: No    Attends Banker Meetings: Never    Marital Status: Married  Catering manager Violence: Not At Risk (07/08/2024)   Humiliation, Afraid, Rape, and Kick questionnaire    Fear of Current or Ex-Partner: No    Emotionally Abused: No    Physically Abused: No    Sexually Abused: No    Family History  Problem Relation Age of Onset   Heart disease Mother    Ulcers Father        stomach    Heart disease Father    Anuerysm Brother    Leukemia Brother    Heart disease Paternal Grandmother    Heart attack Paternal Grandmother    Heart  disease Paternal Grandfather    Hypertension Daughter    Colon cancer Neg Hx     Current Outpatient Medications  Medication Sig Dispense Refill   aspirin  EC 81 MG tablet Take 1 tablet (81 mg total) by mouth daily. Swallow whole. 90 tablet 3   atorvastatin  (LIPITOR ) 80 MG tablet TAKE 1 TABLET BY MOUTH ONCE DAILY AT  6PM 90 tablet 1   clopidogrel  (PLAVIX ) 75 MG tablet Take 75 mg by mouth daily.     diazepam  (VALIUM ) 5 MG tablet Take 5  mg by mouth every 8 (eight) hours as needed (for anxiety).     fluorouracil  (EFUDEX ) 5 % cream Apply topically 2 (two) times daily. Apply the neck twice per day until the redness and irritation develop (usually occurs by day 7), then stop and allow it to heal. 30 g 1   levothyroxine  (SYNTHROID ) 25 MCG tablet Take 1 tablet (25 mcg total) by mouth daily. 90 tablet 3   losartan  (COZAAR ) 25 MG tablet Take 0.5 tablets (12.5 mg total) by mouth daily. 45 tablet 1   metoprolol  succinate (TOPROL -XL) 25 MG 24 hr tablet Take 1 tablet (25 mg total) by mouth daily. 90 tablet 1   mupirocin  ointment (BACTROBAN ) 2 % Apply 1 Application topically daily. 22 g 0   nitroGLYCERIN  (NITROSTAT ) 0.4 MG SL tablet PLACE ONE TABLET UNDER THE TONGUE EVERY 5 MINUTES AS NEEDED FOR CHEST PAIN 25 tablet 12   sucralfate  (CARAFATE ) 1 g tablet Take 1 tablet (1 g total) by mouth 4 (four) times daily -  with meals and at bedtime. 120 tablet 1   traMADol  (ULTRAM ) 50 MG tablet Take 1 tablet (50 mg total) by mouth every 4 (four) hours as needed. 60 tablet 0   Vitamin D , Ergocalciferol , (DRISDOL ) 1.25 MG (50000 UNIT) CAPS capsule Take 1 capsule (50,000 Units total) by mouth every 7 (seven) days. 12 capsule 1   No current facility-administered medications for this visit.    Allergies  Allergen Reactions   Zoloft  [Sertraline  Hcl] Nausea Only    Patient states that he was not himself when he took this drug     REVIEW OF SYSTEMS:  Negative unless noted in HPI [X]  denotes positive finding, [ ]  denotes  negative finding Cardiac  Comments:  Chest pain or chest pressure:    Shortness of breath upon exertion:    Short of breath when lying flat:    Irregular heart rhythm:        Vascular    Pain in calf, thigh, or hip brought on by ambulation:    Pain in feet at night that wakes you up from your sleep:     Blood clot in your veins:    Leg swelling:         Pulmonary    Oxygen at home:    Productive cough:     Wheezing:         Neurologic    Sudden weakness in arms or legs:     Sudden numbness in arms or legs:     Sudden onset of difficulty speaking or slurred speech:    Temporary loss of vision in one eye:     Problems with dizziness:         Gastrointestinal    Blood in stool:     Vomited blood:         Genitourinary    Burning when urinating:     Blood in urine:        Psychiatric    Major depression:         Hematologic    Bleeding problems:    Problems with blood clotting too easily:        Skin    Rashes or ulcers:        Constitutional    Fever or chills:      PHYSICAL EXAMINATION:  Vitals:   08/14/24 0923  BP: (!) 142/90  Pulse: 72  Temp: 98.1 F (36.7 C)  TempSrc: Temporal  Weight: 120 lb 4.8 oz (  54.6 kg)  Height: 5' 8 (1.727 m)    General:  WDWN in NAD; vital signs documented above Gait: Not observed HENT: WNL, normocephalic Pulmonary: normal non-labored breathing Cardiac: regular HR Abdomen: soft, NT, no masses Skin: without rashes Extremities: without ischemic changes, without Gangrene , without cellulitis; without open wounds;  Musculoskeletal: no muscle wasting or atrophy  Neurologic: A&O X 3 Psychiatric:  The pt has Normal affect.   Non-Invasive Vascular Imaging:    AAA measures 4.8cm at the largest diameter    ASSESSMENT/PLAN:: 89 y.o. male here for follow up for surveillance of AAA  Subjectively, Mr. Ashworth is doing well.  He has not had any new or changing abdominal or back pain.  AAA is relatively stable measuring 4.8 cm  at its largest diameter.  We will continue surveillance every 6 months.  If AAA grows to a size of 5.5 cm we will order a CTA and discuss surgical treatment with Dr. Lanis.  The patient and his daughter are agreeable to this plan.  He knows to notify the office with any questions or concerns.   Donnice Sender, PA-C [facility] [phone]  Clinic MD:   [place]

## 2024-08-15 ENCOUNTER — Other Ambulatory Visit: Payer: Self-pay | Admitting: Vascular Surgery

## 2024-08-15 DIAGNOSIS — I714 Abdominal aortic aneurysm, without rupture, unspecified: Secondary | ICD-10-CM

## 2024-08-29 ENCOUNTER — Telehealth: Payer: Self-pay

## 2024-08-29 NOTE — Telephone Encounter (Unsigned)
 Copied from CRM #[id number]. Topic: Clinical - Medication Question >> Aug 29, 2024 10:20 AM Delon DASEN wrote: Reason for CRM: daughter Olam asking if patient needs pneumonia shot- [phone]

## 2024-09-18 ENCOUNTER — Ambulatory Visit: Admitting: Dermatology

## 2024-09-18 ENCOUNTER — Encounter: Payer: Self-pay | Admitting: Dermatology

## 2024-09-18 DIAGNOSIS — Z86007 Personal history of in-situ neoplasm of skin: Secondary | ICD-10-CM

## 2024-09-18 DIAGNOSIS — Z09 Encounter for follow-up examination after completed treatment for conditions other than malignant neoplasm: Secondary | ICD-10-CM

## 2024-09-18 NOTE — Progress Notes (Signed)
   Follow-Up Visit   Subjective  Brian [name] is a 89 y.o. male who presents for the following: recheck SCC IS R lat neck, pt used 5FU/Calcipotriene until redness and irritation developed    The following portions of the chart were reviewed this encounter and updated as appropriate: medications, allergies, medical history  Review of Systems:  No other skin or systemic complaints except as noted in HPI or Assessment and Plan.  Objective  Well appearing patient in no apparent distress; mood and affect are within normal limits.   A focused examination was performed of the following areas: Right neck  Relevant exam findings are noted in the Assessment and Plan.    Assessment & Plan   SQUAMOUS CELL CARCINOMA IN SITU Bx proven S/P 5FU/Calcipotriene treatment R lat neck Exam: clear today  Treatment Plan: Good results with 5FU/Calcipotriene cream Observe for recurrence   HISTORY OF SQUAMOUS CELL CARCINOMA IN SITU (SCCIS)    Return for as scheduled for TBSE, Hx of Melanoma, Hx of BCC, Hx of SCC IS, Hx of Dysplastic nevi, Hx of AKs.  I, Grayce Saunas, RMA, am acting as scribe for Boneta Sharps, MD .   Documentation: I have reviewed the above documentation for accuracy and completeness, and I agree with the above.  Boneta Sharps, MD

## 2024-09-18 NOTE — Patient Instructions (Signed)

## 2024-10-07 ENCOUNTER — Encounter: Payer: Self-pay | Admitting: Cardiology

## 2024-10-08 ENCOUNTER — Encounter: Payer: Self-pay | Admitting: Family Medicine

## 2024-10-08 ENCOUNTER — Ambulatory Visit: Admitting: Family Medicine

## 2024-10-08 VITALS — BP 138/86 | HR 64 | Temp 98.0°F | Ht 68.0 in | Wt 121.2 lb

## 2024-10-08 DIAGNOSIS — K219 Gastro-esophageal reflux disease without esophagitis: Secondary | ICD-10-CM | POA: Diagnosis not present

## 2024-10-08 DIAGNOSIS — I2510 Atherosclerotic heart disease of native coronary artery without angina pectoris: Secondary | ICD-10-CM | POA: Diagnosis not present

## 2024-10-08 DIAGNOSIS — I129 Hypertensive chronic kidney disease with stage 1 through stage 4 chronic kidney disease, or unspecified chronic kidney disease: Secondary | ICD-10-CM

## 2024-10-08 DIAGNOSIS — I119 Hypertensive heart disease without heart failure: Secondary | ICD-10-CM | POA: Diagnosis not present

## 2024-10-08 MED ORDER — SUCRALFATE 1 G PO TABS: 1.0000 g | ORAL_TABLET | Freq: Three times a day (TID) | ORAL | 1 refills | Status: AC

## 2024-10-08 MED ORDER — ATORVASTATIN CALCIUM 80 MG PO TABS: ORAL_TABLET | ORAL | 0 refills | Status: AC

## 2024-10-08 MED ORDER — METOPROLOL SUCCINATE ER 25 MG PO TB24: 25.0000 mg | ORAL_TABLET | Freq: Every day | ORAL | 0 refills | Status: AC

## 2024-10-08 MED ORDER — LOSARTAN POTASSIUM 25 MG PO TABS: 12.5000 mg | ORAL_TABLET | Freq: Every day | ORAL | 0 refills | Status: AC

## 2024-10-08 NOTE — Progress Notes (Signed)
 BP 138/86   Pulse 64   Temp 98 F (36.7 C) (Oral)   Ht 5' 8 (1.727 m)   Wt 121 lb 3.2 oz (55 kg)   SpO2 95%   BMI 18.43 kg/m    Subjective:    Patient ID: Brian [name], male    DOB: [birth year], 89 y.o.   MRN: [id number]  HPI: Brian [name] is a 89 y.o. male  Chief Complaint  Patient presents with   Hypertension   HYPERTENSION  Hypertension status: controlled  Satisfied with current treatment? yes Duration of hypertension: chronic BP monitoring frequency:  not checking BP medication side effects:  no Medication compliance: excellent compliance Previous BP meds:metoprolol , losartan  Aspirin : yes Recurrent headaches: no Visual changes: no Palpitations: no Dyspnea: no Chest pain: no Lower extremity edema: no Dizzy/lightheaded: no  GERD GERD control status: better Satisfied with current treatment? yes Heartburn frequency: rarely Medication side effects: no  Medication compliance: excellent Dysphagia: no Odynophagia:  no Hematemesis: no Blood in stool: no EGD: no  Relevant past medical, surgical, family and social history reviewed and updated as indicated. Interim medical history since our last visit reviewed. Allergies and medications reviewed and updated.  Review of Systems  Constitutional: Negative.   Respiratory: Negative.    Cardiovascular: Negative.   Musculoskeletal: Negative.   Neurological: Negative.   Psychiatric/Behavioral: Negative.      Per HPI unless specifically indicated above     Objective:    BP 138/86   Pulse 64   Temp 98 F (36.7 C) (Oral)   Ht 5' 8 (1.727 m)   Wt 121 lb 3.2 oz (55 kg)   SpO2 95%   BMI 18.43 kg/m   Wt Readings from Last 3 Encounters:  10/08/24 121 lb 3.2 oz (55 kg)  08/14/24 120 lb 4.8 oz (54.6 kg)  07/08/24 119 lb (54 kg)    Physical Exam Vitals and nursing note reviewed.  Constitutional:      General: He is not in acute distress.    Appearance: Normal appearance. He is not ill-appearing,  toxic-appearing or diaphoretic.  HENT:     Head: Normocephalic and atraumatic.     Right Ear: External ear normal.     Left Ear: External ear normal.     Nose: Nose normal.     Mouth/Throat:     Mouth: Mucous membranes are moist.     Pharynx: Oropharynx is clear.  Eyes:     General: No scleral icterus.       Right eye: No discharge.        Left eye: No discharge.     Extraocular Movements: Extraocular movements intact.     Conjunctiva/sclera: Conjunctivae normal.     Pupils: Pupils are equal, round, and reactive to light.  Cardiovascular:     Rate and Rhythm: Normal rate and regular rhythm.     Pulses: Normal pulses.     Heart sounds: Normal heart sounds. No murmur heard.    No friction rub. No gallop.  Pulmonary:     Effort: Pulmonary effort is normal. No respiratory distress.     Breath sounds: Normal breath sounds. No stridor. No wheezing, rhonchi or rales.  Chest:     Chest wall: No tenderness.  Musculoskeletal:        General: Normal range of motion.     Cervical back: Normal range of motion and neck supple.  Skin:    General: Skin is warm and dry.     Capillary  Refill: Capillary refill takes less than 2 seconds.     Coloration: Skin is not jaundiced or pale.     Findings: No bruising, erythema, lesion or rash.  Neurological:     General: No focal deficit present.     Mental Status: He is alert and oriented to person, place, and time. Mental status is at baseline.  Psychiatric:        Mood and Affect: Mood normal.        Behavior: Behavior normal.        Thought Content: Thought content normal.        Judgment: Judgment normal.     Results for orders placed or performed in visit on 07/07/24  CBC with Differential/Platelet   Collection Time: 07/07/24  8:47 AM  Result Value Ref Range   WBC 6.0 3.4 - 10.8 x10E3/uL   RBC 4.26 4.14 - 5.80 x10E6/uL   Hemoglobin 13.8 13.0 - 17.7 g/dL   Hematocrit 58.0 62.4 - 51.0 %   MCV 98 (H) 79 - 97 fL   MCH 32.4 26.6 - 33.0 pg    MCHC 32.9 31.5 - 35.7 g/dL   RDW 87.6 88.3 - 84.5 %   Platelets 157 150 - 450 x10E3/uL   Neutrophils 50 Not Estab. %   Lymphs 38 Not Estab. %   Monocytes 8 Not Estab. %   Eos 3 Not Estab. %   Basos 1 Not Estab. %   Neutrophils Absolute 3.0 1.4 - 7.0 x10E3/uL   Lymphocytes Absolute 2.3 0.7 - 3.1 x10E3/uL   Monocytes Absolute 0.5 0.1 - 0.9 x10E3/uL   EOS (ABSOLUTE) 0.2 0.0 - 0.4 x10E3/uL   Basophils Absolute 0.0 0.0 - 0.2 x10E3/uL   Immature Granulocytes 0 Not Estab. %   Immature Grans (Abs) 0.0 0.0 - 0.1 x10E3/uL  Lipid Panel w/o Chol/HDL Ratio   Collection Time: 07/07/24  8:47 AM  Result Value Ref Range   Cholesterol, Total 151 100 - 199 mg/dL   Triglycerides 76 0 - 149 mg/dL   HDL 85 >60 mg/dL   VLDL Cholesterol Cal 15 5 - 40 mg/dL   LDL Chol Calc (NIH) 51 0 - 99 mg/dL  Comprehensive metabolic panel with GFR   Collection Time: 07/07/24  8:47 AM  Result Value Ref Range   Glucose 112 (H) 70 - 99 mg/dL   BUN 5 (L) 8 - 27 mg/dL   Creatinine, Ser 9.16 0.76 - 1.27 mg/dL   eGFR 84 >40 fO/fpw/8.26   BUN/Creatinine Ratio 6 (L) 10 - 24   Sodium 141 134 - 144 mmol/L   Potassium 4.0 3.5 - 5.2 mmol/L   Chloride 104 96 - 106 mmol/L   CO2 25 20 - 29 mmol/L   Calcium  9.4 8.6 - 10.2 mg/dL   Total Protein 6.7 6.0 - 8.5 g/dL   Albumin 4.1 3.7 - 4.7 g/dL   Globulin, Total 2.6 1.5 - 4.5 g/dL   Bilirubin Total 0.5 0.0 - 1.2 mg/dL   Alkaline Phosphatase 78 44 - 121 IU/L   AST 14 0 - 40 IU/L   ALT 9 0 - 44 IU/L  TSH   Collection Time: 07/07/24  8:47 AM  Result Value Ref Range   TSH 4.340 0.450 - 4.500 uIU/mL      Assessment & Plan:   Problem List Items Addressed This Visit       Cardiovascular and Mediastinum   CAD (coronary artery disease)   Relevant Medications   atorvastatin  (LIPITOR ) 80 MG tablet  losartan  (COZAAR ) 25 MG tablet   metoprolol  succinate (TOPROL -XL) 25 MG 24 hr tablet   Hypertension with heart disease   Relevant Medications   atorvastatin  (LIPITOR ) 80 MG  tablet   losartan  (COZAAR ) 25 MG tablet   metoprolol  succinate (TOPROL -XL) 25 MG 24 hr tablet     Digestive   GERD (gastroesophageal reflux disease) - Primary   Doing much better. Take carafate  as needed. Call with any concerns.       Relevant Medications   sucralfate  (CARAFATE ) 1 g tablet     Genitourinary   Benign hypertensive renal disease   Better on recheck. Continue current regimen. Continue to monitor. Call with any concerns.         Follow up plan: Return in about 3 months (around 01/08/2025) for physical.

## 2024-10-08 NOTE — Assessment & Plan Note (Signed)
 Doing much better. Take carafate  as needed. Call with any concerns.

## 2024-10-08 NOTE — Assessment & Plan Note (Signed)
Better on recheck. Continue current regimen. Continue to monitor. Call with any concerns.  

## 2024-12-15 ENCOUNTER — Telehealth: Payer: Self-pay

## 2025-01-08 ENCOUNTER — Ambulatory Visit: Admitting: Dermatology

## 2025-01-13 ENCOUNTER — Encounter: Admitting: Family Medicine

## 2025-02-12 ENCOUNTER — Ambulatory Visit

## 2025-02-12 ENCOUNTER — Other Ambulatory Visit (HOSPITAL_COMMUNITY)

## 2025-03-17 ENCOUNTER — Ambulatory Visit: Admitting: Cardiology

## 2025-07-09 ENCOUNTER — Ambulatory Visit
